# Patient Record
Sex: Female | Born: 1935 | Race: White | Hispanic: No | Smoking: Never smoker
Health system: Southern US, Community
[De-identification: ages and names within clinical notes are randomized; demographics above are authoritative.]

## PROBLEM LIST (undated history)

## (undated) DIAGNOSIS — E7800 Pure hypercholesterolemia, unspecified: Secondary | ICD-10-CM

## (undated) DIAGNOSIS — I6930 Unspecified sequelae of cerebral infarction: Secondary | ICD-10-CM

## (undated) DIAGNOSIS — Z6821 Body mass index (BMI) 21.0-21.9, adult: Secondary | ICD-10-CM

## (undated) DIAGNOSIS — E663 Overweight: Secondary | ICD-10-CM

## (undated) DIAGNOSIS — E785 Hyperlipidemia, unspecified: Secondary | ICD-10-CM

## (undated) DIAGNOSIS — I509 Heart failure, unspecified: Secondary | ICD-10-CM

## (undated) DIAGNOSIS — E119 Type 2 diabetes mellitus without complications: Secondary | ICD-10-CM

## (undated) DIAGNOSIS — M1990 Unspecified osteoarthritis, unspecified site: Secondary | ICD-10-CM

## (undated) DIAGNOSIS — I10 Essential (primary) hypertension: Secondary | ICD-10-CM

## (undated) DIAGNOSIS — M353 Polymyalgia rheumatica: Secondary | ICD-10-CM

## (undated) DIAGNOSIS — M316 Other giant cell arteritis: Secondary | ICD-10-CM

## (undated) HISTORY — DX: Body mass index (BMI) 21.0-21.9, adult: Z68.21

## (undated) HISTORY — DX: Unspecified osteoarthritis, unspecified site: M19.90

## (undated) HISTORY — DX: Heart failure, unspecified: I50.9

## (undated) HISTORY — DX: Hyperlipidemia, unspecified: E78.5

## (undated) HISTORY — DX: Overweight: E66.3

## (undated) HISTORY — DX: Unspecified sequelae of cerebral infarction: I69.30

## (undated) HISTORY — DX: Polymyalgia rheumatica: M35.3

## (undated) HISTORY — DX: Other giant cell arteritis: M31.6

## (undated) HISTORY — DX: Type 2 diabetes mellitus without complications: E11.9

---

## 1987-02-20 HISTORY — PX: ANKLE SURGERY: SHX546

## 1989-02-19 HISTORY — PX: ABDOMINAL HYSTERECTOMY: SHX81

## 2001-05-19 ENCOUNTER — Ambulatory Visit (HOSPITAL_COMMUNITY): Admission: RE | Admit: 2001-05-19 | Discharge: 2001-05-19 | Payer: Self-pay | Admitting: Family Medicine

## 2001-05-19 ENCOUNTER — Encounter: Payer: Self-pay | Admitting: Family Medicine

## 2002-06-10 ENCOUNTER — Ambulatory Visit (HOSPITAL_COMMUNITY): Admission: RE | Admit: 2002-06-10 | Discharge: 2002-06-10 | Payer: Self-pay | Admitting: Family Medicine

## 2002-06-10 ENCOUNTER — Encounter: Payer: Self-pay | Admitting: Family Medicine

## 2003-06-14 ENCOUNTER — Ambulatory Visit (HOSPITAL_COMMUNITY): Admission: RE | Admit: 2003-06-14 | Discharge: 2003-06-14 | Payer: Self-pay | Admitting: Family Medicine

## 2004-11-27 ENCOUNTER — Ambulatory Visit (HOSPITAL_COMMUNITY): Admission: RE | Admit: 2004-11-27 | Discharge: 2004-11-27 | Payer: Self-pay | Admitting: Family Medicine

## 2005-12-13 ENCOUNTER — Ambulatory Visit (HOSPITAL_COMMUNITY): Admission: RE | Admit: 2005-12-13 | Discharge: 2005-12-13 | Payer: Self-pay | Admitting: Family Medicine

## 2006-06-10 ENCOUNTER — Ambulatory Visit (HOSPITAL_COMMUNITY): Admission: RE | Admit: 2006-06-10 | Discharge: 2006-06-10 | Payer: Self-pay | Admitting: Ophthalmology

## 2006-06-10 HISTORY — PX: CATARACT EXTRACTION W/ INTRAOCULAR LENS IMPLANT: SHX1309

## 2006-12-02 ENCOUNTER — Ambulatory Visit (HOSPITAL_COMMUNITY): Admission: RE | Admit: 2006-12-02 | Discharge: 2006-12-02 | Payer: Self-pay | Admitting: Family Medicine

## 2007-02-25 ENCOUNTER — Ambulatory Visit (HOSPITAL_COMMUNITY): Admission: RE | Admit: 2007-02-25 | Discharge: 2007-02-25 | Payer: Self-pay | Admitting: Family Medicine

## 2008-03-10 ENCOUNTER — Ambulatory Visit (HOSPITAL_COMMUNITY): Admission: RE | Admit: 2008-03-10 | Discharge: 2008-03-10 | Payer: Self-pay | Admitting: Family Medicine

## 2009-07-14 ENCOUNTER — Ambulatory Visit (HOSPITAL_COMMUNITY): Admission: RE | Admit: 2009-07-14 | Discharge: 2009-07-14 | Payer: Self-pay | Admitting: Family Medicine

## 2010-03-12 ENCOUNTER — Encounter: Payer: Self-pay | Admitting: Family Medicine

## 2010-06-20 ENCOUNTER — Ambulatory Visit (HOSPITAL_COMMUNITY)
Admission: RE | Admit: 2010-06-20 | Discharge: 2010-06-20 | Disposition: A | Discharge status: Home / Self Care | Payer: Medicare Other | Source: Ambulatory Visit | Attending: General Surgery | Admitting: General Surgery

## 2010-06-20 DIAGNOSIS — Z1211 Encounter for screening for malignant neoplasm of colon: Secondary | ICD-10-CM | POA: Insufficient documentation

## 2010-06-20 DIAGNOSIS — Z79899 Other long term (current) drug therapy: Secondary | ICD-10-CM | POA: Insufficient documentation

## 2010-06-20 DIAGNOSIS — D126 Benign neoplasm of colon, unspecified: Secondary | ICD-10-CM | POA: Insufficient documentation

## 2010-06-20 DIAGNOSIS — E119 Type 2 diabetes mellitus without complications: Secondary | ICD-10-CM | POA: Insufficient documentation

## 2010-06-20 DIAGNOSIS — I10 Essential (primary) hypertension: Secondary | ICD-10-CM | POA: Insufficient documentation

## 2010-06-20 DIAGNOSIS — E785 Hyperlipidemia, unspecified: Secondary | ICD-10-CM | POA: Insufficient documentation

## 2010-06-20 HISTORY — PX: COLONOSCOPY W/ POLYPECTOMY: SHX1380

## 2010-06-20 LAB — GLUCOSE, CAPILLARY: Glucose-Capillary: 157 mg/dL — ABNORMAL HIGH (ref 70–99)

## 2010-06-21 NOTE — Op Note (Signed)
  NAMEOHANA, BIRDWELL NO.:  [id number]  MEDICAL RECORD NO.:  [id number]           PATIENT TYPE:  O  LOCATION:  DAYP                          FACILITY:  [facility]  PHYSICIAN:  Dalia Heading, M.D.  DATE OF BIRTH:  01/27/1935  DATE OF PROCEDURE:  06/20/2010 DATE OF DISCHARGE:                              OPERATIVE REPORT   PREOPERATIVE DIAGNOSIS:  Need for screening colonoscopy.  POSTOPERATIVE DIAGNOSES:  Need for screening colonoscopy, colon polyps in descending colon.  PROCEDURE:  Colonoscopy with snare polypectomy.  SURGEON:  Dalia Heading, MD  ANESTHESIA:  Demerol 50 mg IV and Versed 3 mg IV.  INDICATIONS:  The patient is a 76 year old white female who presents for screening colonoscopy.  The risks and benefits of the procedure including bleeding, infection, and the possibility of perforation were fully explained to the patient, gave informed consent.  PROCEDURE NOTE:  The patient was placed in the left lateral decubitus position after placement of nasal cannula, pulse oximetry, noninvasive blood pressure monitoring.  Demerol and Versed were used throughout the procedure for anesthesia.  A rectal examination was performed which was negative.  The colonoscope was advanced to the cecum with mild difficulty secondary to tortuosity of the colon.  The bowel preparation was adequate.  Confirmation of placement to the cecum was done using transabdominal palpation and landmarks.  The cecum, ascending colon, transverse colon, and proximal descending colon regions were all within normal limits.  No abnormal lesions were noted.  At approximately 30-35 cm from the anus, a small polyp was seen and removed using the snare cautery.  A larger polyp was also seen and removed in the same region.  This was removed using snare cautery.  The were sent to Pathology for further examination.  There was no abnormal bleeding after the polypectomies.  The rest of the descending  colon and rectum were within normal limits.  No abnormal lesions were noted.  The colonoscope was removed from the colon and rectum after evacuation of air.  The patient tolerated the procedure well and was transferred back to PACU in stable condition.  COMPLICATIONS:  None.  SPECIMEN:  Colon polyps x2, 30 to 35 cm from the anus.  RECOMMENDATIONS:  Recommendations are pending from the results of pathology.  A followup colonoscopy will be done in 3 years.     Dalia Heading, M.D.     MAJ/MEDQ  D:  06/20/2010  T:  06/20/2010  Job:  [id number]  cc:   Corrie Mckusick, M.D. Fax: [phone]  Electronically Signed by Franky Macho M.D. on 06/21/2010 02:54:27 PM

## 2010-06-21 NOTE — H&P (Signed)
  NAMESHARI, NATT NO.:  [id number]  MEDICAL RECORD NO.:  [id number]           PATIENT TYPE:  LOCATION:                                 FACILITY:  PHYSICIAN:  Dalia Heading, M.D.  DATE OF BIRTH:  1934/08/25  DATE OF ADMISSION: DATE OF DISCHARGE:  LH                             HISTORY & PHYSICAL   CHIEF COMPLAINT:  Need for screening colonoscopy.  HISTORY OF PRESENT ILLNESS:  The patient is a 76 year old white female, who is referred for endoscopic evaluation.  She needs a colonoscopy for screening purposes.  No abdominal pain, weight loss, nausea, vomiting, diarrhea, constipation, melena, hematochezia have been noted.  She last had a colonoscopy over 12 years ago.  There is no family history of colon carcinoma.  PAST MEDICAL HISTORY:  Includes, hypertension, high cholesterol levels, non-insulin-dependent diabetes mellitus.  PAST SURGICAL HISTORY:  Hysterectomy, ankle surgery.  CURRENT MEDICATIONS:  Norvasc, simvastatin, losartan, metformin.  ALLERGIES:  No known drug allergies.  REVIEW OF SYSTEMS:  The patient denies drinking or smoking.  She denies any other cardiopulmonary difficulties or bleeding disorders.  FAMILY MEDICAL HISTORY:  Noncontributory.  PHYSICAL EXAMINATION:  GENERAL:  The patient is a well-developed, well- nourished white female, in no acute distress. LUNGS:  Clear to auscultation with equal breath sounds bilaterally. HEART:  Reveals regular rate and rhythm without S3, S4, or murmurs. ABDOMEN:  Soft, nontender, nondistended.  No hepatosplenomegaly or masses are noted. RECTAL:  Deferred to the procedure.  IMPRESSION:  Need for screening colonoscopy.  PLAN:  The patient is scheduled for colonoscopy on Jun 20, 2010.  Risks and benefits of the procedure including bleeding and perforation were fully explained to the patient, gave informed consent.     Dalia Heading, M.D.     MAJ/MEDQ  D:  06/06/2010  T:  06/07/2010   Job:  [id number]  cc:   Corrie Mckusick, M.D. Fax: [phone]  Electronically Signed by Franky Macho M.D. on 06/21/2010 02:29:19 PM

## 2010-07-18 ENCOUNTER — Other Ambulatory Visit (HOSPITAL_COMMUNITY): Payer: Self-pay | Admitting: Family Medicine

## 2010-07-18 DIAGNOSIS — Z139 Encounter for screening, unspecified: Secondary | ICD-10-CM

## 2010-07-24 ENCOUNTER — Ambulatory Visit (HOSPITAL_COMMUNITY)
Admission: RE | Admit: 2010-07-24 | Discharge: 2010-07-24 | Disposition: A | Discharge status: Home / Self Care | Payer: Medicare Other | Source: Ambulatory Visit | Attending: Family Medicine | Admitting: Family Medicine

## 2010-07-24 DIAGNOSIS — Z1231 Encounter for screening mammogram for malignant neoplasm of breast: Secondary | ICD-10-CM | POA: Insufficient documentation

## 2010-07-24 DIAGNOSIS — Z139 Encounter for screening, unspecified: Secondary | ICD-10-CM

## 2010-09-12 ENCOUNTER — Encounter (HOSPITAL_COMMUNITY)
Admission: RE | Admit: 2010-09-12 | Discharge: 2010-09-12 | Disposition: A | Discharge status: Home / Self Care | Payer: Medicare Other | Source: Ambulatory Visit | Attending: Ophthalmology | Admitting: Ophthalmology

## 2010-09-12 ENCOUNTER — Encounter (HOSPITAL_COMMUNITY): Payer: Self-pay

## 2010-09-12 ENCOUNTER — Other Ambulatory Visit: Payer: Self-pay

## 2010-09-12 HISTORY — DX: Type 2 diabetes mellitus without complications: E11.9

## 2010-09-12 HISTORY — DX: Pure hypercholesterolemia, unspecified: E78.00

## 2010-09-12 HISTORY — DX: Essential (primary) hypertension: I10

## 2010-09-12 LAB — CBC
HCT: 38.5 % (ref 36.0–46.0)
Hemoglobin: 13.5 g/dL (ref 12.0–15.0)
MCH: 33.8 pg (ref 26.0–34.0)
RBC: 3.99 MIL/uL (ref 3.87–5.11)

## 2010-09-12 LAB — BASIC METABOLIC PANEL
GFR calc non Af Amer: >60 mL/min (ref >60)
Glucose, Bld: 152 mg/dL — ABNORMAL HIGH (ref 70–99)
Potassium: 4.1 mEq/L (ref 3.5–5.1)

## 2010-09-12 MED ORDER — CYCLOPENTOLATE-PHENYLEPHRINE 0.2-1 % OP SOLN
OPHTHALMIC | Status: AC
  Filled 2010-09-12: qty 2

## 2010-09-12 NOTE — Patient Instructions (Addendum)
[name]  09/12/2010   Your procedure is scheduled on:  Monday, 09/18/10  Report to [facility] [place] [facility].  Call this number if you have problems the morning of surgery: [phone]   Remember:   Do not eat food:After Midnight.  Do not drink clear liquids: After Midnight.  Take these medicines the morning of surgery with A SIP OF WATER: norvasc, cozaar   Do not wear jewelry, make-up or nail polish.  Do not bring valuables to the hospital.  Contacts, dentures or bridgework may not be worn into surgery.  Leave suitcase in the car. After surgery it may be brought to your room.  For patients admitted to the hospital, checkout time is 11:00 AM the day of discharge.   Patients discharged the day of surgery will not be allowed to drive home.  Name and phone number of your driver: Lupita Leash, daughter  Special Instructions: Follow instructions on eye drops.   Please read over the following fact sheets that you were given: Pain Booklet and Anesthesia Post-op Instructions   PATIENT INSTRUCTIONS POST-ANESTHESIA  IMMEDIATELY FOLLOWING SURGERY:  Do not drive or operate machinery for the first twenty four hours after surgery.  Do not make any important decisions for twenty four hours after surgery or while taking narcotic pain medications or sedatives.  If you develop intractable nausea and vomiting or a severe headache please notify your doctor immediately.  FOLLOW-UP:  Please make an appointment with your surgeon as instructed. You do not need to follow up with anesthesia unless specifically instructed to do so.  WOUND CARE INSTRUCTIONS (if applicable):  Keep a dry clean dressing on the anesthesia/puncture wound site if there is drainage.  Once the wound has quit draining you may leave it open to air.  Generally you should leave the bandage intact for twenty four hours unless there is drainage.  If the epidural site drains for more than 36-48 hours please call the anesthesia  department.  QUESTIONS?:  Please feel free to call your physician or the hospital operator if you have any questions, and they will be happy to assist you.     [facility] Anesthesia Department [address] [place] [phone]

## 2010-09-18 ENCOUNTER — Ambulatory Visit (HOSPITAL_COMMUNITY)
Admission: RE | Admit: 2010-09-18 | Discharge: 2010-09-18 | Disposition: A | Discharge status: Home / Self Care | Payer: Medicare Other | Source: Ambulatory Visit | Attending: Ophthalmology | Admitting: Ophthalmology

## 2010-09-18 ENCOUNTER — Encounter (HOSPITAL_COMMUNITY): Payer: Self-pay | Admitting: Anesthesiology

## 2010-09-18 ENCOUNTER — Ambulatory Visit (HOSPITAL_COMMUNITY): Payer: Medicare Other | Admitting: Anesthesiology

## 2010-09-18 ENCOUNTER — Encounter (HOSPITAL_COMMUNITY)
Admission: RE | Disposition: A | Discharge status: Home / Self Care | Payer: Self-pay | Source: Ambulatory Visit | Attending: Ophthalmology

## 2010-09-18 DIAGNOSIS — H2510 Age-related nuclear cataract, unspecified eye: Secondary | ICD-10-CM | POA: Insufficient documentation

## 2010-09-18 DIAGNOSIS — E119 Type 2 diabetes mellitus without complications: Secondary | ICD-10-CM | POA: Insufficient documentation

## 2010-09-18 DIAGNOSIS — I10 Essential (primary) hypertension: Secondary | ICD-10-CM | POA: Insufficient documentation

## 2010-09-18 DIAGNOSIS — Z01812 Encounter for preprocedural laboratory examination: Secondary | ICD-10-CM | POA: Insufficient documentation

## 2010-09-18 DIAGNOSIS — Z79899 Other long term (current) drug therapy: Secondary | ICD-10-CM | POA: Insufficient documentation

## 2010-09-18 DIAGNOSIS — Z01810 Encounter for preprocedural cardiovascular examination: Secondary | ICD-10-CM | POA: Insufficient documentation

## 2010-09-18 HISTORY — PX: CATARACT EXTRACTION W/PHACO: SHX586

## 2010-09-18 SURGERY — PHACOEMULSIFICATION, CATARACT, WITH IOL INSERTION
Anesthesia: Monitor Anesthesia Care | Site: Eye | Laterality: Left | Wound class: Clean

## 2010-09-18 MED ORDER — EPINEPHRINE HCL 1 MG/ML IJ SOLN
INTRAMUSCULAR | Status: AC
  Filled 2010-09-18: qty 1

## 2010-09-18 MED ORDER — EPINEPHRINE HCL 1 MG/ML IJ SOLN
INTRAOCULAR | Status: DC | PRN
  Administered 2010-09-18: 08:00:00

## 2010-09-18 MED ORDER — TETRACAINE HCL 0.5 % OP SOLN
1.0000 [drp] | Freq: Once | OPHTHALMIC | Status: AC
  Administered 2010-09-18: 07:00:00 via OPHTHALMIC

## 2010-09-18 MED ORDER — KETOROLAC TROMETHAMINE 0.5 % OP SOLN
1.0000 [drp] | Freq: Once | OPHTHALMIC | Status: AC
  Administered 2010-09-18: 1 [drp] via OPHTHALMIC

## 2010-09-18 MED ORDER — TETRACAINE HCL 0.5 % OP SOLN
OPHTHALMIC | Status: DC | PRN
  Administered 2010-09-18: 1 [drp] via OPHTHALMIC

## 2010-09-18 MED ORDER — BALANCED SALT IO SOLN
INTRAOCULAR | Status: DC | PRN
  Administered 2010-09-18: 15 mL via OPHTHALMIC

## 2010-09-18 MED ORDER — GATIFLOXACIN 0.5 % OP SOLN
OPHTHALMIC | Status: DC | PRN
  Administered 2010-09-18: 1 [drp] via OPHTHALMIC

## 2010-09-18 MED ORDER — NA HYALUR & NA CHOND-NA HYALUR 0.55-0.5 ML IO KIT
PACK | INTRAOCULAR | Status: DC | PRN
  Administered 2010-09-18: 1 via OPHTHALMIC

## 2010-09-18 MED ORDER — LIDOCAINE HCL 3.5 % OP GEL: 1.0000 "application " | Freq: Once | OPHTHALMIC | Status: DC

## 2010-09-18 MED ORDER — TETRACAINE HCL 0.5 % OP SOLN
OPHTHALMIC | Status: AC
  Filled 2010-09-18: qty 2

## 2010-09-18 MED ORDER — LACTATED RINGERS IV SOLN
INTRAVENOUS | Status: DC | PRN
  Administered 2010-09-18: 07:00:00 via INTRAVENOUS

## 2010-09-18 MED ORDER — CYCLOPENTOLATE-PHENYLEPHRINE 0.2-1 % OP SOLN
1.0000 [drp] | Freq: Once | OPHTHALMIC | Status: AC
  Administered 2010-09-18: 1 [drp] via OPHTHALMIC

## 2010-09-18 MED ORDER — LIDOCAINE HCL 3.5 % OP GEL
OPHTHALMIC | Status: AC
  Filled 2010-09-18: qty 5

## 2010-09-18 MED ORDER — MIDAZOLAM HCL 2 MG/2ML IJ SOLN
INTRAMUSCULAR | Status: AC
  Administered 2010-09-18: 2 mg via INTRAVENOUS
  Filled 2010-09-18: qty 2

## 2010-09-18 MED ORDER — MIDAZOLAM HCL 2 MG/2ML IJ SOLN
1.0000 mg | INTRAMUSCULAR | Status: DC | PRN
  Administered 2010-09-18: 2 mg via INTRAVENOUS

## 2010-09-18 MED ORDER — LACTATED RINGERS IV SOLN
INTRAVENOUS | Status: DC
  Administered 2010-09-18: 07:00:00 via INTRAVENOUS

## 2010-09-18 MED ORDER — LIDOCAINE HCL 3.5 % OP GEL
OPHTHALMIC | Status: DC | PRN
  Administered 2010-09-18: 1 via OPHTHALMIC

## 2010-09-18 SURGICAL SUPPLY — 26 items
CAPSULAR TENSION RING-AMO (OPHTHALMIC RELATED) IMPLANT
CLOTH BEACON ORANGE TIMEOUT ST (SAFETY) ×1 IMPLANT
GLOVE BIO SURGEON STRL SZ7.5 (GLOVE) IMPLANT
GLOVE BIOGEL M 6.5 STRL (GLOVE) IMPLANT
GLOVE BIOGEL PI IND STRL 6.5 (GLOVE) IMPLANT
GLOVE BIOGEL PI IND STRL 7.0 (GLOVE) IMPLANT
GLOVE BIOGEL PI INDICATOR 6.5 (GLOVE) ×1
GLOVE BIOGEL PI INDICATOR 7.0 (GLOVE)
GLOVE ECLIPSE 6.5 STRL STRAW (GLOVE) IMPLANT
GLOVE ECLIPSE 7.5 STRL STRAW (GLOVE) IMPLANT
GLOVE EXAM NITRILE LRG STRL (GLOVE) IMPLANT
GLOVE EXAM NITRILE MD LF STRL (GLOVE) ×1 IMPLANT
GLOVE SKINSENSE NS SZ6.5 (GLOVE)
GLOVE SKINSENSE NS SZ7.0 (GLOVE)
GLOVE SKINSENSE STRL SZ6.5 (GLOVE) IMPLANT
GLOVE SKINSENSE STRL SZ7.0 (GLOVE) IMPLANT
KIT VITRECTOMY (OPHTHALMIC RELATED) IMPLANT
PAD ARMBOARD 7.5X6 YLW CONV (MISCELLANEOUS) ×1 IMPLANT
PROC W NO LENS (INTRAOCULAR LENS)
PROC W SPEC LENS (INTRAOCULAR LENS)
PROCESS W NO LENS (INTRAOCULAR LENS) IMPLANT
PROCESS W SPEC LENS (INTRAOCULAR LENS) IMPLANT
RING MALYGIN (MISCELLANEOUS) IMPLANT
SIGHTPATH CAT PROC W REG LENS (Ophthalmic Related) ×2 IMPLANT
VISCOELASTIC ADDITIONAL (OPHTHALMIC RELATED) IMPLANT
WATER STERILE IRR 250ML POUR (IV SOLUTION) ×1 IMPLANT

## 2010-09-18 NOTE — Brief Op Note (Signed)
09/18/2010  9:08 AM  PATIENT:  Jenna Long  76 y.o. female  PRE-OPERATIVE DIAGNOSIS:  nuclear cataract left eye  POST-OPERATIVE DIAGNOSIS:  nuclear cataract left eye                  CDE :48.54  PROCEDURE:  Procedure(s): CATARACT EXTRACTION PHACO AND INTRAOCULAR LENS PLACEMENT (IOC)  SURGEON:  Surgeon(s): Susa Simmonds  PHYSICIAN ASSISTANT:   ASSISTANTS: none   ANESTHESIA:   local  ESTIMATED BLOOD LOSS: * No blood loss amount entered *   BLOOD ADMINISTERED:none  DRAINS: none   LOCAL MEDICATIONS USED:  LIDOCAINE  0.1 mL SPECIMEN:  No Specimen  DISPOSITION OF SPECIMEN:  N/A  COUNTS:  YES  TOURNIQUET:  * No tourniquets in log *  DICTATION #: see scanned op note  PLAN OF CARE: see printed material  PATIENT DISPOSITION:  Short Stay   Delay start of Pharmacological VTE agent (>24hrs) due to surgical blood loss or risk of bleeding:  no

## 2010-09-18 NOTE — Anesthesia Postprocedure Evaluation (Signed)
  Anesthesia Post-op Note  Patient: Jenna Long  Procedure(s) Performed:  CATARACT EXTRACTION PHACO AND INTRAOCULAR LENS PLACEMENT (IOC)  Patient Location: PACU and Short Stay  Anesthesia Type: MAC  Level of Consciousness: awake, alert , oriented and patient cooperative  Airway and Oxygen Therapy: Patient Spontanous Breathing  Post-op Pain: none  Post-op Assessment: Patient's Cardiovascular Status Stable, Respiratory Function Stable, No signs of Nausea or vomiting and Pain level controlled  Post-op Vital Signs: stable  Complications: No apparent anesthesia complications

## 2010-09-18 NOTE — Anesthesia Preprocedure Evaluation (Signed)
Anesthesia Evaluation  Name, MR# and DOB Patient awake  General Assessment Comment  Reviewed: Allergy & Precautions, H&P  and Patient's Chart, lab work & pertinent test results  History of Anesthesia Complications Negative for: history of anesthetic complications  Airway Mallampati: II  Neck ROM: Full    Dental  (+) Teeth Intact   Pulmonary    pulmonary exam normal   Cardiovascular hypertension, Pt. on medications Regular Normal   Neuro/Psych  GI/Hepatic/Renal   Endo/Other   (+) Diabetes mellitus- (cbg=213), Well Controlled, Type 2  Abdominal   Musculoskeletal  Hematology   Peds  Reproductive/Obstetrics   Anesthesia Other Findings             Anesthesia Physical Anesthesia Plan  ASA: II  Anesthesia Plan: MAC   Post-op Pain Management:    Induction:   Airway Management Planned: Nasal Cannula  Additional Equipment:   Intra-op Plan:   Post-operative Plan:   Informed Consent: I have reviewed the patients History and Physical, chart, labs and discussed the procedure including the risks, benefits and alternatives for the proposed anesthesia with the patient or authorized representative who has indicated his/her understanding and acceptance.     Plan Discussed with:   Anesthesia Plan Comments:         Anesthesia Quick Evaluation

## 2010-09-18 NOTE — Anesthesia Procedure Notes (Signed)
Procedures

## 2010-09-18 NOTE — Transfer of Care (Signed)
Immediate Anesthesia Transfer of Care Note  Patient: Jenna Long  Procedure(s) Performed:  CATARACT EXTRACTION PHACO AND INTRAOCULAR LENS PLACEMENT (IOC)  Patient Location: PACU and Short Stay  Anesthesia Type: MAC  Level of Consciousness: awake, alert , oriented and patient cooperative  Airway & Oxygen Therapy: Patient Spontanous Breathing  Post-op Assessment: Report given to PACU RN, Post -op Vital signs reviewed and stable and Patient moving all extremities X 4  Post vital signs: stable  Complications: No apparent anesthesia complications

## 2010-09-20 NOTE — Addendum Note (Signed)
Addendum  created 09/20/10 1115 by Despina Hidden   Modules edited:Charges VN

## 2010-10-02 ENCOUNTER — Encounter (HOSPITAL_COMMUNITY): Payer: Self-pay | Admitting: Ophthalmology

## 2011-08-14 ENCOUNTER — Other Ambulatory Visit (HOSPITAL_COMMUNITY): Payer: Self-pay | Admitting: Family Medicine

## 2011-08-14 DIAGNOSIS — Z139 Encounter for screening, unspecified: Secondary | ICD-10-CM

## 2011-08-16 ENCOUNTER — Ambulatory Visit (HOSPITAL_COMMUNITY)
Admission: RE | Admit: 2011-08-16 | Discharge: 2011-08-16 | Disposition: A | Discharge status: Home / Self Care | Payer: Medicare Other | Source: Ambulatory Visit | Attending: Family Medicine | Admitting: Family Medicine

## 2011-08-16 DIAGNOSIS — Z139 Encounter for screening, unspecified: Secondary | ICD-10-CM

## 2011-08-16 DIAGNOSIS — Z1231 Encounter for screening mammogram for malignant neoplasm of breast: Secondary | ICD-10-CM | POA: Insufficient documentation

## 2012-11-04 ENCOUNTER — Other Ambulatory Visit (HOSPITAL_COMMUNITY): Payer: Self-pay | Admitting: Family Medicine

## 2012-11-04 DIAGNOSIS — Z139 Encounter for screening, unspecified: Secondary | ICD-10-CM

## 2012-11-06 ENCOUNTER — Ambulatory Visit (HOSPITAL_COMMUNITY)
Admission: RE | Admit: 2012-11-06 | Discharge: 2012-11-06 | Disposition: A | Discharge status: Home / Self Care | Payer: Medicare Other | Source: Ambulatory Visit | Attending: Family Medicine | Admitting: Family Medicine

## 2012-11-06 DIAGNOSIS — Z139 Encounter for screening, unspecified: Secondary | ICD-10-CM

## 2012-11-06 DIAGNOSIS — Z1231 Encounter for screening mammogram for malignant neoplasm of breast: Secondary | ICD-10-CM | POA: Insufficient documentation

## 2013-11-23 ENCOUNTER — Other Ambulatory Visit (HOSPITAL_COMMUNITY): Payer: Self-pay | Admitting: Family Medicine

## 2013-11-23 DIAGNOSIS — Z1231 Encounter for screening mammogram for malignant neoplasm of breast: Secondary | ICD-10-CM

## 2013-11-26 ENCOUNTER — Ambulatory Visit (HOSPITAL_COMMUNITY)
Admission: RE | Admit: 2013-11-26 | Discharge: 2013-11-26 | Disposition: A | Discharge status: Home / Self Care | Payer: Medicare HMO | Source: Ambulatory Visit | Attending: Family Medicine | Admitting: Family Medicine

## 2013-11-26 DIAGNOSIS — Z1231 Encounter for screening mammogram for malignant neoplasm of breast: Secondary | ICD-10-CM | POA: Insufficient documentation

## 2015-01-24 ENCOUNTER — Other Ambulatory Visit (HOSPITAL_COMMUNITY): Payer: Self-pay | Admitting: Family Medicine

## 2015-01-24 DIAGNOSIS — Z1231 Encounter for screening mammogram for malignant neoplasm of breast: Secondary | ICD-10-CM

## 2015-01-27 ENCOUNTER — Ambulatory Visit (HOSPITAL_COMMUNITY)
Admission: RE | Admit: 2015-01-27 | Discharge: 2015-01-27 | Disposition: A | Discharge status: Home / Self Care | Payer: PPO | Source: Ambulatory Visit | Attending: Family Medicine | Admitting: Family Medicine

## 2015-01-27 DIAGNOSIS — Z1231 Encounter for screening mammogram for malignant neoplasm of breast: Secondary | ICD-10-CM | POA: Insufficient documentation

## 2015-03-03 DIAGNOSIS — E782 Mixed hyperlipidemia: Secondary | ICD-10-CM | POA: Diagnosis not present

## 2015-03-03 DIAGNOSIS — Z23 Encounter for immunization: Secondary | ICD-10-CM | POA: Diagnosis not present

## 2015-03-03 DIAGNOSIS — I10 Essential (primary) hypertension: Secondary | ICD-10-CM | POA: Diagnosis not present

## 2015-03-03 DIAGNOSIS — Z1389 Encounter for screening for other disorder: Secondary | ICD-10-CM | POA: Diagnosis not present

## 2015-03-03 DIAGNOSIS — E1165 Type 2 diabetes mellitus with hyperglycemia: Secondary | ICD-10-CM | POA: Diagnosis not present

## 2015-03-03 DIAGNOSIS — Z6827 Body mass index (BMI) 27.0-27.9, adult: Secondary | ICD-10-CM | POA: Diagnosis not present

## 2015-03-03 DIAGNOSIS — E663 Overweight: Secondary | ICD-10-CM | POA: Diagnosis not present

## 2015-04-11 DIAGNOSIS — Z6827 Body mass index (BMI) 27.0-27.9, adult: Secondary | ICD-10-CM | POA: Diagnosis not present

## 2015-04-11 DIAGNOSIS — Z1389 Encounter for screening for other disorder: Secondary | ICD-10-CM | POA: Diagnosis not present

## 2015-04-11 DIAGNOSIS — Z0000 Encounter for general adult medical examination without abnormal findings: Secondary | ICD-10-CM | POA: Diagnosis not present

## 2015-04-11 DIAGNOSIS — E119 Type 2 diabetes mellitus without complications: Secondary | ICD-10-CM | POA: Diagnosis not present

## 2015-04-12 DIAGNOSIS — Z0000 Encounter for general adult medical examination without abnormal findings: Secondary | ICD-10-CM | POA: Diagnosis not present

## 2015-04-12 DIAGNOSIS — Z1389 Encounter for screening for other disorder: Secondary | ICD-10-CM | POA: Diagnosis not present

## 2015-04-12 DIAGNOSIS — E119 Type 2 diabetes mellitus without complications: Secondary | ICD-10-CM | POA: Diagnosis not present

## 2015-04-12 DIAGNOSIS — Z6827 Body mass index (BMI) 27.0-27.9, adult: Secondary | ICD-10-CM | POA: Diagnosis not present

## 2015-05-26 DIAGNOSIS — E1165 Type 2 diabetes mellitus with hyperglycemia: Secondary | ICD-10-CM | POA: Diagnosis not present

## 2015-05-30 DIAGNOSIS — E119 Type 2 diabetes mellitus without complications: Secondary | ICD-10-CM | POA: Diagnosis not present

## 2015-05-30 DIAGNOSIS — Z961 Presence of intraocular lens: Secondary | ICD-10-CM | POA: Diagnosis not present

## 2015-09-01 DIAGNOSIS — E782 Mixed hyperlipidemia: Secondary | ICD-10-CM | POA: Diagnosis not present

## 2015-09-01 DIAGNOSIS — Z1389 Encounter for screening for other disorder: Secondary | ICD-10-CM | POA: Diagnosis not present

## 2015-09-01 DIAGNOSIS — E663 Overweight: Secondary | ICD-10-CM | POA: Diagnosis not present

## 2015-09-01 DIAGNOSIS — E1165 Type 2 diabetes mellitus with hyperglycemia: Secondary | ICD-10-CM | POA: Diagnosis not present

## 2015-09-01 DIAGNOSIS — Z6827 Body mass index (BMI) 27.0-27.9, adult: Secondary | ICD-10-CM | POA: Diagnosis not present

## 2015-09-01 DIAGNOSIS — I10 Essential (primary) hypertension: Secondary | ICD-10-CM | POA: Diagnosis not present

## 2016-02-24 ENCOUNTER — Other Ambulatory Visit (HOSPITAL_COMMUNITY): Payer: Self-pay | Admitting: Family Medicine

## 2016-02-24 DIAGNOSIS — Z1231 Encounter for screening mammogram for malignant neoplasm of breast: Secondary | ICD-10-CM

## 2016-03-01 ENCOUNTER — Ambulatory Visit (HOSPITAL_COMMUNITY)
Admission: RE | Admit: 2016-03-01 | Discharge: 2016-03-01 | Disposition: A | Discharge status: Home / Self Care | Payer: PPO | Source: Ambulatory Visit | Attending: Family Medicine | Admitting: Family Medicine

## 2016-03-01 DIAGNOSIS — Z1231 Encounter for screening mammogram for malignant neoplasm of breast: Secondary | ICD-10-CM | POA: Insufficient documentation

## 2016-03-06 DIAGNOSIS — E782 Mixed hyperlipidemia: Secondary | ICD-10-CM | POA: Diagnosis not present

## 2016-03-06 DIAGNOSIS — Z6826 Body mass index (BMI) 26.0-26.9, adult: Secondary | ICD-10-CM | POA: Diagnosis not present

## 2016-03-06 DIAGNOSIS — E119 Type 2 diabetes mellitus without complications: Secondary | ICD-10-CM | POA: Diagnosis not present

## 2016-03-06 DIAGNOSIS — Z1389 Encounter for screening for other disorder: Secondary | ICD-10-CM | POA: Diagnosis not present

## 2016-03-06 DIAGNOSIS — E663 Overweight: Secondary | ICD-10-CM | POA: Diagnosis not present

## 2016-03-06 DIAGNOSIS — Z23 Encounter for immunization: Secondary | ICD-10-CM | POA: Diagnosis not present

## 2016-03-06 DIAGNOSIS — I10 Essential (primary) hypertension: Secondary | ICD-10-CM | POA: Diagnosis not present

## 2016-04-23 DIAGNOSIS — E119 Type 2 diabetes mellitus without complications: Secondary | ICD-10-CM | POA: Diagnosis not present

## 2016-04-23 DIAGNOSIS — Z1389 Encounter for screening for other disorder: Secondary | ICD-10-CM | POA: Diagnosis not present

## 2016-04-23 DIAGNOSIS — Z0000 Encounter for general adult medical examination without abnormal findings: Secondary | ICD-10-CM | POA: Diagnosis not present

## 2016-04-23 DIAGNOSIS — E663 Overweight: Secondary | ICD-10-CM | POA: Diagnosis not present

## 2016-04-23 DIAGNOSIS — Z6827 Body mass index (BMI) 27.0-27.9, adult: Secondary | ICD-10-CM | POA: Diagnosis not present

## 2016-06-28 ENCOUNTER — Other Ambulatory Visit (HOSPITAL_COMMUNITY): Payer: Self-pay | Admitting: Family Medicine

## 2016-06-28 DIAGNOSIS — E2839 Other primary ovarian failure: Secondary | ICD-10-CM

## 2016-08-03 ENCOUNTER — Ambulatory Visit (HOSPITAL_COMMUNITY)
Admission: RE | Admit: 2016-08-03 | Discharge: 2016-08-03 | Disposition: A | Discharge status: Home / Self Care | Payer: PPO | Source: Ambulatory Visit | Attending: Family Medicine | Admitting: Family Medicine

## 2016-08-03 DIAGNOSIS — Z1389 Encounter for screening for other disorder: Secondary | ICD-10-CM | POA: Diagnosis not present

## 2016-08-03 DIAGNOSIS — M8589 Other specified disorders of bone density and structure, multiple sites: Secondary | ICD-10-CM | POA: Diagnosis not present

## 2016-08-03 DIAGNOSIS — E2839 Other primary ovarian failure: Secondary | ICD-10-CM

## 2016-08-03 DIAGNOSIS — M85852 Other specified disorders of bone density and structure, left thigh: Secondary | ICD-10-CM | POA: Insufficient documentation

## 2016-08-03 DIAGNOSIS — Z780 Asymptomatic menopausal state: Secondary | ICD-10-CM | POA: Diagnosis not present

## 2016-11-10 DIAGNOSIS — E1165 Type 2 diabetes mellitus with hyperglycemia: Secondary | ICD-10-CM | POA: Diagnosis not present

## 2016-11-15 DIAGNOSIS — Z961 Presence of intraocular lens: Secondary | ICD-10-CM | POA: Diagnosis not present

## 2016-11-15 DIAGNOSIS — E113293 Type 2 diabetes mellitus with mild nonproliferative diabetic retinopathy without macular edema, bilateral: Secondary | ICD-10-CM | POA: Diagnosis not present

## 2017-01-02 DIAGNOSIS — E663 Overweight: Secondary | ICD-10-CM | POA: Diagnosis not present

## 2017-01-02 DIAGNOSIS — Z23 Encounter for immunization: Secondary | ICD-10-CM | POA: Diagnosis not present

## 2017-01-02 DIAGNOSIS — I10 Essential (primary) hypertension: Secondary | ICD-10-CM | POA: Diagnosis not present

## 2017-01-02 DIAGNOSIS — Z6826 Body mass index (BMI) 26.0-26.9, adult: Secondary | ICD-10-CM | POA: Diagnosis not present

## 2017-01-02 DIAGNOSIS — Z1389 Encounter for screening for other disorder: Secondary | ICD-10-CM | POA: Diagnosis not present

## 2017-01-02 DIAGNOSIS — E782 Mixed hyperlipidemia: Secondary | ICD-10-CM | POA: Diagnosis not present

## 2017-01-02 DIAGNOSIS — E119 Type 2 diabetes mellitus without complications: Secondary | ICD-10-CM | POA: Diagnosis not present

## 2017-04-11 DIAGNOSIS — E785 Hyperlipidemia, unspecified: Secondary | ICD-10-CM | POA: Diagnosis not present

## 2017-04-11 DIAGNOSIS — Z1389 Encounter for screening for other disorder: Secondary | ICD-10-CM | POA: Diagnosis not present

## 2017-04-11 DIAGNOSIS — E1165 Type 2 diabetes mellitus with hyperglycemia: Secondary | ICD-10-CM | POA: Diagnosis not present

## 2017-04-11 DIAGNOSIS — E782 Mixed hyperlipidemia: Secondary | ICD-10-CM | POA: Diagnosis not present

## 2017-04-11 DIAGNOSIS — Z6827 Body mass index (BMI) 27.0-27.9, adult: Secondary | ICD-10-CM | POA: Diagnosis not present

## 2017-04-11 DIAGNOSIS — I10 Essential (primary) hypertension: Secondary | ICD-10-CM | POA: Diagnosis not present

## 2017-04-22 ENCOUNTER — Other Ambulatory Visit (HOSPITAL_COMMUNITY): Payer: Self-pay | Admitting: Family Medicine

## 2017-04-22 DIAGNOSIS — Z1231 Encounter for screening mammogram for malignant neoplasm of breast: Secondary | ICD-10-CM

## 2017-04-25 ENCOUNTER — Ambulatory Visit (HOSPITAL_COMMUNITY)
Admission: RE | Admit: 2017-04-25 | Discharge: 2017-04-25 | Disposition: A | Discharge status: Home / Self Care | Payer: PPO | Source: Ambulatory Visit | Attending: Family Medicine | Admitting: Family Medicine

## 2017-04-25 ENCOUNTER — Ambulatory Visit (HOSPITAL_COMMUNITY): Payer: PPO

## 2017-04-25 DIAGNOSIS — Z1231 Encounter for screening mammogram for malignant neoplasm of breast: Secondary | ICD-10-CM | POA: Insufficient documentation

## 2017-07-30 DIAGNOSIS — Z23 Encounter for immunization: Secondary | ICD-10-CM | POA: Diagnosis not present

## 2017-07-30 DIAGNOSIS — Z0001 Encounter for general adult medical examination with abnormal findings: Secondary | ICD-10-CM | POA: Diagnosis not present

## 2017-07-30 DIAGNOSIS — E663 Overweight: Secondary | ICD-10-CM | POA: Diagnosis not present

## 2017-07-30 DIAGNOSIS — E782 Mixed hyperlipidemia: Secondary | ICD-10-CM | POA: Diagnosis not present

## 2017-07-30 DIAGNOSIS — Z6827 Body mass index (BMI) 27.0-27.9, adult: Secondary | ICD-10-CM | POA: Diagnosis not present

## 2017-07-30 DIAGNOSIS — E119 Type 2 diabetes mellitus without complications: Secondary | ICD-10-CM | POA: Diagnosis not present

## 2017-07-30 DIAGNOSIS — I10 Essential (primary) hypertension: Secondary | ICD-10-CM | POA: Diagnosis not present

## 2017-07-30 DIAGNOSIS — Z1389 Encounter for screening for other disorder: Secondary | ICD-10-CM | POA: Diagnosis not present

## 2017-12-20 DIAGNOSIS — E1165 Type 2 diabetes mellitus with hyperglycemia: Secondary | ICD-10-CM | POA: Diagnosis not present

## 2017-12-20 DIAGNOSIS — E663 Overweight: Secondary | ICD-10-CM | POA: Diagnosis not present

## 2017-12-20 DIAGNOSIS — Z1389 Encounter for screening for other disorder: Secondary | ICD-10-CM | POA: Diagnosis not present

## 2017-12-20 DIAGNOSIS — Z23 Encounter for immunization: Secondary | ICD-10-CM | POA: Diagnosis not present

## 2017-12-20 DIAGNOSIS — I10 Essential (primary) hypertension: Secondary | ICD-10-CM | POA: Diagnosis not present

## 2017-12-20 DIAGNOSIS — R42 Dizziness and giddiness: Secondary | ICD-10-CM | POA: Diagnosis not present

## 2017-12-20 DIAGNOSIS — Z6827 Body mass index (BMI) 27.0-27.9, adult: Secondary | ICD-10-CM | POA: Diagnosis not present

## 2018-02-14 DIAGNOSIS — E119 Type 2 diabetes mellitus without complications: Secondary | ICD-10-CM | POA: Diagnosis not present

## 2018-03-14 ENCOUNTER — Ambulatory Visit (HOSPITAL_COMMUNITY)
Admission: RE | Admit: 2018-03-14 | Discharge: 2018-03-14 | Disposition: A | Discharge status: Home / Self Care | Payer: PPO | Source: Ambulatory Visit | Attending: Family Medicine | Admitting: Family Medicine

## 2018-03-14 ENCOUNTER — Other Ambulatory Visit (HOSPITAL_COMMUNITY): Payer: Self-pay | Admitting: Family Medicine

## 2018-03-14 DIAGNOSIS — I10 Essential (primary) hypertension: Secondary | ICD-10-CM | POA: Diagnosis not present

## 2018-03-14 DIAGNOSIS — E7841 Elevated Lipoprotein(a): Secondary | ICD-10-CM | POA: Diagnosis not present

## 2018-03-14 DIAGNOSIS — M25571 Pain in right ankle and joints of right foot: Secondary | ICD-10-CM

## 2018-03-14 DIAGNOSIS — E663 Overweight: Secondary | ICD-10-CM | POA: Diagnosis not present

## 2018-03-14 DIAGNOSIS — R2241 Localized swelling, mass and lump, right lower limb: Secondary | ICD-10-CM | POA: Diagnosis not present

## 2018-03-14 DIAGNOSIS — Z1389 Encounter for screening for other disorder: Secondary | ICD-10-CM | POA: Diagnosis not present

## 2018-03-14 DIAGNOSIS — Z0001 Encounter for general adult medical examination with abnormal findings: Secondary | ICD-10-CM | POA: Diagnosis not present

## 2018-03-14 DIAGNOSIS — E1165 Type 2 diabetes mellitus with hyperglycemia: Secondary | ICD-10-CM | POA: Diagnosis not present

## 2018-03-14 DIAGNOSIS — Z6828 Body mass index (BMI) 28.0-28.9, adult: Secondary | ICD-10-CM | POA: Diagnosis not present

## 2018-03-14 IMAGING — DX DG ANKLE COMPLETE 3+V*R*
3 series · 3 of 3 positions shown · non-contrast
Comparison: None

CLINICAL DATA: RIGHT ankle pain and swelling for 2 weeks, history
hypertension, diabetes mellitus

EXAM:
RIGHT ANKLE - COMPLETE 3+ VIEW

[ankle ap]
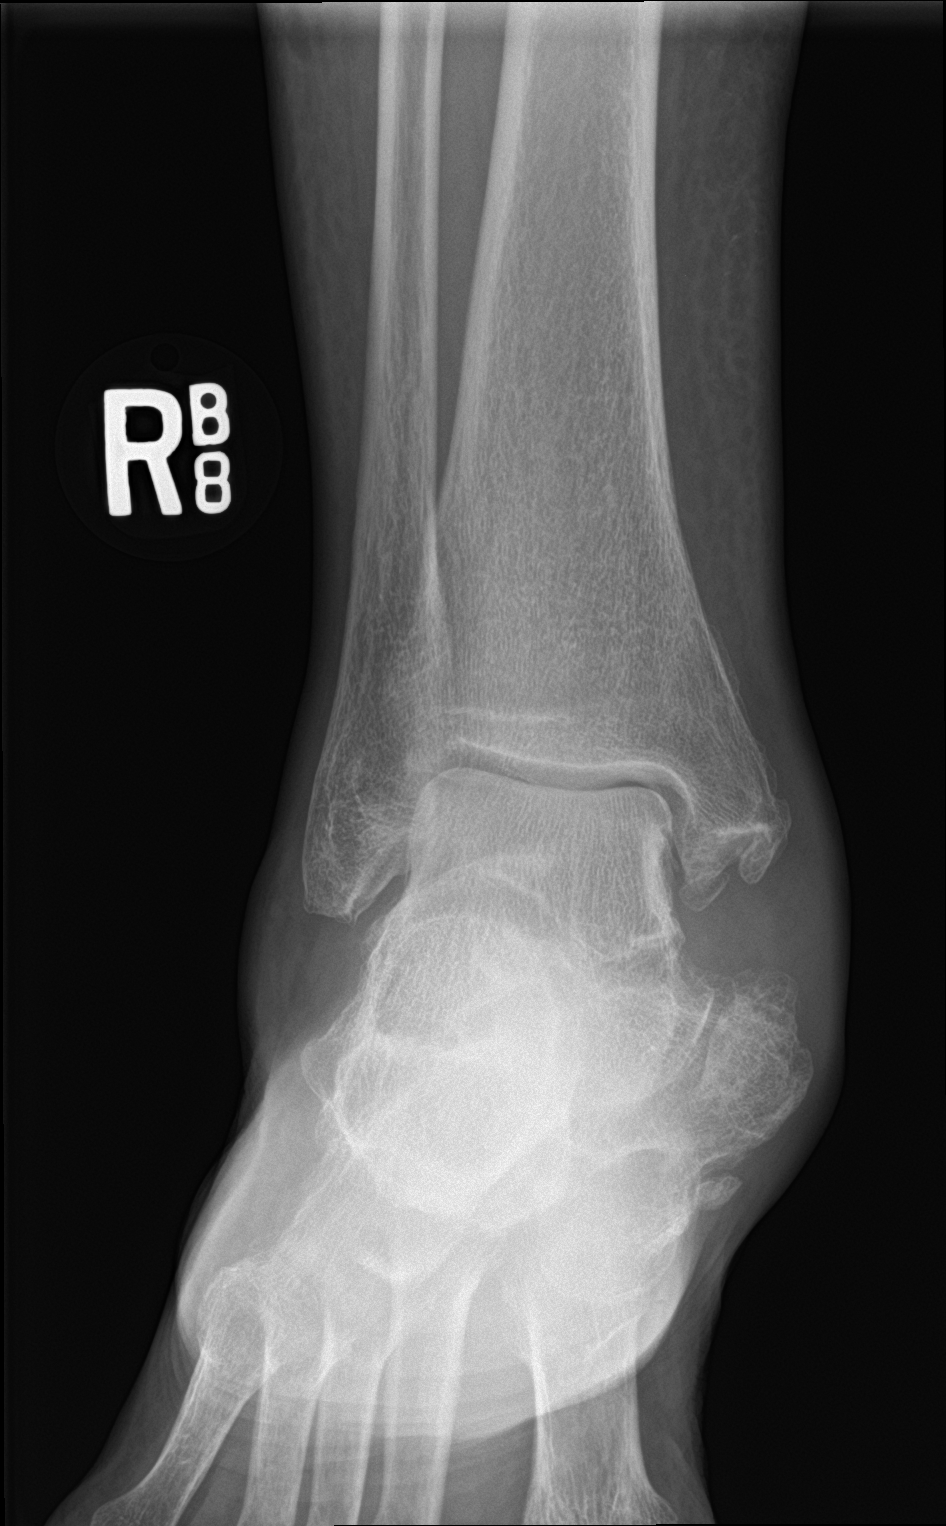

[ankle obl]
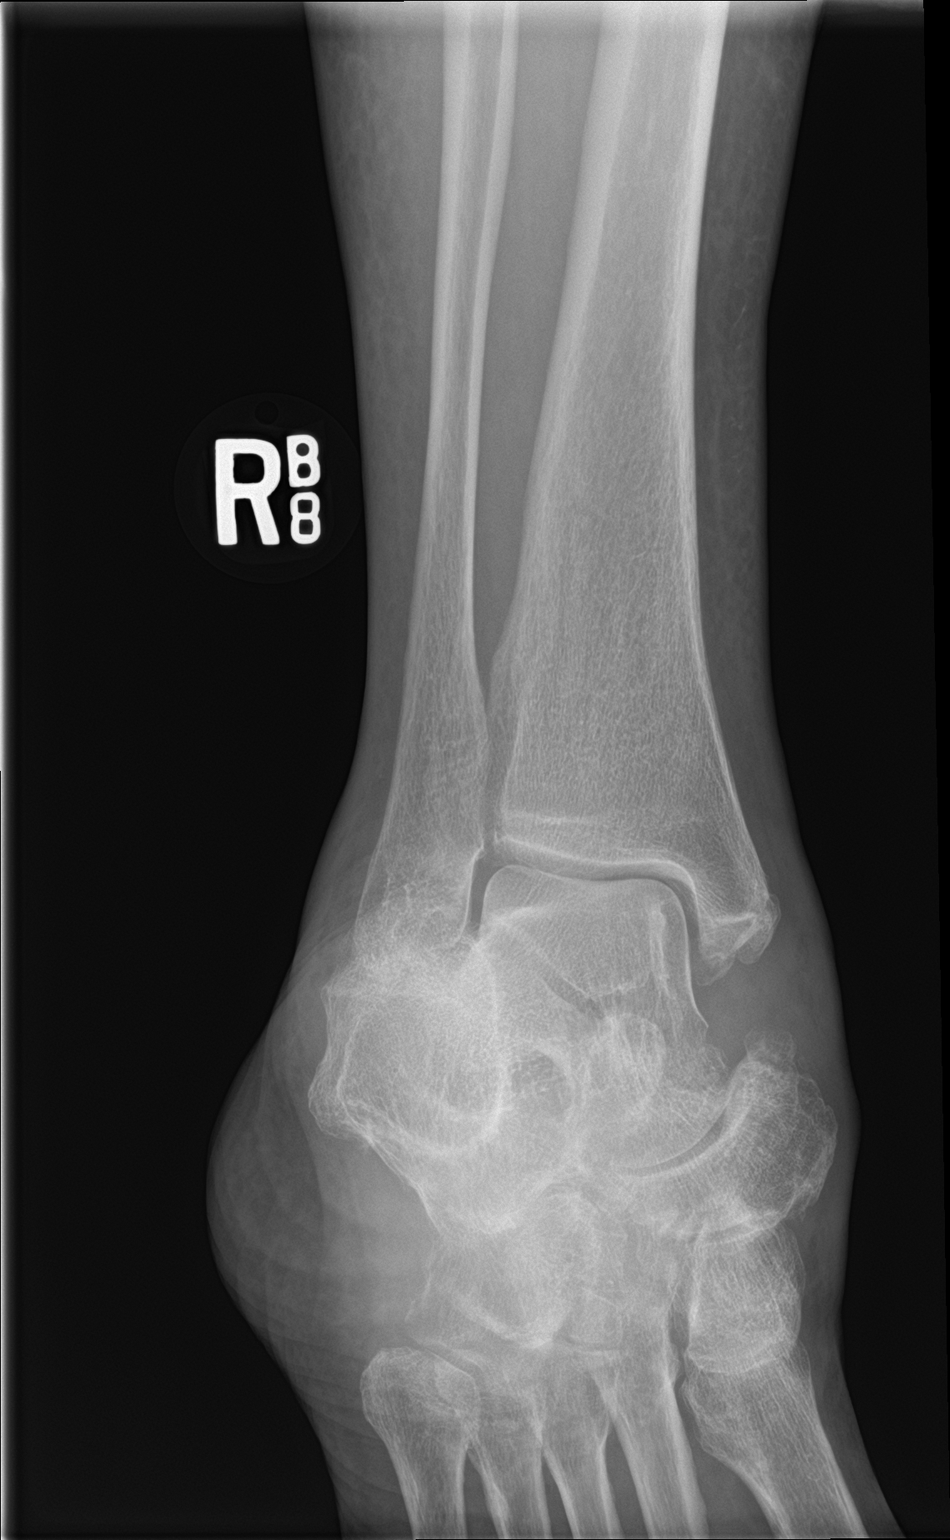

[ankle lat]
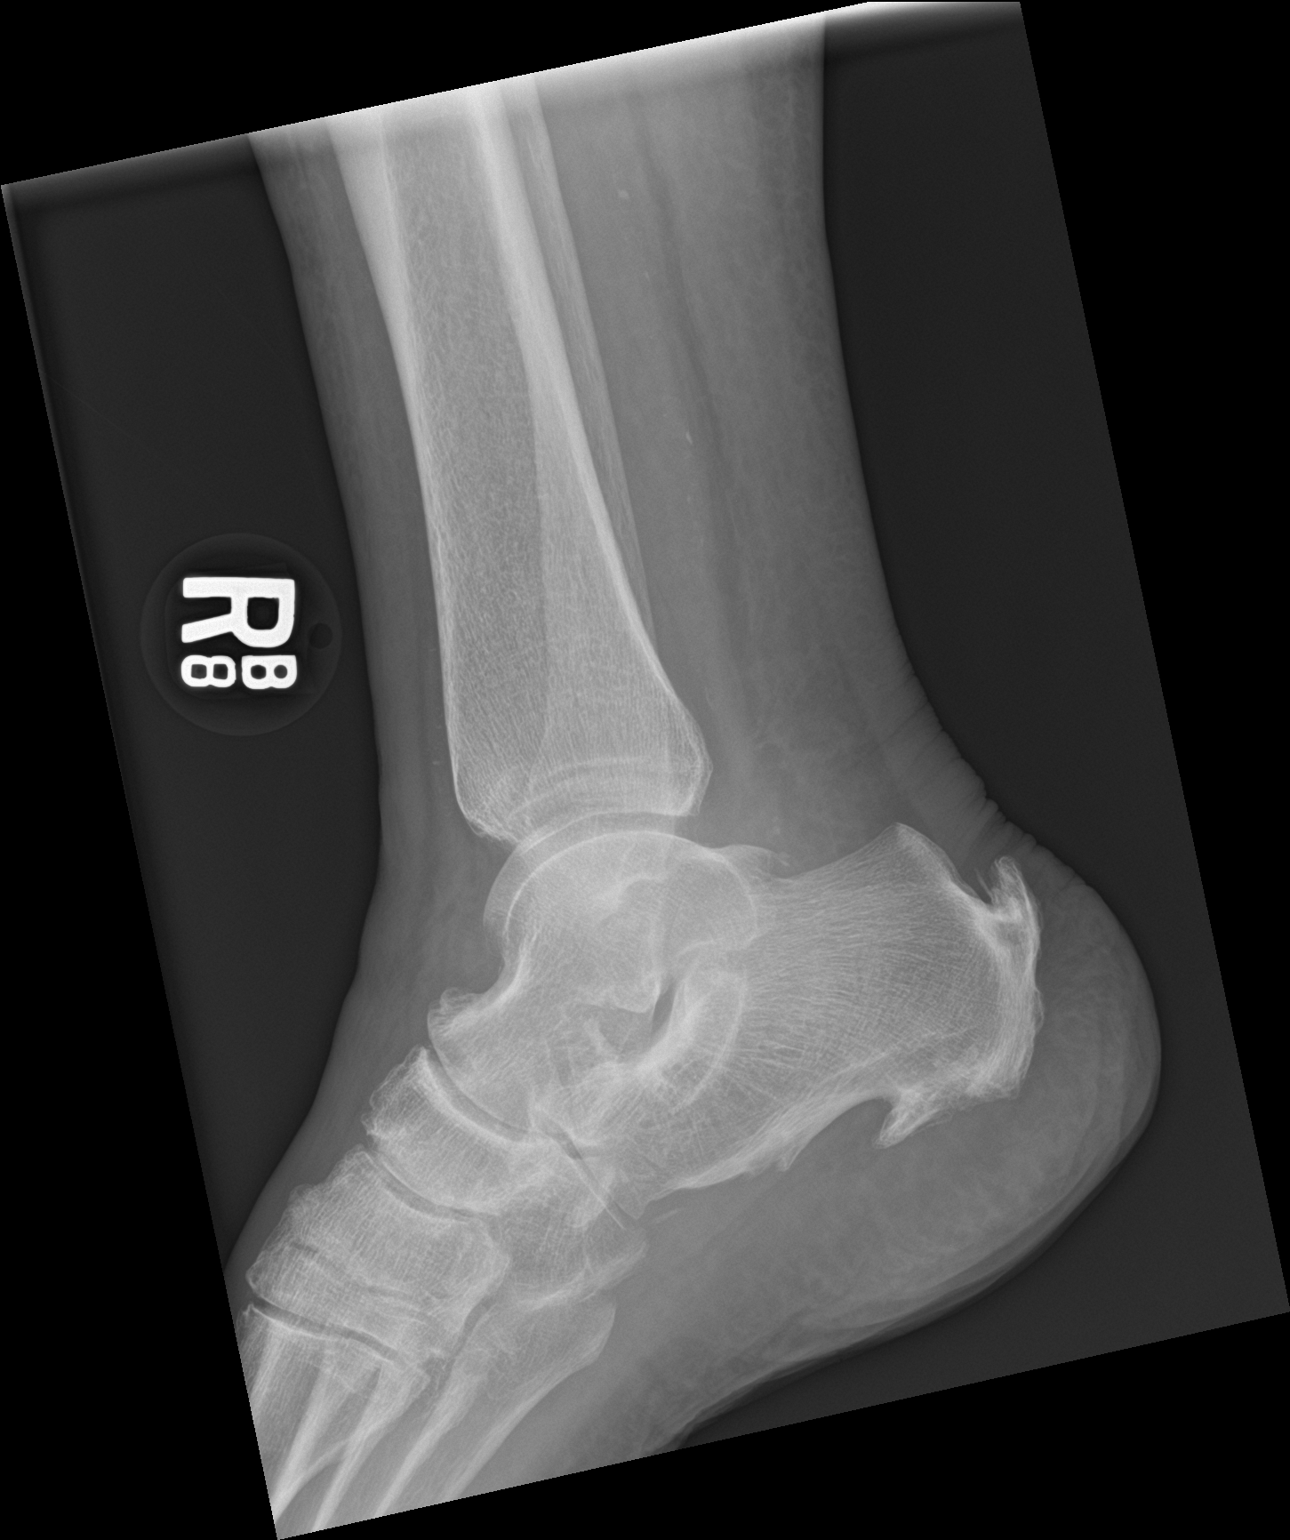

[3 of 3 positions shown; findings below may reference images not displayed]

FINDINGS: Osseous demineralization.

Soft tissue swelling RIGHT ankle greatest medially.

Large plantar and Achilles insertion calcaneal spurs.

Spurring noted at dorsal margin of tarsal navicular and at medial
malleolus.

No acute fracture, dislocation, or bone destruction.
IMPRESSION: Degenerative changes and scattered soft tissue swelling.

Calcaneal spurring.

No acute osseous abnormalities.

## 2018-07-25 ENCOUNTER — Other Ambulatory Visit (HOSPITAL_COMMUNITY): Payer: Self-pay | Admitting: Family Medicine

## 2018-07-25 DIAGNOSIS — Z1231 Encounter for screening mammogram for malignant neoplasm of breast: Secondary | ICD-10-CM

## 2018-07-31 ENCOUNTER — Ambulatory Visit (HOSPITAL_COMMUNITY)
Admission: RE | Admit: 2018-07-31 | Discharge: 2018-07-31 | Disposition: A | Discharge status: Home / Self Care | Payer: PPO | Source: Ambulatory Visit | Attending: Family Medicine | Admitting: Family Medicine

## 2018-07-31 ENCOUNTER — Other Ambulatory Visit: Payer: Self-pay

## 2018-07-31 DIAGNOSIS — Z1231 Encounter for screening mammogram for malignant neoplasm of breast: Secondary | ICD-10-CM | POA: Insufficient documentation

## 2018-07-31 IMAGING — MG DIGITAL SCREENING BILATERAL MAMMOGRAM WITH TOMO AND CAD
6 of 12 series · 6 of 36 positions shown · non-contrast
Comparison: Previous exam(s).

CLINICAL DATA: Screening.

EXAM:
DIGITAL SCREENING BILATERAL MAMMOGRAM WITH TOMO AND CAD

[R MLO synth-2D (1 of 2)]
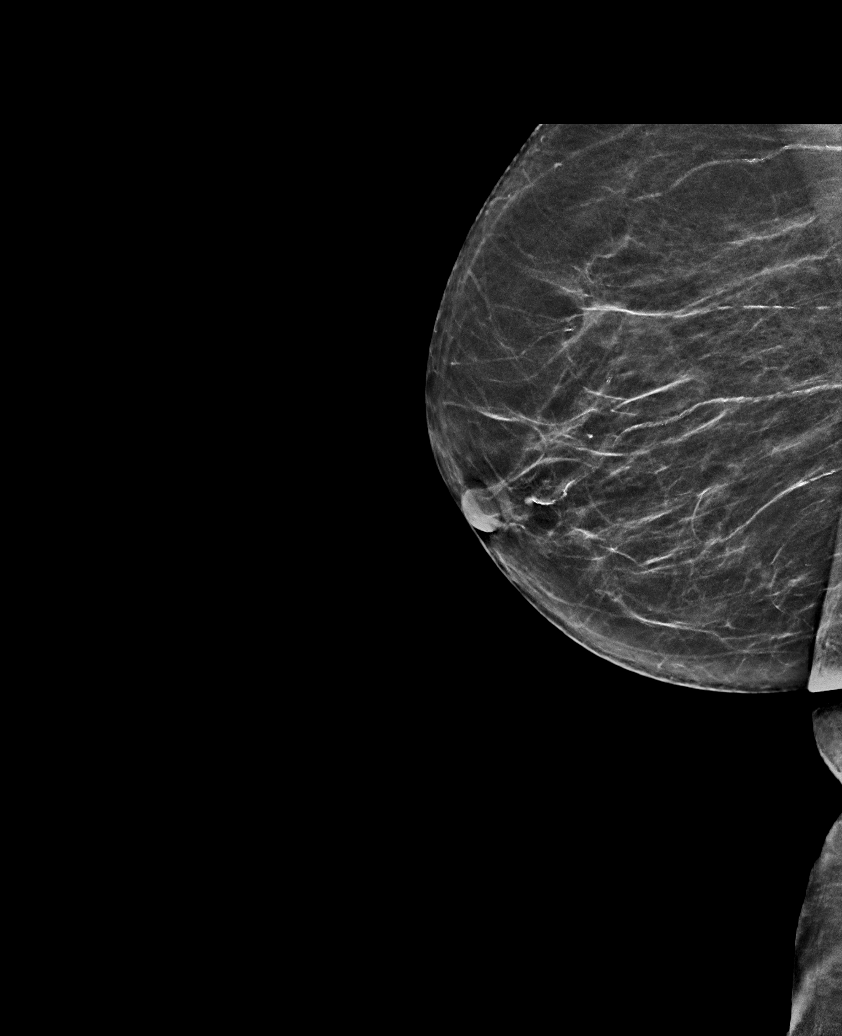

[R MLO synth-2D (2 of 2)]
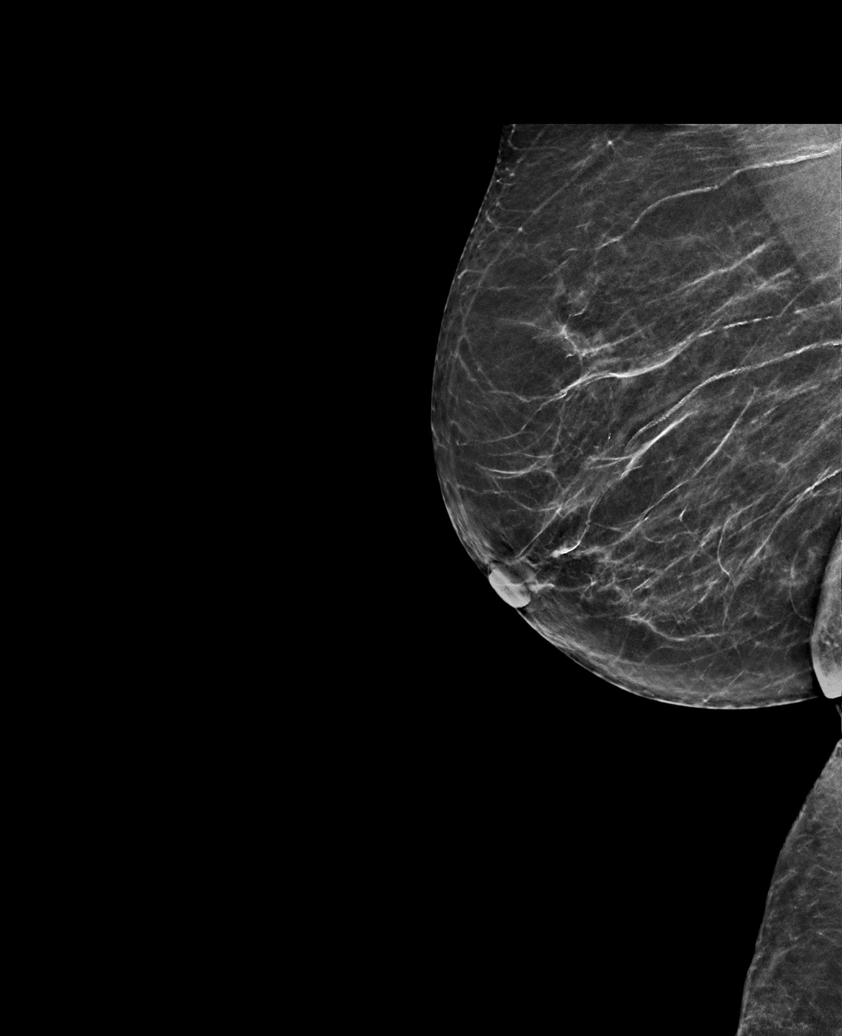

[L MLO synth-2D (1 of 2)]
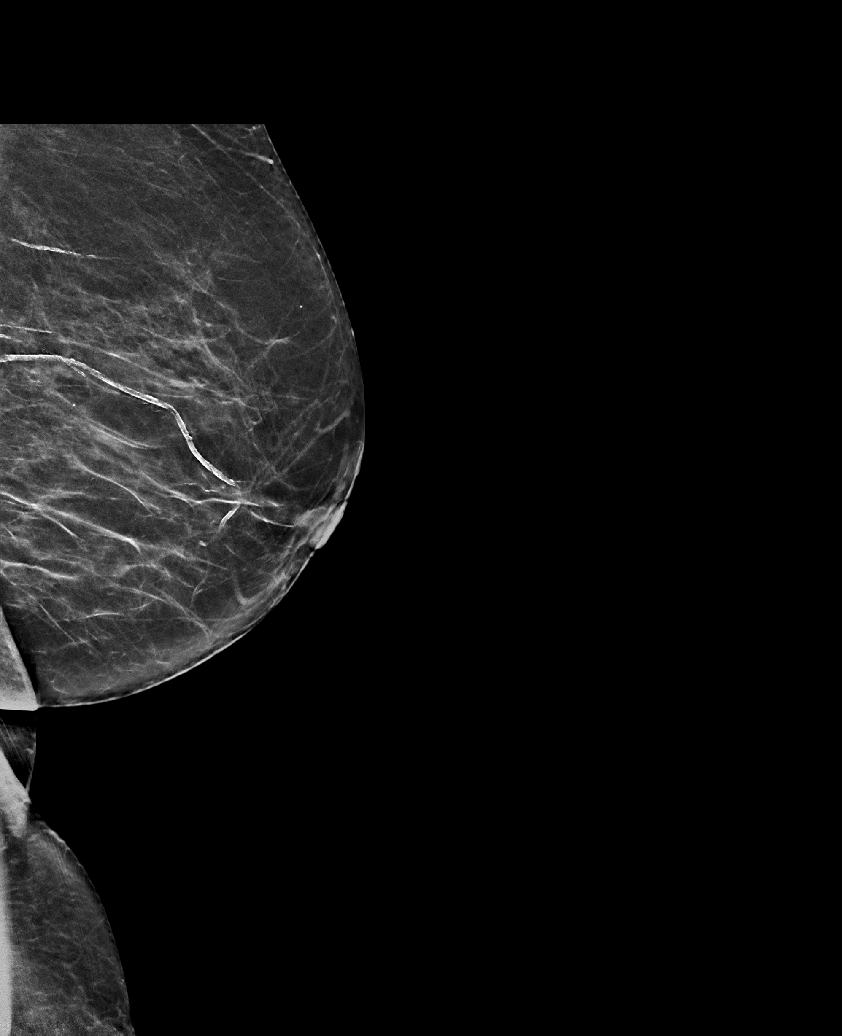

[L MLO synth-2D (2 of 2)]
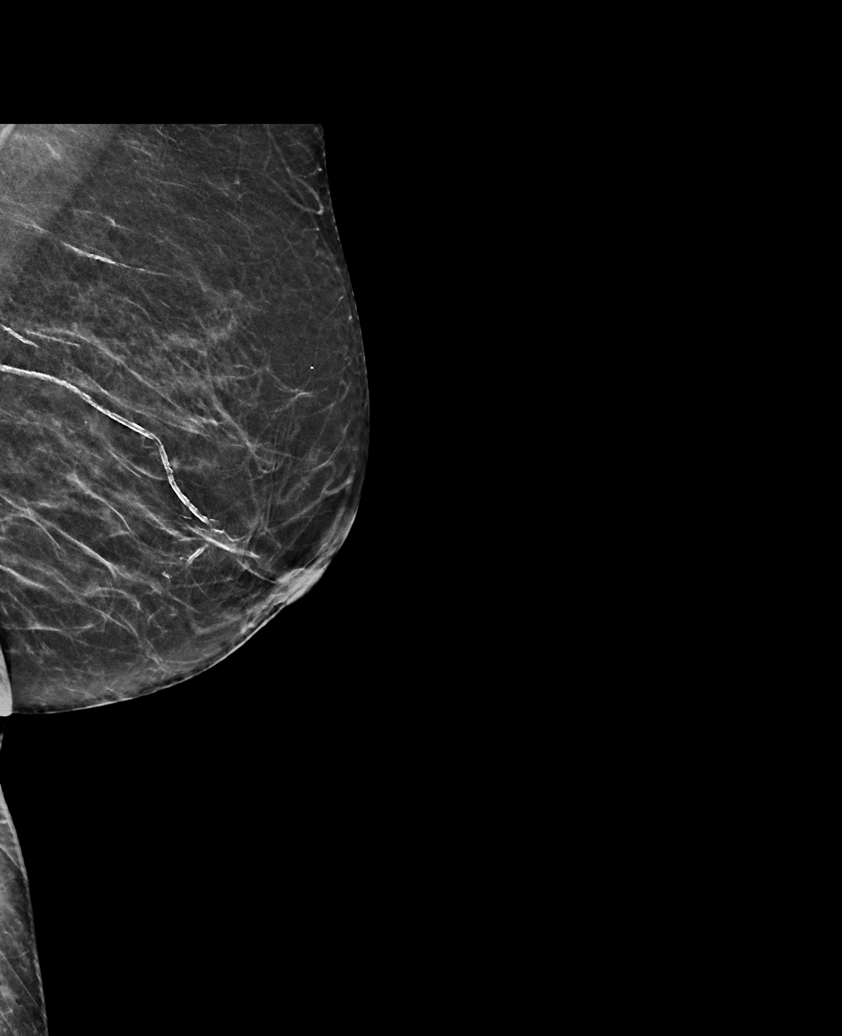

[R CC synth-2D]
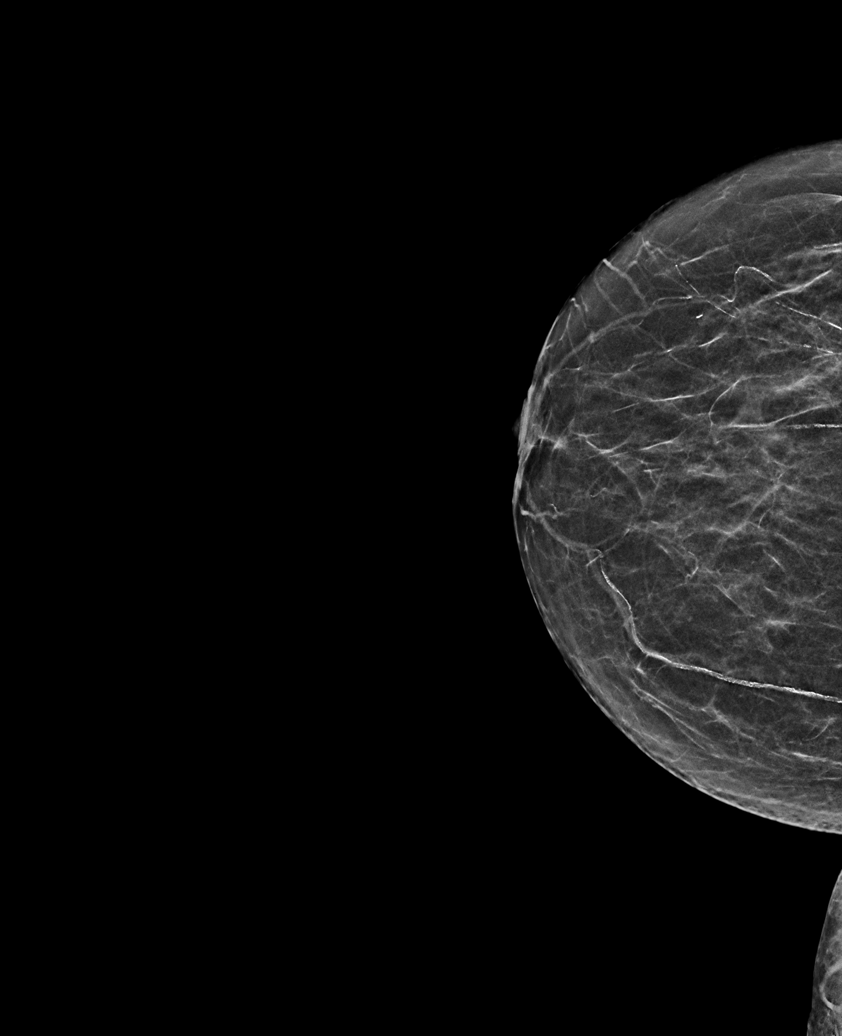

[L CC synth-2D]
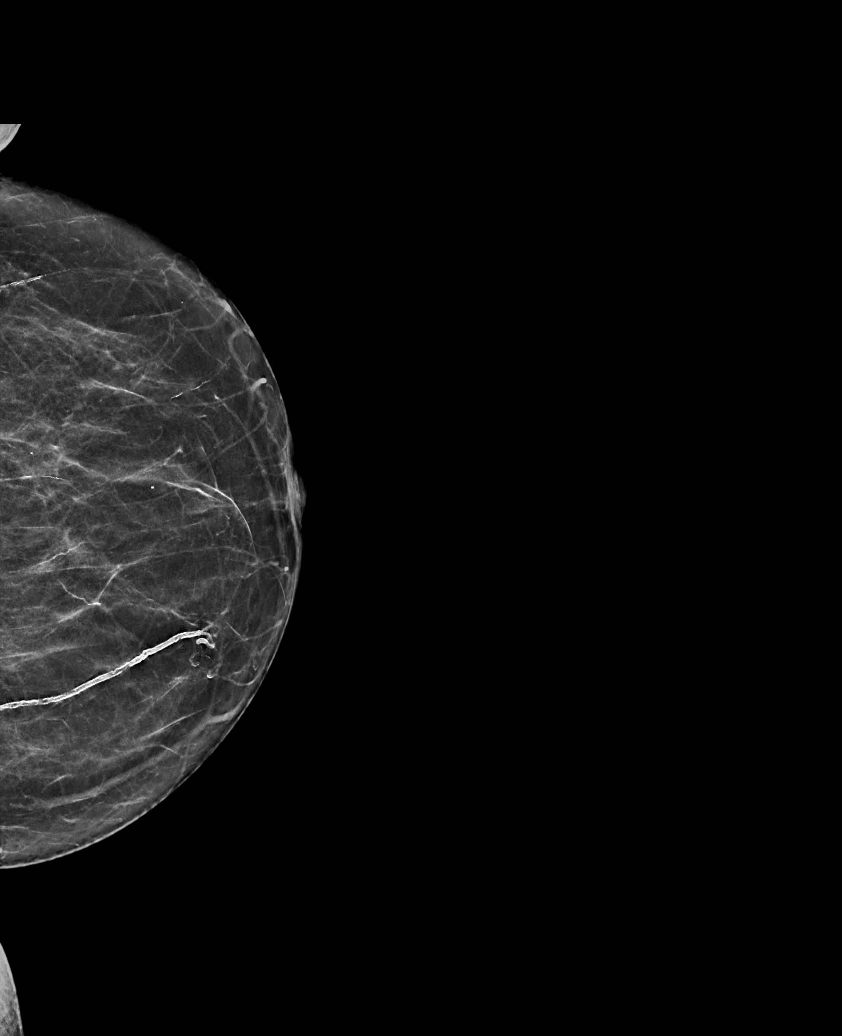

[6 of 36 positions shown; findings below may reference images not displayed]

ACR Breast Density Category b: There are scattered areas of
fibroglandular density.
FINDINGS: There are no findings suspicious for malignancy. Images were
processed with CAD.
IMPRESSION: No mammographic evidence of malignancy. A result letter of this
screening mammogram will be mailed directly to the patient.

RECOMMENDATION:
Screening mammogram in one year. (Code:[TQ])

BI-RADS CATEGORY  1: Negative.

## 2018-09-16 DIAGNOSIS — E119 Type 2 diabetes mellitus without complications: Secondary | ICD-10-CM | POA: Diagnosis not present

## 2018-09-16 DIAGNOSIS — I10 Essential (primary) hypertension: Secondary | ICD-10-CM | POA: Diagnosis not present

## 2018-09-16 DIAGNOSIS — Z1389 Encounter for screening for other disorder: Secondary | ICD-10-CM | POA: Diagnosis not present

## 2018-09-16 DIAGNOSIS — Z6826 Body mass index (BMI) 26.0-26.9, adult: Secondary | ICD-10-CM | POA: Diagnosis not present

## 2018-09-16 DIAGNOSIS — E663 Overweight: Secondary | ICD-10-CM | POA: Diagnosis not present

## 2018-09-17 DIAGNOSIS — E119 Type 2 diabetes mellitus without complications: Secondary | ICD-10-CM | POA: Diagnosis not present

## 2018-09-18 DIAGNOSIS — Z6826 Body mass index (BMI) 26.0-26.9, adult: Secondary | ICD-10-CM | POA: Diagnosis not present

## 2018-09-18 DIAGNOSIS — E119 Type 2 diabetes mellitus without complications: Secondary | ICD-10-CM | POA: Diagnosis not present

## 2018-09-18 DIAGNOSIS — Z1389 Encounter for screening for other disorder: Secondary | ICD-10-CM | POA: Diagnosis not present

## 2018-09-18 DIAGNOSIS — E663 Overweight: Secondary | ICD-10-CM | POA: Diagnosis not present

## 2018-09-18 DIAGNOSIS — I10 Essential (primary) hypertension: Secondary | ICD-10-CM | POA: Diagnosis not present

## 2018-09-30 DIAGNOSIS — E1165 Type 2 diabetes mellitus with hyperglycemia: Secondary | ICD-10-CM | POA: Diagnosis not present

## 2018-09-30 DIAGNOSIS — R739 Hyperglycemia, unspecified: Secondary | ICD-10-CM | POA: Diagnosis not present

## 2018-09-30 DIAGNOSIS — S199XXA Unspecified injury of neck, initial encounter: Secondary | ICD-10-CM | POA: Diagnosis not present

## 2018-09-30 DIAGNOSIS — S0990XA Unspecified injury of head, initial encounter: Secondary | ICD-10-CM | POA: Diagnosis not present

## 2018-12-18 DIAGNOSIS — Z23 Encounter for immunization: Secondary | ICD-10-CM | POA: Diagnosis not present

## 2018-12-18 DIAGNOSIS — I10 Essential (primary) hypertension: Secondary | ICD-10-CM | POA: Diagnosis not present

## 2018-12-18 DIAGNOSIS — E7849 Other hyperlipidemia: Secondary | ICD-10-CM | POA: Diagnosis not present

## 2018-12-18 DIAGNOSIS — E663 Overweight: Secondary | ICD-10-CM | POA: Diagnosis not present

## 2018-12-18 DIAGNOSIS — Z6825 Body mass index (BMI) 25.0-25.9, adult: Secondary | ICD-10-CM | POA: Diagnosis not present

## 2018-12-18 DIAGNOSIS — E119 Type 2 diabetes mellitus without complications: Secondary | ICD-10-CM | POA: Diagnosis not present

## 2019-03-22 DIAGNOSIS — E7849 Other hyperlipidemia: Secondary | ICD-10-CM | POA: Diagnosis not present

## 2019-03-22 DIAGNOSIS — E1165 Type 2 diabetes mellitus with hyperglycemia: Secondary | ICD-10-CM | POA: Diagnosis not present

## 2019-03-22 DIAGNOSIS — I10 Essential (primary) hypertension: Secondary | ICD-10-CM | POA: Diagnosis not present

## 2019-03-25 DIAGNOSIS — E7849 Other hyperlipidemia: Secondary | ICD-10-CM | POA: Diagnosis not present

## 2019-03-25 DIAGNOSIS — Z6825 Body mass index (BMI) 25.0-25.9, adult: Secondary | ICD-10-CM | POA: Diagnosis not present

## 2019-03-25 DIAGNOSIS — E119 Type 2 diabetes mellitus without complications: Secondary | ICD-10-CM | POA: Diagnosis not present

## 2019-03-25 DIAGNOSIS — Z1389 Encounter for screening for other disorder: Secondary | ICD-10-CM | POA: Diagnosis not present

## 2019-03-25 DIAGNOSIS — Z0001 Encounter for general adult medical examination with abnormal findings: Secondary | ICD-10-CM | POA: Diagnosis not present

## 2019-03-25 DIAGNOSIS — E663 Overweight: Secondary | ICD-10-CM | POA: Diagnosis not present

## 2019-03-25 DIAGNOSIS — I10 Essential (primary) hypertension: Secondary | ICD-10-CM | POA: Diagnosis not present

## 2019-05-20 DIAGNOSIS — E7849 Other hyperlipidemia: Secondary | ICD-10-CM | POA: Diagnosis not present

## 2019-05-20 DIAGNOSIS — I10 Essential (primary) hypertension: Secondary | ICD-10-CM | POA: Diagnosis not present

## 2019-05-20 DIAGNOSIS — E1165 Type 2 diabetes mellitus with hyperglycemia: Secondary | ICD-10-CM | POA: Diagnosis not present

## 2019-05-28 DIAGNOSIS — E083392 Diabetes mellitus due to underlying condition with moderate nonproliferative diabetic retinopathy without macular edema, left eye: Secondary | ICD-10-CM | POA: Diagnosis not present

## 2019-05-28 DIAGNOSIS — H524 Presbyopia: Secondary | ICD-10-CM | POA: Diagnosis not present

## 2019-07-07 DIAGNOSIS — E7849 Other hyperlipidemia: Secondary | ICD-10-CM | POA: Diagnosis not present

## 2019-07-07 DIAGNOSIS — Z6826 Body mass index (BMI) 26.0-26.9, adult: Secondary | ICD-10-CM | POA: Diagnosis not present

## 2019-07-07 DIAGNOSIS — E663 Overweight: Secondary | ICD-10-CM | POA: Diagnosis not present

## 2019-07-07 DIAGNOSIS — I10 Essential (primary) hypertension: Secondary | ICD-10-CM | POA: Diagnosis not present

## 2019-07-07 DIAGNOSIS — E119 Type 2 diabetes mellitus without complications: Secondary | ICD-10-CM | POA: Diagnosis not present

## 2019-07-14 ENCOUNTER — Other Ambulatory Visit (HOSPITAL_COMMUNITY): Payer: Self-pay | Admitting: Family Medicine

## 2019-07-14 DIAGNOSIS — Z1231 Encounter for screening mammogram for malignant neoplasm of breast: Secondary | ICD-10-CM

## 2019-08-03 ENCOUNTER — Ambulatory Visit (HOSPITAL_COMMUNITY)
Admission: RE | Admit: 2019-08-03 | Discharge: 2019-08-03 | Disposition: A | Discharge status: Home / Self Care | Payer: PPO | Source: Ambulatory Visit | Attending: Family Medicine | Admitting: Family Medicine

## 2019-08-03 ENCOUNTER — Other Ambulatory Visit: Payer: Self-pay

## 2019-08-03 DIAGNOSIS — Z1231 Encounter for screening mammogram for malignant neoplasm of breast: Secondary | ICD-10-CM | POA: Diagnosis not present

## 2019-08-03 IMAGING — MG DIGITAL SCREENING BILAT W/ TOMO W/ CAD
8 of 14 series · 9 of 40 positions shown · non-contrast
Comparison: Previous exam(s).

CLINICAL DATA: Screening.

EXAM:
DIGITAL SCREENING BILATERAL MAMMOGRAM WITH TOMO AND CAD

[R MLO synth-2D (1 of 3)]
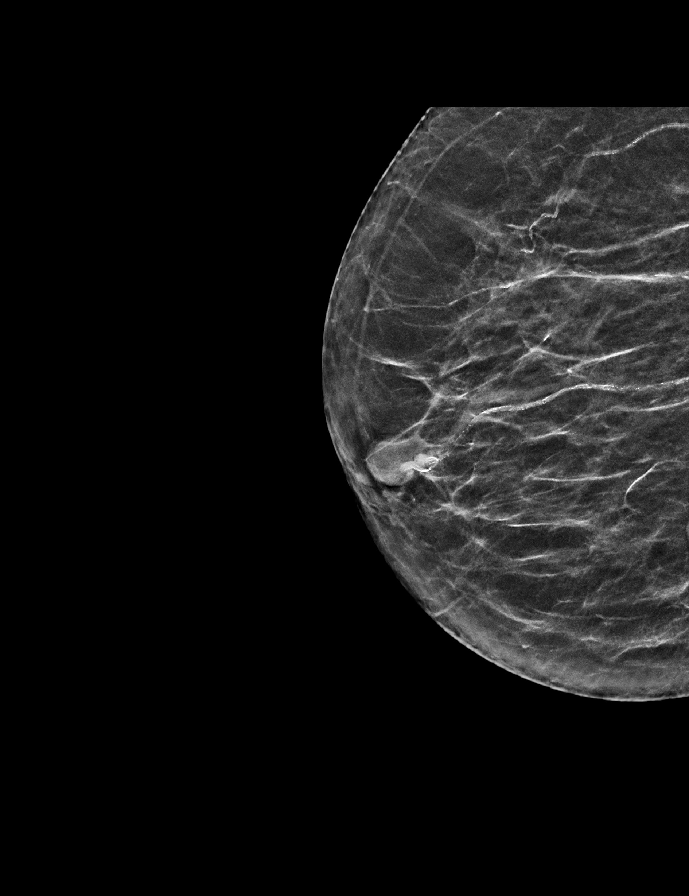

[L MLO synth-2D (1 of 2)]
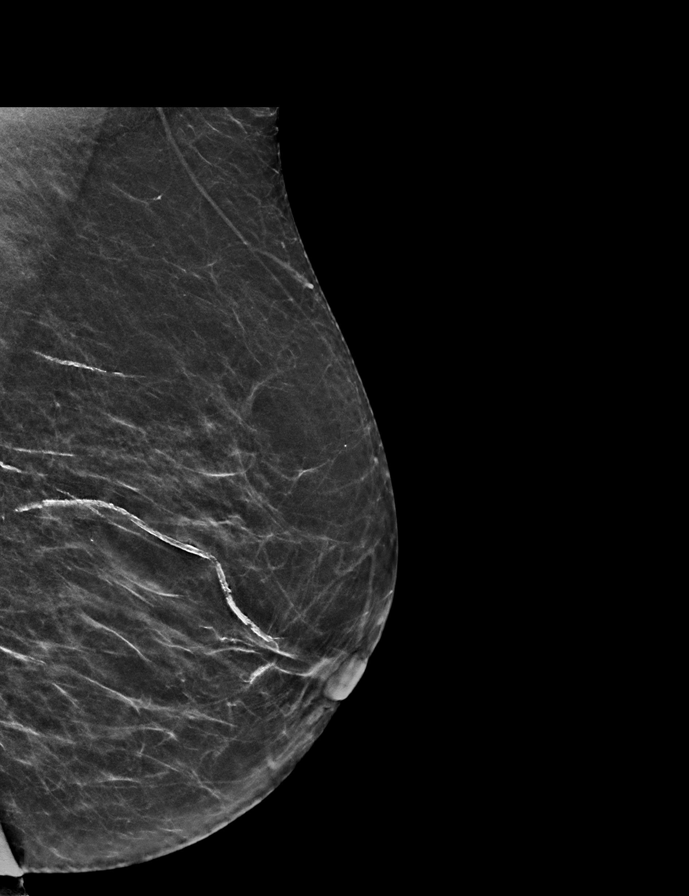

[R CC synth-2D]
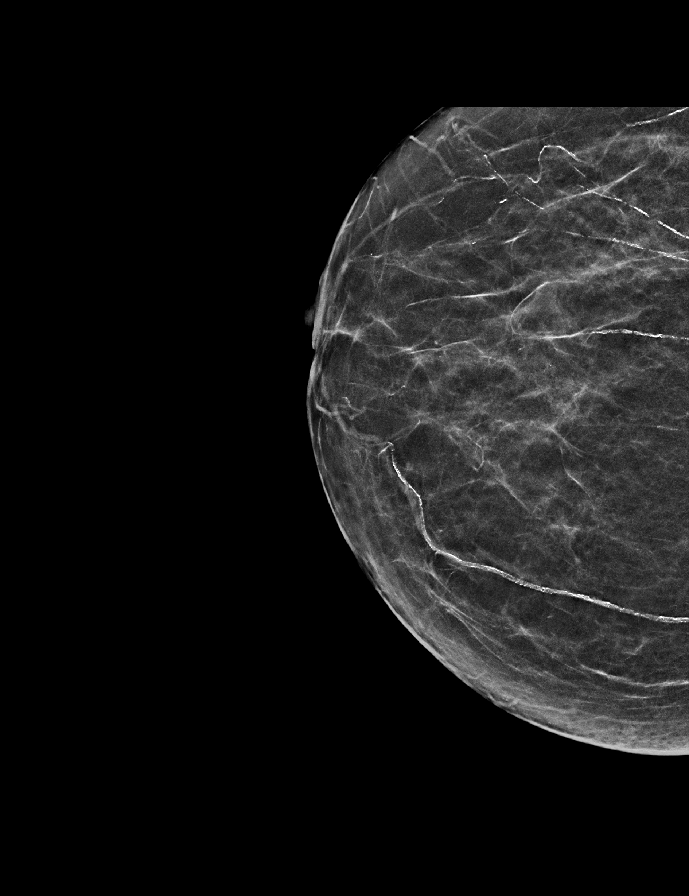

[L CC synth-2D]
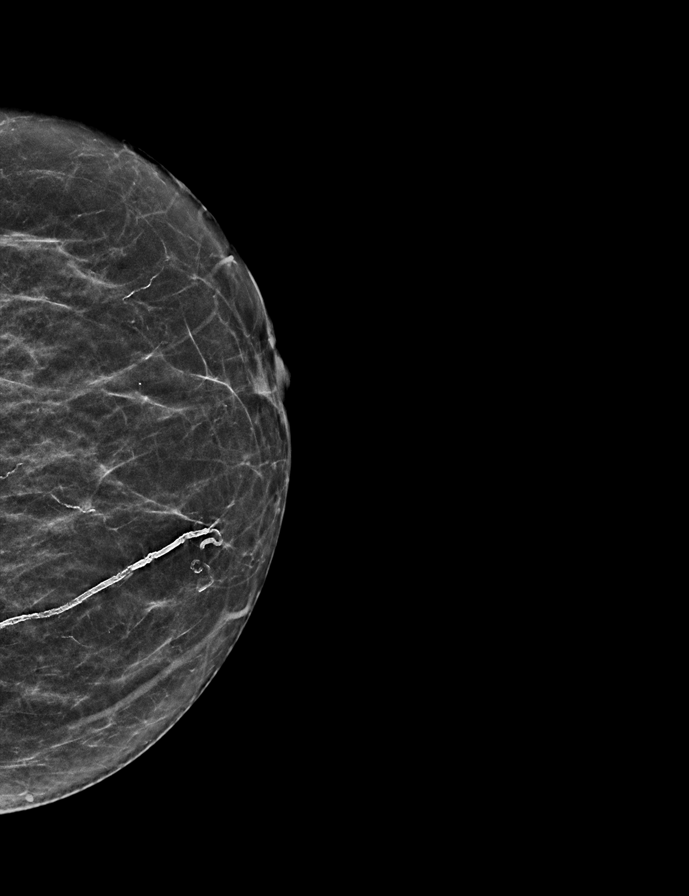

[R MLO synth-2D (2 of 3)]
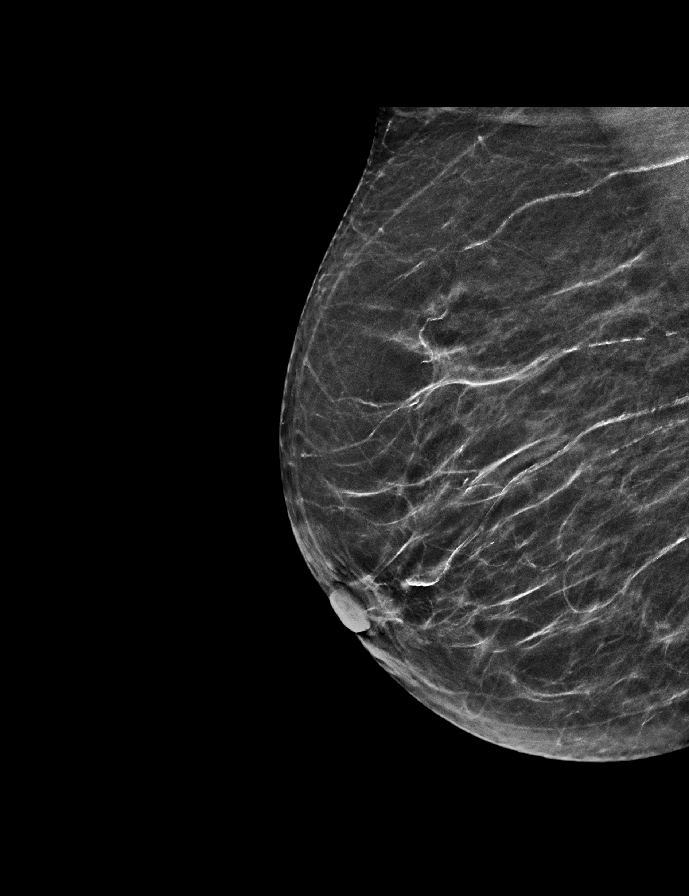

[L MLO synth-2D (2 of 2)]
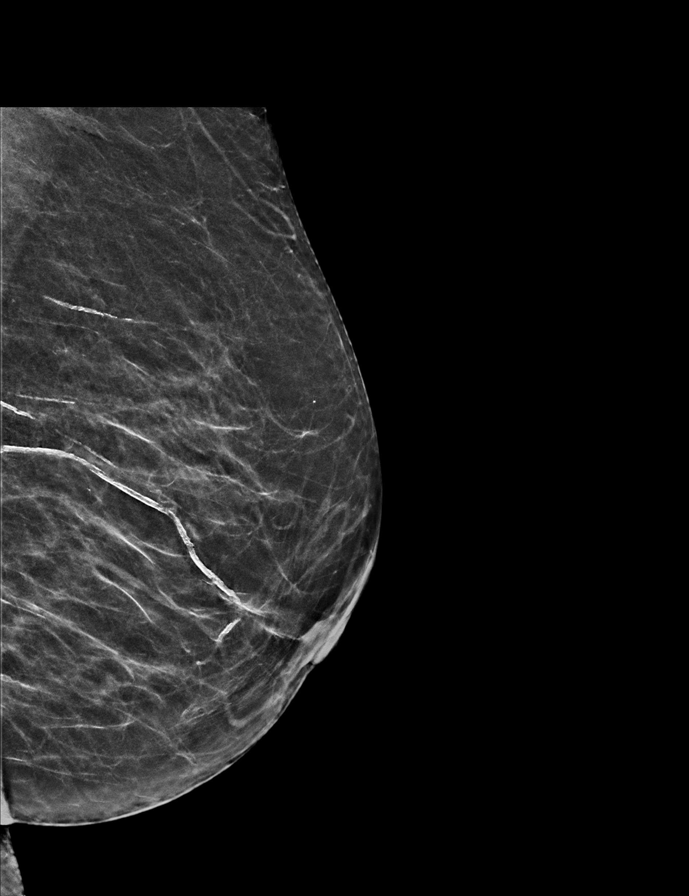

[R MLO synth-2D (3 of 3)]
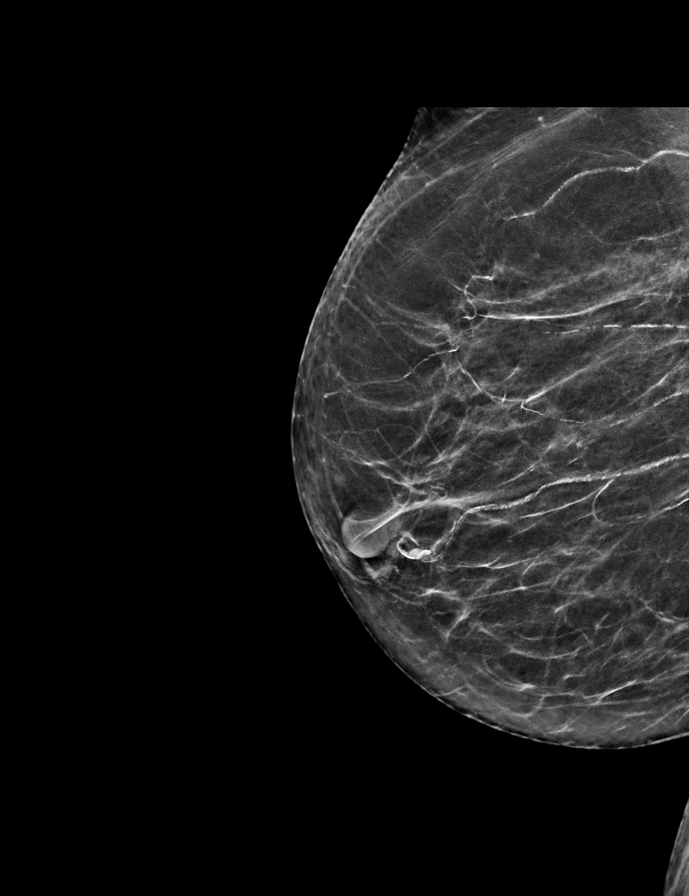

[R MLO tomo · 2 of 55 frames shown]
[frame 18/55]
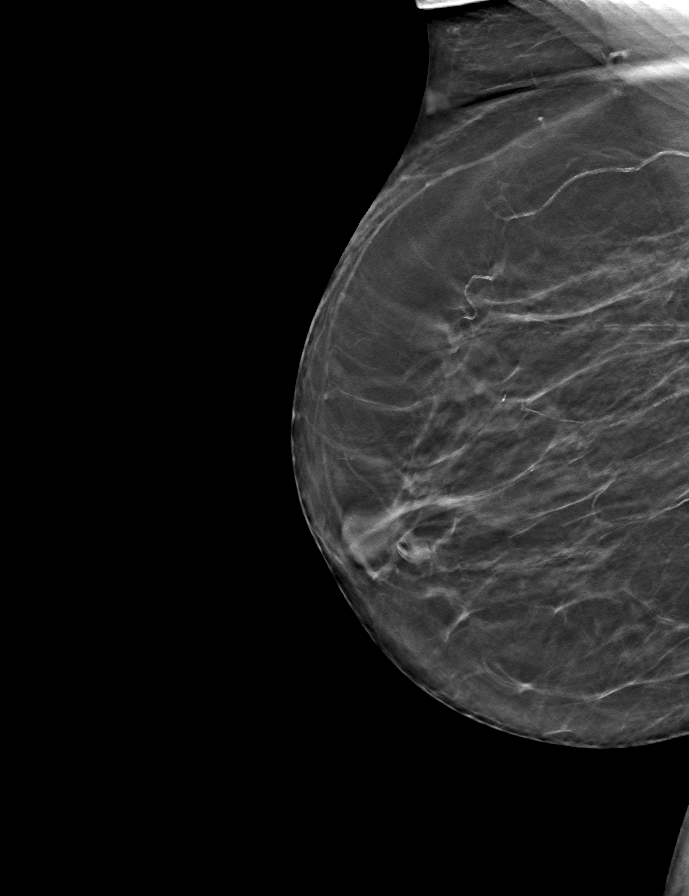
[frame 28/55]
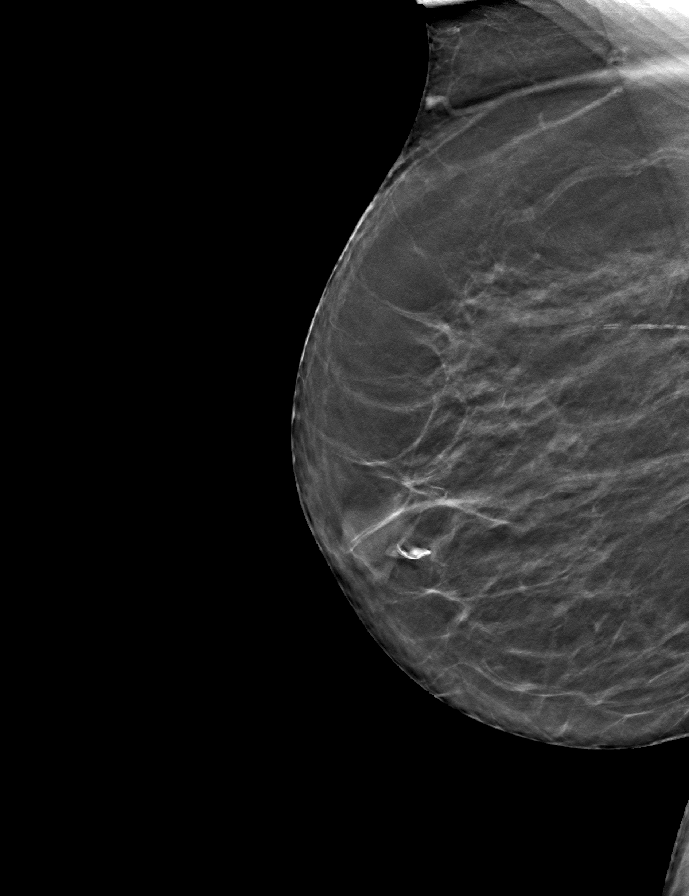

[9 of 40 positions shown; findings below may reference images not displayed]

ACR Breast Density Category b: There are scattered areas of
fibroglandular density.
FINDINGS: There are no findings suspicious for malignancy. Images were
processed with CAD.
IMPRESSION: No mammographic evidence of malignancy. A result letter of this
screening mammogram will be mailed directly to the patient.

RECOMMENDATION:
Screening mammogram in one year. (Code:[TQ])

BI-RADS CATEGORY  1: Negative.

## 2019-08-19 DIAGNOSIS — I10 Essential (primary) hypertension: Secondary | ICD-10-CM | POA: Diagnosis not present

## 2019-08-19 DIAGNOSIS — E7849 Other hyperlipidemia: Secondary | ICD-10-CM | POA: Diagnosis not present

## 2019-08-19 DIAGNOSIS — E1165 Type 2 diabetes mellitus with hyperglycemia: Secondary | ICD-10-CM | POA: Diagnosis not present

## 2019-09-18 DIAGNOSIS — E7849 Other hyperlipidemia: Secondary | ICD-10-CM | POA: Diagnosis not present

## 2019-09-18 DIAGNOSIS — E1165 Type 2 diabetes mellitus with hyperglycemia: Secondary | ICD-10-CM | POA: Diagnosis not present

## 2019-09-18 DIAGNOSIS — I10 Essential (primary) hypertension: Secondary | ICD-10-CM | POA: Diagnosis not present

## 2019-10-20 DIAGNOSIS — I10 Essential (primary) hypertension: Secondary | ICD-10-CM | POA: Diagnosis not present

## 2019-10-20 DIAGNOSIS — E7849 Other hyperlipidemia: Secondary | ICD-10-CM | POA: Diagnosis not present

## 2019-10-20 DIAGNOSIS — E1165 Type 2 diabetes mellitus with hyperglycemia: Secondary | ICD-10-CM | POA: Diagnosis not present

## 2019-12-19 DIAGNOSIS — E1165 Type 2 diabetes mellitus with hyperglycemia: Secondary | ICD-10-CM | POA: Diagnosis not present

## 2019-12-19 DIAGNOSIS — E7849 Other hyperlipidemia: Secondary | ICD-10-CM | POA: Diagnosis not present

## 2019-12-19 DIAGNOSIS — I10 Essential (primary) hypertension: Secondary | ICD-10-CM | POA: Diagnosis not present

## 2019-12-29 DIAGNOSIS — E119 Type 2 diabetes mellitus without complications: Secondary | ICD-10-CM | POA: Diagnosis not present

## 2019-12-29 DIAGNOSIS — E113291 Type 2 diabetes mellitus with mild nonproliferative diabetic retinopathy without macular edema, right eye: Secondary | ICD-10-CM | POA: Diagnosis not present

## 2020-02-18 ENCOUNTER — Inpatient Hospital Stay (HOSPITAL_COMMUNITY)
Admission: EM | Admit: 2020-02-18 | Discharge: 2020-02-22 | DRG: 522 | Disposition: A | Discharge status: Skilled Nursing Facility | Payer: PPO | Attending: Internal Medicine | Admitting: Internal Medicine

## 2020-02-18 ENCOUNTER — Inpatient Hospital Stay (HOSPITAL_COMMUNITY): Payer: PPO

## 2020-02-18 ENCOUNTER — Inpatient Hospital Stay (HOSPITAL_COMMUNITY): Payer: PPO | Admitting: Anesthesiology

## 2020-02-18 ENCOUNTER — Emergency Department (HOSPITAL_COMMUNITY): Payer: PPO

## 2020-02-18 ENCOUNTER — Encounter (HOSPITAL_COMMUNITY): Payer: Self-pay

## 2020-02-18 ENCOUNTER — Encounter (HOSPITAL_COMMUNITY)
Admission: EM | Disposition: A | Discharge status: Skilled Nursing Facility | Payer: Self-pay | Source: Home / Self Care | Attending: Internal Medicine

## 2020-02-18 ENCOUNTER — Other Ambulatory Visit: Payer: Self-pay

## 2020-02-18 DIAGNOSIS — Z043 Encounter for examination and observation following other accident: Secondary | ICD-10-CM | POA: Diagnosis not present

## 2020-02-18 DIAGNOSIS — S72002D Fracture of unspecified part of neck of left femur, subsequent encounter for closed fracture with routine healing: Secondary | ICD-10-CM | POA: Diagnosis not present

## 2020-02-18 DIAGNOSIS — F29 Unspecified psychosis not due to a substance or known physiological condition: Secondary | ICD-10-CM | POA: Diagnosis not present

## 2020-02-18 DIAGNOSIS — S72002G Fracture of unspecified part of neck of left femur, subsequent encounter for closed fracture with delayed healing: Secondary | ICD-10-CM | POA: Diagnosis not present

## 2020-02-18 DIAGNOSIS — Z20822 Contact with and (suspected) exposure to covid-19: Secondary | ICD-10-CM | POA: Diagnosis not present

## 2020-02-18 DIAGNOSIS — Z471 Aftercare following joint replacement surgery: Secondary | ICD-10-CM | POA: Diagnosis not present

## 2020-02-18 DIAGNOSIS — I959 Hypotension, unspecified: Secondary | ICD-10-CM | POA: Diagnosis not present

## 2020-02-18 DIAGNOSIS — E119 Type 2 diabetes mellitus without complications: Secondary | ICD-10-CM | POA: Diagnosis present

## 2020-02-18 DIAGNOSIS — W010XXA Fall on same level from slipping, tripping and stumbling without subsequent striking against object, initial encounter: Secondary | ICD-10-CM | POA: Diagnosis present

## 2020-02-18 DIAGNOSIS — E782 Mixed hyperlipidemia: Secondary | ICD-10-CM

## 2020-02-18 DIAGNOSIS — E785 Hyperlipidemia, unspecified: Secondary | ICD-10-CM | POA: Diagnosis not present

## 2020-02-18 DIAGNOSIS — Z9889 Other specified postprocedural states: Secondary | ICD-10-CM | POA: Diagnosis not present

## 2020-02-18 DIAGNOSIS — R5082 Postprocedural fever: Secondary | ICD-10-CM | POA: Diagnosis not present

## 2020-02-18 DIAGNOSIS — Z7984 Long term (current) use of oral hypoglycemic drugs: Secondary | ICD-10-CM

## 2020-02-18 DIAGNOSIS — Z96642 Presence of left artificial hip joint: Secondary | ICD-10-CM | POA: Diagnosis not present

## 2020-02-18 DIAGNOSIS — D649 Anemia, unspecified: Secondary | ICD-10-CM | POA: Diagnosis not present

## 2020-02-18 DIAGNOSIS — M6281 Muscle weakness (generalized): Secondary | ICD-10-CM | POA: Diagnosis not present

## 2020-02-18 DIAGNOSIS — E876 Hypokalemia: Secondary | ICD-10-CM | POA: Diagnosis present

## 2020-02-18 DIAGNOSIS — Z23 Encounter for immunization: Secondary | ICD-10-CM | POA: Diagnosis not present

## 2020-02-18 DIAGNOSIS — I10 Essential (primary) hypertension: Secondary | ICD-10-CM | POA: Diagnosis not present

## 2020-02-18 DIAGNOSIS — S72002A Fracture of unspecified part of neck of left femur, initial encounter for closed fracture: Secondary | ICD-10-CM | POA: Diagnosis not present

## 2020-02-18 DIAGNOSIS — M25552 Pain in left hip: Secondary | ICD-10-CM | POA: Diagnosis not present

## 2020-02-18 DIAGNOSIS — E1165 Type 2 diabetes mellitus with hyperglycemia: Secondary | ICD-10-CM | POA: Diagnosis not present

## 2020-02-18 DIAGNOSIS — W19XXXA Unspecified fall, initial encounter: Secondary | ICD-10-CM | POA: Diagnosis not present

## 2020-02-18 DIAGNOSIS — Z79899 Other long term (current) drug therapy: Secondary | ICD-10-CM

## 2020-02-18 DIAGNOSIS — Y92017 Garden or yard in single-family (private) house as the place of occurrence of the external cause: Secondary | ICD-10-CM

## 2020-02-18 DIAGNOSIS — E118 Type 2 diabetes mellitus with unspecified complications: Secondary | ICD-10-CM | POA: Diagnosis not present

## 2020-02-18 DIAGNOSIS — Z4732 Aftercare following explantation of hip joint prosthesis: Secondary | ICD-10-CM | POA: Diagnosis not present

## 2020-02-18 DIAGNOSIS — R262 Difficulty in walking, not elsewhere classified: Secondary | ICD-10-CM | POA: Diagnosis not present

## 2020-02-18 DIAGNOSIS — Z7401 Bed confinement status: Secondary | ICD-10-CM | POA: Diagnosis not present

## 2020-02-18 HISTORY — PX: HIP ARTHROPLASTY: SHX981

## 2020-02-18 LAB — COMPREHENSIVE METABOLIC PANEL
ALT: 18 U/L (ref 0–44)
AST: 23 U/L (ref 15–41)
Albumin: 3.9 g/dL (ref 3.5–5.0)
Alkaline Phosphatase: 72 U/L (ref 38–126)
Anion gap: 12 (ref 5–15)
BUN: 25 mg/dL — ABNORMAL HIGH (ref 8–23)
CO2: 22 mmol/L (ref 22–32)
Calcium: 9.3 mg/dL (ref 8.9–10.3)
Chloride: 102 mmol/L (ref 98–111)
Creatinine, Ser: 0.74 mg/dL (ref 0.44–1.00)
GFR, Estimated: >60 mL/min (ref >60)
Glucose, Bld: 372 mg/dL — ABNORMAL HIGH (ref 70–99)
Potassium: 4.2 mmol/L (ref 3.5–5.1)
Sodium: 136 mmol/L (ref 135–145)
Total Bilirubin: 0.9 mg/dL (ref 0.3–1.2)
Total Protein: 7.6 g/dL (ref 6.5–8.1)

## 2020-02-18 LAB — CBC WITH DIFFERENTIAL/PLATELET
Abs Immature Granulocytes: 0.07 10*3/uL (ref 0.00–0.07)
Basophils Absolute: 0 10*3/uL (ref 0.0–0.1)
Basophils Relative: 0 %
Eosinophils Absolute: 0 10*3/uL (ref 0.0–0.5)
Eosinophils Relative: 0 %
HCT: 34.4 % — ABNORMAL LOW (ref 36.0–46.0)
Hemoglobin: 11.8 g/dL — ABNORMAL LOW (ref 12.0–15.0)
Immature Granulocytes: 1 %
Lymphocytes Relative: 7 %
Lymphs Abs: 0.8 10*3/uL (ref 0.7–4.0)
MCH: 33.1 pg (ref 26.0–34.0)
MCHC: 34.3 g/dL (ref 30.0–36.0)
MCV: 96.4 fL (ref 80.0–100.0)
Monocytes Absolute: 1.1 10*3/uL — ABNORMAL HIGH (ref 0.1–1.0)
Monocytes Relative: 10 %
Neutro Abs: 9.1 10*3/uL — ABNORMAL HIGH (ref 1.7–7.7)
Neutrophils Relative %: 82 %
Platelets: 246 10*3/uL (ref 150–400)
RBC: 3.57 MIL/uL — ABNORMAL LOW (ref 3.87–5.11)
RDW: 12.5 % (ref 11.5–15.5)
WBC: 11 10*3/uL — ABNORMAL HIGH (ref 4.0–10.5)
nRBC: 0 % (ref 0.0–0.2)

## 2020-02-18 LAB — GLUCOSE, CAPILLARY
Glucose-Capillary: 285 mg/dL — ABNORMAL HIGH (ref 70–99)
Glucose-Capillary: 275 mg/dL — ABNORMAL HIGH (ref 70–99)
Glucose-Capillary: 235 mg/dL — ABNORMAL HIGH (ref 70–99)

## 2020-02-18 LAB — TYPE AND SCREEN
ABO/RH(D): A POS
Antibody Screen: NEGATIVE

## 2020-02-18 LAB — RESP PANEL BY RT-PCR (FLU A&B, COVID) ARPGX2
Influenza A by PCR: NEGATIVE
Influenza B by PCR: NEGATIVE
SARS Coronavirus 2 by RT PCR: NEGATIVE

## 2020-02-18 LAB — CK: Total CK: 567 U/L — ABNORMAL HIGH (ref 38–234)

## 2020-02-18 LAB — HEMOGLOBIN A1C
Hgb A1c MFr Bld: 7.6 % — ABNORMAL HIGH (ref 4.8–5.6)
Mean Plasma Glucose: 171.42 mg/dL

## 2020-02-18 IMAGING — DX DG CHEST 1V PORT
1 series · 1 of 1 positions shown · non-contrast
Comparison: None.

CLINICAL DATA: 85-year-old female status post fall last night,
found down by neighbor this morning. Left hip fracture.

EXAM:
PORTABLE CHEST 1 VIEW

[chest ap]
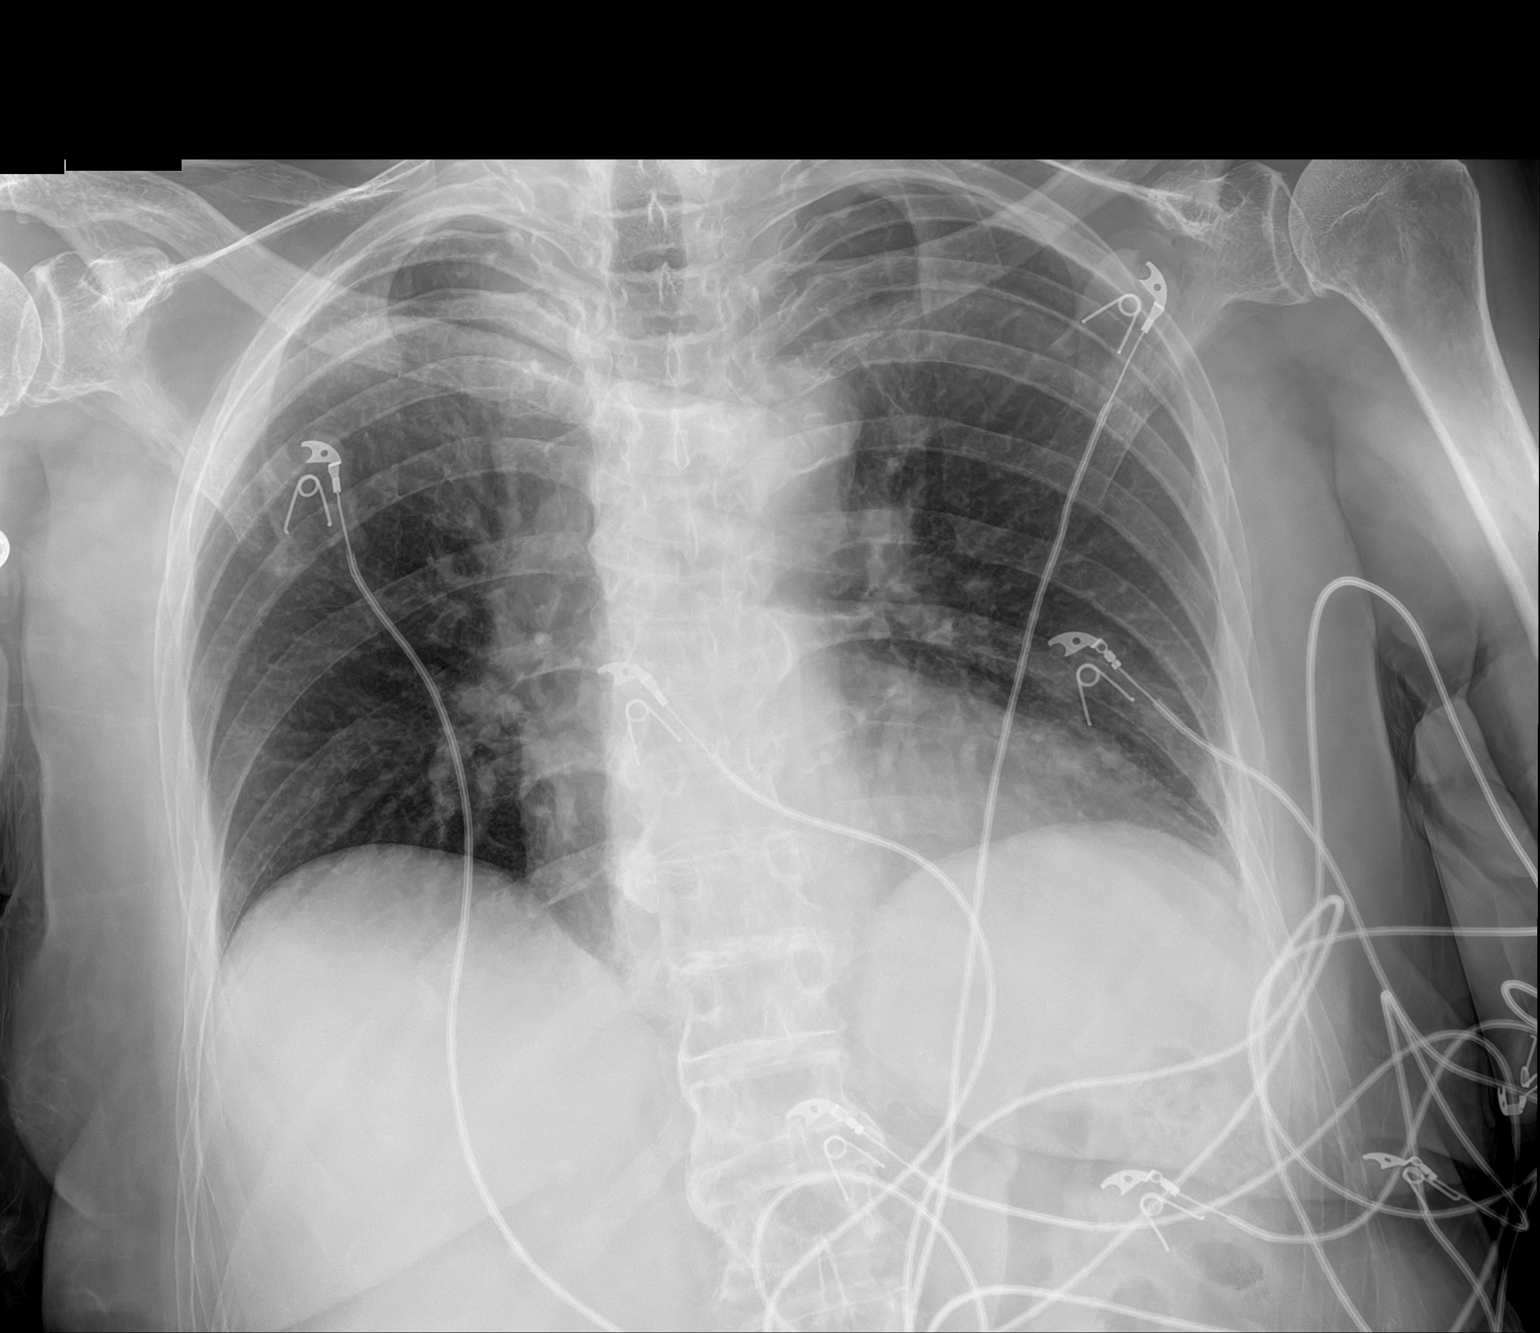

[1 of 1 positions shown; findings below may reference images not displayed]

FINDINGS: Portable AP semi upright view at [8G] hours. Somewhat low lung
volumes. Mediastinal contours within normal limits. Visualized
tracheal air column is within normal limits. Allowing for portable
technique the lungs are clear. Negative visible bowel gas pattern.
No acute osseous abnormality identified.
IMPRESSION: No acute cardiopulmonary abnormality.

## 2020-02-18 IMAGING — DX DG HIP (WITH OR WITHOUT PELVIS) 2-3V*L*
3 series · 3 of 3 positions shown · non-contrast
Comparison: [DATE]

CLINICAL DATA: Left hip fracture status post arthroplasty

EXAM:
DG HIP (WITH OR WITHOUT PELVIS) 2-3V LEFT

[pelvis ap]
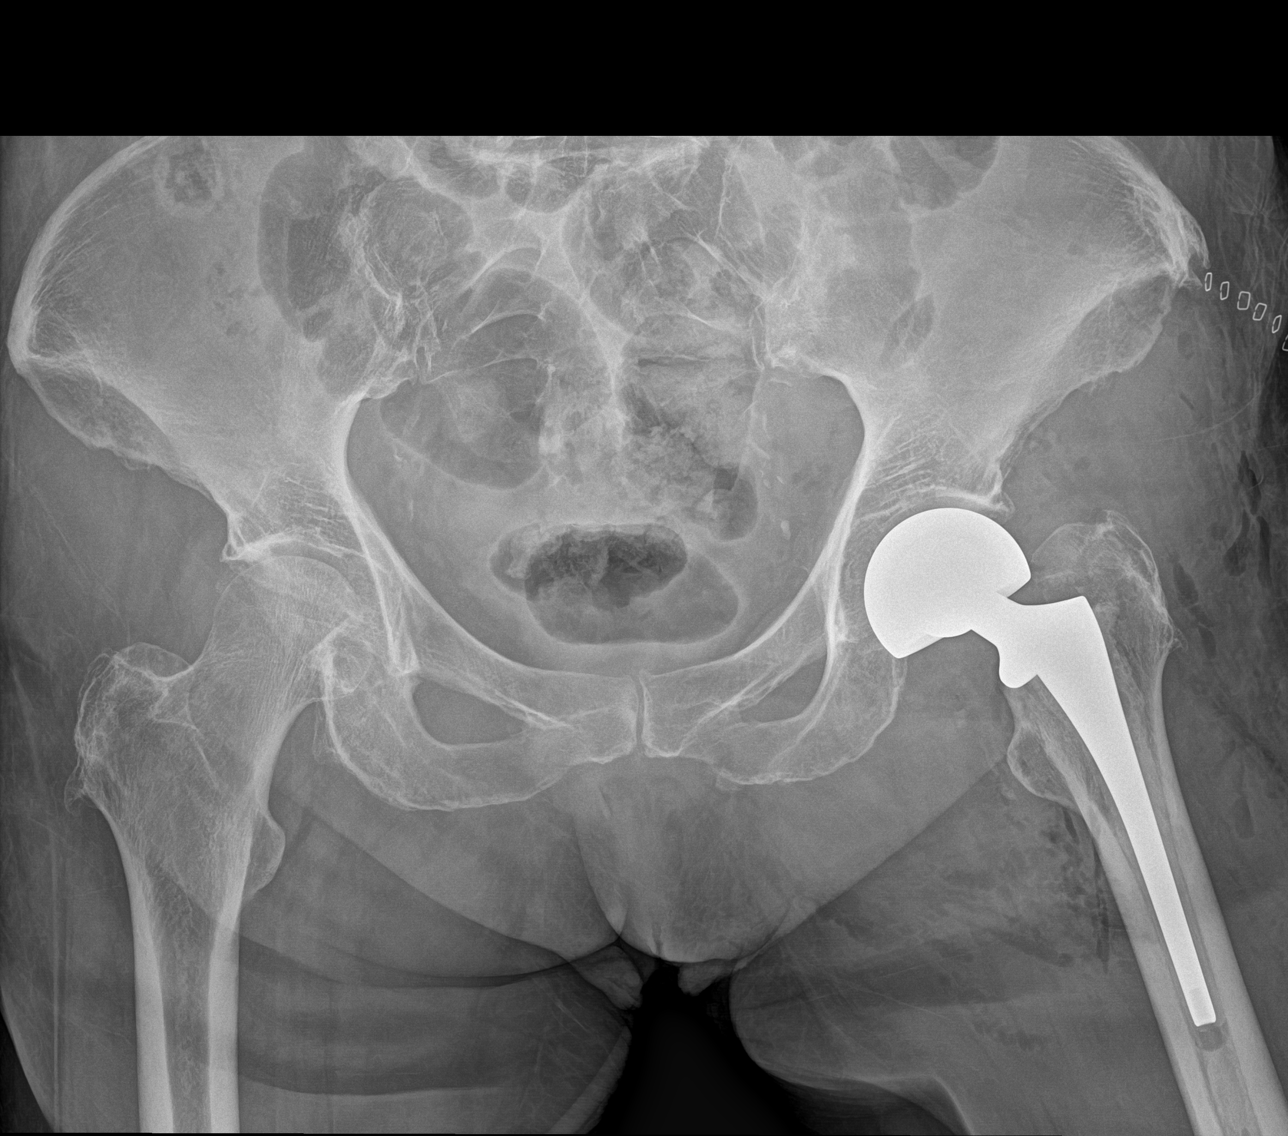

[hip ap]
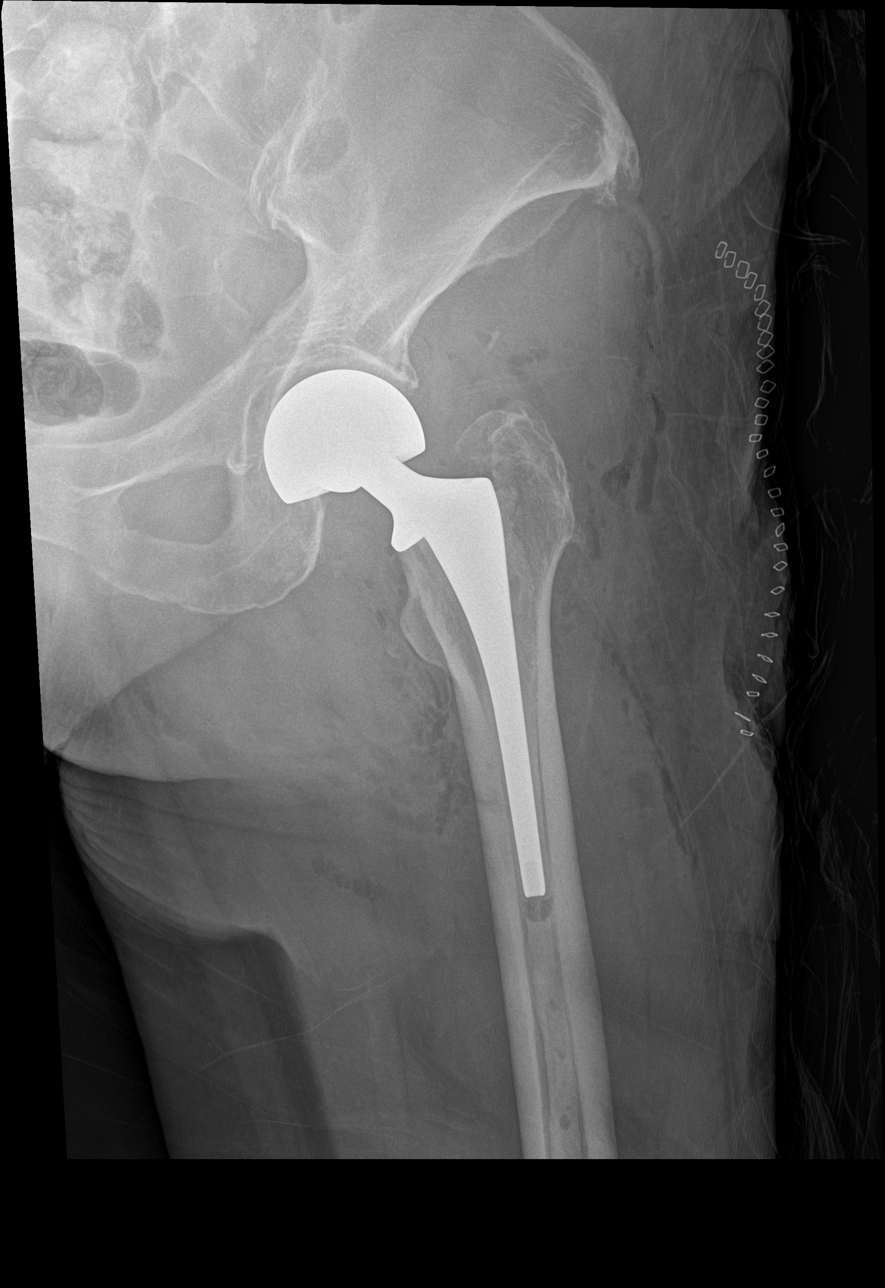

[hip lat]
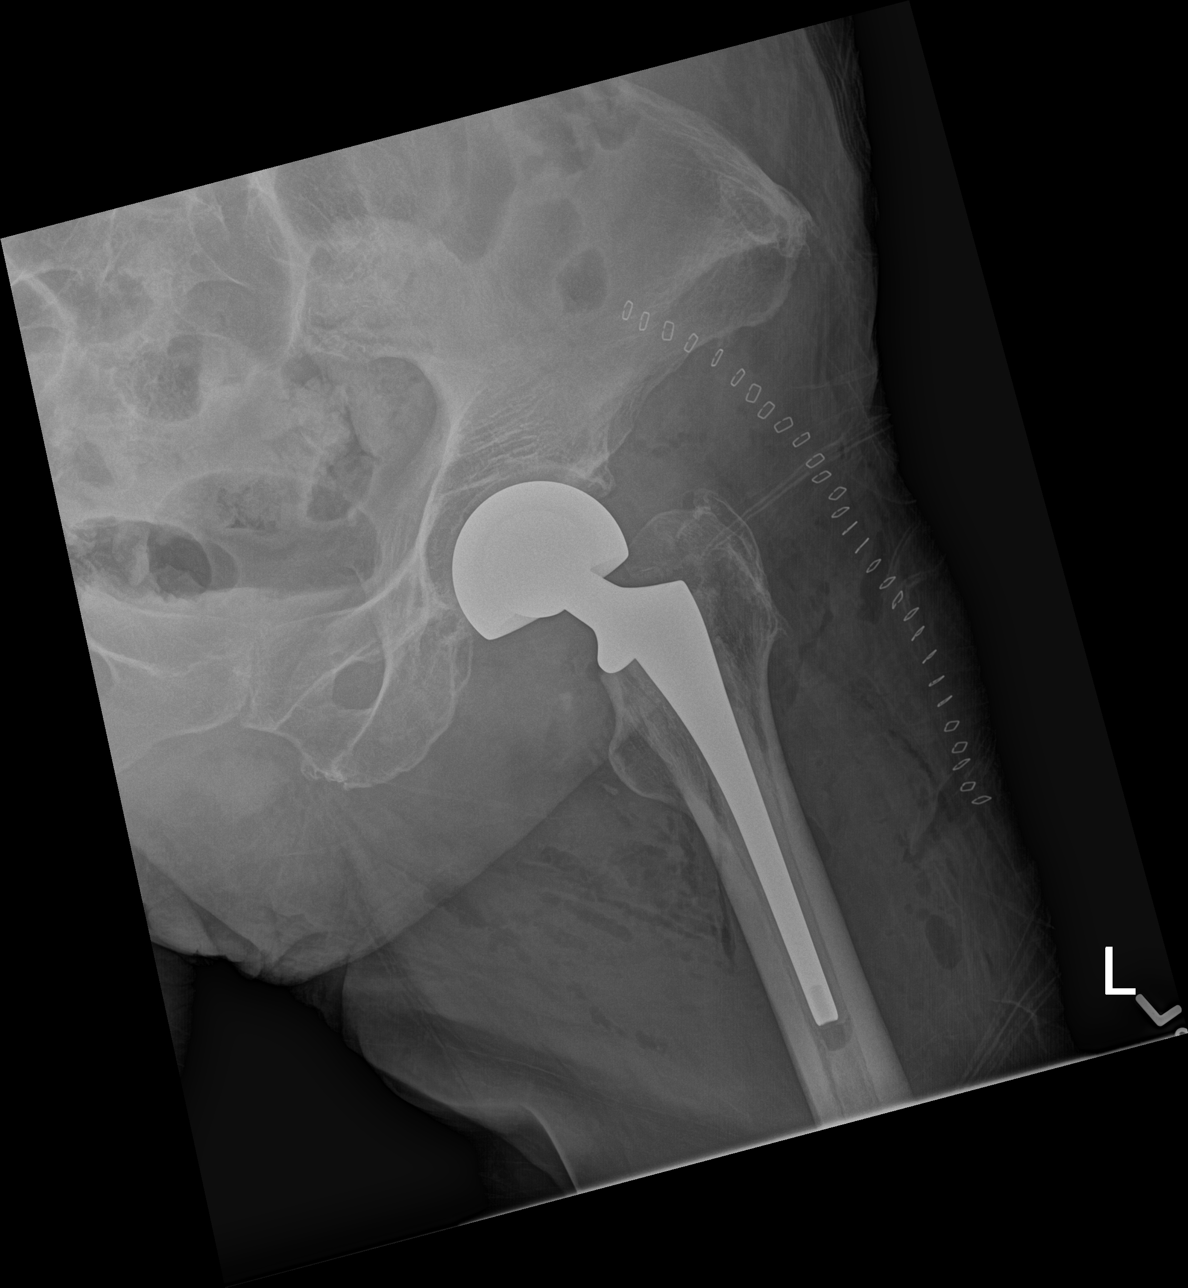

[3 of 3 positions shown; findings below may reference images not displayed]

FINDINGS: Frontal view of the pelvis as well as frontal and frogleg lateral
views of the left hip demonstrate left hip hemiarthroplasty in the
expected position without signs of acute complication. Postsurgical
changes are seen within the overlying soft tissues. The remainder of
the bony pelvis is unremarkable.
IMPRESSION: 1. Unremarkable left hip hemiarthroplasty.

## 2020-02-18 IMAGING — DX DG HIP (WITH OR WITHOUT PELVIS) 2-3V*L*
3 series · 3 of 3 positions shown · non-contrast
Comparison: None.

CLINICAL DATA: 85-year-old female status post fall last night,
found down by neighbor this morning. Left hip pain.

EXAM:
DG HIP (WITH OR WITHOUT PELVIS) 2-3V LEFT

[pelvis ap]
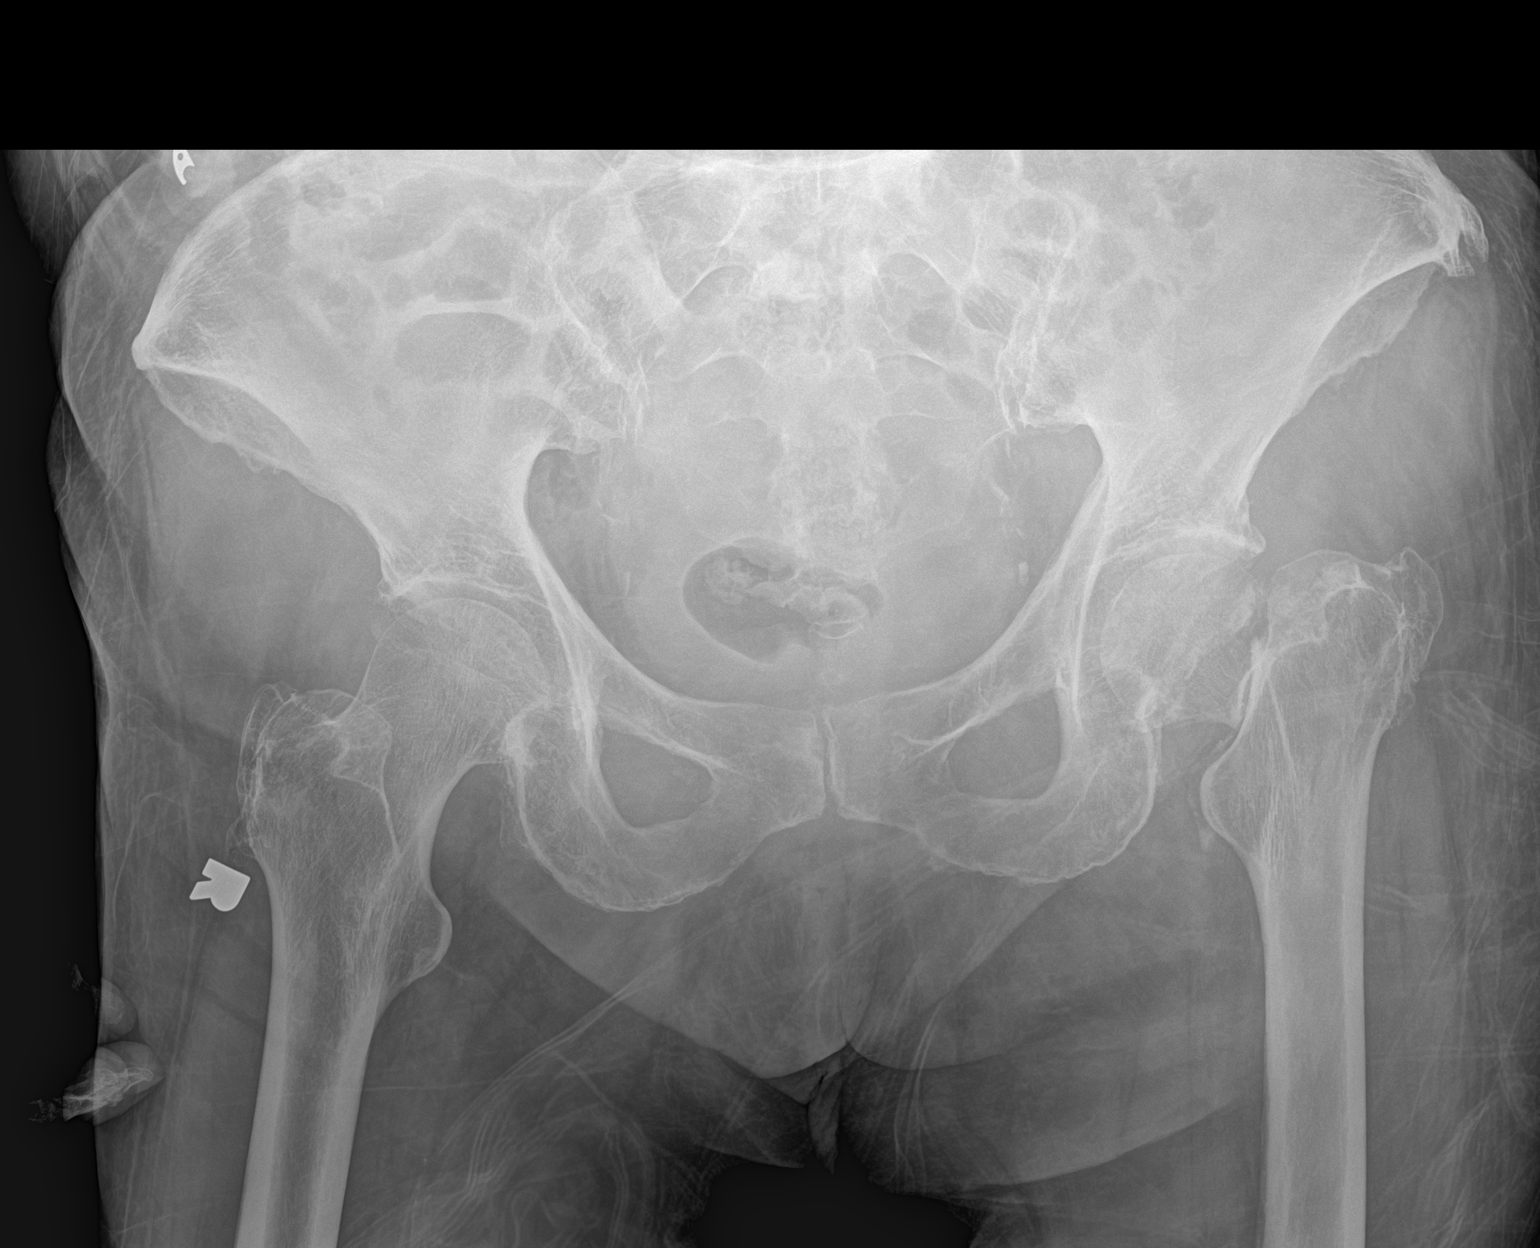

[hip ap]
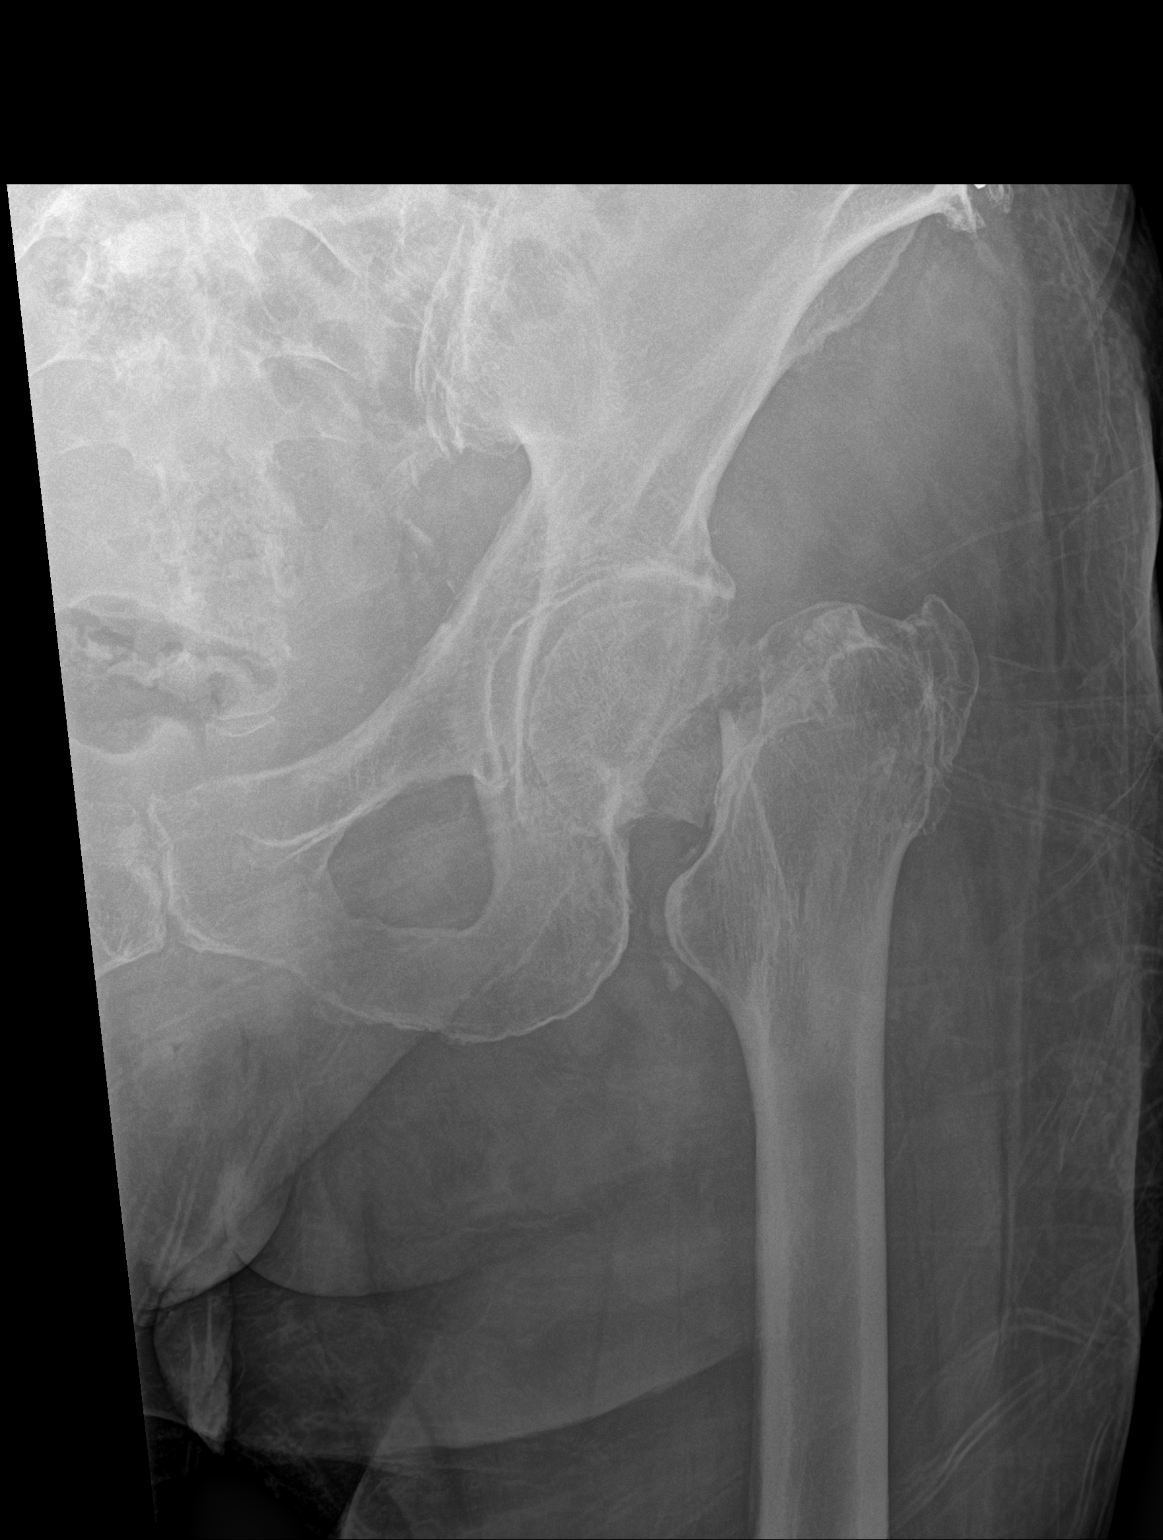

[hip lat]
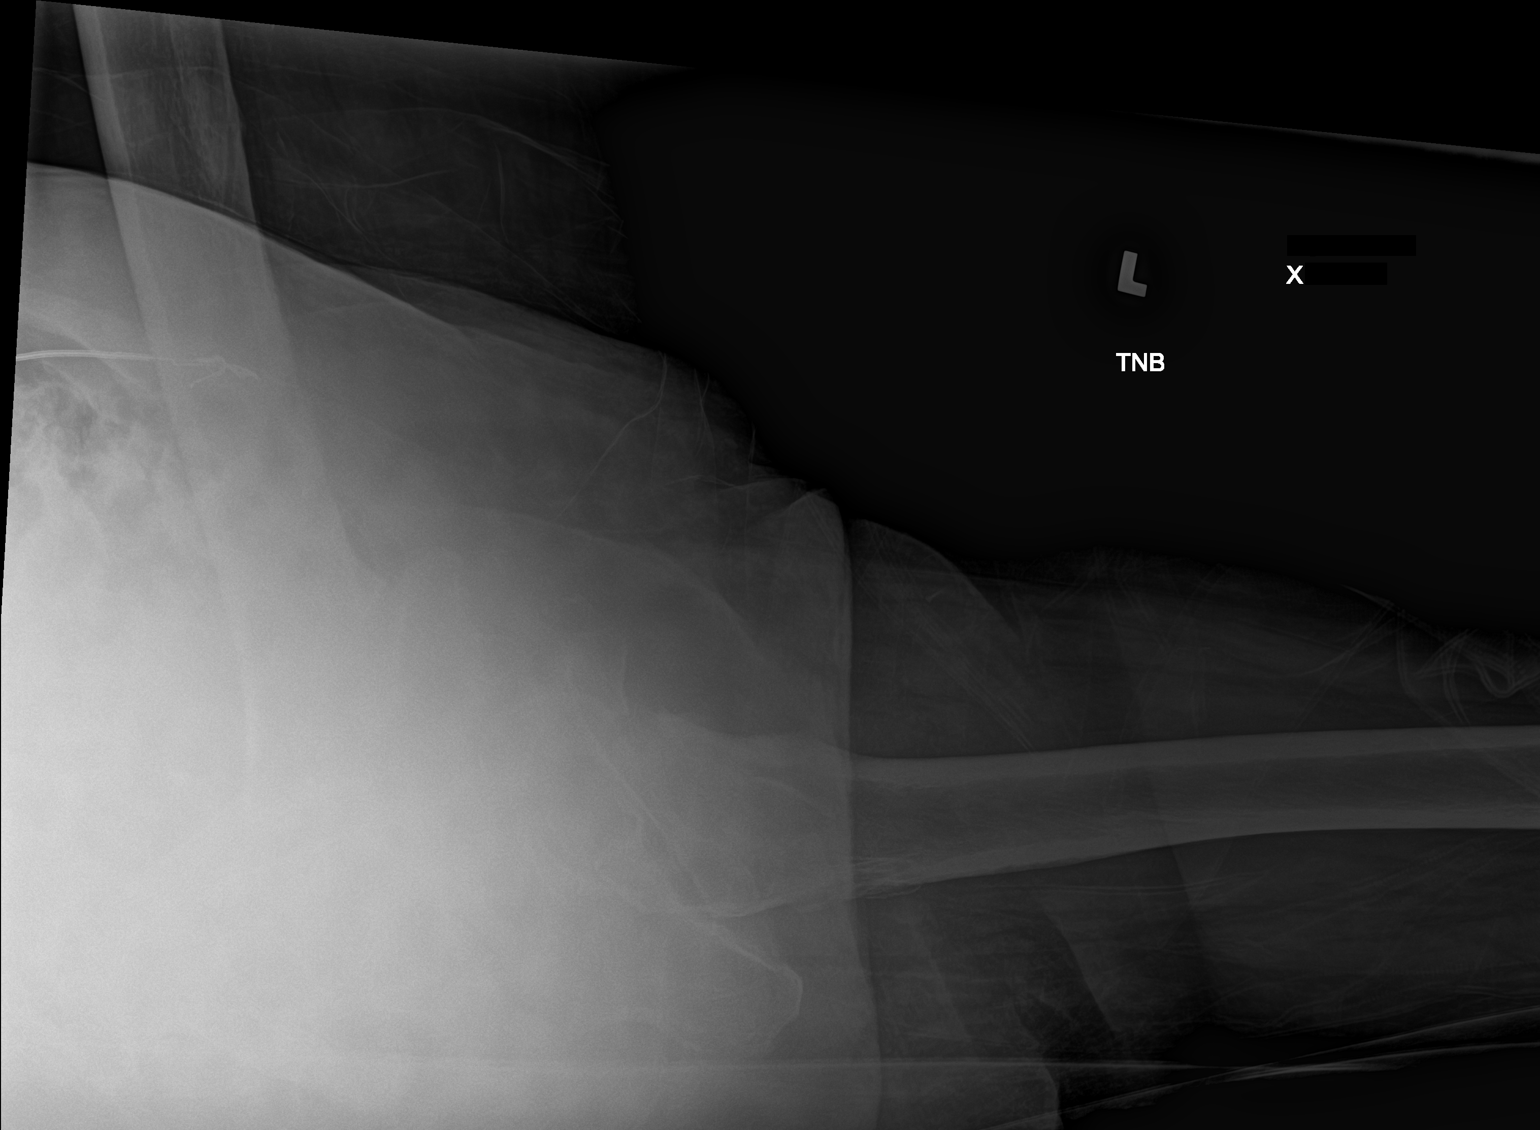

[3 of 3 positions shown; findings below may reference images not displayed]

FINDINGS: Portable views of the pelvis and left hip. Impacted fracture of the
left femoral neck. The intertrochanteric segment appears to remain
intact. Left femoral head normally located.

No superimposed pelvis fracture. Grossly intact proximal right
femur. Bone mineralization is within normal limits for age. Negative
visible bowel gas pattern.
IMPRESSION: Acute impacted fracture of the left femoral neck.

## 2020-02-18 SURGERY — HEMIARTHROPLASTY, HIP, DIRECT ANTERIOR APPROACH, FOR FRACTURE
Anesthesia: Spinal | Site: Hip | Laterality: Left

## 2020-02-18 MED ORDER — PHENYLEPHRINE HCL (PRESSORS) 10 MG/ML IV SOLN
INTRAVENOUS | Status: DC | PRN
  Administered 2020-02-18 (×3): 80 ug via INTRAVENOUS
  Administered 2020-02-18: 40 ug via INTRAVENOUS
  Administered 2020-02-18 (×2): 80 ug via INTRAVENOUS

## 2020-02-18 MED ORDER — 0.9 % SODIUM CHLORIDE (POUR BTL) OPTIME
TOPICAL | Status: DC | PRN
  Administered 2020-02-18: 15:00:00 1000 mL

## 2020-02-18 MED ORDER — BUPIVACAINE-EPINEPHRINE (PF) 0.5% -1:200000 IJ SOLN
INTRAMUSCULAR | Status: AC
  Filled 2020-02-18: qty 30

## 2020-02-18 MED ORDER — TRANEXAMIC ACID-NACL 1000-0.7 MG/100ML-% IV SOLN
INTRAVENOUS | Status: AC
  Filled 2020-02-18: qty 100

## 2020-02-18 MED ORDER — SODIUM CHLORIDE 0.9 % IR SOLN
Status: DC | PRN
  Administered 2020-02-18: 3000 mL

## 2020-02-18 MED ORDER — ONDANSETRON HCL 4 MG/2ML IJ SOLN
4.0000 mg | INTRAMUSCULAR | Status: DC | PRN
  Administered 2020-02-18: 08:00:00 4 mg via INTRAVENOUS
  Filled 2020-02-18: qty 2

## 2020-02-18 MED ORDER — EPHEDRINE 5 MG/ML INJ
INTRAVENOUS | Status: AC
  Filled 2020-02-18: qty 10

## 2020-02-18 MED ORDER — SODIUM CHLORIDE 0.9 % IV SOLN: INTRAVENOUS | Status: DC

## 2020-02-18 MED ORDER — FENTANYL CITRATE (PF) 100 MCG/2ML IJ SOLN
INTRAMUSCULAR | Status: AC
  Filled 2020-02-18: qty 2

## 2020-02-18 MED ORDER — EPHEDRINE SULFATE 50 MG/ML IJ SOLN
INTRAMUSCULAR | Status: DC | PRN
  Administered 2020-02-18: 10 mg via INTRAVENOUS
  Administered 2020-02-18: 3 mg via INTRAVENOUS

## 2020-02-18 MED ORDER — PROPOFOL 10 MG/ML IV BOLUS
INTRAVENOUS | Status: AC
  Filled 2020-02-18: qty 20

## 2020-02-18 MED ORDER — LACTATED RINGERS IV SOLN: INTRAVENOUS | Status: DC

## 2020-02-18 MED ORDER — CHLORHEXIDINE GLUCONATE 0.12 % MT SOLN
15.0000 mL | Freq: Once | OROMUCOSAL | Status: AC
  Administered 2020-02-18: 13:00:00 15 mL via OROMUCOSAL

## 2020-02-18 MED ORDER — PROPOFOL 10 MG/ML IV BOLUS
INTRAVENOUS | Status: DC | PRN
  Administered 2020-02-18: 20 mg via INTRAVENOUS
  Administered 2020-02-18: 30 mg via INTRAVENOUS

## 2020-02-18 MED ORDER — BUPIVACAINE LIPOSOME 1.3 % IJ SUSP
INTRAMUSCULAR | Status: AC
  Filled 2020-02-18: qty 20

## 2020-02-18 MED ORDER — ENOXAPARIN SODIUM 40 MG/0.4ML ~~LOC~~ SOLN
40.0000 mg | SUBCUTANEOUS | Status: DC
  Administered 2020-02-19 – 2020-02-22 (×4): 40 mg via SUBCUTANEOUS
  Filled 2020-02-18 (×4): qty 0.4

## 2020-02-18 MED ORDER — INSULIN ASPART 100 UNIT/ML ~~LOC~~ SOLN
0.0000 [IU] | Freq: Every day | SUBCUTANEOUS | Status: DC
  Administered 2020-02-18 – 2020-02-19 (×2): 2 [IU] via SUBCUTANEOUS

## 2020-02-18 MED ORDER — PHENYLEPHRINE 40 MCG/ML (10ML) SYRINGE FOR IV PUSH (FOR BLOOD PRESSURE SUPPORT)
PREFILLED_SYRINGE | INTRAVENOUS | Status: AC
  Filled 2020-02-18: qty 20

## 2020-02-18 MED ORDER — FENTANYL CITRATE (PF) 100 MCG/2ML IJ SOLN
50.0000 ug | INTRAMUSCULAR | Status: DC | PRN
  Administered 2020-02-18: 50 ug via INTRAVENOUS
  Filled 2020-02-18: qty 2

## 2020-02-18 MED ORDER — AMLODIPINE BESYLATE 5 MG PO TABS
5.0000 mg | ORAL_TABLET | Freq: Every day | ORAL | Status: DC
  Administered 2020-02-18 – 2020-02-22 (×5): 5 mg via ORAL
  Filled 2020-02-18 (×6): qty 1

## 2020-02-18 MED ORDER — SODIUM CHLORIDE 0.9 % IV BOLUS
500.0000 mL | Freq: Once | INTRAVENOUS | Status: AC
  Administered 2020-02-18: 10:00:00 500 mL via INTRAVENOUS

## 2020-02-18 MED ORDER — MORPHINE SULFATE (PF) 2 MG/ML IV SOLN
0.5000 mg | INTRAVENOUS | Status: DC | PRN
  Administered 2020-02-19 (×2): 0.5 mg via INTRAVENOUS
  Filled 2020-02-18 (×2): qty 1

## 2020-02-18 MED ORDER — PROPOFOL 500 MG/50ML IV EMUL
INTRAVENOUS | Status: DC | PRN
  Administered 2020-02-18: 25 ug/kg/min via INTRAVENOUS

## 2020-02-18 MED ORDER — HYDROCODONE-ACETAMINOPHEN 5-325 MG PO TABS
1.0000 | ORAL_TABLET | Freq: Four times a day (QID) | ORAL | Status: DC | PRN
  Administered 2020-02-19: 2 via ORAL
  Administered 2020-02-20: 1 via ORAL
  Administered 2020-02-21: 2 via ORAL
  Administered 2020-02-22: 1 via ORAL
  Filled 2020-02-18 (×2): qty 1
  Filled 2020-02-18 (×2): qty 2

## 2020-02-18 MED ORDER — CEFAZOLIN SODIUM-DEXTROSE 2-4 GM/100ML-% IV SOLN
INTRAVENOUS | Status: AC
  Filled 2020-02-18: qty 100

## 2020-02-18 MED ORDER — CEFAZOLIN SODIUM-DEXTROSE 2-4 GM/100ML-% IV SOLN
2.0000 g | Freq: Three times a day (TID) | INTRAVENOUS | Status: AC
  Administered 2020-02-18 – 2020-02-19 (×3): 2 g via INTRAVENOUS
  Filled 2020-02-18 (×3): qty 100

## 2020-02-18 MED ORDER — FENTANYL CITRATE (PF) 100 MCG/2ML IJ SOLN
INTRAMUSCULAR | Status: DC | PRN
  Administered 2020-02-18 (×3): 25 ug via INTRAVENOUS

## 2020-02-18 MED ORDER — ORAL CARE MOUTH RINSE: 15.0000 mL | Freq: Once | OROMUCOSAL | Status: AC

## 2020-02-18 MED ORDER — SIMVASTATIN 20 MG PO TABS
40.0000 mg | ORAL_TABLET | Freq: Every day | ORAL | Status: DC
  Administered 2020-02-18: 22:00:00 40 mg via ORAL
  Filled 2020-02-18: qty 2

## 2020-02-18 MED ORDER — INSULIN ASPART 100 UNIT/ML ~~LOC~~ SOLN
0.0000 [IU] | Freq: Three times a day (TID) | SUBCUTANEOUS | Status: DC
  Administered 2020-02-19: 5 [IU] via SUBCUTANEOUS
  Administered 2020-02-19: 2 [IU] via SUBCUTANEOUS
  Administered 2020-02-19: 5 [IU] via SUBCUTANEOUS
  Administered 2020-02-20: 3 [IU] via SUBCUTANEOUS
  Administered 2020-02-20: 2 [IU] via SUBCUTANEOUS
  Administered 2020-02-20: 3 [IU] via SUBCUTANEOUS
  Administered 2020-02-21: 2 [IU] via SUBCUTANEOUS
  Administered 2020-02-21: 3 [IU] via SUBCUTANEOUS
  Administered 2020-02-21: 5 [IU] via SUBCUTANEOUS
  Administered 2020-02-22 (×2): 3 [IU] via SUBCUTANEOUS

## 2020-02-18 MED ORDER — LOSARTAN POTASSIUM 50 MG PO TABS
50.0000 mg | ORAL_TABLET | Freq: Every day | ORAL | Status: DC
  Administered 2020-02-18 – 2020-02-22 (×5): 50 mg via ORAL
  Filled 2020-02-18 (×5): qty 1

## 2020-02-18 MED ORDER — CEFAZOLIN SODIUM-DEXTROSE 2-4 GM/100ML-% IV SOLN
2.0000 g | INTRAVENOUS | Status: AC
  Administered 2020-02-18: 14:00:00 2 g via INTRAVENOUS

## 2020-02-18 MED ORDER — POLYETHYLENE GLYCOL 3350 17 G PO PACK
17.0000 g | PACK | Freq: Every day | ORAL | Status: DC | PRN
  Administered 2020-02-21: 17 g via ORAL
  Filled 2020-02-18: qty 1

## 2020-02-18 MED ORDER — CHLORHEXIDINE GLUCONATE CLOTH 2 % EX PADS
6.0000 | MEDICATED_PAD | Freq: Every day | CUTANEOUS | Status: DC
  Administered 2020-02-19 – 2020-02-22 (×4): 6 via TOPICAL

## 2020-02-18 MED ORDER — TRANEXAMIC ACID-NACL 1000-0.7 MG/100ML-% IV SOLN
1000.0000 mg | INTRAVENOUS | Status: AC
  Administered 2020-02-18: 14:00:00 1000 mg via INTRAVENOUS
  Filled 2020-02-18: qty 100

## 2020-02-18 SURGICAL SUPPLY — 71 items
APL PRP STRL LF DISP 70% ISPRP (MISCELLANEOUS) ×1
BALL HIP ARTICU 28 +5 (Hips) IMPLANT
BIPOLAR PROS AML 46 (Hips) ×2 IMPLANT
BIT DRILL 2.8X128 (BIT) ×2 IMPLANT
BLADE SAGITTAL 25.0X1.27X90 (BLADE) ×2 IMPLANT
BRUSH FEMORAL CANAL (MISCELLANEOUS) IMPLANT
CEMENT HV SMART SET (Cement) ×2 IMPLANT
CEMENT RESTRICTOR DEPUY SZ 1 (Cement) ×1 IMPLANT
CHLORAPREP W/TINT 26 (MISCELLANEOUS) ×2 IMPLANT
CLOTH BEACON ORANGE TIMEOUT ST (SAFETY) ×2 IMPLANT
COVER LIGHT HANDLE STERIS (MISCELLANEOUS) ×4 IMPLANT
COVER WAND RF STERILE (DRAPES) ×2 IMPLANT
DECANTER SPIKE VIAL GLASS SM (MISCELLANEOUS) ×2 IMPLANT
DRAPE HIP W/POCKET STRL (MISCELLANEOUS) ×2 IMPLANT
DRAPE U-SHAPE 47X51 STRL (DRAPES) ×2 IMPLANT
DRESSING AQUACEL AG ADV 3.5X12 (MISCELLANEOUS) IMPLANT
DRSG AQUACEL AG ADV 3.5X12 (MISCELLANEOUS) ×2
DRSG MEPILEX BORDER 4X12 (GAUZE/BANDAGES/DRESSINGS) ×2 IMPLANT
DRSG MEPILEX SACRM 8.7X9.8 (GAUZE/BANDAGES/DRESSINGS) ×2 IMPLANT
ELECT REM PT RETURN 9FT ADLT (ELECTROSURGICAL) ×2
ELECTRODE REM PT RTRN 9FT ADLT (ELECTROSURGICAL) ×1 IMPLANT
GLOVE BIOGEL PI IND STRL 7.0 (GLOVE) ×3 IMPLANT
GLOVE BIOGEL PI IND STRL 8 (GLOVE) ×1 IMPLANT
GLOVE BIOGEL PI INDICATOR 7.0 (GLOVE) ×6
GLOVE BIOGEL PI INDICATOR 8 (GLOVE) ×1
GLOVE ECLIPSE 6.5 STRL STRAW (GLOVE) ×1 IMPLANT
GLOVE SKINSENSE NS SZ8.0 LF (GLOVE) ×4
GLOVE SKINSENSE STRL SZ8.0 LF (GLOVE) ×4 IMPLANT
GOWN STRL REUS W/ TWL XL LVL3 (GOWN DISPOSABLE) ×1 IMPLANT
GOWN STRL REUS W/TWL LRG LVL3 (GOWN DISPOSABLE) ×6 IMPLANT
GOWN STRL REUS W/TWL XL LVL3 (GOWN DISPOSABLE) ×2
HANDPIECE INTERPULSE COAX TIP (DISPOSABLE) ×2
HEAD BIPOLAR PROS AML 46 (Hips) IMPLANT
HIP BALL ARTICU 28 +5 (Hips) ×2 IMPLANT
INST SET MAJOR BONE (KITS) ×2 IMPLANT
KIT BLADEGUARD II DBL (SET/KITS/TRAYS/PACK) ×2 IMPLANT
KIT TURNOVER KIT A (KITS) ×2 IMPLANT
MANIFOLD NEPTUNE II (INSTRUMENTS) ×2 IMPLANT
MARKER SKIN DUAL TIP RULER LAB (MISCELLANEOUS) ×2 IMPLANT
NDL HYPO 18GX1.5 BLUNT FILL (NEEDLE) ×1 IMPLANT
NDL HYPO 21X1.5 SAFETY (NEEDLE) ×1 IMPLANT
NEEDLE HYPO 18GX1.5 BLUNT FILL (NEEDLE) ×2 IMPLANT
NEEDLE HYPO 21X1.5 SAFETY (NEEDLE) ×2 IMPLANT
NS IRRIG 1000ML POUR BTL (IV SOLUTION) ×2 IMPLANT
PACK SURGICAL SETUP 50X90 (CUSTOM PROCEDURE TRAY) ×1 IMPLANT
PACK TOTAL JOINT (CUSTOM PROCEDURE TRAY) ×2 IMPLANT
PAD ARMBOARD 7.5X6 YLW CONV (MISCELLANEOUS) ×2 IMPLANT
PASSER SUT SWANSON 36MM LOOP (INSTRUMENTS) IMPLANT
PENCIL SMOKE EVACUATOR (MISCELLANEOUS) ×2 IMPLANT
PILLOW HIP ABDUCTION LRG (ORTHOPEDIC SUPPLIES) ×1 IMPLANT
PIN STMN SNGL STERILE 9X3.6MM (PIN) ×4 IMPLANT
SET BASIN LINEN APH (SET/KITS/TRAYS/PACK) ×2 IMPLANT
SET HNDPC FAN SPRY TIP SCT (DISPOSABLE) ×1 IMPLANT
STAPLER VISISTAT (STAPLE) ×1 IMPLANT
STEM DIST FEM CENTRALIZR 13 (Hips) ×1 IMPLANT
STEM SUMMIT CEMENTED BASIC SZ3 (Hips) IMPLANT
SUMMIT CEMENT BASIC SZ3 (Hips) ×2 IMPLANT
SUT BRALON NAB BRD #1 30IN (SUTURE) ×4 IMPLANT
SUT ETHIBOND 5 LR DA (SUTURE) ×2 IMPLANT
SUT MNCRL 0 VIOLET CTX 36 (SUTURE) ×1 IMPLANT
SUT MON AB 2-0 CT1 36 (SUTURE) ×3 IMPLANT
SUT MONOCRYL 0 CTX 36 (SUTURE) ×2
SUT VIC AB 1 CT1 27 (SUTURE) ×10
SUT VIC AB 1 CT1 27XBRD ANTBC (SUTURE) ×2 IMPLANT
SYR 20ML LL LF (SYRINGE) ×6 IMPLANT
SYR BULB IRRIG 60ML STRL (SYRINGE) ×2 IMPLANT
TOWER CARTRIDGE SMART MIX (DISPOSABLE) ×1 IMPLANT
TRAY FOLEY MTR SLVR 16FR STAT (SET/KITS/TRAYS/PACK) ×2 IMPLANT
WATER STERILE IRR 1000ML POUR (IV SOLUTION) ×3 IMPLANT
YANKAUER SUCT 12FT TUBE ARGYLE (SUCTIONS) ×3 IMPLANT
YANKAUER SUCT BULB TIP 10FT TU (MISCELLANEOUS) ×1 IMPLANT

## 2020-02-18 NOTE — ED Provider Notes (Signed)
[facility] EMERGENCY DEPARTMENT Provider Note   CSN: [id number] Arrival date & time: 02/18/20  [id number]     History Chief Complaint  Patient presents with  . Hip Pain    Jenna Long is a 85 y.o. female.  HPI She reports falling at 4 PM yesterday, after checking her oil tank, while climbing down off a cinder block.  Afterwards she was able to stand but unable to walk because of left hip pain.  She sat down, then stayed at that location, until earlier this morning when she was found by her neighbor, who summoned EMS.  She was transferred here, without intervention by EMS.  She complains only of pain in her left hip.  She has been taking her medications regularly, but did not take any last evening or this morning.  She denies recent illnesses.  There are no other known modifying factors.  Past Medical History:  Diagnosis Date  . Diabetes mellitus type II   . Hypercholesteremia   . Hypertension     There are no problems to display for this patient.   Past Surgical History:  Procedure Laterality Date  . ABDOMINAL HYSTERECTOMY  1991   [facility], [place]  . [place]   left, [facility]  . CATARACT EXTRACTION W/ INTRAOCULAR LENS IMPLANT  06/10/06    Right, [facility], [place]  . CATARACT EXTRACTION W/PHACO  09/18/2010   Procedure: CATARACT EXTRACTION PHACO AND INTRAOCULAR LENS PLACEMENT (IOC);  Surgeon: Williams Che;  Location: [facility];  Service: Ophthalmology;  Laterality: Left;  . COLONOSCOPY W/ POLYPECTOMY  06/20/10   [facility], [facility]     OB History   No obstetric history on file.     Family History  Problem Relation Age of Onset  . Anesthesia problems Neg Hx     Social History   Tobacco Use  . Smoking status: Never Smoker  . Smokeless tobacco: Never Used  Substance Use Topics  . Alcohol use: No  . Drug use: No    Home Medications Prior to Admission medications   Medication Sig Start Date End Date Taking? Authorizing Provider  amLODipine (NORVASC) 5 MG tablet Take 5 mg  by mouth daily.      [provider]  losartan (COZAAR) 50 MG tablet Take 50 mg by mouth daily.      [provider]  metFORMIN (GLUCOPHAGE) 1000 MG tablet Take 1,000 mg by mouth 2 (two) times daily with a meal.      [provider]  simvastatin (ZOCOR) 40 MG tablet Take 40 mg by mouth daily.      [provider]    Allergies    Patient has no known allergies.  Review of Systems   Review of Systems  All other systems reviewed and are negative.   Physical Exam Updated Vital Signs BP (!) 169/72   Pulse 91   Temp 97.9 F (36.6 C) (Oral)   Resp 20   Ht 5\' 7"  (1.702 m)   Wt 68.5 kg   SpO2 98%   BMI 23.65 kg/m   Physical Exam Vitals and nursing note reviewed.  Constitutional:      General: She is not in acute distress.    Appearance: She is well-developed and well-nourished. She is not ill-appearing, toxic-appearing or diaphoretic.  HENT:     Head: Normocephalic and atraumatic.     Right Ear: External ear normal.     Left Ear: External ear normal.  Eyes:     Extraocular  Movements: EOM normal.     Conjunctiva/sclera: Conjunctivae normal.     Pupils: Pupils are equal, round, and reactive to light.  Neck:     Trachea: Phonation normal.  Cardiovascular:     Rate and Rhythm: Normal rate and regular rhythm.     Heart sounds: Normal heart sounds.  Pulmonary:     Effort: Pulmonary effort is normal. No respiratory distress.     Breath sounds: Normal breath sounds.  Chest:     Chest wall: No tenderness or bony tenderness.  Abdominal:     General: There is no distension.     Palpations: Abdomen is soft.     Tenderness: There is no abdominal tenderness.  Musculoskeletal:     Cervical back: Normal range of motion and neck supple.     Comments: She guards against movement of the left hip secondary to pain.  Left leg is shortened and externally rotated.  Arms and right leg have normal range of motion and are without deformity.  Skin:     General: Skin is warm, dry and intact.  Neurological:     Mental Status: She is alert and oriented to person, place, and time.     Cranial Nerves: No cranial nerve deficit.     Sensory: No sensory deficit.     Motor: No abnormal muscle tone.     Coordination: Coordination normal.  Psychiatric:        Mood and Affect: Mood and affect and mood normal.        Behavior: Behavior normal.        Thought Content: Thought content normal.        Judgment: Judgment normal.     ED Results / Procedures / Treatments   Labs (all labs ordered are listed, but only abnormal results are displayed) Labs Reviewed  RESP PANEL BY RT-PCR (FLU A&B, COVID) [id number]  COMPREHENSIVE METABOLIC PANEL  CBC WITH DIFFERENTIAL/PLATELET  CK  TYPE AND SCREEN    EKG EKG Interpretation  Date/Time:  Thursday February 18 2020 08:12:20 EST Ventricular Rate:  94 PR Interval:    QRS Duration: 93 QT Interval:  380 QTC Calculation: 476 R Axis:   11 Text Interpretation: Sinus rhythm since last tracing no significant change Confirmed by Mancel Bale [phone]) on 02/18/2020 8:39:49 AM   Radiology DG Chest Port 1 View  Result Date: 02/18/2020 CLINICAL DATA:  85 year old female status post fall last night, found down by neighbor this morning. Left hip fracture. EXAM: PORTABLE CHEST 1 VIEW COMPARISON:  None. FINDINGS: Portable AP semi upright view at 0810 hours. Somewhat low lung volumes. Mediastinal contours within normal limits. Visualized tracheal air column is within normal limits. Allowing for portable technique the lungs are clear. Negative visible bowel gas pattern. No acute osseous abnormality identified. IMPRESSION: No acute cardiopulmonary abnormality. Electronically Signed   By: Odessa Fleming M.D.   On: 02/18/2020 08:22   DG Hip Unilat With Pelvis 2-3 Views Left  Result Date: 02/18/2020 CLINICAL DATA:  85 year old female status post fall last night, found down by neighbor this morning. Left hip pain. EXAM: DG HIP  (WITH OR WITHOUT PELVIS) 2-3V LEFT COMPARISON:  None. FINDINGS: Portable views of the pelvis and left hip. Impacted fracture of the left femoral neck. The intertrochanteric segment appears to remain intact. Left femoral head normally located. No superimposed pelvis fracture. Grossly intact proximal right femur. Bone mineralization is within normal limits for age. Negative visible bowel gas pattern. IMPRESSION: Acute impacted fracture of the left  femoral neck. Electronically Signed   By: Genevie Ann M.D.   On: 02/18/2020 08:21    Procedures Procedures (including critical care time)  Medications Ordered in ED Medications  0.9 %  sodium chloride infusion ( Intravenous New Bag/Given 02/18/20 0756)  fentaNYL (SUBLIMAZE) injection 50 mcg (50 mcg Intravenous Given 02/18/20 0756)  ondansetron (ZOFRAN) injection 4 mg (4 mg Intravenous Given 02/18/20 0756)    ED Course  I have reviewed the triage vital signs and the nursing notes.  Pertinent labs & imaging results that were available during my care of the patient were reviewed by me and considered in my medical decision making (see chart for details).  Clinical Course as of 02/18/20 1029  Thu Feb 18, 2020  0841 Films reviewed, left hip subcapital fracture, will require operative intervention.  Requested callback from orthopedics to arrange for treatment.  Anticipate hospitalist admission. [EW]  1028 Orthopedics called back at this time, after delay.  He states he will plan on operative intervention tomorrow. [EW]    Clinical Course User Index [EW] Daleen Bo, MD   MDM Rules/Calculators/A&P                           Patient Vitals for the past 24 hrs:  BP Temp Temp src Pulse Resp SpO2 Height Weight  02/18/20 0800 (!) 169/72 -- -- 91 20 98 % -- --  02/18/20 0720 -- -- -- -- -- -- 5\' 7"  (1.702 m) 68.5 kg  02/18/20 0710 (!) 176/90 97.9 F (36.6 C) Oral 94 18 97 % -- --    10:43 AM Reevaluation with update and discussion. After initial  assessment and treatment, an updated evaluation reveals no change in clinical status, findings discussed and questions answered. Daleen Bo   Medical Decision Making:  This patient is presenting for evaluation of left hip pain after fall, yesterday, which does require a range of treatment options, and is a complaint that involves a high risk of morbidity and mortality. The differential diagnoses include fracture, contusion, dislocation. I decided to review old records, and in summary elderly female, fell yesterday, mechanical fall injuring only left hip.  I did not require additional historical information from anyone.  Clinical Laboratory Tests Ordered, included CBC, Metabolic panel and Screening Covid and flu tests. Review indicates CK high, glucose high, BUN high, white count low, hemoglobin low. Radiologic Tests Ordered, included left hip x-ray.  Chest x-ray I independently Visualized: Radiograph images, which show left hip fracture, chest x-ray normal  Cardiac Monitor Tracing which shows normal sinus rhythm    Critical Interventions-clinical evaluation, laboratory testing, radiography, observation and reassessment.  Treatment with IV fluid bolus and maintenance.  Patient kept n.p.o. for possible surgical management.  After These Interventions, the Patient was reevaluated and was found to require hospitalization for management of traumatic injury to left hip/fracture.  Mild rhabdomyolysis, indicated by elevated CK, treated with IV fluids.  CRITICAL CARE-no Performed by: Daleen Bo  Nursing Notes Reviewed/ Care Coordinated Applicable Imaging Reviewed Interpretation of Laboratory Data incorporated into ED treatment  10:29 AM-Consult complete with hospitalist. Patient case explained and discussed. He agrees to admit patient for further evaluation and treatment. Call ended at 10:40 AM  Plan: Admit   Final Clinical Impression(s) / ED Diagnoses Final diagnoses:  Closed fracture  of left hip, initial encounter ([place])  Fall, initial encounter    Rx / DC Orders ED Discharge Orders    None  Daleen Bo, MD 02/18/20 1044

## 2020-02-18 NOTE — Anesthesia Postprocedure Evaluation (Signed)
Anesthesia Post Note  Patient: Jenna Long  Procedure(s) Performed: ARTHROPLASTY BIPOLAR HIP (HEMIARTHROPLASTY) (Left Hip)  Patient location during evaluation: PACU Anesthesia Type: Spinal Level of consciousness: awake and alert and oriented Pain management: pain level controlled Vital Signs Assessment: post-procedure vital signs reviewed and stable Respiratory status: spontaneous breathing Cardiovascular status: blood pressure returned to baseline and stable Postop Assessment: no apparent nausea or vomiting Anesthetic complications: no   No complications documented.   Last Vitals:  Vitals:   02/18/20 1100 02/18/20 1133  BP: (!) 177/68 (!) 176/71  Pulse: 99 (!) 102  Resp: 15 16  Temp:  37.2 C  SpO2: 97% 97%    Last Pain:  Vitals:   02/18/20 1133  TempSrc: Oral  PainSc: 0-No pain                 Dalasia Predmore

## 2020-02-18 NOTE — Anesthesia Preprocedure Evaluation (Signed)
Anesthesia Evaluation  Patient identified by MRN, date of birth, ID band Patient awake    Reviewed: Allergy & Precautions, H&P , NPO status , Patient's Chart, lab work & pertinent test results, reviewed documented beta blocker date and time   Airway Mallampati: II  TM Distance: >3 FB Neck ROM: full    Dental no notable dental hx. (+) Teeth Intact   Pulmonary neg pulmonary ROS,    Pulmonary exam normal breath sounds clear to auscultation       Cardiovascular Exercise Tolerance: Good hypertension, negative cardio ROS   Rhythm:regular Rate:Normal     Neuro/Psych negative neurological ROS  negative psych ROS   GI/Hepatic negative GI ROS, Neg liver ROS,   Endo/Other  negative endocrine ROSdiabetes, Type 2  Renal/GU negative Renal ROS  negative genitourinary   Musculoskeletal   Abdominal   Peds  Hematology negative hematology ROS (+)   Anesthesia Other Findings   Reproductive/Obstetrics negative OB ROS                             Anesthesia Physical Anesthesia Plan  ASA: II and emergent  Anesthesia Plan: Spinal   Post-op Pain Management:    Induction:   PONV Risk Score and Plan:   Airway Management Planned:   Additional Equipment:   Intra-op Plan:   Post-operative Plan:   Informed Consent: I have reviewed the patients History and Physical, chart, labs and discussed the procedure including the risks, benefits and alternatives for the proposed anesthesia with the patient or authorized representative who has indicated his/her understanding and acceptance.     Dental Advisory Given  Plan Discussed with: CRNA  Anesthesia Plan Comments:         Anesthesia Quick Evaluation

## 2020-02-18 NOTE — ED Triage Notes (Signed)
Pt slipped and fell off sidewalk last night while checking oil drum approx 7 pm last night. Pt was only able to scoot self, so laid outside all night. Found by neighbor and called EMS. Noted to have shortening of left leg and rotation. Pulse found by [place].  Noted to have bruise left clavicle

## 2020-02-18 NOTE — Consult Note (Signed)
ORTHOPAEDIC CONSULTATION  REQUESTING PHYSICIAN: Daleen Bo, MD  ASSESSMENT AND PLAN: 85 y.o. female with the following: Left Hip Displaced femoral neck fracture  This patient requires inpatient admission to the hospitalist, to include preoperative clearance and perioperative medical management  - Weight Bearing Status/Activity: NWB Left lower extremity  - Additional recommended labs/tests: Preop Labs: CBC, BMP, PT/INR, Chest XR and EKG  -VTE Prophylaxis: Please hold prior to OR; to resume POD#1 at the discretion of the primary team  - Pain control: Recommend PO pain medications PRN; judicious use of narcotics  - Follow-up plan: F/u 10-14 days postop  -Procedures: Plan for OR once patient has been medically optimized  Plan for Left Hip hemiarthroplasty   DOS: 02/18/20     Chief Complaint: Left hip pain  HPI: Jenna Long is a 85 y.o. female who presented to the ED for evaluation after sustaining a mechanical fall.  She is complaining of Left hip pain.  She went out last night to check an oil drum for her furnace and fell.  She was found this morning on the ground.  She has some bruises on her body, but no pain elsewhere.  She did not hit her head.   Past Medical History:  Diagnosis Date  . Diabetes mellitus type II   . Hypercholesteremia   . Hypertension    Past Surgical History:  Procedure Laterality Date  . ABDOMINAL HYSTERECTOMY  1991   [facility], [place]  . [place]   left, [facility]  . CATARACT EXTRACTION W/ INTRAOCULAR LENS IMPLANT  06/10/06    Right, [facility], [place]  . CATARACT EXTRACTION W/PHACO  09/18/2010   Procedure: CATARACT EXTRACTION PHACO AND INTRAOCULAR LENS PLACEMENT (IOC);  Surgeon: Williams Che;  Location: [facility];  Service: Ophthalmology;  Laterality: Left;  . COLONOSCOPY W/ POLYPECTOMY  06/20/10   [facility], [facility]   Social History   Socioeconomic History  . Marital status: Widowed    Spouse name: Not on file  . Number of children: Not on  file  . Years of education: Not on file  . Highest education level: Not on file  Occupational History  . Not on file  Tobacco Use  . Smoking status: Never Smoker  . Smokeless tobacco: Never Used  Substance and Sexual Activity  . Alcohol use: No  . Drug use: No  . Sexual activity: Not on file  Other Topics Concern  . Not on file  Social History Narrative  . Not on file   Social Determinants of Health   Financial Resource Strain: Not on file  Food Insecurity: Not on file  Transportation Needs: Not on file  Physical Activity: Not on file  Stress: Not on file  Social Connections: Not on file   Family History  Problem Relation Age of Onset  . Anesthesia problems Neg Hx    No Known Allergies Prior to Admission medications   Medication Sig Start Date End Date Taking? Authorizing Provider  amLODipine (NORVASC) 5 MG tablet Take 5 mg by mouth daily.      [provider]  losartan (COZAAR) 50 MG tablet Take 50 mg by mouth daily.      [provider]  metFORMIN (GLUCOPHAGE) 1000 MG tablet Take 1,000 mg by mouth 2 (two) times daily with a meal.      [provider]  simvastatin (ZOCOR) 40 MG tablet Take 40 mg by mouth daily.      [provider]   [facility]  1 View  Result Date: 02/18/2020 CLINICAL DATA:  85 year old female status post fall last night, found down by neighbor this morning. Left hip fracture. EXAM: PORTABLE CHEST 1 VIEW COMPARISON:  None. FINDINGS: Portable AP semi upright view at 0810 hours. Somewhat low lung volumes. Mediastinal contours within normal limits. Visualized tracheal air column is within normal limits. Allowing for portable technique the lungs are clear. Negative visible bowel gas pattern. No acute osseous abnormality identified. IMPRESSION: No acute cardiopulmonary abnormality. Electronically Signed   By: Genevie Ann M.D.   On: 02/18/2020 08:22   DG Hip Unilat With Pelvis 2-3 Views Left  Result Date: 02/18/2020 CLINICAL  DATA:  85 year old female status post fall last night, found down by neighbor this morning. Left hip pain. EXAM: DG HIP (WITH OR WITHOUT PELVIS) 2-3V LEFT COMPARISON:  None. FINDINGS: Portable views of the pelvis and left hip. Impacted fracture of the left femoral neck. The intertrochanteric segment appears to remain intact. Left femoral head normally located. No superimposed pelvis fracture. Grossly intact proximal right femur. Bone mineralization is within normal limits for age. Negative visible bowel gas pattern. IMPRESSION: Acute impacted fracture of the left femoral neck. Electronically Signed   By: Genevie Ann M.D.   On: 02/18/2020 08:21   Family History Reviewed and non-contributory, no pertinent history of problems with bleeding or anesthesia    Review of Systems No fevers or chills No chest pain No shortness of breath No bowel or bladder dysfunction No GI distress No headaches    OBJECTIVE  Vitals: Patient Vitals for the past 8 hrs:  BP Temp Temp src Pulse Resp SpO2 Height Weight  02/18/20 1000 (!) 159/72 -- -- 88 20 96 % -- --  02/18/20 0930 (!) 157/79 -- -- 88 16 97 % -- --  02/18/20 0845 (!) 155/74 -- -- 94 (!) 21 96 % -- --  02/18/20 0800 (!) 169/72 -- -- 91 20 98 % -- --  02/18/20 0720 -- -- -- -- -- -- _0  (1.702 m) 68.5 kg  02/18/20 0710 (!) 176/90 97.9 F (36.6 C) Oral 94 18 97 % -- --   General: Alert, no acute distress Cardiovascular: Extremities are warm Respiratory: No cyanosis, no use of accessory musculature Skin: No lesions in the area of chief complaint  Neurologic: Decreased sensation to left foot  Psychiatric: Patient is competent for consent with normal mood and affect Lymphatic: No swelling obvious and reported other than the area involved in the exam below Extremities  LLE: Extremity held in a fixed position.  ROM deferred due to known fracture.  2+ DP pulse.  Toes are WWP.  Active motion intact in the TA/EHL/GS. RLE: Sensation is intact distally in  the sural, saphenous, DP, SP, and plantar nerve distribution. 2+ DP pulse.  Toes are WWP.  Active motion intact in the TA/EHL/GS. Tolerates gentle ROM of the hip.  No pain with axial loading.     Test Results Imaging XR of the Left hip demonstrates a Displaced femoral neck fracture.  Labs cbc Recent Labs    02/18/20 0833  WBC 11.0*  HGB 11.8*  HCT 34.4*  PLT 246    Labs inflam No results for input(s): CRP in the last 72 hours.  Invalid input(s): ESR  Labs coag No results for input(s): INR, PTT in the last 72 hours.  Invalid input(s): PT  Recent Labs    02/18/20 0833  NA 136  K 4.2  CL 102  CO2 22  GLUCOSE 372*  BUN 25*  CREATININE 0.74  CALCIUM 9.3

## 2020-02-18 NOTE — Anesthesia Procedure Notes (Signed)
Spinal  Patient location during procedure: OR Preanesthetic Checklist Completed: patient identified, IV checked, site marked, risks and benefits discussed, surgical consent, monitors and equipment checked, pre-op evaluation and timeout performed Spinal Block Patient position: left lateral decubitus Prep: Betadine Patient monitoring: heart rate, cardiac monitor, continuous pulse ox and blood pressure Approach: left paramedian Location: L3-4 Injection technique: single-shot Needle Needle type: Spinocan  Needle gauge: 22 G Needle length: 9 cm Assessment Sensory level: T8 Additional Notes  ATTEMPTS:1 TRAY QP:[id number] TRAY EXPIRATION DATE:04/18/2021

## 2020-02-18 NOTE — Brief Op Note (Signed)
02/18/2020  4:54 PM  PATIENT:  Jenna Long  85 y.o. female  PRE-OPERATIVE DIAGNOSIS:  Left femoral neck fracture  POST-OPERATIVE DIAGNOSIS:  Left femoral neck fracture  PROCEDURE:  Procedure(s): ARTHROPLASTY BIPOLAR HIP (HEMIARTHROPLASTY) (Left)  SURGEON:  Surgeon(s) and Role:    Oliver Barre, MD - Primary  PHYSICIAN ASSISTANT:   ASSISTANTS: Cecile Sheerer    ANESTHESIA:   epidural and IV sedation  EBL:  150 mL   BLOOD ADMINISTERED:none  DRAINS: none   LOCAL MEDICATIONS USED:  NONE  SPECIMEN:  No Specimen  DISPOSITION OF SPECIMEN:  N/A  COUNTS:  YES  TOURNIQUET:  * No tourniquets in log *  DICTATION: .Note written in EPIC  PLAN OF CARE: Admit to inpatient   PATIENT DISPOSITION:  PACU - hemodynamically stable.   Delay start of Pharmacological VTE agent (>24hrs) due to surgical blood loss or risk of bleeding: no

## 2020-02-18 NOTE — H&P (Signed)
History and Physical  Jenna Long HKV:[id number] DOB: [birth year] DOA: 02/18/2020   PCP: Sharilyn Sites, MD   Patient coming from: Home  Chief Complaint: fall, left hip pain  HPI:  Jenna Long is a 85 y.o. female with medical history of diabetes mellitus type 2, hypertension, hyperlipidemia presenting after mechanical fall.  The patient was outside checking on her kerosene oil.  The patient sustained a mechanical fall while stepping off a cinderblock and fell onto her left side.  She tried to stand up but was unable to do that nor bear weight onto her left leg.  She subsequently crawled back to her front porch where she stayed through the night.  The patient denied any loss of consciousness.  She denied any prodromal symptoms.  On the morning of 02/18/2020, the patient was able to get the attention of her neighbor who subsequently contacted EMS to bring the patient to the hospital for further evaluation. In the emergency department, the patient was afebrile and hemodynamically stable with oxygen saturation 97% room air.  BMP and LFTs were unremarkable.  WBC 11.0, hemoglobin 11.8, platelets 246,000.  Chest x-ray was negative.  EKG was sinus rhythm with no ST-T wave changes.  X-ray of the left hip showed an acute impacted left femoral neck fracture.  Orthopedics was consulted. Notably, the patient denies any previous history of MI or stroke.  She states that she walks at least one quarter of a mile daily without any chest discomfort or shortness of breath.  Assessment/Plan: Acute left femoral neck fracture -Orthopedics consulted--> planning for operative repair 02/18/2020 -The patient is medically stable for surgery at this time without any further interventions or diagnostic considerations -Personally reviewed EKG--sinus rhythm, no ST ST wave changes -Personally reviewed chest x-ray--no infiltrates or edema  Essential hypertension -Restart amlodipine and losartan -BP elevated at this  time as the patient has not had her antihypertensive medications  Diabetes mellitus type 2 -Holding Metformin -Check hemoglobin A1c -NovoLog sliding scale  Hyperlipidemia -Continue statin        Past Medical History:  Diagnosis Date  . Diabetes mellitus type II   . Hypercholesteremia   . Hypertension    Past Surgical History:  Procedure Laterality Date  . ABDOMINAL HYSTERECTOMY  1991   [facility], [place]  . [place]   left, [facility]  . CATARACT EXTRACTION W/ INTRAOCULAR LENS IMPLANT  06/10/06    Right, [facility], [place]  . CATARACT EXTRACTION W/PHACO  09/18/2010   Procedure: CATARACT EXTRACTION PHACO AND INTRAOCULAR LENS PLACEMENT (IOC);  Surgeon: Williams Che;  Location: [facility];  Service: Ophthalmology;  Laterality: Left;  . COLONOSCOPY W/ POLYPECTOMY  06/20/10   [facility], [facility]   Social History:  reports that she has never smoked. She has never used smokeless tobacco. She reports that she does not drink alcohol and does not use drugs.   Family History  Problem Relation Age of Onset  . Anesthesia problems Neg Hx      No Known Allergies   Prior to Admission medications   Medication Sig Start Date End Date Taking? Authorizing Provider  amLODipine (NORVASC) 5 MG tablet Take 5 mg by mouth daily.      [provider]  losartan (COZAAR) 50 MG tablet Take 50 mg by mouth daily.      [provider]  metFORMIN (GLUCOPHAGE) 1000 MG tablet Take 1,000 mg by mouth 2 (two) times daily with a meal.  [provider]  simvastatin (ZOCOR) 40 MG tablet Take 40 mg by mouth daily.      [provider]    Review of Systems:  Constitutional:  No weight loss, night sweats, Fevers, chills, fatigue.  Head&Eyes: No headache.  No vision loss.  No eye pain or scotoma ENT:  No Difficulty swallowing,Tooth/dental problems,Sore throat,  No ear ache, post nasal drip,  Cardio-vascular:  No chest pain, Orthopnea, PND, swelling in lower extremities,   dizziness, palpitations  GI:  No  abdominal pain, nausea, vomiting, diarrhea, loss of appetite, hematochezia, melena, heartburn, indigestion, Resp:  No shortness of breath with exertion or at rest. No cough. No coughing up of blood .No wheezing.No chest wall deformity  Skin:  no rash or lesions.  GU:  no dysuria, change in color of urine, no urgency or frequency. No flank pain.  Musculoskeletal:  Left hip pain Psych:  No change in mood or affect. No depression or anxiety. Neurologic: No headache, no dysesthesia, no focal weakness, no vision loss. No syncope  Physical Exam: Vitals:   02/18/20 0800 02/18/20 0845 02/18/20 0930 02/18/20 1000  BP: (!) 169/72 (!) 155/74 (!) 157/79 (!) 159/72  Pulse: 91 94 88 88  Resp: 20 (!) 21 16 20   Temp:      TempSrc:      SpO2: 98% 96% 97% 96%  Weight:      Height:       General:  A&O x 3, NAD, nontoxic, pleasant/cooperative Head/Eye: No conjunctival hemorrhage, no icterus, [place]/AT, No nystagmus ENT:  No icterus,  No thrush, good dentition, no pharyngeal exudate Neck:  No masses, no lymphadenpathy, no bruits CV:  RRR, no rub, no gallop, no S3 Lung:  CTAB, good air movement, no wheeze, no rhonchi Abdomen: soft/NT, +BS, nondistended, no peritoneal signs Ext: No cyanosis, No rashes, No petechiae, No lymphangitis, No edema Neuro: CNII-XII intact, strength 4/5 in bilateral upper and lower extremities, no dysmetria  Labs on Admission:  Basic Metabolic Panel: Recent Labs  Lab 02/18/20 0833  NA 136  K 4.2  CL 102  CO2 22  GLUCOSE 372*  BUN 25*  CREATININE 0.74  CALCIUM 9.3   Liver Function Tests: Recent Labs  Lab 02/18/20 0833  AST 23  ALT 18  ALKPHOS 72  BILITOT 0.9  PROT 7.6  ALBUMIN 3.9   No results for input(s): LIPASE, AMYLASE in the last 168 hours. No results for input(s): AMMONIA in the last 168 hours. CBC: Recent Labs  Lab 02/18/20 0833  WBC 11.0*  NEUTROABS 9.1*  HGB 11.8*  HCT 34.4*  MCV 96.4  PLT 246    Coagulation Profile: No results for input(s): INR, PROTIME in the last 168 hours. Cardiac Enzymes: Recent Labs  Lab 02/18/20 0833  CKTOTAL 567*   BNP: Invalid input(s): POCBNP CBG: No results for input(s): GLUCAP in the last 168 hours. Urine analysis: No results found for: COLORURINE, APPEARANCEUR, LABSPEC, PHURINE, GLUCOSEU, HGBUR, BILIRUBINUR, KETONESUR, PROTEINUR, UROBILINOGEN, NITRITE, LEUKOCYTESUR Sepsis Labs: @LABRCNTIP (procalcitonin:4,lacticidven:4) ) Recent Results (from the past 240 hour(s))  Resp Panel by RT-PCR (Flu A&B, Covid) Nasopharyngeal Swab     Status: None   Collection Time: 02/18/20  8:46 AM   Specimen: Nasopharyngeal Swab; Nasopharyngeal(NP) swabs in vial transport medium  Result Value Ref Range Status   SARS Coronavirus 2 by RT PCR NEGATIVE NEGATIVE Final    Comment: (NOTE) SARS-CoV-2 target nucleic acids are NOT DETECTED.  The SARS-CoV-2 RNA is generally detectable in upper respiratory specimens during the  acute phase of infection. The lowest concentration of SARS-CoV-2 viral copies this assay can detect is 138 copies/mL. A negative result does not preclude SARS-Cov-2 infection and should not be used as the sole basis for treatment or other patient management decisions. A negative result may occur with  improper specimen collection/handling, submission of specimen other than nasopharyngeal swab, presence of viral mutation(s) within the areas targeted by this assay, and inadequate number of viral copies(<138 copies/mL). A negative result must be combined with clinical observations, patient history, and epidemiological information. The expected result is Negative.  Fact Sheet for Patients:  BloggerCourse.com  Fact Sheet for Healthcare Providers:  SeriousBroker.it  This test is no t yet approved or cleared by the [place] FDA and  has been authorized for detection and/or diagnosis of SARS-CoV-2  by FDA under an Emergency Use Authorization (EUA). This EUA will remain  in effect (meaning this test can be used) for the duration of the COVID-19 declaration under Section 564(b)(1) of the Act, 21 U.S.C.section 360bbb-3(b)(1), unless the authorization is terminated  or revoked sooner.       Influenza A by PCR NEGATIVE NEGATIVE Final   Influenza B by PCR NEGATIVE NEGATIVE Final    Comment: (NOTE) The Xpert Xpress SARS-CoV-2/FLU/RSV plus assay is intended as an aid in the diagnosis of influenza from Nasopharyngeal swab specimens and should not be used as a sole basis for treatment. Nasal washings and aspirates are unacceptable for Xpert Xpress SARS-CoV-2/FLU/RSV testing.  Fact Sheet for Patients: BloggerCourse.com  Fact Sheet for Healthcare Providers: SeriousBroker.it  This test is not yet approved or cleared by the [place] FDA and has been authorized for detection and/or diagnosis of SARS-CoV-2 by FDA under an Emergency Use Authorization (EUA). This EUA will remain in effect (meaning this test can be used) for the duration of the COVID-19 declaration under Section 564(b)(1) of the Act, 21 U.S.C. section 360bbb-3(b)(1), unless the authorization is terminated or revoked.  Performed at [facility], [address], [place], [place] [address]      Radiological Exams on Admission: DG Chest [facility] 1 View  Result Date: 02/18/2020 CLINICAL DATA:  85 year old female status post fall last night, found down by neighbor this morning. Left hip fracture. EXAM: PORTABLE CHEST 1 VIEW COMPARISON:  None. FINDINGS: Portable AP semi upright view at 0810 hours. Somewhat low lung volumes. Mediastinal contours within normal limits. Visualized tracheal air column is within normal limits. Allowing for portable technique the lungs are clear. Negative visible bowel gas pattern. No acute osseous abnormality identified. IMPRESSION: No acute  cardiopulmonary abnormality. Electronically Signed   By: Odessa Fleming M.D.   On: 02/18/2020 08:22   DG Hip Unilat With Pelvis 2-3 Views Left  Result Date: 02/18/2020 CLINICAL DATA:  85 year old female status post fall last night, found down by neighbor this morning. Left hip pain. EXAM: DG HIP (WITH OR WITHOUT PELVIS) 2-3V LEFT COMPARISON:  None. FINDINGS: Portable views of the pelvis and left hip. Impacted fracture of the left femoral neck. The intertrochanteric segment appears to remain intact. Left femoral head normally located. No superimposed pelvis fracture. Grossly intact proximal right femur. Bone mineralization is within normal limits for age. Negative visible bowel gas pattern. IMPRESSION: Acute impacted fracture of the left femoral neck. Electronically Signed   By: Odessa Fleming M.D.   On: 02/18/2020 08:21    EKG: Independently reviewed. Sinus, no STT changes    Time spent:60 minutes Code Status:   FULL Family Communication:  No Family at  bedside Disposition Plan: expect 2-3 day hospitalization Consults called: ortho DVT Prophylaxis: [place] Lovenox  Catarina Hartshorn, DO  [facility] Pager [phone]  If 7PM-7AM, please contact night-coverage [email or web address] Password [email or web address] 02/18/2020, 11:12 AM

## 2020-02-18 NOTE — Transfer of Care (Signed)
Immediate Anesthesia Transfer of Care Note  Patient: HALEY FUERSTENBERG  Procedure(s) Performed: ARTHROPLASTY BIPOLAR HIP (HEMIARTHROPLASTY) (Left Hip)  Patient Location: PACU  Anesthesia Type:Spinal  Level of Consciousness: awake, alert  and oriented  Airway & Oxygen Therapy: Patient Spontanous Breathing  Post-op Assessment: Report given to RN  Post vital signs: Reviewed and stable  Last Vitals:  Vitals Value Taken Time  BP 144/70 02/18/20 1654  Temp    Pulse 119 02/18/20 1655  Resp 34 02/18/20 1655  SpO2 94 % 02/18/20 1655  Vitals shown include unvalidated device data.  Last Pain:  Vitals:   02/18/20 1133  TempSrc: Oral  PainSc: 0-No pain      Patients Stated Pain Goal: 5 (02/18/20 1133)  Complications: No complications documented.

## 2020-02-18 NOTE — Op Note (Signed)
Orthopaedic Surgery Operative Note (CSN: [id number])  Jenna Long  1934/08/12 Date of Surgery: 02/18/2020   Diagnoses:  Left femoral neck fracture  Procedure: Cemented left hip hemiarthroplasty   Operative Finding Successful completion of the planned procedure.    Marland Kitchen  Post-Op Diagnosis: Same Surgeons:Primary: Mordecai Rasmussen, MD Assistants: Marquita Palms Location: [facility] OR ROOM 4 Anesthesia: Sedation plus regional anesthesia Antibiotics: Ancef 2 g Tourniquet time: * No tourniquets in log * Estimated Blood Loss: [id number] cc Complications: None Specimens: None Implants: Implant Name Type Inv. Item Serial No. Manufacturer Lot No. LRB No. Used Action  CEMENT HV SMART SET - [id number] Cement CEMENT HV SMART SET  DEPUY SYNTHES [id number] Left 2 Implanted  BIPOLAR PROS AML 46MM - [id number] Hips BIPOLAR PROS AML 46MM  DEPUY ORTHOPAED[employer] [id number] Left 1 Implanted  CEMENT RESTRICTOR DEPUY SZ 1 - [id number] Cement CEMENT RESTRICTOR DEPUY SZ 1  DEPUY ORTHOPAED[employer] [id number] Left 1 Implanted  HIP BALL ARTICU 28 +5 - [id number] Hips HIP BALL ARTICU 28 +5  DEPUY ORTHOPAEDICS [id number] Left 1 Implanted  SUMMIT CEMENT BASIC SZ3 - [id number] Hips SUMMIT CEMENT BASIC SZ3  DEPUY ORTHOPAEDICS [id number] Left 1 Implanted  CEMENTRALIZER STEM CENTRALIZER Stem   DEPUY ORTHOPAEDICS [id number] Left 1 Implanted    Indications for Surgery:   Jenna Long is a 85 y.o. female who had a mechanical fall and sustained a displaced femoral neck fracture.  Benefits and risks of operative and nonoperative management were discussed prior to surgery with patient/guardian(s) and informed consent form was completed.  Specific risks including infection, need for additional surgery, dislocation, damage to surrounding structures, bleeding and more severe complications associated with anesthesia.    Procedure:   The patient was identified properly. Informed consent was obtained and the surgical site was marked. The patient was taken up to suite  where general anesthesia was induced.  The patient was positioned lateral on a regular OR table .  The left leg was prepped and draped in the usual sterile fashion.  Timeout was performed before the beginning of the case.  We confirmed that she received Ancef and TXA prior to incision.   We made an incision centered over the greater trochanter with a scalpel. We used the scalpel to continue to dissect to the fascia. The fascia was pierced with Bovie electrocautery. Mayo scissors were used to cut the fascia in a longitudinal fashion. The gluteus maximus fibers were bluntly split in line with their fibers.   The femur was slowly internally rotated, putting tension on the posterior structures. Bovie electrocautery was used to dissect the short external rotators off of the insertion onto the femur. After the short external rotators were transected, we visualized the femoral neck fracture. We identified the sciatic nerve by palpation and verified that it was not in danger from dissection.   We made a T-shaped capsulotomy. We made a neck cut approximately 2 cm above the lesser trochanter with a a reciprocating saw. We removed the femoral head. We used the box cutter to cut away some of the greater trochanter for ease of insertion of the stem. We then used the canal finder to locate the femoral canal.   Using the angle of the femoral neck as our guide for version, we broached sequentially. We trialed components and found appropriate fit and stability.  The patient would come to full extension of the hip. The hip was stable at 90 degrees of flexion and 45 degrees of internal rotation.  We then removed  the trials as well as the broach. We ensured there were no foreign bodies or bone within the acetabulum. We copiously irrigated the wound.  A cement restrictor was placed within the canal.  Cement was prepared on the back table and then inserted into the canal.  Excess cement was removed.  We then carefully placed our  stem, size listed above before assembling the head and neck together onto the stem. We then reduced the hip. Again, that was stable in the previously mentioned manipulations.   We repaired the posterior capsule taking care to both repair the T split. Short external rotators repaired to the greater trochanter to bone tunnels. We closed the fascia of the iliotibial band and gluteus maximus with running and intterupted Vicryl sutures. We then closed Scarpa's fascia with Vicryl sutures. Skin was closed with 2-0 Monocryl and staples followed by an Aquacel dressing.  She was placed in a hip abduction pillow prior to flipping her on to her back.  Patient was awoken and taken to PACU in stable condition.  Post-operative plan:  The patient will be admitted to the hospital on the medicine service She will be WBAT LLE, posterior hip precautions.   Hip abduction pillow when not working with PT/OT   DVT prophylaxis per primary team, no orthopedic contraindications.  Prefer to wait until 24 hours postop to start.    Pain control with PRN pain medication preferring oral medicines.   Follow up plan will be scheduled in approximately 10 days for incision check and XR

## 2020-02-19 DIAGNOSIS — S72002G Fracture of unspecified part of neck of left femur, subsequent encounter for closed fracture with delayed healing: Secondary | ICD-10-CM

## 2020-02-19 LAB — GLUCOSE, CAPILLARY
Glucose-Capillary: 253 mg/dL — ABNORMAL HIGH (ref 70–99)
Glucose-Capillary: 255 mg/dL — ABNORMAL HIGH (ref 70–99)
Glucose-Capillary: 168 mg/dL — ABNORMAL HIGH (ref 70–99)
Glucose-Capillary: 203 mg/dL — ABNORMAL HIGH (ref 70–99)

## 2020-02-19 LAB — URINALYSIS, COMPLETE (UACMP) WITH MICROSCOPIC
Bacteria, UA: NONE SEEN
Bilirubin Urine: NEGATIVE
Glucose, UA: >=500 mg/dL — AB
Hgb urine dipstick: NEGATIVE
Ketones, ur: NEGATIVE mg/dL
Nitrite: NEGATIVE
Protein, ur: 30 mg/dL — AB
Specific Gravity, Urine: 1.02 (ref 1.005–1.030)
pH: 5 (ref 5.0–8.0)

## 2020-02-19 LAB — BASIC METABOLIC PANEL
Anion gap: 7 (ref 5–15)
BUN: 18 mg/dL (ref 8–23)
CO2: 23 mmol/L (ref 22–32)
Calcium: 8.3 mg/dL — ABNORMAL LOW (ref 8.9–10.3)
Chloride: 103 mmol/L (ref 98–111)
Creatinine, Ser: 0.74 mg/dL (ref 0.44–1.00)
GFR, Estimated: >60 mL/min (ref >60)
Glucose, Bld: 267 mg/dL — ABNORMAL HIGH (ref 70–99)
Potassium: 3.5 mmol/L (ref 3.5–5.1)
Sodium: 133 mmol/L — ABNORMAL LOW (ref 135–145)

## 2020-02-19 LAB — CBC
HCT: 27.4 % — ABNORMAL LOW (ref 36.0–46.0)
Hemoglobin: 9.2 g/dL — ABNORMAL LOW (ref 12.0–15.0)
MCH: 33.3 pg (ref 26.0–34.0)
MCHC: 33.6 g/dL (ref 30.0–36.0)
MCV: 99.3 fL (ref 80.0–100.0)
Platelets: 185 10*3/uL (ref 150–400)
RBC: 2.76 MIL/uL — ABNORMAL LOW (ref 3.87–5.11)
RDW: 13.2 % (ref 11.5–15.5)
WBC: 9.1 10*3/uL (ref 4.0–10.5)
nRBC: 0 % (ref 0.0–0.2)

## 2020-02-19 MED ORDER — JUVEN PO PACK
1.0000 | PACK | Freq: Two times a day (BID) | ORAL | Status: DC
  Administered 2020-02-19 – 2020-02-22 (×7): 1 via ORAL
  Filled 2020-02-19 (×7): qty 1

## 2020-02-19 MED ORDER — ATORVASTATIN CALCIUM 20 MG PO TABS
20.0000 mg | ORAL_TABLET | Freq: Every day | ORAL | Status: DC
Start: 2020-02-19 — End: 2020-02-22
  Administered 2020-02-19 – 2020-02-21 (×3): 20 mg via ORAL
  Filled 2020-02-19 (×3): qty 1

## 2020-02-19 MED ORDER — ENSURE MAX PROTEIN PO LIQD
11.0000 [oz_av] | Freq: Every day | ORAL | Status: DC
  Administered 2020-02-19 – 2020-02-20 (×2): 11 [oz_av] via ORAL

## 2020-02-19 MED ORDER — ACETAMINOPHEN 325 MG PO TABS
650.0000 mg | ORAL_TABLET | Freq: Four times a day (QID) | ORAL | Status: DC | PRN
  Administered 2020-02-19: 650 mg via ORAL
  Filled 2020-02-19: qty 2

## 2020-02-19 NOTE — Evaluation (Signed)
Physical Therapy Evaluation Patient Details Name: Jenna Long MRN: [id number] DOB: 05-05-1934 Today's Date: 02/19/2020   History of Present Illness  Jenna Long is a 85 y.o. female with medical history of diabetes mellitus type 2, hypertension, hyperlipidemia presenting after mechanical fall.  The patient was outside checking on her kerosene oil.  The patient sustained a mechanical fall while stepping off a cinderblock and fell onto her left side.  She tried to stand up but was unable to do that nor bear weight onto her left leg.  She subsequently crawled back to her front porch where she stayed through the night.  The patient denied any loss of consciousness.  She denied any prodromal symptoms.  On the morning of 02/18/2020, the patient was able to get the attention of her neighbor who subsequently contacted EMS to bring the patient to the hospital for further evaluation.     Clinical Impression  Patient limited for functional mobility as stated below secondary to BLE weakness, fatigue, pain, and poor standing balance. Completed evaluation today with OT present and assisting. Patient educated on posterior hip precautions at beginning of session. Patient requires max assist to transition to seated EOB secondary to pain and weakness. She initially requires support for sitting balance due to c/o increased hip pain. Patient demonstrates good sitting tolerance EOB. Transfer to standing with max assist with extensive cueing for sequencing and RW use with limited carry over. Patient with very limited standing ability requiring max assist to remain standing despite cueing to utilize RLE and UE on RW. Patient becomes increasingly fatigued with every attempt to transfer to standing. Patient assisted back into bed at end of session as she was unable to ambulate or transfer to chair today.  Patient will benefit from continued physical therapy in hospital and recommended venue below to increase strength,  balance, endurance for safe ADLs and gait.     Follow Up Recommendations SNF    Equipment Recommendations  None recommended by PT    Recommendations for Other Services       Precautions / Restrictions Precautions Precautions: Fall;Posterior Hip Precaution Comments: abductor pillow on when not working with therapy. Required Braces or Orthoses:  (adductor foam) Restrictions Weight Bearing Restrictions: Yes LLE Weight Bearing: Weight bearing as tolerated      Mobility  Bed Mobility Overal bed mobility: Needs Assistance Bed Mobility: Supine to Sit;Sit to Supine     Supine to sit: Max assist;+2 for physical assistance Sit to supine: Max assist;+2 for physical assistance   General bed mobility comments: slow, labored transition to seated EOB requiring assist secondary to pain    Transfers Overall transfer level: Needs assistance Equipment used: Rolling walker (2 wheeled) Transfers: Sit to/from Stand Sit to Stand: Max assist;Total assist;From elevated surface         General transfer comment: transfer to standing requires extensive cueing for sequencing/RW use with limited carry over, max assist secondary to pain/weakness, unable to remain standing without assist, transfer to standing x3 with increasing fatigue and decreased functional ability with each attempt  Ambulation/Gait                Stairs            Wheelchair Mobility    Modified Rankin (Stroke Patients Only)       Balance Overall balance assessment: Needs assistance Sitting-balance support: Bilateral upper extremity supported;Feet supported Sitting balance-Leahy Scale: Good Sitting balance - Comments: requires assist to remain seated initially due to pain, able  to maintain sitting balance without assist after several minutes   Standing balance support: Bilateral upper extremity supported Standing balance-Leahy Scale: Zero Standing balance comment: with RW and assist                              Pertinent Vitals/Pain Pain Assessment: Faces Faces Pain Scale: Hurts whole lot Pain Location: L hip with movement Pain Descriptors / Indicators: Sore;Sharp Pain Intervention(s): Limited activity within patient's tolerance;Monitored during session;Ice applied;Repositioned    Home Living Family/patient expects to be discharged to:: Private residence Living Arrangements: Alone   Type of Home: House Home Access: Level entry     Home Layout: One level Home Equipment: Environmental consultant - 2 wheels;Bedside commode;Cane - single point;Shower seat;Wheelchair - manual;Grab bars - tub/shower      Prior Function Level of Independence: Independent with assistive device(s)         Comments: Patient states independent with ADL and community ambulator with [facility]     Hand Dominance   Dominant Hand: Right    Extremity/Trunk Assessment   Upper Extremity Assessment Upper Extremity Assessment: Generalized weakness    Lower Extremity Assessment Lower Extremity Assessment: Defer to PT evaluation LLE Deficits / Details: 3-/5 L knee extension LLE: Unable to fully assess due to pain    Cervical / Trunk Assessment Cervical / Trunk Assessment: Normal  Communication   Communication: No difficulties  Cognition Arousal/Alertness: Awake/alert Behavior During Therapy: WFL for tasks assessed/performed Overall Cognitive Status: Within Functional Limits for tasks assessed                                        General Comments      Exercises     Assessment/Plan    PT Assessment Patient needs continued PT services  PT Problem List Decreased strength;Decreased range of motion;Decreased activity tolerance;Decreased balance;Decreased mobility;Decreased knowledge of precautions;Pain       PT Treatment Interventions DME instruction;Balance training;Gait training;Neuromuscular re-education;Stair training;Functional mobility training;Patient/family  education;Therapeutic activities;Wheelchair mobility training;Therapeutic exercise;Manual techniques    PT Goals (Current goals can be found in the Care Plan section)  Acute Rehab PT Goals Patient Stated Goal: to get stronger PT Goal Formulation: With patient Time For Goal Achievement: 03/04/20 Potential to Achieve Goals: Good    Frequency Min 4X/week   Barriers to discharge        Co-evaluation PT/OT/SLP Co-Evaluation/Treatment: Yes Reason for Co-Treatment: To address functional/ADL transfers;For patient/therapist safety PT goals addressed during session: Mobility/safety with mobility;Balance;Proper use of DME;Strengthening/ROM OT goals addressed during session: ADL's and self-care;Strengthening/ROM;Proper use of Adaptive equipment and DME       AM-PAC PT "6 Clicks" Mobility  Outcome Measure Help needed turning from your back to your side while in a flat bed without using bedrails?: A Little Help needed moving from lying on your back to sitting on the side of a flat bed without using bedrails?: A Lot Help needed moving to and from a bed to a chair (including a wheelchair)?: Total Help needed standing up from a chair using your arms (e.g., wheelchair or bedside chair)?: Total Help needed to walk in hospital room?: Total Help needed climbing 3-5 steps with a railing? : Total 6 Click Score: 9    End of Session Equipment Utilized During Treatment: Gait belt Activity Tolerance: Patient limited by pain Patient left: in bed;with call bell/phone within reach;with  bed alarm set Nurse Communication: Mobility status PT Visit Diagnosis: Unsteadiness on feet (R26.81);Other abnormalities of gait and mobility (R26.89);Muscle weakness (generalized) (M62.81)    Time: [id number] PT Time Calculation (min) (ACUTE ONLY): 30 min   Charges:   PT Evaluation $PT Eval Low Complexity: 1 Low PT Treatments $Therapeutic Activity: 8-22 mins         10:40 AM, 02/19/20 Mearl Latin PT,  DPT Physical Therapist at [place]

## 2020-02-19 NOTE — Progress Notes (Signed)
PROGRESS NOTE  Jenna Long [id number] DOB: April 21, 1934 DOA: 02/18/2020 PCP: Sharilyn Sites, MD  Brief History:  85 y.o. female with medical history of diabetes mellitus type 2, hypertension, hyperlipidemia presenting after mechanical fall.  The patient was outside checking on her kerosene oil.  The patient sustained a mechanical fall while stepping off a cinderblock and fell onto her left side.  She tried to stand up but was unable to do that nor bear weight onto her left leg.  She subsequently crawled back to her front porch where she stayed through the night.  The patient denied any loss of consciousness.  She denied any prodromal symptoms.  On the morning of 02/18/2020, the patient was able to get the attention of her neighbor who subsequently contacted EMS to bring the patient to the hospital for further evaluation. In the emergency department, the patient was afebrile and hemodynamically stable with oxygen saturation 97% room air.  BMP and LFTs were unremarkable.  WBC 11.0, hemoglobin 11.8, platelets 246,000.  Chest x-ray was negative.  EKG was sinus rhythm with no ST-T wave changes.  X-ray of the left hip showed an acute impacted left femoral neck fracture.  Orthopedics was consulted.  She underwent left hip hemiarthroplasty on 02/18/20   Assessment/Plan: Acute left femoral neck fracture -Orthopedics consulted - left hip hemiarthroplasty on 02/18/20 -Post op care per ortho -continue DVT prophylaxis -PT/OT-->SNF -judicious opioids  Essential hypertension -Restart amlodipine and losartan -BP elevated at this time as the patient has not had her antihypertensive medications  Post op fever -had temp 101.7 on 12/31 -UA and urine culture -blood culture x 2 -12/30 CXR--no infiltrates  Diabetes mellitus type 2 -Holding Metformin -Check hemoglobin A1c--7.6 -NovoLog sliding scale  Hyperlipidemia -Continue statin     Status is: Inpatient  Remains inpatient  appropriate because:IV treatments appropriate due to intensity of illness or inability to take PO   Dispo: The patient is from: Home              Anticipated d/c is to: SNF              Anticipated d/c date is: 2 days              Patient currently is not medically stable to d/c.        Family Communication:   No Family at bedside  Consultants:  ortho  Code Status:  FULL   DVT Prophylaxis:  [place] Lovenox   Procedures: As Listed in Progress Note Above  Antibiotics: Perioperative cefazolin       Subjective: Patient complains of left hip pain.  Denies f/c, cp, cough, n/v/d, abd pain, dysuria  Objective: Vitals:   02/18/20 2237 02/19/20 0147 02/19/20 0635 02/19/20 1429  BP: (!) 162/68 (!) 166/70 (!) 144/62 (!) 151/77  Pulse: 99 93 87 100  Resp: 18 18 18 18   Temp: 99.3 F (37.4 C) 97.8 F (36.6 C) 98.1 F (36.7 C) (!) 101.7 F (38.7 C)  TempSrc:  Oral Oral Oral  SpO2: 96% 95% 95% 95%  Weight:      Height:        Intake/Output Summary (Last 24 hours) at 02/19/2020 1709 Last data filed at 02/19/2020 0400 Gross per 24 hour  Intake 2288.32 ml  Output 550 ml  Net 1738.32 ml   Weight change:  Exam:   General:  Pt is alert, follows commands appropriately, not in acute distress  HEENT: No icterus,  No thrush, No neck mass, [place]/AT  Cardiovascular: RRR, S1/S2, no rubs, no gallops  Respiratory: fine bibasilar rales. No wheeze  Abdomen: Soft/+BS, non tender, non distended, no guarding  Extremities: No edema, No lymphangitis, No petechiae, No rashes, no synovitis   Data Reviewed: I have personally reviewed following labs and imaging studies Basic Metabolic Panel: Recent Labs  Lab 02/18/20 0833 02/19/20 0525  NA 136 133*  K 4.2 3.5  CL 102 103  CO2 22 23  GLUCOSE 372* 267*  BUN 25* 18  CREATININE 0.74 0.74  CALCIUM 9.3 8.3*   Liver Function Tests: Recent Labs  Lab 02/18/20 0833  AST 23  ALT 18  ALKPHOS 72  BILITOT 0.9  PROT 7.6  ALBUMIN  3.9   No results for input(s): LIPASE, AMYLASE in the last 168 hours. No results for input(s): AMMONIA in the last 168 hours. Coagulation Profile: No results for input(s): INR, PROTIME in the last 168 hours. CBC: Recent Labs  Lab 02/18/20 0833 02/19/20 0525  WBC 11.0* 9.1  NEUTROABS 9.1*  --   HGB 11.8* 9.2*  HCT 34.4* 27.4*  MCV 96.4 99.3  PLT 246 185   Cardiac Enzymes: Recent Labs  Lab 02/18/20 0833  CKTOTAL 567*   BNP: Invalid input(s): POCBNP CBG: Recent Labs  Lab 02/18/20 1656 02/18/20 2114 02/19/20 0748 02/19/20 1110 02/19/20 1644  GLUCAP 275* 235* 253* 255* 168*   HbA1C: Recent Labs    02/18/20 0833  HGBA1C 7.6*   Urine analysis: No results found for: COLORURINE, APPEARANCEUR, LABSPEC, PHURINE, GLUCOSEU, HGBUR, BILIRUBINUR, KETONESUR, PROTEINUR, UROBILINOGEN, NITRITE, LEUKOCYTESUR Sepsis Labs: @LABRCNTIP (procalcitonin:4,lacticidven:4) ) Recent Results (from the past 240 hour(s))  Resp Panel by RT-PCR (Flu A&B, Covid) Nasopharyngeal Swab     Status: None   Collection Time: 02/18/20  8:46 AM   Specimen: Nasopharyngeal Swab; Nasopharyngeal(NP) swabs in vial transport medium  Result Value Ref Range Status   SARS Coronavirus 2 by RT PCR NEGATIVE NEGATIVE Final    Comment: (NOTE) SARS-CoV-2 target nucleic acids are NOT DETECTED.  The SARS-CoV-2 RNA is generally detectable in upper respiratory specimens during the acute phase of infection. The lowest concentration of SARS-CoV-2 viral copies this assay can detect is 138 copies/mL. A negative result does not preclude SARS-Cov-2 infection and should not be used as the sole basis for treatment or other patient management decisions. A negative result may occur with  improper specimen collection/handling, submission of specimen other than nasopharyngeal swab, presence of viral mutation(s) within the areas targeted by this assay, and inadequate number of viral copies(<138 copies/mL). A negative result must be  combined with clinical observations, patient history, and epidemiological information. The expected result is Negative.  Fact Sheet for Patients:  EntrepreneurPulse.com.au  Fact Sheet for Healthcare Providers:  IncredibleEmployment.be  This test is no t yet approved or cleared by the [place] FDA and  has been authorized for detection and/or diagnosis of SARS-CoV-2 by FDA under an Emergency Use Authorization (EUA). This EUA will remain  in effect (meaning this test can be used) for the duration of the COVID-19 declaration under Section 564(b)(1) of the Act, 21 U.S.C.section 360bbb-3(b)(1), unless the authorization is terminated  or revoked sooner.       Influenza A by PCR NEGATIVE NEGATIVE Final   Influenza B by PCR NEGATIVE NEGATIVE Final    Comment: (NOTE) The Xpert Xpress SARS-CoV-2/FLU/RSV plus assay is intended as an aid in the diagnosis of influenza from Nasopharyngeal swab specimens and should not be used as a sole  basis for treatment. Nasal washings and aspirates are unacceptable for Xpert Xpress SARS-CoV-2/FLU/RSV testing.  Fact Sheet for Patients: BloggerCourse.com  Fact Sheet for Healthcare Providers: SeriousBroker.it  This test is not yet approved or cleared by the [place] FDA and has been authorized for detection and/or diagnosis of SARS-CoV-2 by FDA under an Emergency Use Authorization (EUA). This EUA will remain in effect (meaning this test can be used) for the duration of the COVID-19 declaration under Section 564(b)(1) of the Act, 21 U.S.C. section 360bbb-3(b)(1), unless the authorization is terminated or revoked.  Performed at [facility], [address], [place], [place] [address]      Scheduled Meds: . amLODipine  5 mg Oral Daily  . atorvastatin  20 mg Oral QHS  . Chlorhexidine Gluconate Cloth  6 each Topical Daily  . enoxaparin (LOVENOX) injection  40  mg Subcutaneous Q24H  . insulin aspart  0-5 Units Subcutaneous QHS  . insulin aspart  0-9 Units Subcutaneous TID WC  . losartan  50 mg Oral Daily  . nutrition supplement (JUVEN)  1 packet Oral BID BM  . Ensure Max Protein  11 oz Oral Daily   Continuous Infusions: . sodium chloride 125 mL/hr at 02/18/20 1843  .  ceFAZolin (ANCEF) IV 2 g (02/19/20 1646)    Procedures/Studies: DG Chest Port 1 View  Result Date: 02/18/2020 CLINICAL DATA:  85 year old female status post fall last night, found down by neighbor this morning. Left hip fracture. EXAM: PORTABLE CHEST 1 VIEW COMPARISON:  None. FINDINGS: Portable AP semi upright view at 0810 hours. Somewhat low lung volumes. Mediastinal contours within normal limits. Visualized tracheal air column is within normal limits. Allowing for portable technique the lungs are clear. Negative visible bowel gas pattern. No acute osseous abnormality identified. IMPRESSION: No acute cardiopulmonary abnormality. Electronically Signed   By: Odessa Fleming M.D.   On: 02/18/2020 08:22   DG HIP UNILAT WITH PELVIS 2-3 VIEWS LEFT  Result Date: 02/18/2020 CLINICAL DATA:  Left hip fracture status post arthroplasty EXAM: DG HIP (WITH OR WITHOUT PELVIS) 2-3V LEFT COMPARISON:  02/18/2020 FINDINGS: Frontal view of the pelvis as well as frontal and frogleg lateral views of the left hip demonstrate left hip hemiarthroplasty in the expected position without signs of acute complication. Postsurgical changes are seen within the overlying soft tissues. The remainder of the bony pelvis is unremarkable. IMPRESSION: 1. Unremarkable left hip hemiarthroplasty. Electronically Signed   By: Sharlet Salina M.D.   On: 02/18/2020 18:03   DG Hip Unilat With Pelvis 2-3 Views Left  Result Date: 02/18/2020 CLINICAL DATA:  85 year old female status post fall last night, found down by neighbor this morning. Left hip pain. EXAM: DG HIP (WITH OR WITHOUT PELVIS) 2-3V LEFT COMPARISON:  None. FINDINGS: Portable  views of the pelvis and left hip. Impacted fracture of the left femoral neck. The intertrochanteric segment appears to remain intact. Left femoral head normally located. No superimposed pelvis fracture. Grossly intact proximal right femur. Bone mineralization is within normal limits for age. Negative visible bowel gas pattern. IMPRESSION: Acute impacted fracture of the left femoral neck. Electronically Signed   By: Odessa Fleming M.D.   On: 02/18/2020 08:21    Catarina Hartshorn, DO  [facility]  If 7PM-7AM, please contact night-coverage [email or web address] Password [email or web address] 02/19/2020, 5:09 PM   LOS: 1 day

## 2020-02-19 NOTE — Progress Notes (Signed)
Initial Nutrition Assessment  DOCUMENTATION CODES:   Not applicable  INTERVENTION:  Ensure Max po daily, each supplement provides 150 kcal and 30 grams of protein  1 packet Juven BID, each packet provides 95 calories, 2.5 grams of protein (collagen), and 9.8 grams of carbohydrate (3 grams sugar); also contains 7 grams of L-arginine and L-glutamine, 300 mg vitamin C, 15 mg vitamin E, 1.2 mcg vitamin B-12, 9.5 mg zinc, 200 mg calcium, and 1.5 g  Calcium Beta-hydroxy-Beta-methylbutyrate to support wound healing  Advance diet as medically feasible  NUTRITION DIAGNOSIS:   Increased nutrient needs related to post-op healing (left hip fx s/p cemented hemiathroplasty) as evidenced by estimated needs.   GOAL:   Patient will meet greater than or equal to 90% of their needs    MONITOR:   PO intake,Weight trends,Diet advancement,Labs,Supplement acceptance,I & O's,Skin  REASON FOR ASSESSMENT:   Consult Assessment of nutrition requirement/status (hip fx)  ASSESSMENT:  85 year old female admitted for acute left femoral neck fracture after sustaining mechanical fall while stepping off a cinderblock at home. Past medical history significant of DM2, HTN and HLD.  Pt is s/p cemented left hip hemiarthroplasty on 12/30  Patient resting quietly in bed, reports feeling nauseas. She reports dizziness associated with nausea after sitting upright for the first time s/p procedure during PT evaluation. Patient reports drinking diet ginger ale, but no po intake of breakfast, says she spilled her oatmeal in the bed this morning. She endorses good appetite at baseline, eats "whatever she feels like." Usually eats a good breakfast, likes to snack throughout the day, tries not to eat anything after supper to avoid high blood sugar in the morning. Patient denies any recent changes to weight, usually weighs 150-155 lbs. RD discussed the importance of adequate protein to promote post-op healing and encouraged po  intake. Patient agreeable to drinking Ensure Max (vanilla) to help her meet her needs as well as Juven to support post-op wound healing.   Current wt 68.5 kg (150.7 lbs)   Medications reviewed and include: SSI, Ancef  Labs: CBGs 253,235,275, Na 133 (L) A1c 7.6 (H) - 02/18/20  NUTRITION - FOCUSED PHYSICAL EXAM: Findings: Mild orbital fat depletion; Mild temple; dorsal hand muscle depletion   Diet Order:   Diet Order            Diet full liquid Room service appropriate? Yes; Fluid consistency: Thin  Diet effective now                 EDUCATION NEEDS:   Education needs have been addressed  Skin:  Skin Assessment: Skin Integrity Issues: Skin Integrity Issues:: Incisions Incisions: closed; left hip  Last BM:  12/29  Height:   Ht Readings from Last 1 Encounters:  02/18/20 5\' 7"  (1.702 m)    Weight:   Wt Readings from Last 1 Encounters:  02/18/20 68.5 kg    BMI:  Body mass index is 23.65 kg/m.  Estimated Nutritional Needs:   Kcal:  2000-2200  Protein:  96-105  Fluid:  >1.7 L   02/20/20, RD, LDN Clinical Nutrition After Hours/Weekend Pager # in Amion

## 2020-02-19 NOTE — Evaluation (Signed)
Occupational Therapy Evaluation Patient Details Name: Jenna Long MRN: [id number] DOB: 1934/03/18 Today's Date: 02/19/2020    History of Present Illness Jenna Long is a 85 y.o. female with medical history of diabetes mellitus type 2, hypertension, hyperlipidemia presenting after mechanical fall.  The patient was outside checking on her kerosene oil.  The patient sustained a mechanical fall while stepping off a cinderblock and fell onto her left side.  She tried to stand up but was unable to do that nor bear weight onto her left leg.  She subsequently crawled back to her front porch where she stayed through the night.  The patient denied any loss of consciousness.  She denied any prodromal symptoms.  On the morning of 02/18/2020, the patient was able to get the attention of her neighbor who subsequently contacted EMS to bring the patient to the hospital for further evaluation.   Clinical Impression   Pt in bed upon therapy arrival and agree to participate in OT and PT evaluation. Patient reported no pain at rest although increased pain in LLE felt with any movement or bed mobility. Patient was unable to stand completely with PT with use of RW; 3 attempts were made with patient given max VC and visual cues for technique and sequencing. Patient demonstrates decreased strength and endurance with increased post op pain in the left hip including hip precautions while requiring increased physical assistance to complete functional transfers/mobility and basic ADL tasks. Patient will benefit from discharge to SNF for skilled OT services in order to increase functional performance during ADL tasks. OT will follow patient acutely prior to discharge.      Follow Up Recommendations  SNF    Equipment Recommendations  None recommended by OT       Precautions / Restrictions Precautions Precautions: Fall;Posterior Hip Precaution Comments: abductor pillow on when not working with therapy. Required Braces  or Orthoses:  (adductor foam) Restrictions Weight Bearing Restrictions: Yes LLE Weight Bearing: Weight bearing as tolerated      Mobility Bed Mobility    See PT evaluation note for bed mobility                Transfers  Transfer unable to be completed by PT during evaluation. 3 attempts made to perform sit to stand with RW.                         ADL either performed or assessed with clinical judgement   ADL Overall ADL's : Needs assistance/impaired Eating/Feeding: Set up;Sitting   Grooming: Wash/dry hands;Wash/dry face;Brushing hair;Minimal assistance;Sitting   Upper Body Bathing: Minimal assistance;Sitting   Lower Body Bathing: Total assistance;Bed level;Adhering to hip precautions   Upper Body Dressing : Minimal assistance;Sitting   Lower Body Dressing: Total assistance;Adhering to hip precautions;Bed level     Toilet Transfer Details (indicate cue type and reason): Not performed   Toileting - Clothing Manipulation Details (indicate cue type and reason): Not performed             Vision Baseline Vision/History: No visual deficits Patient Visual Report: No change from baseline              Pertinent Vitals/Pain Pain Assessment: Faces Faces Pain Scale: Hurts whole lot Pain Location: L hip with movement Pain Descriptors / Indicators: Sore;Sharp Pain Intervention(s): Limited activity within patient's tolerance;Monitored during session;Ice applied;Repositioned     Hand Dominance Right   Extremity/Trunk Assessment Upper Extremity Assessment Upper Extremity Assessment: Generalized weakness  Lower Extremity Assessment Lower Extremity Assessment: Defer to PT evaluation LLE Deficits / Details: 3-/5 L knee extension LLE: Unable to fully assess due to pain   Cervical / Trunk Assessment Cervical / Trunk Assessment: Normal   Communication Communication Communication: No difficulties   Cognition Arousal/Alertness: Awake/alert Behavior  During Therapy: WFL for tasks assessed/performed Overall Cognitive Status: Within Functional Limits for tasks assessed                      Home Living Family/patient expects to be discharged to:: Private residence Living Arrangements: Alone   Type of Home: House Home Access: Level entry     Home Layout: One level     Bathroom Shower/Tub: Engineer, production Accessibility: Yes   Home Equipment: Environmental consultant - 2 wheels;Bedside commode;Cane - single point;Shower seat;Wheelchair - manual;Grab bars - tub/shower          Prior Functioning/Environment Level of Independence: Independent with assistive device(s)        Comments: Patient states independent with ADL and community ambulator with [facility]        OT Problem List: Decreased strength;Pain;Decreased activity tolerance;Impaired balance (sitting and/or standing);Decreased knowledge of precautions      OT Treatment/Interventions: Self-care/ADL training;Therapeutic exercise;Therapeutic activities;Neuromuscular education;DME and/or AE instruction;Patient/family education;Modalities;Balance training;Manual therapy    OT Goals(Current goals can be found in the care plan section) Acute Rehab OT Goals Patient Stated Goal: to get stronger OT Goal Formulation: With patient Time For Goal Achievement: 03/04/20 Potential to Achieve Goals: Good  OT Frequency: Min 2X/week           Co-evaluation PT/OT/SLP Co-Evaluation/Treatment: Yes Reason for Co-Treatment: To address functional/ADL transfers;For patient/therapist safety PT goals addressed during session: Mobility/safety with mobility;Balance;Proper use of DME;Strengthening/ROM OT goals addressed during session: ADL's and self-care;Strengthening/ROM;Proper use of Adaptive equipment and DME      AM-PAC OT "6 Clicks" Daily Activity     Outcome Measure Help from another person eating meals?: None Help from another person taking care of personal grooming?: A  Little Help from another person toileting, which includes using toliet, bedpan, or urinal?: Total Help from another person bathing (including washing, rinsing, drying)?: Total Help from another person to put on and taking off regular upper body clothing?: A Little Help from another person to put on and taking off regular lower body clothing?: Total 6 Click Score: 13   End of Session Equipment Utilized During Treatment: Gait belt;Rolling walker Nurse Communication: Mobility status  Activity Tolerance: Patient limited by pain;Patient tolerated treatment well Patient left: in bed;with call bell/phone within reach;with bed alarm set  OT Visit Diagnosis: History of falling (Z91.81);Muscle weakness (generalized) (M62.81)                Time: 9379-0240 OT Time Calculation (min): 30 min Charges:  OT General Charges $OT Visit: 1 Visit OT Evaluation $OT Eval Low Complexity: 1 Low  Limmie Patricia, OTR/L,CBIS  [phone]   Joniyah Mallinger, Charisse March 02/19/2020, 10:27 AM

## 2020-02-19 NOTE — Plan of Care (Signed)
  Problem: Acute Rehab OT Goals (only OT should resolve) Goal: Pt. Will Perform Grooming Flowsheets (Taken 02/19/2020 1033) Pt Will Perform Grooming:  with modified independence  sitting Goal: Pt. Will Perform Upper Body Bathing Flowsheets (Taken 02/19/2020 1033) Pt Will Perform Upper Body Bathing:  with set-up  sitting Goal: Pt. Will Perform Lower Body Bathing Flowsheets (Taken 02/19/2020 1033) Pt Will Perform Lower Body Bathing:  with max assist  sitting/lateral leans  sit to/from stand  bed level  with adaptive equipment Goal: Pt. Will Perform Upper Body Dressing Flowsheets (Taken 02/19/2020 1033) Pt Will Perform Upper Body Dressing:  with set-up  sitting Goal: Pt. Will Perform Lower Body Dressing Flowsheets (Taken 02/19/2020 1033) Pt Will Perform Lower Body Dressing:  with max assist  sitting/lateral leans  sit to/from stand  with adaptive equipment  bed level Goal: Pt. Will Transfer To Toilet Flowsheets (Taken 02/19/2020 1033) Pt Will Transfer to Toilet:  bedside commode  stand pivot transfer  ambulating  with mod assist Goal: Pt. Will Perform Toileting-Clothing Manipulation Flowsheets (Taken 02/19/2020 1033) Pt Will Perform Toileting - Clothing Manipulation and hygiene:  with mod assist  sit to/from stand  sitting/lateral leans Goal: Pt/Caregiver Will Perform Home Exercise Program Flowsheets (Taken 02/19/2020 1033) Pt/caregiver will Perform Home Exercise Program:  Increased strength  Both right and left upper extremity  With Supervision  With written HEP provided

## 2020-02-19 NOTE — NC FL2 (Addendum)
[place] MEDICAID FL2 LEVEL OF CARE SCREENING TOOL     IDENTIFICATION  Patient Name: Jenna Long Birthdate: Sep 16, 1934 Sex: female Admission Date (Current Location): 02/18/2020  [facility] [place] and [place] Number:  [employer] and Address:  [facility],  [address] [place] [address]      Provider Number: [id number]  Attending Physician Name and Address:  Catarina Hartshorn, MD  Relative Name and Phone Number:  Rennis Chris (Daughter)   [phone]    Current Level of Care: SNF Recommended Level of Care: Skilled Nursing Facility Prior Approval Number:    Date Approved/Denied: 02/19/20 PASRR Number: [id number]  Discharge Plan: SNF    Current Diagnoses: Patient Active Problem List   Diagnosis Date Noted  . Left displaced femoral neck fracture (HCC) 02/18/2020  . Essential hypertension 02/18/2020  . Mixed hyperlipidemia 02/18/2020  . Fall     Orientation RESPIRATION BLADDER Height & Weight     Self,Time,Situation,Place  Normal Continent Weight: 151 lb 0.2 oz (68.5 kg) Height:  5\' 7"  (170.2 cm)  BEHAVIORAL SYMPTOMS/MOOD NEUROLOGICAL BOWEL NUTRITION STATUS      Continent Diet (full liquid Room service appropriate? Yes; Fluid consistency: Thin (Diet maybe advanced upon d/c))  AMBULATORY STATUS COMMUNICATION OF NEEDS Skin   Extensive Assist Verbally Other (Comment),Surgical wounds (Left hip)                       Personal Care Assistance Level of Assistance  Bathing,Feeding,Dressing Bathing Assistance: Maximum assistance Feeding assistance: Independent Dressing Assistance: Maximum assistance     Functional Limitations Info  Sight,Hearing,Speech Sight Info: Adequate Hearing Info: Adequate Speech Info: Adequate    SPECIAL CARE FACTORS FREQUENCY  PT (By licensed PT)     PT Frequency: 5x              Contractures Contractures Info: Not present    Additional Factors Info  Code Status,Allergies,Psychotropic Code Status Info:  Full Allergies Info: N/A Psychotropic Info: Hydrocodone,morphine, Zophran, Fentanyl         Current Medications (02/19/2020):  This is the current hospital active medication list Current Facility-Administered Medications  Medication Dose Route Frequency Provider Last Rate Last Admin  . 0.9 %  sodium chloride infusion   Intravenous Continuous 02/21/2020, MD 125 mL/hr at 02/18/20 1843 New Bag at 02/18/20 1843  . amLODipine (NORVASC) tablet 5 mg  5 mg Oral Daily [name], David, MD   5 mg at 02/19/20 0958  . atorvastatin (LIPITOR) tablet 20 mg  20 mg Oral QHS [name], 02/21/20, MD      . ceFAZolin (ANCEF) IVPB 2g/100 mL premix  2 g Intravenous Q8H Onalee Hua, MD 200 mL/hr at 02/19/20 0554 2 g at 02/19/20 0554  . Chlorhexidine Gluconate Cloth 2 % PADS 6 each  6 each Topical Daily [name], David, MD      . enoxaparin (LOVENOX) injection 40 mg  40 mg Subcutaneous Q24H [name], 02/21/20, MD   40 mg at 02/19/20 0957  . fentaNYL (SUBLIMAZE) injection 50 mcg  50 mcg Intravenous Q30 min PRN 02/21/20, MD   50 mcg at 02/18/20 0756  . HYDROcodone-acetaminophen (NORCO/VICODIN) 5-325 MG per tablet 1-2 tablet  1-2 tablet Oral Q6H PRN 02/20/20, MD   2 tablet at 02/19/20 0400  . insulin aspart (novoLOG) injection 0-5 Units  0-5 Units Subcutaneous 02/21/20, MD   2 Units at 02/18/20 2201  . insulin aspart (novoLOG) injection 0-9 Units  0-9 Units Subcutaneous  TID WC [name], MD   5 Units at 02/19/20 1249  . losartan (COZAAR) tablet 50 mg  50 mg Oral Daily [name], David, MD   50 mg at 02/19/20 0958  . morphine 2 MG/ML injection 0.5 mg  0.5 mg Intravenous Q2H PRN [name], [name], MD   0.5 mg at 02/19/20 1553  . nutrition supplement (JUVEN) (JUVEN) powder packet 1 packet  1 packet Oral BID BM [name], David, MD      . ondansetron [facility]) injection 4 mg  4 mg Intravenous PRN Mordecai Rasmussen, MD   4 mg at 02/18/20 0756  . polyethylene glycol (MIRALAX / GLYCOLAX) packet 17 g  17 g Oral Daily PRN [name], David, MD      . protein  supplement (ENSURE MAX) liquid  11 oz Oral Daily [name], David, MD   11 oz at 02/19/20 1249     Discharge Medications: Please see discharge summary for a list of discharge medications.  Relevant Imaging Results:  Relevant Lab Results:   Additional Information PT SNN: [id number]  Natasha Bence, LCSW

## 2020-02-19 NOTE — Plan of Care (Signed)
  Problem: Acute Rehab PT Goals(only PT should resolve) Goal: Pt Will Go Supine/Side To Sit Outcome: Progressing Flowsheets (Taken 02/19/2020 1042) Pt will go Supine/Side to Sit: with moderate assist Goal: Pt Will Go Sit To Supine/Side Outcome: Progressing Flowsheets (Taken 02/19/2020 1042) Pt will go Sit to Supine/Side: with moderate assist Goal: Patient Will Perform Sitting Balance Outcome: Progressing Flowsheets (Taken 02/19/2020 1042) Patient will perform sitting balance: with supervision Goal: Patient Will Transfer Sit To/From Stand Outcome: Progressing Flowsheets (Taken 02/19/2020 1042) Patient will transfer sit to/from stand: with moderate assist Goal: Pt Will Transfer Bed To Chair/Chair To Bed Outcome: Progressing Flowsheets (Taken 02/19/2020 1042) Pt will Transfer Bed to Chair/Chair to Bed: with mod assist    10:42 AM, 02/19/20 Wyman Songster PT, DPT Physical Therapist at [facility]

## 2020-02-19 NOTE — Progress Notes (Signed)
   ORTHOPAEDIC PROGRESS NOTE  s/p Procedure(s): Left hip hemiarthroplasty for fracture  DOS: 02/18/20  SUBJECTIVE: Patient is complaining of some pain, but this is controlled with pain medications.  She denies numbness and tingling.  Experienced some nausea when standing up with PT earlier this morning.  She is feeling better now.  No issues over night.   OBJECTIVE: PE:  Vitals:   02/19/20 0147 02/19/20 0635  BP: (!) 166/70 (!) 144/62  Pulse: 93 87  Resp: 18 18  Temp: 97.8 F (36.6 C) 98.1 F (36.7 C)  SpO2: 95% 95%   Alert and oriented, no acute distress  Left hip dressing intact.  Small amount of strikethrough. Hip abduction pillow in place Active motion left TA/EHL/GS 2+ DP pulse Sensation intact to left foot, all nerve distributions.   XR of the left hip demonstrates reduced left hip with adverse features  ASSESSMENT: Jenna Long is a 85 y.o. female doing well postoperatively.  PLAN: Weightbearing: WBAT LLE Insicional and dressing care: Dressings left intact until follow-up Orthopedic device(s): Hip abduction pillow in place at all times, unless working with PT/OT.  Posterior hip precautions VTE prophylaxis: Discretion of primary team, no orthopaedic contraindications.  Ok to resume POD#1   Pain control: Multimodal; limit use of narcotics Follow - up plan: 2 weeks Contact information:     Zakyah Yanes A. Dallas Schimke, MD MS [facility] [address] [place],  [place]  [address] Phone: [phone] Fax: [phone]

## 2020-02-19 NOTE — TOC Initial Note (Addendum)
Transition of Care [facility] - Initial/Assessment Note    Patient Details  Name: Jenna Long MRN: [id number] Date of Birth: 02-09-1935  [facility]) CM/SW Contact:    Barry Brunner, LCSW Phone Number: 02/19/2020, 4:35 PM  Clinical Narrative:                 Patient is a 85 year old female admited for a Left Displaced femoral neck fracture. CSW discussed SNF recommendation with patient. Patient agreeable to SNF referral. CSW completed FL2 and faxed referral to local SNF's. CSW started [place]. TOC to follow.   Expected Discharge Plan: Skilled Nursing Facility Barriers to Discharge: Continued Medical Work up   Patient Goals and CMS Choice Patient states their goals for this hospitalization and ongoing recovery are:: Rehab with SNF CMS Medicare.gov Compare Post Acute Care list provided to:: Patient Choice offered to / list presented to : Patient  Expected Discharge Plan and Services Expected Discharge Plan: Skilled Nursing Facility       Living arrangements for the past 2 months: Single Family Home                 DME Arranged: N/A DME Agency: NA       HH Arranged: NA HH Agency: NA        Prior Living Arrangements/Services Living arrangements for the past 2 months: Single Family Home Lives with:: Self Patient language and need for interpreter reviewed:: Yes Do you feel safe going back to the place where you live?: Yes      Need for Family Participation in Patient Care: Yes (Comment) Care giver support system in place?: Yes (comment)   Criminal Activity/Legal Involvement Pertinent to Current Situation/Hospitalization: No - Comment as needed  Activities of Daily Living Home Assistive Devices/Equipment: None ADL Screening (condition at time of admission) Patient's cognitive ability adequate to safely complete daily activities?: Yes Is the patient deaf or have difficulty hearing?: No Does the patient have difficulty seeing, even when wearing  glasses/contacts?: No Does the patient have difficulty concentrating, remembering, or making decisions?: No Patient able to express need for assistance with ADLs?: Yes Does the patient have difficulty dressing or bathing?: No Independently performs ADLs?: Yes (appropriate for developmental age) Does the patient have difficulty walking or climbing stairs?: No Weakness of Legs: None Weakness of Arms/Hands: None  Permission Sought/Granted Permission sought to share information with : Facility Industrial/product designer granted to share information with : Yes, Verbal Permission Granted     Permission granted to share info w AGENCY: Local SNF        Emotional Assessment     Affect (typically observed): Accepting,Adaptable Orientation: : Oriented to Self,Oriented to Situation,Oriented to Place,Oriented to  Time Alcohol / Substance Use: Not Applicable Psych Involvement: No (comment)  Admission diagnosis:  Fall [W19.XXXA] Left displaced femoral neck fracture (HCC) [S72.002A] Closed fracture of left hip, initial encounter (HCC) [S72.002A] Fall, initial encounter [W19.XXXA] Patient Active Problem List   Diagnosis Date Noted  . Left displaced femoral neck fracture (HCC) 02/18/2020  . Essential hypertension 02/18/2020  . Mixed hyperlipidemia 02/18/2020  . Fall    PCP:  Assunta Found, MD Pharmacy:   [facility] - [place], [place] - [address] [place] [address] Phone: [phone] Fax: [phone]     Social Determinants of Health (SDOH) Interventions    Readmission Risk Interventions No flowsheet data found.

## 2020-02-20 LAB — BASIC METABOLIC PANEL
Anion gap: 7 (ref 5–15)
BUN: 14 mg/dL (ref 8–23)
CO2: 23 mmol/L (ref 22–32)
Calcium: 8.1 mg/dL — ABNORMAL LOW (ref 8.9–10.3)
Chloride: 104 mmol/L (ref 98–111)
Creatinine, Ser: 0.58 mg/dL (ref 0.44–1.00)
GFR, Estimated: >60 mL/min (ref >60)
Glucose, Bld: 221 mg/dL — ABNORMAL HIGH (ref 70–99)
Potassium: 3.2 mmol/L — ABNORMAL LOW (ref 3.5–5.1)
Sodium: 134 mmol/L — ABNORMAL LOW (ref 135–145)

## 2020-02-20 LAB — GLUCOSE, CAPILLARY
Glucose-Capillary: 212 mg/dL — ABNORMAL HIGH (ref 70–99)
Glucose-Capillary: 178 mg/dL — ABNORMAL HIGH (ref 70–99)
Glucose-Capillary: 219 mg/dL — ABNORMAL HIGH (ref 70–99)
Glucose-Capillary: 195 mg/dL — ABNORMAL HIGH (ref 70–99)

## 2020-02-20 LAB — MAGNESIUM: Magnesium: 1.5 mg/dL — ABNORMAL LOW (ref 1.7–2.4)

## 2020-02-20 LAB — PHOSPHORUS: Phosphorus: 1.7 mg/dL — ABNORMAL LOW (ref 2.5–4.6)

## 2020-02-20 MED ORDER — ATORVASTATIN CALCIUM 20 MG PO TABS: 20.0000 mg | ORAL_TABLET | Freq: Every day | ORAL | Status: DC

## 2020-02-20 MED ORDER — COVID-19 MRNA VACC (MODERNA) 100 MCG/0.5ML IM SUSP
0.5000 mL | Freq: Once | INTRAMUSCULAR | Status: AC
  Administered 2020-02-21: 0.5 mL via INTRAMUSCULAR
  Filled 2020-02-20: qty 0.5

## 2020-02-20 MED ORDER — K PHOS MONO-SOD PHOS DI & MONO 155-852-130 MG PO TABS: 500.0000 mg | ORAL_TABLET | Freq: Two times a day (BID) | ORAL | 0 refills | Status: DC

## 2020-02-20 MED ORDER — HYDROCODONE-ACETAMINOPHEN 5-325 MG PO TABS: 1.0000 | ORAL_TABLET | Freq: Four times a day (QID) | ORAL | 0 refills | Status: DC | PRN

## 2020-02-20 MED ORDER — JUVEN PO PACK: 1.0000 | PACK | Freq: Two times a day (BID) | ORAL | 0 refills | Status: DC

## 2020-02-20 MED ORDER — ENOXAPARIN SODIUM 40 MG/0.4ML ~~LOC~~ SOLN: 40.0000 mg | SUBCUTANEOUS | Status: DC

## 2020-02-20 MED ORDER — K PHOS MONO-SOD PHOS DI & MONO 155-852-130 MG PO TABS
500.0000 mg | ORAL_TABLET | Freq: Two times a day (BID) | ORAL | Status: DC
  Administered 2020-02-20 – 2020-02-22 (×5): 500 mg via ORAL
  Filled 2020-02-20 (×5): qty 2

## 2020-02-20 MED ORDER — MAGNESIUM SULFATE 2 GM/50ML IV SOLN
2.0000 g | Freq: Once | INTRAVENOUS | Status: AC
  Administered 2020-02-20: 2 g via INTRAVENOUS
  Filled 2020-02-20: qty 50

## 2020-02-20 MED ORDER — POTASSIUM CHLORIDE CRYS ER 20 MEQ PO TBCR
20.0000 meq | EXTENDED_RELEASE_TABLET | Freq: Once | ORAL | Status: AC
  Administered 2020-02-20: 20 meq via ORAL
  Filled 2020-02-20: qty 1

## 2020-02-20 MED ORDER — ENSURE MAX PROTEIN PO LIQD: 11.0000 [oz_av] | Freq: Every day | ORAL | Status: DC

## 2020-02-20 NOTE — Progress Notes (Signed)
   ORTHOPAEDIC PROGRESS NOTE  s/p Procedure(s): Left hip hemiarthroplasty for fracture  DOS: 02/18/20  SUBJECTIVE: No issues over night.  She is complaining of hip pain.  No numbness or tingling.  Nausea has improved.  She is not wearing her hip abduction pillow this morning.    OBJECTIVE: PE:  Vitals:   02/19/20 1429 02/19/20 2152  BP: (!) 151/77 (!) 165/61  Pulse: 100 93  Resp: 18   Temp: (!) 101.7 F (38.7 C) 99.1 F (37.3 C)  SpO2: 95% 95%   Alert and oriented, no acute distress  Left hip dressing intact.  Small amount of strikethrough. Hip abduction pillow is not in place Active motion left TA/EHL/GS 2+ DP pulse Sensation intact to left foot, all nerve distributions.   XR of the left hip demonstrates reduced left hip with adverse features  ASSESSMENT: Jenna Long is a 86 y.o. female doing well postoperatively.  PLAN: Weightbearing: WBAT LLE Insicional and dressing care: Dressings left intact until follow-up Orthopedic device(s): Hip abduction pillow in place at all times, unless working with PT/OT.  Posterior hip precautions VTE prophylaxis: Discretion of primary team, no orthopaedic contraindications.  Ok to resume POD#1   Pain control: Multimodal; limit use of narcotics Follow - up plan: 2 weeks; if going to SNF and they are comfortable removing the staples, she does not need to return for a 2 week follow up.  If staples removed in SNF, can return for f/u 6 weeks postop.  Contact information:     Mark A. Dallas Schimke, MD MS [facility] [address] [place],  [place]  [address] Phone: [phone] Fax: [phone]

## 2020-02-20 NOTE — Progress Notes (Signed)
PROGRESS NOTE  Jenna Long [id number] DOB: Oct 03, 1934 DOA: 02/18/2020 PCP: Sharilyn Sites, MD  Brief History:  86 y.o.femalewith medical history ofdiabetes mellitus type 2, hypertension, hyperlipidemia presenting after mechanical fall. The patient was outside checking on her kerosene oil. The patient sustained a mechanical fall while stepping off a cinderblock and fell onto her left side. She tried to stand up but was unable to do that nor bear weight onto her left leg. She subsequently crawled back to her front porch where she stayed through the night. The patient denied any loss of consciousness. She denied any prodromal symptoms. On the morning of 02/18/2020, the patient was able to get the attention of her neighbor who subsequently contacted EMS to bring the patient to the hospital for further evaluation. In the emergency department, the patient was afebrile and hemodynamically stable with oxygen saturation 97% room air. BMP and LFTs were unremarkable. WBC 11.0, hemoglobin 11.8, platelets 246,000. Chest x-ray was negative. EKG was sinus rhythm with no ST-T wave changes. X-ray of the left hip showed an acute impacted left femoral neck fracture. Orthopedics was consulted.She underwent left hip hemiarthroplasty on 02/18/20. On POD#1 she developed a fever of 101.7.  Blood cultures were drawn and remain negative at the time of d/c.  UA showed significant pyuria, but culture remained pending.  As this may be the cause of fever, she was started on empiric cefdinir x 3 days.  CXR on 02/18/20 did not show any infiltrates.  Subsequently, patient remained afebrile and hemodynamically stable  Assessment/Plan: Acute left femoral neck fracture -Orthopedics consulted -left hip hemiarthroplasty on 02/18/20 -Post op care per ortho -continue DVT prophylaxis x 24 more days after d/c -PT/OT-->SNF -judicious opioids  Essential hypertension -Restart amlodipine and losartan -BP  elevated at this time as the patient has not had her antihypertensive medications  Post op fever -had temp 101.7 on 12/31 -UA--21-50WBC -urine culture--pending -start empiric cefidinir x 3 days -blood culture x 2 -12/30 CXR--no infiltrates -has remained afebrile and hemodynamically stable since initial fever  Diabetes mellitus type 2 -Holding Metformin--restart after d/c -Check hemoglobin A1c--7.6 -NovoLog sliding scale -restart januvia after d/c  Hyperlipidemia -Continue statin-->changed simvastatin to atorvastatin due to interaction with amlodipine\  Hypokalemia/Hypomagnesemia/Hypophosphatemia -replete     Status is: Inpatient  Remains inpatient appropriate because:IV treatments appropriate due to intensity of illness or inability to take PO   Dispo: The patient is from: Home              Anticipated d/c is to: SNF              Anticipated d/c date is: 1 day              Patient currently is medically stable to d/c.  Insurance authorization delay is barrier for discharge        Family Communication: no  Family at bedside  Consultants:  ortho  Code Status:  FULL  DVT Prophylaxis:  [place] Lovenox   Procedures: As Listed in Progress Note Above  Antibiotics: Peri-operative cefazolin -cefdinir 02/20/20>>>      Subjective: Patient complains of left hip pain.  Denies f/c, cp, sob, abd pain.  Objective: Vitals:   02/19/20 0635 02/19/20 1429 02/19/20 2152 02/20/20 1525  BP: (!) 144/62 (!) 151/77 (!) 165/61 (!) 172/74  Pulse: 87 100 93 96  Resp: 18 18  18   Temp: 98.1 F (36.7 C) (!) 101.7 F (38.7 C) 99.1 F (  37.3 C) 99.1 F (37.3 C)  TempSrc: Oral Oral Oral Oral  SpO2: 95% 95% 95% 96%  Weight:      Height:        Intake/Output Summary (Last 24 hours) at 02/20/2020 1538 Last data filed at 02/20/2020 1500 Gross per 24 hour  Intake 550.56 ml  Output 700 ml  Net -149.44 ml   Weight change:  Exam:   General:  Pt is alert, follows commands  appropriately, not in acute distress  HEENT: No icterus, No thrush, No neck mass, [place]/AT  Cardiovascular: RRR, S1/S2, no rubs, no gallops  Respiratory:bibasilar rales. No wheeze  Abdomen: Soft/+BS, non tender, non distended, no guarding  Extremities: No edema, No lymphangitis, No petechiae, No rashes, no synovitis   Data Reviewed: I have personally reviewed following labs and imaging studies Basic Metabolic Panel: Recent Labs  Lab 02/18/20 0833 02/19/20 0525 02/20/20 0620  NA 136 133* 134*  K 4.2 3.5 3.2*  CL 102 103 104  CO2 22 23 23   GLUCOSE 372* 267* 221*  BUN 25* 18 14  CREATININE 0.74 0.74 0.58  CALCIUM 9.3 8.3* 8.1*  MG  --   --  1.5*  PHOS  --   --  1.7*   Liver Function Tests: Recent Labs  Lab 02/18/20 0833  AST 23  ALT 18  ALKPHOS 72  BILITOT 0.9  PROT 7.6  ALBUMIN 3.9   No results for input(s): LIPASE, AMYLASE in the last 168 hours. No results for input(s): AMMONIA in the last 168 hours. Coagulation Profile: No results for input(s): INR, PROTIME in the last 168 hours. CBC: Recent Labs  Lab 02/18/20 0833 02/19/20 0525  WBC 11.0* 9.1  NEUTROABS 9.1*  --   HGB 11.8* 9.2*  HCT 34.4* 27.4*  MCV 96.4 99.3  PLT 246 185   Cardiac Enzymes: Recent Labs  Lab 02/18/20 0833  CKTOTAL 567*   BNP: Invalid input(s): POCBNP CBG: Recent Labs  Lab 02/19/20 1110 02/19/20 1644 02/19/20 2123 02/20/20 0729 02/20/20 1116  GLUCAP 255* 168* 203* 212* 178*   HbA1C: Recent Labs    02/18/20 0833  HGBA1C 7.6*   Urine analysis:    Component Value Date/Time   COLORURINE YELLOW 02/19/2020 1646   APPEARANCEUR HAZY (A) 02/19/2020 1646   LABSPEC 1.020 02/19/2020 1646   PHURINE 5.0 02/19/2020 1646   GLUCOSEU >=500 (A) 02/19/2020 1646   HGBUR NEGATIVE 02/19/2020 1646   BILIRUBINUR NEGATIVE 02/19/2020 1646   KETONESUR NEGATIVE 02/19/2020 1646   PROTEINUR 30 (A) 02/19/2020 1646   NITRITE NEGATIVE 02/19/2020 1646   LEUKOCYTESUR SMALL (A) 02/19/2020 1646    Sepsis Labs: @LABRCNTIP (procalcitonin:4,lacticidven:4) ) Recent Results (from the past 240 hour(s))  Resp Panel by RT-PCR (Flu A&B, Covid) Nasopharyngeal Swab     Status: None   Collection Time: 02/18/20  8:46 AM   Specimen: Nasopharyngeal Swab; Nasopharyngeal(NP) swabs in vial transport medium  Result Value Ref Range Status   SARS Coronavirus 2 by RT PCR NEGATIVE NEGATIVE Final    Comment: (NOTE) SARS-CoV-2 target nucleic acids are NOT DETECTED.  The SARS-CoV-2 RNA is generally detectable in upper respiratory specimens during the acute phase of infection. The lowest concentration of SARS-CoV-2 viral copies this assay can detect is 138 copies/mL. A negative result does not preclude SARS-Cov-2 infection and should not be used as the sole basis for treatment or other patient management decisions. A negative result may occur with  improper specimen collection/handling, submission of specimen other than nasopharyngeal swab, presence of viral mutation(s)  within the areas targeted by this assay, and inadequate number of viral copies(<138 copies/mL). A negative result must be combined with clinical observations, patient history, and epidemiological information. The expected result is Negative.  Fact Sheet for Patients:  EntrepreneurPulse.com.au  Fact Sheet for Healthcare Providers:  IncredibleEmployment.be  This test is no t yet approved or cleared by the [place] FDA and  has been authorized for detection and/or diagnosis of SARS-CoV-2 by FDA under an Emergency Use Authorization (EUA). This EUA will remain  in effect (meaning this test can be used) for the duration of the COVID-19 declaration under Section 564(b)(1) of the Act, 21 U.S.C.section 360bbb-3(b)(1), unless the authorization is terminated  or revoked sooner.       Influenza A by PCR NEGATIVE NEGATIVE Final   Influenza B by PCR NEGATIVE NEGATIVE Final    Comment: (NOTE) The  Xpert Xpress SARS-CoV-2/FLU/RSV plus assay is intended as an aid in the diagnosis of influenza from Nasopharyngeal swab specimens and should not be used as a sole basis for treatment. Nasal washings and aspirates are unacceptable for Xpert Xpress SARS-CoV-2/FLU/RSV testing.  Fact Sheet for Patients: EntrepreneurPulse.com.au  Fact Sheet for Healthcare Providers: IncredibleEmployment.be  This test is not yet approved or cleared by the [place] FDA and has been authorized for detection and/or diagnosis of SARS-CoV-2 by FDA under an Emergency Use Authorization (EUA). This EUA will remain in effect (meaning this test can be used) for the duration of the COVID-19 declaration under Section 564(b)(1) of the Act, 21 U.S.C. section 360bbb-3(b)(1), unless the authorization is terminated or revoked.  Performed at [facility], [address], [place], [place] [address]   Culture, blood (Routine X 2) w Reflex to ID Panel     Status: None (Preliminary result)   Collection Time: 02/19/20  3:03 PM   Specimen: BLOOD LEFT ARM  Result Value Ref Range Status   Specimen Description BLOOD LEFT ARM  Final   Special Requests   Final    BOTTLES DRAWN AEROBIC AND ANAEROBIC Blood Culture adequate volume   Culture   Final    NO GROWTH < 24 HOURS Performed at [facility], [address], [place], [place] [address]    Report Status PENDING  Incomplete  Culture, blood (Routine X 2) w Reflex to ID Panel     Status: None (Preliminary result)   Collection Time: 02/19/20  3:12 PM   Specimen: BLOOD LEFT HAND  Result Value Ref Range Status   Specimen Description BLOOD LEFT HAND  Final   Special Requests   Final    BOTTLES DRAWN AEROBIC AND ANAEROBIC Blood Culture adequate volume   Culture   Final    NO GROWTH < 24 HOURS Performed at [facility], [address], [place], [place] [address]    Report Status PENDING  Incomplete     Scheduled Meds: . amLODipine  5 mg  Oral Daily  . atorvastatin  20 mg Oral QHS  . Chlorhexidine Gluconate Cloth  6 each Topical Daily  . [START ON 02/21/2020] COVID-19 mRNA vaccine (Moderna)  0.5 mL Intramuscular ONCE-1600  . enoxaparin (LOVENOX) injection  40 mg Subcutaneous Q24H  . insulin aspart  0-5 Units Subcutaneous QHS  . insulin aspart  0-9 Units Subcutaneous TID WC  . losartan  50 mg Oral Daily  . nutrition supplement (JUVEN)  1 packet Oral BID BM  . phosphorus  500 mg Oral BID  . Ensure Max Protein  11 oz Oral Daily   Continuous Infusions:  Procedures/Studies: DG Chest Port 1 View  Result Date: 02/18/2020 CLINICAL DATA:  86 year old female status post fall last night, found down by neighbor this morning. Left hip fracture. EXAM: PORTABLE CHEST 1 VIEW COMPARISON:  None. FINDINGS: Portable AP semi upright view at 0810 hours. Somewhat low lung volumes. Mediastinal contours within normal limits. Visualized tracheal air column is within normal limits. Allowing for portable technique the lungs are clear. Negative visible bowel gas pattern. No acute osseous abnormality identified. IMPRESSION: No acute cardiopulmonary abnormality. Electronically Signed   By: Odessa Fleming M.D.   On: 02/18/2020 08:22   DG HIP UNILAT WITH PELVIS 2-3 VIEWS LEFT  Result Date: 02/18/2020 CLINICAL DATA:  Left hip fracture status post arthroplasty EXAM: DG HIP (WITH OR WITHOUT PELVIS) 2-3V LEFT COMPARISON:  02/18/2020 FINDINGS: Frontal view of the pelvis as well as frontal and frogleg lateral views of the left hip demonstrate left hip hemiarthroplasty in the expected position without signs of acute complication. Postsurgical changes are seen within the overlying soft tissues. The remainder of the bony pelvis is unremarkable. IMPRESSION: 1. Unremarkable left hip hemiarthroplasty. Electronically Signed   By: Sharlet Salina M.D.   On: 02/18/2020 18:03   DG Hip Unilat With Pelvis 2-3 Views Left  Result Date: 02/18/2020 CLINICAL DATA:  86 year old female  status post fall last night, found down by neighbor this morning. Left hip pain. EXAM: DG HIP (WITH OR WITHOUT PELVIS) 2-3V LEFT COMPARISON:  None. FINDINGS: Portable views of the pelvis and left hip. Impacted fracture of the left femoral neck. The intertrochanteric segment appears to remain intact. Left femoral head normally located. No superimposed pelvis fracture. Grossly intact proximal right femur. Bone mineralization is within normal limits for age. Negative visible bowel gas pattern. IMPRESSION: Acute impacted fracture of the left femoral neck. Electronically Signed   By: Odessa Fleming M.D.   On: 02/18/2020 08:21    Catarina Hartshorn, DO  [facility]  If 7PM-7AM, please contact night-coverage [email or web address] Password [email or web address] 02/20/2020, 3:38 PM   LOS: 2 days

## 2020-02-20 NOTE — Discharge Summary (Addendum)
Physician Discharge Summary  Jenna Long [id number] DOB: 1934-10-15 DOA: 02/18/2020  PCP: Sharilyn Sites, MD  Admit date: 02/18/2020 Discharge date: 02/20/2020  Admitted From: Home Disposition:  SNF  Recommendations for Outpatient Follow-up:  1. Follow up with PCP in 1-2 weeks 2. Please obtain BMP/CBC in one week 3. Follow up orthopedics, Dr. Amedeo Kinsman in 2 weeks     Discharge Condition: Stable CODE STATUS: FULL Diet recommendation: Heart Healthy / Carb Modified   Brief/Interim Summary: 86 y.o.femalewith medical history ofdiabetes mellitus type 2, hypertension, hyperlipidemia presenting after mechanical fall. The patient was outside checking on her kerosene oil. The patient sustained a mechanical fall while stepping off a cinderblock and fell onto her left side. She tried to stand up but was unable to do that nor bear weight onto her left leg. She subsequently crawled back to her front porch where she stayed through the night. The patient denied any loss of consciousness. She denied any prodromal symptoms. On the morning of 02/18/2020, the patient was able to get the attention of her neighbor who subsequently contacted EMS to bring the patient to the hospital for further evaluation. In the emergency department, the patient was afebrile and hemodynamically stable with oxygen saturation 97% room air. BMP and LFTs were unremarkable. WBC 11.0, hemoglobin 11.8, platelets 246,000. Chest x-ray was negative. EKG was sinus rhythm with no ST-T wave changes. X-ray of the left hip showed an acute impacted left femoral neck fracture. Orthopedics was consulted.  She underwent left hip hemiarthroplasty on 02/18/20. On POD#1 she developed a fever of 101.7.  Blood cultures were drawn and remain negative at the time of d/c.  UA showed significant pyuria, but culture remained pending.  As this may be the cause of fever, she was started on empiric cefdinir x 3 days.  CXR on 02/18/20 did not  show any infiltrates.  Subsequently, patient remained afebrile and hemodynamically stable  Discharge Diagnoses:  Acute left femoral neck fracture -Orthopedics consulted - left hip hemiarthroplasty on 02/18/20 -Post op care per ortho -continue DVT prophylaxis x 24 more days after d/c -PT/OT-->SNF -judicious opioids  Essential hypertension -Restart amlodipine and losartan -BP elevated at this time as the patient has not had her antihypertensive medications  Post op fever -had temp 101.7 on 12/31 -UA--21-50WBC -urine culture--pending -start empiric cefidinir x 3 days -blood culture x 2 -12/30 CXR--no infiltrates -has remained afebrile and hemodynamically stable since initial fever  Diabetes mellitus type 2 -Holding Metformin--restart after d/c -Check hemoglobin A1c--7.6 -NovoLog sliding scale -restart januvia after d/c  Hyperlipidemia -Continue statin-->changed simvastatin to atorvastatin due to interaction with amlodipine\  Hypokalemia/Hypomagnesemia/Hypophosphatemia -replete   Discharge Instructions   Allergies as of 02/20/2020   No Known Allergies     Medication List    STOP taking these medications   pravastatin 40 MG tablet Commonly known as: PRAVACHOL   simvastatin 40 MG tablet Commonly known as: ZOCOR     TAKE these medications   amLODipine 5 MG tablet Commonly known as: NORVASC Take 5 mg by mouth daily.   atorvastatin 20 MG tablet Commonly known as: LIPITOR Take 1 tablet (20 mg total) by mouth at bedtime.   enoxaparin 40 MG/0.4ML injection Commonly known as: LOVENOX Inject 0.4 mLs (40 mg total) into the skin daily. X 24 days Start taking on: February 21, 2020   HYDROcodone-acetaminophen 5-325 MG tablet Commonly known as: NORCO/VICODIN Take 1-2 tablets by mouth every 6 (six) hours as needed for moderate pain.   Januvia 100 MG tablet  Generic drug: sitaGLIPtin Take 100 mg by mouth daily.   losartan 50 MG tablet Commonly known as:  COZAAR Take 50 mg by mouth daily.   metFORMIN 1000 MG tablet Commonly known as: GLUCOPHAGE Take 1,000 mg by mouth 2 (two) times daily with a meal.   nutrition supplement (JUVEN) Pack Take 1 packet by mouth 2 (two) times daily between meals.   Ensure Max Protein Liqd Take 330 mLs (11 oz total) by mouth daily. Start taking on: February 21, 2020   phosphorus [id number] MG tablet Commonly known as: K PHOS NEUTRAL Take 2 tablets (500 mg total) by mouth 2 (two) times daily. X 3 days       No Known Allergies  Consultations:  Ortho--Dr. Dallas Schimke   Procedures/Studies: DG Chest Port 1 View  Result Date: 02/18/2020 CLINICAL DATA:  86 year old female status post fall last night, found down by neighbor this morning. Left hip fracture. EXAM: PORTABLE CHEST 1 VIEW COMPARISON:  None. FINDINGS: Portable AP semi upright view at 0810 hours. Somewhat low lung volumes. Mediastinal contours within normal limits. Visualized tracheal air column is within normal limits. Allowing for portable technique the lungs are clear. Negative visible bowel gas pattern. No acute osseous abnormality identified. IMPRESSION: No acute cardiopulmonary abnormality. Electronically Signed   By: Odessa Fleming M.D.   On: 02/18/2020 08:22   DG HIP UNILAT WITH PELVIS 2-3 VIEWS LEFT  Result Date: 02/18/2020 CLINICAL DATA:  Left hip fracture status post arthroplasty EXAM: DG HIP (WITH OR WITHOUT PELVIS) 2-3V LEFT COMPARISON:  02/18/2020 FINDINGS: Frontal view of the pelvis as well as frontal and frogleg lateral views of the left hip demonstrate left hip hemiarthroplasty in the expected position without signs of acute complication. Postsurgical changes are seen within the overlying soft tissues. The remainder of the bony pelvis is unremarkable. IMPRESSION: 1. Unremarkable left hip hemiarthroplasty. Electronically Signed   By: Sharlet Salina M.D.   On: 02/18/2020 18:03   DG Hip Unilat With Pelvis 2-3 Views Left  Result Date:  02/18/2020 CLINICAL DATA:  86 year old female status post fall last night, found down by neighbor this morning. Left hip pain. EXAM: DG HIP (WITH OR WITHOUT PELVIS) 2-3V LEFT COMPARISON:  None. FINDINGS: Portable views of the pelvis and left hip. Impacted fracture of the left femoral neck. The intertrochanteric segment appears to remain intact. Left femoral head normally located. No superimposed pelvis fracture. Grossly intact proximal right femur. Bone mineralization is within normal limits for age. Negative visible bowel gas pattern. IMPRESSION: Acute impacted fracture of the left femoral neck. Electronically Signed   By: Odessa Fleming M.D.   On: 02/18/2020 08:21        Discharge Exam: Vitals:   02/19/20 1429 02/19/20 2152  BP: (!) 151/77 (!) 165/61  Pulse: 100 93  Resp: 18   Temp: (!) 101.7 F (38.7 C) 99.1 F (37.3 C)  SpO2: 95% 95%   Vitals:   02/19/20 0147 02/19/20 0635 02/19/20 1429 02/19/20 2152  BP: (!) 166/70 (!) 144/62 (!) 151/77 (!) 165/61  Pulse: 93 87 100 93  Resp: 18 18 18    Temp: 97.8 F (36.6 C) 98.1 F (36.7 C) (!) 101.7 F (38.7 C) 99.1 F (37.3 C)  TempSrc: Oral Oral Oral Oral  SpO2: 95% 95% 95% 95%  Weight:      Height:        General: Pt is alert, awake, not in acute distress Cardiovascular: RRR, S1/S2 +, no rubs, no gallops Respiratory: bibasilar crackles. No wheeze Abdominal:  Soft, NT, ND, bowel sounds + Extremities: trace LE edema, no cyanosis   The results of significant diagnostics from this hospitalization (including imaging, microbiology, ancillary and laboratory) are listed below for reference.    Significant Diagnostic Studies: DG Chest Port 1 View  Result Date: 02/18/2020 CLINICAL DATA:  86 year old female status post fall last night, found down by neighbor this morning. Left hip fracture. EXAM: PORTABLE CHEST 1 VIEW COMPARISON:  None. FINDINGS: Portable AP semi upright view at 0810 hours. Somewhat low lung volumes. Mediastinal contours within  normal limits. Visualized tracheal air column is within normal limits. Allowing for portable technique the lungs are clear. Negative visible bowel gas pattern. No acute osseous abnormality identified. IMPRESSION: No acute cardiopulmonary abnormality. Electronically Signed   By: Odessa Fleming M.D.   On: 02/18/2020 08:22   DG HIP UNILAT WITH PELVIS 2-3 VIEWS LEFT  Result Date: 02/18/2020 CLINICAL DATA:  Left hip fracture status post arthroplasty EXAM: DG HIP (WITH OR WITHOUT PELVIS) 2-3V LEFT COMPARISON:  02/18/2020 FINDINGS: Frontal view of the pelvis as well as frontal and frogleg lateral views of the left hip demonstrate left hip hemiarthroplasty in the expected position without signs of acute complication. Postsurgical changes are seen within the overlying soft tissues. The remainder of the bony pelvis is unremarkable. IMPRESSION: 1. Unremarkable left hip hemiarthroplasty. Electronically Signed   By: Sharlet Salina M.D.   On: 02/18/2020 18:03   DG Hip Unilat With Pelvis 2-3 Views Left  Result Date: 02/18/2020 CLINICAL DATA:  86 year old female status post fall last night, found down by neighbor this morning. Left hip pain. EXAM: DG HIP (WITH OR WITHOUT PELVIS) 2-3V LEFT COMPARISON:  None. FINDINGS: Portable views of the pelvis and left hip. Impacted fracture of the left femoral neck. The intertrochanteric segment appears to remain intact. Left femoral head normally located. No superimposed pelvis fracture. Grossly intact proximal right femur. Bone mineralization is within normal limits for age. Negative visible bowel gas pattern. IMPRESSION: Acute impacted fracture of the left femoral neck. Electronically Signed   By: Odessa Fleming M.D.   On: 02/18/2020 08:21     Microbiology: Recent Results (from the past 240 hour(s))  Resp Panel by RT-PCR (Flu A&B, Covid) Nasopharyngeal Swab     Status: None   Collection Time: 02/18/20  8:46 AM   Specimen: Nasopharyngeal Swab; Nasopharyngeal(NP) swabs in vial transport  medium  Result Value Ref Range Status   SARS Coronavirus 2 by RT PCR NEGATIVE NEGATIVE Final    Comment: (NOTE) SARS-CoV-2 target nucleic acids are NOT DETECTED.  The SARS-CoV-2 RNA is generally detectable in upper respiratory specimens during the acute phase of infection. The lowest concentration of SARS-CoV-2 viral copies this assay can detect is 138 copies/mL. A negative result does not preclude SARS-Cov-2 infection and should not be used as the sole basis for treatment or other patient management decisions. A negative result may occur with  improper specimen collection/handling, submission of specimen other than nasopharyngeal swab, presence of viral mutation(s) within the areas targeted by this assay, and inadequate number of viral copies(<138 copies/mL). A negative result must be combined with clinical observations, patient history, and epidemiological information. The expected result is Negative.  Fact Sheet for Patients:  BloggerCourse.com  Fact Sheet for Healthcare Providers:  SeriousBroker.it  This test is no t yet approved or cleared by the [place] FDA and  has been authorized for detection and/or diagnosis of SARS-CoV-2 by FDA under an Emergency Use Authorization (EUA). This EUA will remain  in effect (meaning this test can be used) for the duration of the COVID-19 declaration under Section 564(b)(1) of the Act, 21 U.S.C.section 360bbb-3(b)(1), unless the authorization is terminated  or revoked sooner.       Influenza A by PCR NEGATIVE NEGATIVE Final   Influenza B by PCR NEGATIVE NEGATIVE Final    Comment: (NOTE) The Xpert Xpress SARS-CoV-2/FLU/RSV plus assay is intended as an aid in the diagnosis of influenza from Nasopharyngeal swab specimens and should not be used as a sole basis for treatment. Nasal washings and aspirates are unacceptable for Xpert Xpress SARS-CoV-2/FLU/RSV testing.  Fact Sheet for  Patients: EntrepreneurPulse.com.au  Fact Sheet for Healthcare Providers: IncredibleEmployment.be  This test is not yet approved or cleared by the [place] FDA and has been authorized for detection and/or diagnosis of SARS-CoV-2 by FDA under an Emergency Use Authorization (EUA). This EUA will remain in effect (meaning this test can be used) for the duration of the COVID-19 declaration under Section 564(b)(1) of the Act, 21 U.S.C. section 360bbb-3(b)(1), unless the authorization is terminated or revoked.  Performed at [facility], [address], [place], [place] [address]   Culture, blood (Routine X 2) w Reflex to ID Panel     Status: None (Preliminary result)   Collection Time: 02/19/20  3:03 PM   Specimen: BLOOD LEFT ARM  Result Value Ref Range Status   Specimen Description BLOOD LEFT ARM  Final   Special Requests   Final    BOTTLES DRAWN AEROBIC AND ANAEROBIC Blood Culture adequate volume   Culture   Final    NO GROWTH < 24 HOURS Performed at [facility], [address] [place], [place] [address]    Report Status PENDING  Incomplete  Culture, blood (Routine X 2) w Reflex to ID Panel     Status: None (Preliminary result)   Collection Time: 02/19/20  3:12 PM   Specimen: BLOOD LEFT HAND  Result Value Ref Range Status   Specimen Description BLOOD LEFT HAND  Final   Special Requests   Final    BOTTLES DRAWN AEROBIC AND ANAEROBIC Blood Culture adequate volume   Culture   Final    NO GROWTH < 24 HOURS Performed at [facility], [address] [place], [place] [address]    Report Status PENDING  Incomplete     Labs: Basic Metabolic Panel: Recent Labs  Lab 02/18/20 0833 02/19/20 0525 02/20/20 0620  NA 136 133* 134*  K 4.2 3.5 3.2*  CL 102 103 104  CO2 22 23 23   GLUCOSE 372* 267* 221*  BUN 25* 18 14  CREATININE 0.74 0.74 0.58  CALCIUM 9.3 8.3* 8.1*  MG  --   --  1.5*  PHOS  --   --  1.7*   Liver Function Tests: Recent Labs   Lab 02/18/20 0833  AST 23  ALT 18  ALKPHOS 72  BILITOT 0.9  PROT 7.6  ALBUMIN 3.9   No results for input(s): LIPASE, AMYLASE in the last 168 hours. No results for input(s): AMMONIA in the last 168 hours. CBC: Recent Labs  Lab 02/18/20 0833 02/19/20 0525  WBC 11.0* 9.1  NEUTROABS 9.1*  --   HGB 11.8* 9.2*  HCT 34.4* 27.4*  MCV 96.4 99.3  PLT 246 185   Cardiac Enzymes: Recent Labs  Lab 02/18/20 0833  CKTOTAL 567*   BNP: Invalid input(s): POCBNP CBG: Recent Labs  Lab 02/19/20 0748 02/19/20 1110 02/19/20 1644 02/19/20 2123 02/20/20 0729  GLUCAP 253* 255* 168*  203* 212*    Time coordinating discharge:  36 minutes  Signed:  [name], DO [facility] Pager: [phone] 02/20/2020, 10:30 AM

## 2020-02-21 LAB — URINE CULTURE: Culture: <10000 — AB

## 2020-02-21 LAB — GLUCOSE, CAPILLARY
Glucose-Capillary: 193 mg/dL — ABNORMAL HIGH (ref 70–99)
Glucose-Capillary: 244 mg/dL — ABNORMAL HIGH (ref 70–99)
Glucose-Capillary: 280 mg/dL — ABNORMAL HIGH (ref 70–99)
Glucose-Capillary: 181 mg/dL — ABNORMAL HIGH (ref 70–99)

## 2020-02-21 MED ORDER — ENOXAPARIN SODIUM 40 MG/0.4ML ~~LOC~~ SOLN: 40.0000 mg | SUBCUTANEOUS | Status: DC

## 2020-02-21 NOTE — Discharge Summary (Signed)
Physician Discharge Summary  CANA OBLINGER [id number] DOB: [birth year] DOA: 02/18/2020  PCP: Sharilyn Sites, MD  Admit date: 02/18/2020 Discharge date: 02/22/2020   Admitted From: Home Disposition:  SNF  Recommendations for Outpatient Follow-up:  1. Follow up with PCP in 1-2 weeks 2. Please obtain BMP/CBC in one week     Discharge Condition: Stable CODE STATUS: FULL Diet recommendation: Heart Healthy / Carb Modified   Brief/Interim Summary: 86 y.o.femalewith medical history ofdiabetes mellitus type 2, hypertension, hyperlipidemia presenting after mechanical fall. The patient was outside checking on her kerosene oil. The patient sustained a mechanical fall while stepping off a cinderblock and fell onto her left side. She tried to stand up but was unable to do that nor bear weight onto her left leg. She subsequently crawled back to her front porch where she stayed through the night. The patient denied any loss of consciousness. She denied any prodromal symptoms. On the morning of 02/18/2020, the patient was able to get the attention of her neighbor who subsequently contacted EMS to bring the patient to the hospital for further evaluation. In the emergency department, the patient was afebrile and hemodynamically stable with oxygen saturation 97% room air. BMP and LFTs were unremarkable. WBC 11.0, hemoglobin 11.8, platelets 246,000. Chest x-ray was negative. EKG was sinus rhythm with no ST-T wave changes. X-ray of the left hip showed an acute impacted left femoral neck fracture. Orthopedics was consulted.She underwent left hip hemiarthroplasty on 02/18/20. On POD#1 she developed a fever of 101.7. Blood cultures were drawn and remain negative at the time of d/c. UA showed significant pyuria, but culture remained pending. As this may be the cause of fever, she was started on empiric cefdinir x 3 days. CXR on 02/18/20 did not show any infiltrates. Subsequently, patient  remained afebrile and hemodynamically stable  Discharge Diagnoses:  Acute left femoral neck fracture -Orthopedics consulted -left hip hemiarthroplasty on 02/18/20 -Post op care per ortho -continue DVT prophylaxisx 14 more days after d/c -PT/OT-->SNF -judicious opioids  Essential hypertension -Restart amlodipine and losartan  Post op fever -had temp 101.7 on 12/31 -UA--21-50WBC -urine culture--<10 K organisms -blood culture x 2--neg to date -12/30 CXR--no infiltrates -has remained afebrile and hemodynamically stable since initial fever x 48 hours  Diabetes mellitus type 2 -Holding Metformin--restart after d/c -Check hemoglobin A1c--7.6 -NovoLog sliding scale -restart januvia after d/c  Hyperlipidemia -Continue statin-->changed simvastatin to atorvastatin due to interaction with amlodipine\  Hypokalemia/Hypomagnesemia/Hypophosphatemia -repleted   Discharge Instructions   Allergies as of 02/21/2020   No Known Allergies     Medication List    STOP taking these medications   pravastatin 40 MG tablet Commonly known as: PRAVACHOL   simvastatin 40 MG tablet Commonly known as: ZOCOR     TAKE these medications   amLODipine 5 MG tablet Commonly known as: NORVASC Take 5 mg by mouth daily.   atorvastatin 20 MG tablet Commonly known as: LIPITOR Take 1 tablet (20 mg total) by mouth at bedtime.   enoxaparin 40 MG/0.4ML injection Commonly known as: LOVENOX Inject 0.4 mLs (40 mg total) into the skin daily. X 14 days   HYDROcodone-acetaminophen 5-325 MG tablet Commonly known as: NORCO/VICODIN Take 1-2 tablets by mouth every 6 (six) hours as needed for moderate pain.   Januvia 100 MG tablet Generic drug: sitaGLIPtin Take 100 mg by mouth daily.   losartan 50 MG tablet Commonly known as: COZAAR Take 50 mg by mouth daily.   metFORMIN 1000 MG tablet Commonly known as: GLUCOPHAGE Take  1,000 mg by mouth 2 (two) times daily with a meal.   nutrition  supplement (JUVEN) Pack Take 1 packet by mouth 2 (two) times daily between meals.   Ensure Max Protein Liqd Take 330 mLs (11 oz total) by mouth daily.   phosphorus 155-852-130 MG tablet Commonly known as: K PHOS NEUTRAL Take 2 tablets (500 mg total) by mouth 2 (two) times daily. X 3 days       Contact information for follow-up providers    Mordecai Rasmussen, MD Follow up in 2 week(s).   Specialties: Orthopedic Surgery, Sports Medicine Contact information: [place]. [address] [place] [address] [phone]            Contact information for after-discharge care    [facility] .   Service: Skilled Nursing Contact information: [address] [place] [phone]                 No Known Allergies  Consultations:  Ortho--[place]   Procedures/Studies: DG Chest Port 1 View  Result Date: 02/18/2020 CLINICAL DATA:  86 year old female status post fall last night, found down by neighbor this morning. Left hip fracture. EXAM: PORTABLE CHEST 1 VIEW COMPARISON:  None. FINDINGS: Portable AP semi upright view at 0810 hours. Somewhat low lung volumes. Mediastinal contours within normal limits. Visualized tracheal air column is within normal limits. Allowing for portable technique the lungs are clear. Negative visible bowel gas pattern. No acute osseous abnormality identified. IMPRESSION: No acute cardiopulmonary abnormality. Electronically Signed   By: Genevie Ann M.D.   On: 02/18/2020 08:22   DG HIP UNILAT WITH PELVIS 2-3 VIEWS LEFT  Result Date: 02/18/2020 CLINICAL DATA:  Left hip fracture status post arthroplasty EXAM: DG HIP (WITH OR WITHOUT PELVIS) 2-3V LEFT COMPARISON:  02/18/2020 FINDINGS: Frontal view of the pelvis as well as frontal and frogleg lateral views of the left hip demonstrate left hip hemiarthroplasty in the expected position without signs of acute complication. Postsurgical changes are seen within the  overlying soft tissues. The remainder of the bony pelvis is unremarkable. IMPRESSION: 1. Unremarkable left hip hemiarthroplasty. Electronically Signed   By: Randa Ngo M.D.   On: 02/18/2020 18:03   DG Hip Unilat With Pelvis 2-3 Views Left  Result Date: 02/18/2020 CLINICAL DATA:  86 year old female status post fall last night, found down by neighbor this morning. Left hip pain. EXAM: DG HIP (WITH OR WITHOUT PELVIS) 2-3V LEFT COMPARISON:  None. FINDINGS: Portable views of the pelvis and left hip. Impacted fracture of the left femoral neck. The intertrochanteric segment appears to remain intact. Left femoral head normally located. No superimposed pelvis fracture. Grossly intact proximal right femur. Bone mineralization is within normal limits for age. Negative visible bowel gas pattern. IMPRESSION: Acute impacted fracture of the left femoral neck. Electronically Signed   By: Genevie Ann M.D.   On: 02/18/2020 08:21        Discharge Exam: Vitals:   02/21/20 0624 02/21/20 1408  BP: (!) 149/64 (!) 163/60  Pulse: 83 92  Resp: 16 19  Temp: 97.6 F (36.4 C) 98.3 F (36.8 C)  SpO2: 97% 97%   Vitals:   02/20/20 1525 02/20/20 2217 02/21/20 0624 02/21/20 1408  BP: (!) 172/74 (!) 168/76 (!) 149/64 (!) 163/60  Pulse: 96 91 83 92  Resp: 18 16 16 19   Temp: 99.1 F (37.3 C) 98.2 F (36.8 C) 97.6 F (36.4 C) 98.3 F (36.8 C)  TempSrc: Oral     SpO2: 96% 96% 97% 97%  Weight:      Height:        General: Pt is alert, awake, not in acute distress Cardiovascular: RRR, S1/S2 +, no rubs, no gallops Respiratory: bibasilar rales. No wheeze Abdominal: Soft, NT, ND, bowel sounds + Extremities: no edema, no cyanosis   The results of significant diagnostics from this hospitalization (including imaging, microbiology, ancillary and laboratory) are listed below for reference.    Significant Diagnostic Studies: DG Chest Port 1 View  Result Date: 02/18/2020 CLINICAL DATA:  86 year old female status  post fall last night, found down by neighbor this morning. Left hip fracture. EXAM: PORTABLE CHEST 1 VIEW COMPARISON:  None. FINDINGS: Portable AP semi upright view at 0810 hours. Somewhat low lung volumes. Mediastinal contours within normal limits. Visualized tracheal air column is within normal limits. Allowing for portable technique the lungs are clear. Negative visible bowel gas pattern. No acute osseous abnormality identified. IMPRESSION: No acute cardiopulmonary abnormality. Electronically Signed   By: Odessa Fleming M.D.   On: 02/18/2020 08:22   DG HIP UNILAT WITH PELVIS 2-3 VIEWS LEFT  Result Date: 02/18/2020 CLINICAL DATA:  Left hip fracture status post arthroplasty EXAM: DG HIP (WITH OR WITHOUT PELVIS) 2-3V LEFT COMPARISON:  02/18/2020 FINDINGS: Frontal view of the pelvis as well as frontal and frogleg lateral views of the left hip demonstrate left hip hemiarthroplasty in the expected position without signs of acute complication. Postsurgical changes are seen within the overlying soft tissues. The remainder of the bony pelvis is unremarkable. IMPRESSION: 1. Unremarkable left hip hemiarthroplasty. Electronically Signed   By: Sharlet Salina M.D.   On: 02/18/2020 18:03   DG Hip Unilat With Pelvis 2-3 Views Left  Result Date: 02/18/2020 CLINICAL DATA:  85 year old female status post fall last night, found down by neighbor this morning. Left hip pain. EXAM: DG HIP (WITH OR WITHOUT PELVIS) 2-3V LEFT COMPARISON:  None. FINDINGS: Portable views of the pelvis and left hip. Impacted fracture of the left femoral neck. The intertrochanteric segment appears to remain intact. Left femoral head normally located. No superimposed pelvis fracture. Grossly intact proximal right femur. Bone mineralization is within normal limits for age. Negative visible bowel gas pattern. IMPRESSION: Acute impacted fracture of the left femoral neck. Electronically Signed   By: Odessa Fleming M.D.   On: 02/18/2020 08:21      Microbiology: Recent Results (from the past 240 hour(s))  Resp Panel by RT-PCR (Flu A&B, Covid) Nasopharyngeal Swab     Status: None   Collection Time: 02/18/20  8:46 AM   Specimen: Nasopharyngeal Swab; Nasopharyngeal(NP) swabs in vial transport medium  Result Value Ref Range Status   SARS Coronavirus 2 by RT PCR NEGATIVE NEGATIVE Final    Comment: (NOTE) SARS-CoV-2 target nucleic acids are NOT DETECTED.  The SARS-CoV-2 RNA is generally detectable in upper respiratory specimens during the acute phase of infection. The lowest concentration of SARS-CoV-2 viral copies this assay can detect is 138 copies/mL. A negative result does not preclude SARS-Cov-2 infection and should not be used as the sole basis for treatment or other patient management decisions. A negative result may occur with  improper specimen collection/handling, submission of specimen other than nasopharyngeal swab, presence of viral mutation(s) within the areas targeted by this assay, and inadequate number of viral copies(<138 copies/mL). A negative result must be combined with clinical observations, patient history, and epidemiological information. The expected result is Negative.  Fact Sheet for Patients:  EntrepreneurPulse.com.au  Fact Sheet for Healthcare Providers:  IncredibleEmployment.be  This test is no t yet approved or cleared by the [place] FDA and  has been authorized for detection and/or diagnosis of SARS-CoV-2 by FDA under an Emergency Use Authorization (EUA). This EUA will remain  in effect (meaning this test can be used) for the duration of the COVID-19 declaration under Section 564(b)(1) of the Act, 21 U.S.C.section 360bbb-3(b)(1), unless the authorization is terminated  or revoked sooner.       Influenza A by PCR NEGATIVE NEGATIVE Final   Influenza B by PCR NEGATIVE NEGATIVE Final    Comment: (NOTE) The Xpert Xpress SARS-CoV-2/FLU/RSV plus assay  is intended as an aid in the diagnosis of influenza from Nasopharyngeal swab specimens and should not be used as a sole basis for treatment. Nasal washings and aspirates are unacceptable for Xpert Xpress SARS-CoV-2/FLU/RSV testing.  Fact Sheet for Patients: EntrepreneurPulse.com.au  Fact Sheet for Healthcare Providers: IncredibleEmployment.be  This test is not yet approved or cleared by the [place] FDA and has been authorized for detection and/or diagnosis of SARS-CoV-2 by FDA under an Emergency Use Authorization (EUA). This EUA will remain in effect (meaning this test can be used) for the duration of the COVID-19 declaration under Section 564(b)(1) of the Act, 21 U.S.C. section 360bbb-3(b)(1), unless the authorization is terminated or revoked.  Performed at [facility], [address], [place], [place] [address]   Culture, blood (Routine X 2) w Reflex to ID Panel     Status: None (Preliminary result)   Collection Time: 02/19/20  3:03 PM   Specimen: BLOOD LEFT ARM  Result Value Ref Range Status   Specimen Description BLOOD LEFT ARM  Final   Special Requests   Final    BOTTLES DRAWN AEROBIC AND ANAEROBIC Blood Culture adequate volume   Culture   Final    NO GROWTH 2 DAYS Performed at [facility], [address] [place], [place] [address]    Report Status PENDING  Incomplete  Culture, blood (Routine X 2) w Reflex to ID Panel     Status: None (Preliminary result)   Collection Time: 02/19/20  3:12 PM   Specimen: BLOOD LEFT HAND  Result Value Ref Range Status   Specimen Description BLOOD LEFT HAND  Final   Special Requests   Final    BOTTLES DRAWN AEROBIC AND ANAEROBIC Blood Culture adequate volume   Culture   Final    NO GROWTH 2 DAYS Performed at [facility], [address], [place], [place] [address]    Report Status PENDING  Incomplete  Culture, Urine     Status: Abnormal   Collection Time: 02/19/20  4:46 PM   Specimen: Urine,  Clean Catch  Result Value Ref Range Status   Specimen Description   Final    URINE, CLEAN CATCH Performed at [facility], [address] [place], [place] [address]    Special Requests   Final    NONE Performed at [facility], [address] [place], [place] [address]    Culture (A)  Final    <10,000 COLONIES/mL INSIGNIFICANT GROWTH Performed at [facility], [place] [address], [place], [place] [address]    Report Status 02/21/2020 FINAL  Final     Labs: Basic Metabolic Panel: Recent Labs  Lab 02/18/20 0833 02/19/20 0525 02/20/20 0620  NA 136 133* 134*  K 4.2 3.5 3.2*  CL 102 103 104  CO2 22 23 23   GLUCOSE 372* 267* 221*  BUN 25* 18 14  CREATININE 0.74 0.74 0.58  CALCIUM 9.3 8.3* 8.1*  MG  --   --  1.5*  PHOS  --   --  1.7*   Liver Function Tests: Recent Labs  Lab 02/18/20 0833  AST 23  ALT 18  ALKPHOS 72  BILITOT 0.9  PROT 7.6  ALBUMIN 3.9   No results for input(s): LIPASE, AMYLASE in the last 168 hours. No results for input(s): AMMONIA in the last 168 hours. CBC: Recent Labs  Lab 02/18/20 0833 02/19/20 0525  WBC 11.0* 9.1  NEUTROABS 9.1*  --   HGB 11.8* 9.2*  HCT 34.4* 27.4*  MCV 96.4 99.3  PLT 246 185   Cardiac Enzymes: Recent Labs  Lab 02/18/20 0833  CKTOTAL 567*   BNP: Invalid input(s): POCBNP CBG: Recent Labs  Lab 02/20/20 1608 02/20/20 2218 02/21/20 0722 02/21/20 1120 02/21/20 1635  GLUCAP 219* 195* 193* 244* 280*    Time coordinating discharge:  36 minutes  Signed:  [name], DO [facility] Pager: [phone] 02/21/2020, 5:23 PM

## 2020-02-21 NOTE — Progress Notes (Signed)
Physical Therapy Treatment Patient Details Name: Jenna Long MRN: [id number] DOB: 01/20/1935 Today's Date: 02/21/2020    History of Present Illness Jenna Long is a 86 y.o. female with medical history of diabetes mellitus type 2, hypertension, hyperlipidemia presenting after mechanical fall.  The patient was outside checking on her kerosene oil.  The patient sustained a mechanical fall while stepping off a cinderblock and fell onto her left side.  She tried to stand up but was unable to do that nor bear weight onto her left leg.  She subsequently crawled back to her front porch where she stayed through the night.  The patient denied any loss of consciousness.  She denied any prodromal symptoms.  On the morning of 02/18/2020, the patient was able to get the attention of her neighbor who subsequently contacted EMS to bring the patient to the hospital for further evaluation.    PT Comments    Pt willing participant in therapy.  PT more I with bed mobility today and able to sit on edge of bed x 5 min but unable to participate in transfer.  Pt states she has a headache and just can not do it today but will be willing to try tomorrow.  Pt I with all exercise today but Lt heelslides and ab/adduction islimited to a few inches.    Follow Up Recommendations  SNF     Equipment Recommendations  None recommended by PT    Recommendations for Other Services       Precautions / Restrictions Precautions Precautions: Fall Restrictions Weight Bearing Restrictions: Yes LLE Weight Bearing: Weight bearing as tolerated    Mobility  Bed Mobility Overal bed mobility: Needs Assistance Bed Mobility: Supine to Sit;Sit to Supine     Supine to sit: Min assist Sit to supine: Mod assist   General bed mobility comments: slow, labored transition to seated EOB requiring assist secondary to pain  Transfers Overall transfer level: Needs assistance               General transfer comment: unable at  this time  Ambulation/Gait                 Balance    sitting balance 3-/5                                        Cognition Arousal/Alertness: Awake/alert Behavior During Therapy: WFL for tasks assessed/performed Overall Cognitive Status: Within Functional Limits for tasks assessed                                        Exercises General Exercises - Lower Extremity Ankle Circles/Pumps: 10 reps Quad Sets: 10 reps Long Arc Quad: 5 reps;Both Heel Slides: Both;5 reps Hip ABduction/ADduction: Both;5 reps    General Comments        Pertinent Vitals/Pain  7/10 with activity        Prior Function            PT Goals (current goals can now be found in the care plan section) Acute Rehab PT Goals Patient Stated Goal: to get stronger PT Goal Formulation: With patient Time For Goal Achievement: 03/04/20 Potential to Achieve Goals: Good    Frequency    Min 4X/week     PT Plan  There ex/activity  and gt          End of Session Equipment Utilized During Treatment: Gait belt Activity Tolerance: Patient limited by pain Patient left: in bed;with call bell/phone within reach;with bed alarm set Nurse Communication: Mobility status PT Visit Diagnosis: Unsteadiness on feet (R26.81);Other abnormalities of gait and mobility (R26.89);Muscle weakness (generalized) (M62.81)     Time: [id number] PT Time Calculation (min) (ACUTE ONLY): 29 min  Charges:  $Therapeutic Exercise: 8-22 mins $Therapeutic Activity: 8-22 mins                      Rayetta Humphrey, PT CLT [phone] 02/21/2020, 12:44 PM

## 2020-02-21 NOTE — TOC Progression Note (Signed)
Transition of Care [facility]) - Progression Note    Patient Details  Name: Jenna Long MRN: [id number] Date of Birth: Jul 20, 1934  Transition of Care [facility] CM/SW Contact  Barry Brunner, LCSW Phone Number: 02/21/2020, 4:34 PM  Clinical Narrative:    CSW received bed offer from [facility]. CSW received auth from [facility]. SNF approval # [id number]. Ambulance approval [phone]. CSW not able to send patient to [facility] on 02/21/2020. Patient requires stretcher transport. [facility] not able to transport patient before [facility] closing. [facility] not able to transport patient due no convalescent trucks available for additional support. TOC to follow.    Expected Discharge Plan: Skilled Nursing Facility Barriers to Discharge: Continued Medical Work up  Expected Discharge Plan and Services Expected Discharge Plan: Skilled Nursing Facility       Living arrangements for the past 2 months: Single Family Home                 DME Arranged: N/A DME Agency: NA       HH Arranged: NA HH Agency: NA         Social Determinants of Health (SDOH) Interventions    Readmission Risk Interventions No flowsheet data found.

## 2020-02-21 NOTE — Progress Notes (Signed)
PROGRESS NOTE  Jenna Long [id number] DOB: [birth year] DOA: 02/18/2020 PCP: Sharilyn Sites, MD Brief History:  86 y.o.femalewith medical history ofdiabetes mellitus type 2, hypertension, hyperlipidemia presenting after mechanical fall. The patient was outside checking on her kerosene oil. The patient sustained a mechanical fall while stepping off a cinderblock and fell onto her left side. She tried to stand up but was unable to do that nor bear weight onto her left leg. She subsequently crawled back to her front porch where she stayed through the night. The patient denied any loss of consciousness. She denied any prodromal symptoms. On the morning of 02/18/2020, the patient was able to get the attention of her neighbor who subsequently contacted EMS to bring the patient to the hospital for further evaluation. In the emergency department, the patient was afebrile and hemodynamically stable with oxygen saturation 97% room air. BMP and LFTs were unremarkable. WBC 11.0, hemoglobin 11.8, platelets 246,000. Chest x-ray was negative. EKG was sinus rhythm with no ST-T wave changes. X-ray of the left hip showed an acute impacted left femoral neck fracture. Orthopedics was consulted.She underwent left hip hemiarthroplasty on 02/18/20. On POD#1 she developed a fever of 101.7. Blood cultures were drawn and remain negative at the time of d/c. UA showed significant pyuria, but culture remained pending. As this may be the cause of fever, she was started on empiric cefdinir x 3 days. CXR on 02/18/20 did not show any infiltrates. Subsequently, patient remained afebrile and hemodynamically stable  Assessment/Plan: Acute left femoral neck fracture -Orthopedics consulted -left hip hemiarthroplasty on 02/18/20 -Post op care per ortho -continue DVT prophylaxisx 24 more days after d/c -PT/OT-->SNF -judicious opioids  Essential hypertension -Restart amlodipine and losartan -BP  elevated at this time as the patient has not had her antihypertensive medications  Post op fever -had temp 101.7 on 12/31 -UA--21-50WBC -urine culture--pending -start empiric cefidinir x 3 days -blood culture x 2 -12/30 CXR--no infiltrates -has remained afebrile and hemodynamically stable since initial fever  Diabetes mellitus type 2 -Holding Metformin--restart after d/c -Check hemoglobin A1c--7.6 -NovoLog sliding scale -restart januvia after d/c  Hyperlipidemia -Continue statin-->changed simvastatin to atorvastatin due to interaction with amlodipine\  Hypokalemia/Hypomagnesemia/Hypophosphatemia -replete     Status is: Inpatient  Remains inpatient appropriate because:IV treatments appropriate due to intensity of illness or inability to take PO   Dispo: The patient is from: Home  Anticipated d/c is to: SNF  Anticipated d/c date is: 1 day  Patient currently is medically stable to d/c.  Insurance authorization delay is barrier for discharge        Family Communication: daughter updated at bedside 1/2  Consultants:  ortho  Code Status:  FULL  DVT Prophylaxis:   [place] Lovenox   Procedures: As Listed in Progress Note Above  Antibiotics: Peri-operative cefazolin -cefdinir 02/20/20>>>   Subjective: Patient complains of pain in left hip, but controlled with po opioid.  Denies f/c, cp, sob, n.v.d  Objective: Vitals:   02/20/20 1525 02/20/20 2217 02/21/20 0624 02/21/20 1408  BP: (!) 172/74 (!) 168/76 (!) 149/64 (!) 163/60  Pulse: 96 91 83 92  Resp: 18 16 16 19   Temp: 99.1 F (37.3 C) 98.2 F (36.8 C) 97.6 F (36.4 C) 98.3 F (36.8 C)  TempSrc: Oral     SpO2: 96% 96% 97% 97%  Weight:      Height:        Intake/Output Summary (Last 24 hours) at 02/21/2020 1655 Last  data filed at 02/21/2020 0900 Gross per 24 hour  Intake 240 ml  Output --  Net 240 ml   Weight change:  Exam:   General:  Pt  is alert, follows commands appropriately, not in acute distress  HEENT: No icterus, No thrush, No neck mass, [place]/AT  Cardiovascular: RRR, S1/S2, no rubs, no gallops  Respiratory: bibasilar crackles. No wheeze  Abdomen: Soft/+BS, non tender, non distended, no guarding  Extremities: No edema, No lymphangitis, No petechiae, No rashes, no synovitis   Data Reviewed: I have personally reviewed following labs and imaging studies Basic Metabolic Panel: Recent Labs  Lab 02/18/20 0833 02/19/20 0525 02/20/20 0620  NA 136 133* 134*  K 4.2 3.5 3.2*  CL 102 103 104  CO2 22 23 23   GLUCOSE 372* 267* 221*  BUN 25* 18 14  CREATININE 0.74 0.74 0.58  CALCIUM 9.3 8.3* 8.1*  MG  --   --  1.5*  PHOS  --   --  1.7*   Liver Function Tests: Recent Labs  Lab 02/18/20 0833  AST 23  ALT 18  ALKPHOS 72  BILITOT 0.9  PROT 7.6  ALBUMIN 3.9   No results for input(s): LIPASE, AMYLASE in the last 168 hours. No results for input(s): AMMONIA in the last 168 hours. Coagulation Profile: No results for input(s): INR, PROTIME in the last 168 hours. CBC: Recent Labs  Lab 02/18/20 0833 02/19/20 0525  WBC 11.0* 9.1  NEUTROABS 9.1*  --   HGB 11.8* 9.2*  HCT 34.4* 27.4*  MCV 96.4 99.3  PLT 246 185   Cardiac Enzymes: Recent Labs  Lab 02/18/20 0833  CKTOTAL 567*   BNP: Invalid input(s): POCBNP CBG: Recent Labs  Lab 02/20/20 1608 02/20/20 2218 02/21/20 0722 02/21/20 1120 02/21/20 1635  GLUCAP 219* 195* 193* 244* 280*   HbA1C: No results for input(s): HGBA1C in the last 72 hours. Urine analysis:    Component Value Date/Time   COLORURINE YELLOW 02/19/2020 1646   APPEARANCEUR HAZY (A) 02/19/2020 1646   LABSPEC 1.020 02/19/2020 1646   PHURINE 5.0 02/19/2020 1646   GLUCOSEU >=500 (A) 02/19/2020 1646   HGBUR NEGATIVE 02/19/2020 1646   BILIRUBINUR NEGATIVE 02/19/2020 1646   KETONESUR NEGATIVE 02/19/2020 1646   PROTEINUR 30 (A) 02/19/2020 1646   NITRITE NEGATIVE 02/19/2020 1646    LEUKOCYTESUR SMALL (A) 02/19/2020 1646   Sepsis Labs: @LABRCNTIP (procalcitonin:4,lacticidven:4) ) Recent Results (from the past 240 hour(s))  Resp Panel by RT-PCR (Flu A&B, Covid) Nasopharyngeal Swab     Status: None   Collection Time: 02/18/20  8:46 AM   Specimen: Nasopharyngeal Swab; Nasopharyngeal(NP) swabs in vial transport medium  Result Value Ref Range Status   SARS Coronavirus 2 by RT PCR NEGATIVE NEGATIVE Final    Comment: (NOTE) SARS-CoV-2 target nucleic acids are NOT DETECTED.  The SARS-CoV-2 RNA is generally detectable in upper respiratory specimens during the acute phase of infection. The lowest concentration of SARS-CoV-2 viral copies this assay can detect is 138 copies/mL. A negative result does not preclude SARS-Cov-2 infection and should not be used as the sole basis for treatment or other patient management decisions. A negative result may occur with  improper specimen collection/handling, submission of specimen other than nasopharyngeal swab, presence of viral mutation(s) within the areas targeted by this assay, and inadequate number of viral copies(<138 copies/mL). A negative result must be combined with clinical observations, patient history, and epidemiological information. The expected result is Negative.  Fact Sheet for Patients:  BloggerCourse.comhttps://www.fda.gov/media/152166/download  Fact Sheet for  Healthcare Providers:  IncredibleEmployment.be  This test is no t yet approved or cleared by the [place] and  has been authorized for detection and/or diagnosis of SARS-CoV-2 by FDA under an Emergency Use Authorization (EUA). This EUA will remain  in effect (meaning this test can be used) for the duration of the COVID-19 declaration under Section 564(b)(1) of the Act, 21 U.S.C.section 360bbb-3(b)(1), unless the authorization is terminated  or revoked sooner.       Influenza A by PCR NEGATIVE NEGATIVE Final   Influenza B by PCR NEGATIVE  NEGATIVE Final    Comment: (NOTE) The Xpert Xpress SARS-CoV-2/FLU/RSV plus assay is intended as an aid in the diagnosis of influenza from Nasopharyngeal swab specimens and should not be used as a sole basis for treatment. Nasal washings and aspirates are unacceptable for Xpert Xpress SARS-CoV-2/FLU/RSV testing.  Fact Sheet for Patients: EntrepreneurPulse.com.au  Fact Sheet for Healthcare Providers: IncredibleEmployment.be  This test is not yet approved or cleared by the [place] FDA and has been authorized for detection and/or diagnosis of SARS-CoV-2 by FDA under an Emergency Use Authorization (EUA). This EUA will remain in effect (meaning this test can be used) for the duration of the COVID-19 declaration under Section 564(b)(1) of the Act, 21 U.S.C. section 360bbb-3(b)(1), unless the authorization is terminated or revoked.  Performed at [facility], [address] [place], [place] [address]   Culture, blood (Routine X 2) w Reflex to ID Panel     Status: None (Preliminary result)   Collection Time: 02/19/20  3:03 PM   Specimen: BLOOD LEFT ARM  Result Value Ref Range Status   Specimen Description BLOOD LEFT ARM  Final   Special Requests   Final    BOTTLES DRAWN AEROBIC AND ANAEROBIC Blood Culture adequate volume   Culture   Final    NO GROWTH 2 DAYS Performed at [facility], [address] [place], [place] [address]    Report Status PENDING  Incomplete  Culture, blood (Routine X 2) w Reflex to ID Panel     Status: None (Preliminary result)   Collection Time: 02/19/20  3:12 PM   Specimen: BLOOD LEFT HAND  Result Value Ref Range Status   Specimen Description BLOOD LEFT HAND  Final   Special Requests   Final    BOTTLES DRAWN AEROBIC AND ANAEROBIC Blood Culture adequate volume   Culture   Final    NO GROWTH 2 DAYS Performed at [facility], [address], [place], [place] [address]    Report Status PENDING  Incomplete  Culture,  Urine     Status: Abnormal   Collection Time: 02/19/20  4:46 PM   Specimen: Urine, Clean Catch  Result Value Ref Range Status   Specimen Description   Final    URINE, CLEAN CATCH Performed at [facility], [address] [place], [place] [address]    Special Requests   Final    NONE Performed at [facility], [address], [place], [place] [address]    Culture (A)  Final    <10,000 COLONIES/mL INSIGNIFICANT GROWTH Performed at [facility], [place] [address] [place], [place] [address]    Report Status 02/21/2020 FINAL  Final     Scheduled Meds: . amLODipine  5 mg Oral Daily  . atorvastatin  20 mg Oral QHS  . Chlorhexidine Gluconate Cloth  6 each Topical Daily  . enoxaparin (LOVENOX) injection  40 mg Subcutaneous Q24H  . insulin aspart  0-5 Units Subcutaneous  QHS  . insulin aspart  0-9 Units Subcutaneous TID WC  . losartan  50 mg Oral Daily  . nutrition supplement (JUVEN)  1 packet Oral BID BM  . phosphorus  500 mg Oral BID  . Ensure Max Protein  11 oz Oral Daily   Continuous Infusions:  Procedures/Studies: DG Chest Port 1 View  Result Date: 02/18/2020 CLINICAL DATA:  86 year old female status post fall last night, found down by neighbor this morning. Left hip fracture. EXAM: PORTABLE CHEST 1 VIEW COMPARISON:  None. FINDINGS: Portable AP semi upright view at 0810 hours. Somewhat low lung volumes. Mediastinal contours within normal limits. Visualized tracheal air column is within normal limits. Allowing for portable technique the lungs are clear. Negative visible bowel gas pattern. No acute osseous abnormality identified. IMPRESSION: No acute cardiopulmonary abnormality. Electronically Signed   By: Odessa Fleming M.D.   On: 02/18/2020 08:22   DG HIP UNILAT WITH PELVIS 2-3 VIEWS LEFT  Result Date: 02/18/2020 CLINICAL DATA:  Left hip fracture status post arthroplasty EXAM: DG HIP (WITH OR WITHOUT PELVIS) 2-3V LEFT COMPARISON:  02/18/2020 FINDINGS: Frontal view of the pelvis as  well as frontal and frogleg lateral views of the left hip demonstrate left hip hemiarthroplasty in the expected position without signs of acute complication. Postsurgical changes are seen within the overlying soft tissues. The remainder of the bony pelvis is unremarkable. IMPRESSION: 1. Unremarkable left hip hemiarthroplasty. Electronically Signed   By: Sharlet Salina M.D.   On: 02/18/2020 18:03   DG Hip Unilat With Pelvis 2-3 Views Left  Result Date: 02/18/2020 CLINICAL DATA:  86 year old female status post fall last night, found down by neighbor this morning. Left hip pain. EXAM: DG HIP (WITH OR WITHOUT PELVIS) 2-3V LEFT COMPARISON:  None. FINDINGS: Portable views of the pelvis and left hip. Impacted fracture of the left femoral neck. The intertrochanteric segment appears to remain intact. Left femoral head normally located. No superimposed pelvis fracture. Grossly intact proximal right femur. Bone mineralization is within normal limits for age. Negative visible bowel gas pattern. IMPRESSION: Acute impacted fracture of the left femoral neck. Electronically Signed   By: Odessa Fleming M.D.   On: 02/18/2020 08:21    Catarina Hartshorn, DO  [facility]  If 7PM-7AM, please contact night-coverage [email or web address] Password [email or web address] 02/21/2020, 4:55 PM   LOS: 3 days

## 2020-02-22 ENCOUNTER — Telehealth: Payer: Self-pay | Admitting: Orthopedic Surgery

## 2020-02-22 DIAGNOSIS — E559 Vitamin D deficiency, unspecified: Secondary | ICD-10-CM | POA: Diagnosis not present

## 2020-02-22 DIAGNOSIS — W010XXA Fall on same level from slipping, tripping and stumbling without subsequent striking against object, initial encounter: Secondary | ICD-10-CM | POA: Diagnosis not present

## 2020-02-22 DIAGNOSIS — M6281 Muscle weakness (generalized): Secondary | ICD-10-CM | POA: Diagnosis not present

## 2020-02-22 DIAGNOSIS — D649 Anemia, unspecified: Secondary | ICD-10-CM | POA: Diagnosis not present

## 2020-02-22 DIAGNOSIS — R262 Difficulty in walking, not elsewhere classified: Secondary | ICD-10-CM | POA: Diagnosis not present

## 2020-02-22 DIAGNOSIS — S72002A Fracture of unspecified part of neck of left femur, initial encounter for closed fracture: Secondary | ICD-10-CM | POA: Diagnosis not present

## 2020-02-22 DIAGNOSIS — I10 Essential (primary) hypertension: Secondary | ICD-10-CM | POA: Diagnosis not present

## 2020-02-22 DIAGNOSIS — W19XXXA Unspecified fall, initial encounter: Secondary | ICD-10-CM | POA: Diagnosis not present

## 2020-02-22 DIAGNOSIS — E118 Type 2 diabetes mellitus with unspecified complications: Secondary | ICD-10-CM | POA: Diagnosis not present

## 2020-02-22 DIAGNOSIS — Z7401 Bed confinement status: Secondary | ICD-10-CM | POA: Diagnosis not present

## 2020-02-22 DIAGNOSIS — Y92017 Garden or yard in single-family (private) house as the place of occurrence of the external cause: Secondary | ICD-10-CM | POA: Diagnosis not present

## 2020-02-22 DIAGNOSIS — E785 Hyperlipidemia, unspecified: Secondary | ICD-10-CM | POA: Diagnosis not present

## 2020-02-22 DIAGNOSIS — S72002D Fracture of unspecified part of neck of left femur, subsequent encounter for closed fracture with routine healing: Secondary | ICD-10-CM | POA: Diagnosis not present

## 2020-02-22 DIAGNOSIS — Z9181 History of falling: Secondary | ICD-10-CM | POA: Diagnosis not present

## 2020-02-22 DIAGNOSIS — Z96642 Presence of left artificial hip joint: Secondary | ICD-10-CM | POA: Diagnosis not present

## 2020-02-22 DIAGNOSIS — W19XXXD Unspecified fall, subsequent encounter: Secondary | ICD-10-CM | POA: Diagnosis not present

## 2020-02-22 DIAGNOSIS — E119 Type 2 diabetes mellitus without complications: Secondary | ICD-10-CM | POA: Diagnosis not present

## 2020-02-22 DIAGNOSIS — S728X2D Other fracture of left femur, subsequent encounter for closed fracture with routine healing: Secondary | ICD-10-CM | POA: Diagnosis not present

## 2020-02-22 DIAGNOSIS — I959 Hypotension, unspecified: Secondary | ICD-10-CM | POA: Diagnosis not present

## 2020-02-22 DIAGNOSIS — F29 Unspecified psychosis not due to a substance or known physiological condition: Secondary | ICD-10-CM | POA: Diagnosis not present

## 2020-02-22 DIAGNOSIS — S72002G Fracture of unspecified part of neck of left femur, subsequent encounter for closed fracture with delayed healing: Secondary | ICD-10-CM | POA: Diagnosis not present

## 2020-02-22 DIAGNOSIS — E876 Hypokalemia: Secondary | ICD-10-CM | POA: Diagnosis not present

## 2020-02-22 DIAGNOSIS — E782 Mixed hyperlipidemia: Secondary | ICD-10-CM | POA: Diagnosis not present

## 2020-02-22 DIAGNOSIS — Z4732 Aftercare following explantation of hip joint prosthesis: Secondary | ICD-10-CM | POA: Diagnosis not present

## 2020-02-22 DIAGNOSIS — Z23 Encounter for immunization: Secondary | ICD-10-CM | POA: Diagnosis not present

## 2020-02-22 DIAGNOSIS — E7800 Pure hypercholesterolemia, unspecified: Secondary | ICD-10-CM | POA: Diagnosis not present

## 2020-02-22 DIAGNOSIS — Z7984 Long term (current) use of oral hypoglycemic drugs: Secondary | ICD-10-CM | POA: Diagnosis not present

## 2020-02-22 LAB — CBC
HCT: 24.9 % — ABNORMAL LOW (ref 36.0–46.0)
Hemoglobin: 8.5 g/dL — ABNORMAL LOW (ref 12.0–15.0)
MCH: 33.7 pg (ref 26.0–34.0)
MCHC: 34.1 g/dL (ref 30.0–36.0)
MCV: 98.8 fL (ref 80.0–100.0)
Platelets: 215 10*3/uL (ref 150–400)
RBC: 2.52 MIL/uL — ABNORMAL LOW (ref 3.87–5.11)
RDW: 12.4 % (ref 11.5–15.5)
WBC: 6.2 10*3/uL (ref 4.0–10.5)
nRBC: 0 % (ref 0.0–0.2)

## 2020-02-22 LAB — BASIC METABOLIC PANEL
Anion gap: 9 (ref 5–15)
BUN: 21 mg/dL (ref 8–23)
CO2: 26 mmol/L (ref 22–32)
Calcium: 8 mg/dL — ABNORMAL LOW (ref 8.9–10.3)
Chloride: 102 mmol/L (ref 98–111)
Creatinine, Ser: 0.69 mg/dL (ref 0.44–1.00)
GFR, Estimated: >60 mL/min (ref >60)
Glucose, Bld: 225 mg/dL — ABNORMAL HIGH (ref 70–99)
Potassium: 2.9 mmol/L — ABNORMAL LOW (ref 3.5–5.1)
Sodium: 137 mmol/L (ref 135–145)

## 2020-02-22 LAB — PHOSPHORUS: Phosphorus: 3.2 mg/dL (ref 2.5–4.6)

## 2020-02-22 LAB — RESP PANEL BY RT-PCR (FLU A&B, COVID) ARPGX2
Influenza A by PCR: NEGATIVE
Influenza B by PCR: NEGATIVE
SARS Coronavirus 2 by RT PCR: NEGATIVE

## 2020-02-22 LAB — GLUCOSE, CAPILLARY
Glucose-Capillary: 213 mg/dL — ABNORMAL HIGH (ref 70–99)
Glucose-Capillary: 227 mg/dL — ABNORMAL HIGH (ref 70–99)

## 2020-02-22 LAB — MAGNESIUM: Magnesium: 1.8 mg/dL (ref 1.7–2.4)

## 2020-02-22 MED ORDER — POTASSIUM CHLORIDE CRYS ER 20 MEQ PO TBCR
40.0000 meq | EXTENDED_RELEASE_TABLET | Freq: Once | ORAL | Status: AC
  Administered 2020-02-22: 40 meq via ORAL

## 2020-02-22 MED ORDER — POTASSIUM CHLORIDE CRYS ER 20 MEQ PO TBCR
40.0000 meq | EXTENDED_RELEASE_TABLET | Freq: Once | ORAL | Status: DC
  Filled 2020-02-22: qty 2

## 2020-02-22 NOTE — Care Management Important Message (Signed)
Important Message  Patient Details  Name: Jenna Long MRN: [id number] Date of Birth: 09/13/1934   Medicare Important Message Given:  Yes     Corey Harold 02/22/2020, 1:43 PM

## 2020-02-22 NOTE — Progress Notes (Signed)
Physical Therapy Treatment Patient Details Name: Jenna Long MRN: [id number] DOB: 1935/01/04 Today's Date: 02/22/2020    History of Present Illness Jenna Long is a 86 y.o. female with medical history of diabetes mellitus type 2, hypertension, hyperlipidemia presenting after mechanical fall.  The patient was outside checking on her kerosene oil.  The patient sustained a mechanical fall while stepping off a cinderblock and fell onto her left side.  She tried to stand up but was unable to do that nor bear weight onto her left leg.  She subsequently crawled back to her front porch where she stayed through the night.  The patient denied any loss of consciousness.  She denied any prodromal symptoms.  On the morning of 02/18/2020, the patient was able to get the attention of her neighbor who subsequently contacted EMS to bring the patient to the hospital for further evaluation.    PT Comments    Patient with hip abduction foam upon entrance for session. Patient completes supine exercises bilaterally but requires assist with heel slides/ knee flexion secondary to pain/weakness. She requires assist for LE movement and uprighting trunk to transition to seated EOB. She demonstrates good sitting tolerance and balance while seated EOB. Attempted to transfer to standing x 6 with extensive assist, verbal cueing and RW use with first 5 attempts failed. Patient able to transfer to standing on trial 6 with extensive assist but is limited to about 10 seconds of standing. Patient began to feel nauseous at end of session and she is assisted back into bed at end of session. Patient will benefit from continued physical therapy in hospital and recommended venue below to increase strength, balance, endurance for safe ADLs and gait.   Follow Up Recommendations  SNF     Equipment Recommendations  None recommended by PT    Recommendations for Other Services       Precautions / Restrictions Precautions Precautions:  Fall;Posterior Hip Precaution Comments: abductor pillow on when not working with therapy. Restrictions Weight Bearing Restrictions: Yes LLE Weight Bearing: Weight bearing as tolerated    Mobility  Bed Mobility Overal bed mobility: Needs Assistance Bed Mobility: Supine to Sit;Sit to Supine     Supine to sit: Min assist Sit to supine: Mod assist   General bed mobility comments: slow, labored transition to seated EOB requiring assist secondary to pain for LLE movement and uprighting trunk  Transfers Overall transfer level: Needs assistance Equipment used: Rolling walker (2 wheeled) Transfers: Sit to/from Stand Sit to Stand: Max assist;Total assist;From elevated surface         General transfer comment: attempt to transfer to standing x 5 that were unsuccessful, able to transfer to standing on 6th attempt with extensive assist and verbal cueing for sequencing with use of RW, patient limited to about 10 seconds of standing with support and RW use  Ambulation/Gait                 Stairs             Wheelchair Mobility    Modified Rankin (Stroke Patients Only)       Balance                                            Cognition Arousal/Alertness: Awake/alert Behavior During Therapy: WFL for tasks assessed/performed Overall Cognitive Status: Within Functional Limits for tasks assessed  Exercises General Exercises - Lower Extremity Ankle Circles/Pumps: AROM;Both;10 reps;Supine Quad Sets: AROM;Both;10 reps;Supine Long Arc Quad: AROM;Both;10 reps;Seated Heel Slides: AROM;AAROM;Both;10 reps;Supine    General Comments        Pertinent Vitals/Pain Pain Assessment: No/denies pain (at rest)    Home Living                      Prior Function            PT Goals (current goals can now be found in the care plan section) Acute Rehab PT Goals Patient Stated Goal: to get  stronger PT Goal Formulation: With patient Time For Goal Achievement: 03/04/20 Potential to Achieve Goals: Good Progress towards PT goals: Progressing toward goals    Frequency    Min 4X/week      PT Plan Current plan remains appropriate    Co-evaluation              AM-PAC PT "6 Clicks" Mobility   Outcome Measure  Help needed turning from your back to your side while in a flat bed without using bedrails?: A Little Help needed moving from lying on your back to sitting on the side of a flat bed without using bedrails?: A Lot Help needed moving to and from a bed to a chair (including a wheelchair)?: Total Help needed standing up from a chair using your arms (e.g., wheelchair or bedside chair)?: Total Help needed to walk in hospital room?: Total Help needed climbing 3-5 steps with a railing? : Total 6 Click Score: 9    End of Session Equipment Utilized During Treatment: Gait belt Activity Tolerance: Patient limited by pain Patient left: in bed;with call bell/phone within reach;with bed alarm set Nurse Communication: Mobility status PT Visit Diagnosis: Unsteadiness on feet (R26.81);Other abnormalities of gait and mobility (R26.89);Muscle weakness (generalized) (M62.81)     Time: [id number] PT Time Calculation (min) (ACUTE ONLY): 24 min  Charges:  $Therapeutic Exercise: 8-22 mins $Therapeutic Activity: 8-22 mins                     11:26 AM, 02/22/20 Mearl Latin PT, DPT Physical Therapist at [facility]

## 2020-02-22 NOTE — Telephone Encounter (Signed)
Patient will need a follow up around 03/02/2020 or 03/04/2020. I tried to call but I was not able to leave a message.

## 2020-02-22 NOTE — Telephone Encounter (Signed)
-----   Message from Oliver Barre, MD sent at 02/19/2020 11:40 AM EST ----- Jenna Long  Please have this patient scheduled for a follow up in approximately 2 weeks for staple removal and XR for left hip fracture.    Thanks   Mark A. Dallas Schimke, MD MS [facility] [address] [place],  [place]  [address] Phone: [phone] Fax: [phone]

## 2020-02-22 NOTE — Progress Notes (Signed)
Report called to  [place] at [facility].  EMS transport on unit to take patient.

## 2020-02-22 NOTE — Progress Notes (Signed)
Patient daughter, Lupita Leash- attempted x2 at number [phone] to update regarding patients discharge to [facility]. Number not in service; attempted to call patient home number with no answer.

## 2020-02-22 NOTE — TOC Transition Note (Signed)
Transition of Care [facility]) - CM/SW Discharge Note   Patient Details  Name: Jenna Long MRN: [id number] Date of Birth: 12-04-1934  Transition of Care [facility]) CM/SW Contact:  Barry Brunner, LCSW Phone Number: 02/22/2020, 1:11 PM   Clinical Narrative:    Patient's discharge delayed due to [facility] and [facility] not able to provide transport on 02/21/2020. New Covid test needed for discharge to [facility] on 01/03. CSW requested updated Covid test. Revonda Standard with [facility] reported that Revonda Standard they needed previous Covid vaccination cards and that the Booster documentation was not sufficient for admission. Patient reported that she received her vaccination with [facility] drug. CSW contacted pharmacy for vaccination records. [facility] reported that the patient had received [place] vaccination on 04/12 - 04/28. Revonda Standard reported that [facility] will be faxing documentation to [facility]. Revonda Standard with [facility] also reported that she was not identify patient's PASSR. CSW sent record of patient's PASSR received on 02/19/2020. Revonda Standard reported that she is able to take patient on 02/22/2020. CSW contacted EMS for transport to [facility] and printed Med necessity. TOC signing off.   Final next level of care: Skilled Nursing Facility Barriers to Discharge: Continued Medical Work up   Patient Goals and CMS Choice Patient states their goals for this hospitalization and ongoing recovery are:: Rehab with SNF CMS Medicare.gov Compare Post Acute Care list provided to:: Patient Choice offered to / list presented to : Patient  Discharge Placement                       Discharge Plan and Services                DME Arranged: N/A DME Agency: NA       HH Arranged: NA HH Agency: NA        Social Determinants of Health (SDOH) Interventions     Readmission Risk Interventions No flowsheet data found.

## 2020-02-22 NOTE — Progress Notes (Signed)
   ORTHOPAEDIC PROGRESS NOTE  s/p Procedure(s): Left hip hemiarthroplasty for fracture  DOS: 02/18/20  SUBJECTIVE: Left hip pain improving.  No complaints at this time.  She is expecting to go to a nursing facility today.   OBJECTIVE: PE:  Vitals:   02/22/20 0516 02/22/20 1249  BP: (!) 134/55 (!) 140/59  Pulse: 74 86  Resp: 20 18  Temp: 98 F (36.7 C) 98.3 F (36.8 C)  SpO2: 95% 96%   Alert and oriented, no acute distress  Left hip dressing intact.  Small amount of strikethrough. Hip abduction pillow in place Active motion left TA/EHL/GS 2+ DP pulse Sensation intact to left foot, all nerve distributions.   XR of the left hip demonstrates reduced left hip with adverse features  ASSESSMENT: Jenna Long is a 87 y.o. female doing well postoperatively.  PLAN: Weightbearing: WBAT LLE Insicional and dressing care: Dressings left intact until follow-up Orthopedic device(s): Hip abduction pillow in place at all times, unless working with PT/OT.  Posterior hip precautions VTE prophylaxis: Discretion of primary team, no orthopaedic contraindications.  Ok to resume POD#1   Pain control: Multimodal; limit use of narcotics Follow - up plan: 2 weeks; if going to SNF and they are comfortable removing the staples, she does not need to return for a 2 week follow up.  If staples removed in SNF, can return for f/u 6 weeks postop.  Contact information:     Kaytlynn Kochan A. Dallas Schimke, MD MS [facility] [address] [place],  [place]  [address] Phone: [phone] Fax: [phone]

## 2020-02-23 ENCOUNTER — Encounter (HOSPITAL_COMMUNITY): Payer: Self-pay | Admitting: Orthopedic Surgery

## 2020-02-23 DIAGNOSIS — E876 Hypokalemia: Secondary | ICD-10-CM | POA: Diagnosis not present

## 2020-02-23 DIAGNOSIS — D649 Anemia, unspecified: Secondary | ICD-10-CM | POA: Diagnosis not present

## 2020-02-23 DIAGNOSIS — I10 Essential (primary) hypertension: Secondary | ICD-10-CM | POA: Diagnosis not present

## 2020-02-23 DIAGNOSIS — E785 Hyperlipidemia, unspecified: Secondary | ICD-10-CM | POA: Diagnosis not present

## 2020-02-23 DIAGNOSIS — S728X2D Other fracture of left femur, subsequent encounter for closed fracture with routine healing: Secondary | ICD-10-CM | POA: Diagnosis not present

## 2020-02-23 DIAGNOSIS — E119 Type 2 diabetes mellitus without complications: Secondary | ICD-10-CM | POA: Diagnosis not present

## 2020-02-23 NOTE — Telephone Encounter (Signed)
I called and spoke with Physical Therapist Alycia Rossetti and gave the recommendations per Dr. Dallas Schimke. No other concerns.

## 2020-02-23 NOTE — Telephone Encounter (Signed)
Thank you   The patient can be weight bearing as tolerated on the left leg.  Please maintain posterior hip precautions at all times.  Hip abduction pillow at all times in bed.    Lanecia Sliva A. Dallas Schimke, MD MS [facility] [address] [place],  [place]  [address] Phone: [phone] Fax: [phone]

## 2020-02-23 NOTE — Telephone Encounter (Signed)
*  Update* Patient is at [facility] at this time. Call received from physical therapist Coral Else - ph#[phone]  - requests weight-bearing status. (we will call back to scheduler to make the appointment)

## 2020-02-23 NOTE — Telephone Encounter (Signed)
Please advise 

## 2020-02-24 LAB — CULTURE, BLOOD (ROUTINE X 2)
Culture: NO GROWTH
Culture: NO GROWTH
Special Requests: ADEQUATE
Special Requests: ADEQUATE

## 2020-02-26 DIAGNOSIS — S728X2D Other fracture of left femur, subsequent encounter for closed fracture with routine healing: Secondary | ICD-10-CM | POA: Diagnosis not present

## 2020-02-26 DIAGNOSIS — I10 Essential (primary) hypertension: Secondary | ICD-10-CM | POA: Diagnosis not present

## 2020-02-26 DIAGNOSIS — E119 Type 2 diabetes mellitus without complications: Secondary | ICD-10-CM | POA: Diagnosis not present

## 2020-02-29 DIAGNOSIS — I10 Essential (primary) hypertension: Secondary | ICD-10-CM | POA: Diagnosis not present

## 2020-02-29 DIAGNOSIS — E119 Type 2 diabetes mellitus without complications: Secondary | ICD-10-CM | POA: Diagnosis not present

## 2020-02-29 DIAGNOSIS — E559 Vitamin D deficiency, unspecified: Secondary | ICD-10-CM | POA: Diagnosis not present

## 2020-03-04 ENCOUNTER — Other Ambulatory Visit: Payer: Self-pay

## 2020-03-04 ENCOUNTER — Encounter: Payer: Self-pay | Admitting: Orthopedic Surgery

## 2020-03-04 ENCOUNTER — Ambulatory Visit: Payer: PPO

## 2020-03-04 ENCOUNTER — Ambulatory Visit (INDEPENDENT_AMBULATORY_CARE_PROVIDER_SITE_OTHER): Payer: PPO | Admitting: Orthopedic Surgery

## 2020-03-04 VITALS — BP 149/63 | HR 87

## 2020-03-04 DIAGNOSIS — S72002A Fracture of unspecified part of neck of left femur, initial encounter for closed fracture: Secondary | ICD-10-CM | POA: Diagnosis not present

## 2020-03-04 DIAGNOSIS — I10 Essential (primary) hypertension: Secondary | ICD-10-CM | POA: Diagnosis not present

## 2020-03-04 DIAGNOSIS — Z96642 Presence of left artificial hip joint: Secondary | ICD-10-CM

## 2020-03-04 DIAGNOSIS — S728X2D Other fracture of left femur, subsequent encounter for closed fracture with routine healing: Secondary | ICD-10-CM | POA: Diagnosis not present

## 2020-03-04 DIAGNOSIS — E119 Type 2 diabetes mellitus without complications: Secondary | ICD-10-CM | POA: Diagnosis not present

## 2020-03-04 NOTE — Patient Instructions (Signed)
Dressing and staples removed XR reviewed, implant in good position Continue hip abduction pillow while in bed Continue posterior hip precautions.  Continue DVT prophylaxis WBAT LLE Follow up in 4 weeks.

## 2020-03-04 NOTE — Progress Notes (Signed)
Orthopaedic Postop Note  Assessment: Jenna Long is a 86 y.o. female s/p left hip hemiarthroplasty  DOS: 02/18/2020  Plan: Dressing and staples removed XR reviewed, implant in good position Continue hip abduction pillow while in bed Continue posterior hip precautions.  Continue DVT prophylaxis WBAT LLE Follow up in 4 weeks.    Follow-up: Return in about 4 weeks (around 04/01/2020). XR at next visit: Left hip   Subjective:  Chief Complaint  Patient presents with  . Hip Injury    DOS 02/18/2020, Patient reports doing ok today,    History of Present Illness: Jenna Long is a 86 y.o. female who presents following the above stated procedure.  She is now approximately 2 weeks out and doing well.  She was discharged to a nursing facility and remains admitted today.  Pain is well controlled.  She is hoping to be able to return home in the near future, but is concerned that she is going to spend a long time in the nursing facility.  Review of Systems: No fevers or chills No numbness or tingling No Chest Pain No shortness of breath   Objective: BP (!) 149/63   Pulse 87   Physical Exam: Lateral hip incision is healing well.  No surrounding erythema or drainage Tolerates gentle left hip range of motion Sensation intact to left foot Active motion TA/EHL/GA  IMAGING: I personally ordered and reviewed the following images:  XR of the left hip demonstrates stable appearence of partial hip arthroplasty.  No evidence of hardware failure or subsidence.  No acute injury or dislocation   Impression: Stable left hip hemiarthroplasty   Mordecai Rasmussen, MD 03/04/2020 12:53 PM

## 2020-03-11 DIAGNOSIS — I10 Essential (primary) hypertension: Secondary | ICD-10-CM | POA: Diagnosis not present

## 2020-03-11 DIAGNOSIS — S728X2D Other fracture of left femur, subsequent encounter for closed fracture with routine healing: Secondary | ICD-10-CM | POA: Diagnosis not present

## 2020-03-11 DIAGNOSIS — E119 Type 2 diabetes mellitus without complications: Secondary | ICD-10-CM | POA: Diagnosis not present

## 2020-03-16 DIAGNOSIS — S728X2D Other fracture of left femur, subsequent encounter for closed fracture with routine healing: Secondary | ICD-10-CM | POA: Diagnosis not present

## 2020-03-16 DIAGNOSIS — E119 Type 2 diabetes mellitus without complications: Secondary | ICD-10-CM | POA: Diagnosis not present

## 2020-03-16 DIAGNOSIS — I10 Essential (primary) hypertension: Secondary | ICD-10-CM | POA: Diagnosis not present

## 2020-03-17 DIAGNOSIS — S72002D Fracture of unspecified part of neck of left femur, subsequent encounter for closed fracture with routine healing: Secondary | ICD-10-CM | POA: Diagnosis not present

## 2020-03-17 DIAGNOSIS — E785 Hyperlipidemia, unspecified: Secondary | ICD-10-CM | POA: Diagnosis not present

## 2020-03-17 DIAGNOSIS — E119 Type 2 diabetes mellitus without complications: Secondary | ICD-10-CM | POA: Diagnosis not present

## 2020-03-17 DIAGNOSIS — Z7984 Long term (current) use of oral hypoglycemic drugs: Secondary | ICD-10-CM | POA: Diagnosis not present

## 2020-03-17 DIAGNOSIS — D649 Anemia, unspecified: Secondary | ICD-10-CM | POA: Diagnosis not present

## 2020-03-17 DIAGNOSIS — Z96642 Presence of left artificial hip joint: Secondary | ICD-10-CM | POA: Diagnosis not present

## 2020-03-17 DIAGNOSIS — E7800 Pure hypercholesterolemia, unspecified: Secondary | ICD-10-CM | POA: Diagnosis not present

## 2020-03-17 DIAGNOSIS — I10 Essential (primary) hypertension: Secondary | ICD-10-CM | POA: Diagnosis not present

## 2020-03-17 DIAGNOSIS — W19XXXD Unspecified fall, subsequent encounter: Secondary | ICD-10-CM | POA: Diagnosis not present

## 2020-03-17 DIAGNOSIS — Z9181 History of falling: Secondary | ICD-10-CM | POA: Diagnosis not present

## 2020-03-19 DIAGNOSIS — I10 Essential (primary) hypertension: Secondary | ICD-10-CM | POA: Diagnosis not present

## 2020-03-19 DIAGNOSIS — E1165 Type 2 diabetes mellitus with hyperglycemia: Secondary | ICD-10-CM | POA: Diagnosis not present

## 2020-03-19 DIAGNOSIS — E7849 Other hyperlipidemia: Secondary | ICD-10-CM | POA: Diagnosis not present

## 2020-03-21 DIAGNOSIS — I10 Essential (primary) hypertension: Secondary | ICD-10-CM | POA: Diagnosis not present

## 2020-03-21 DIAGNOSIS — Z6824 Body mass index (BMI) 24.0-24.9, adult: Secondary | ICD-10-CM | POA: Diagnosis not present

## 2020-03-21 DIAGNOSIS — E119 Type 2 diabetes mellitus without complications: Secondary | ICD-10-CM | POA: Diagnosis not present

## 2020-03-21 DIAGNOSIS — M1991 Primary osteoarthritis, unspecified site: Secondary | ICD-10-CM | POA: Diagnosis not present

## 2020-03-21 DIAGNOSIS — Z1331 Encounter for screening for depression: Secondary | ICD-10-CM | POA: Diagnosis not present

## 2020-03-21 DIAGNOSIS — Z0001 Encounter for general adult medical examination with abnormal findings: Secondary | ICD-10-CM | POA: Diagnosis not present

## 2020-03-22 DIAGNOSIS — S72002D Fracture of unspecified part of neck of left femur, subsequent encounter for closed fracture with routine healing: Secondary | ICD-10-CM | POA: Diagnosis not present

## 2020-03-22 DIAGNOSIS — I10 Essential (primary) hypertension: Secondary | ICD-10-CM | POA: Diagnosis not present

## 2020-03-22 DIAGNOSIS — Z9181 History of falling: Secondary | ICD-10-CM | POA: Diagnosis not present

## 2020-03-22 DIAGNOSIS — D649 Anemia, unspecified: Secondary | ICD-10-CM | POA: Diagnosis not present

## 2020-03-22 DIAGNOSIS — W19XXXD Unspecified fall, subsequent encounter: Secondary | ICD-10-CM | POA: Diagnosis not present

## 2020-03-22 DIAGNOSIS — E7800 Pure hypercholesterolemia, unspecified: Secondary | ICD-10-CM | POA: Diagnosis not present

## 2020-03-22 DIAGNOSIS — Z96642 Presence of left artificial hip joint: Secondary | ICD-10-CM | POA: Diagnosis not present

## 2020-03-22 DIAGNOSIS — E785 Hyperlipidemia, unspecified: Secondary | ICD-10-CM | POA: Diagnosis not present

## 2020-03-22 DIAGNOSIS — E119 Type 2 diabetes mellitus without complications: Secondary | ICD-10-CM | POA: Diagnosis not present

## 2020-03-22 DIAGNOSIS — Z7984 Long term (current) use of oral hypoglycemic drugs: Secondary | ICD-10-CM | POA: Diagnosis not present

## 2020-04-01 ENCOUNTER — Ambulatory Visit (INDEPENDENT_AMBULATORY_CARE_PROVIDER_SITE_OTHER): Payer: PPO | Admitting: Orthopedic Surgery

## 2020-04-01 ENCOUNTER — Ambulatory Visit: Payer: PPO

## 2020-04-01 ENCOUNTER — Other Ambulatory Visit: Payer: Self-pay

## 2020-04-01 ENCOUNTER — Encounter: Payer: Self-pay | Admitting: Orthopedic Surgery

## 2020-04-01 DIAGNOSIS — S72002D Fracture of unspecified part of neck of left femur, subsequent encounter for closed fracture with routine healing: Secondary | ICD-10-CM | POA: Diagnosis not present

## 2020-04-01 DIAGNOSIS — Z96642 Presence of left artificial hip joint: Secondary | ICD-10-CM

## 2020-04-01 DIAGNOSIS — S72002A Fracture of unspecified part of neck of left femur, initial encounter for closed fracture: Secondary | ICD-10-CM

## 2020-04-01 NOTE — Progress Notes (Signed)
Orthopaedic Postop Note  Assessment: Jenna Long is a 86 y.o. female s/p left hip hemiarthroplasty  DOS: 02/18/2020  Plan: Continue using walker as needed Continue PT  Walking is excellent exercise WBAT LLE Continue posterior hip precautions for another 6 weeks Follow up in 6 weeks.    Follow-up: Return in about 6 weeks (around 05/13/2020). XR at next visit: Left hip   Subjective:  Chief Complaint  Patient presents with  . Hip Injury    DOS 02/18/20. Pt reports she is doing well,     History of Present Illness: Jenna Long is a 86 y.o. female who presents following the above stated procedure.  She continues to improve.  She is now at home.  She has a family member coming to stay with her for a few hours per day.  She also has home health coming to work with her a few times per week.  According to the patient, and her daughter, therapy will likely stop within the next week or so.  She is ambulating well within her house using a walker.  She takes Tylenol only occasionally for pain.  She continues to follow posterior hip precautions.  Review of Systems: No fevers or chills No numbness or tingling No Chest Pain No shortness of breath   Objective: There were no vitals taken for this visit.  Physical Exam:  Alert and oriented, no acute distress Ambulates well using a walker  Lateral hip incision is healing well, without surrounding erythema or drainage. Active motion intact in the TA/EHL Sensation is intact throughout the left foot. She tolerates gentle range of motion of the left hip.  No pain with axial loading.  IMAGING: I personally ordered and reviewed the following images:   X-ray of the left hip demonstrates excellent position of the hemiarthroplasty.  There is been no interval subsidence.  No acute fractures.  There does appear to be some heterotopic ossification forming.  Impression: Left hip hemiarthroplasty in excellent position.   Jenna Rasmussen,  MD 04/01/2020 8:42 AM

## 2020-04-04 DIAGNOSIS — E785 Hyperlipidemia, unspecified: Secondary | ICD-10-CM | POA: Diagnosis not present

## 2020-04-04 DIAGNOSIS — I10 Essential (primary) hypertension: Secondary | ICD-10-CM | POA: Diagnosis not present

## 2020-04-04 DIAGNOSIS — E119 Type 2 diabetes mellitus without complications: Secondary | ICD-10-CM | POA: Diagnosis not present

## 2020-04-04 DIAGNOSIS — S72002D Fracture of unspecified part of neck of left femur, subsequent encounter for closed fracture with routine healing: Secondary | ICD-10-CM | POA: Diagnosis not present

## 2020-05-13 ENCOUNTER — Ambulatory Visit: Payer: PPO

## 2020-05-13 ENCOUNTER — Ambulatory Visit (INDEPENDENT_AMBULATORY_CARE_PROVIDER_SITE_OTHER): Payer: PPO | Admitting: Orthopedic Surgery

## 2020-05-13 ENCOUNTER — Other Ambulatory Visit: Payer: Self-pay

## 2020-05-13 ENCOUNTER — Encounter: Payer: Self-pay | Admitting: Orthopedic Surgery

## 2020-05-13 VITALS — BP 168/90 | HR 75 | Ht 67.0 in

## 2020-05-13 DIAGNOSIS — S72002D Fracture of unspecified part of neck of left femur, subsequent encounter for closed fracture with routine healing: Secondary | ICD-10-CM

## 2020-05-13 DIAGNOSIS — S72002A Fracture of unspecified part of neck of left femur, initial encounter for closed fracture: Secondary | ICD-10-CM

## 2020-05-13 NOTE — Progress Notes (Signed)
Orthopaedic Postop Note  Assessment: Jenna Long is a 86 y.o. female s/p left hip hemiarthroplasty  DOS: 02/18/2020  Plan: Continue using cane as needed Walking is excellent exercise WBAT LLE Continue to be careful with deep flexion of the left hip, but no longer need to follow posterior hip precautions.  Follow up in 6 weeks.    Follow-up: Return in about 9 months (around 02/12/2021). XR at next visit: Left hip   Subjective:  Chief Complaint  Patient presents with  . Hip Injury    Left hip follow up. Post-op 02/18/20.    History of Present Illness: Jenna Long is a 86 y.o. female who presents following the above stated procedure.  She is doing well overall.  She had a fall in her home recently, but did not hurt anything.  She is not having any pain in her hip.  She is using a cane to assist with ambulation.  No fevers or chills.   Review of Systems: No fevers or chills No numbness or tingling No Chest Pain No shortness of breath   Objective: BP (!) 168/90   Pulse 75   Ht 5\' 7"  (1.702 m)   BMI 23.65 kg/m   Physical Exam:  Alert and oriented, no acute distress Ambulates well using a cane  Lateral hip incision is healing well, without surrounding erythema or drainage.  There is a small area of tenderness over the distal aspect of the incision. Active motion intact in the TA/EHL Sensation is intact throughout the left foot. She tolerates gentle range of motion of the left hip.  No pain with axial loading.  IMAGING: I personally ordered and reviewed the following images:  XR of the left hip demonstrates a left hip hemiarthroplasty in good position. There has been no acute injuries.  No interval subsidence.  There is some HO forming proximal to the implant.  Unchanged position compared to previous.   Impression: Left hip hemiarthroplasty in excellent position.   Mordecai Rasmussen, MD 05/13/2020 8:58 AM

## 2020-05-26 DIAGNOSIS — H401131 Primary open-angle glaucoma, bilateral, mild stage: Secondary | ICD-10-CM | POA: Diagnosis not present

## 2020-06-27 DIAGNOSIS — I10 Essential (primary) hypertension: Secondary | ICD-10-CM | POA: Diagnosis not present

## 2020-06-27 DIAGNOSIS — S0990XA Unspecified injury of head, initial encounter: Secondary | ICD-10-CM | POA: Diagnosis not present

## 2020-06-27 DIAGNOSIS — R079 Chest pain, unspecified: Secondary | ICD-10-CM | POA: Diagnosis not present

## 2020-06-27 DIAGNOSIS — M47814 Spondylosis without myelopathy or radiculopathy, thoracic region: Secondary | ICD-10-CM | POA: Diagnosis not present

## 2020-06-27 DIAGNOSIS — G319 Degenerative disease of nervous system, unspecified: Secondary | ICD-10-CM | POA: Diagnosis not present

## 2020-06-27 DIAGNOSIS — N390 Urinary tract infection, site not specified: Secondary | ICD-10-CM | POA: Diagnosis not present

## 2020-06-27 DIAGNOSIS — W01198A Fall on same level from slipping, tripping and stumbling with subsequent striking against other object, initial encounter: Secondary | ICD-10-CM | POA: Diagnosis not present

## 2020-06-27 DIAGNOSIS — R519 Headache, unspecified: Secondary | ICD-10-CM | POA: Diagnosis not present

## 2020-06-27 DIAGNOSIS — S0003XA Contusion of scalp, initial encounter: Secondary | ICD-10-CM | POA: Diagnosis not present

## 2020-06-27 DIAGNOSIS — I700 Atherosclerosis of aorta: Secondary | ICD-10-CM | POA: Diagnosis not present

## 2020-06-27 DIAGNOSIS — M25552 Pain in left hip: Secondary | ICD-10-CM | POA: Diagnosis not present

## 2020-06-27 DIAGNOSIS — W19XXXA Unspecified fall, initial encounter: Secondary | ICD-10-CM | POA: Diagnosis not present

## 2020-07-12 DIAGNOSIS — H401112 Primary open-angle glaucoma, right eye, moderate stage: Secondary | ICD-10-CM | POA: Diagnosis not present

## 2020-07-19 DIAGNOSIS — I10 Essential (primary) hypertension: Secondary | ICD-10-CM | POA: Diagnosis not present

## 2020-07-19 DIAGNOSIS — E7849 Other hyperlipidemia: Secondary | ICD-10-CM | POA: Diagnosis not present

## 2020-07-19 DIAGNOSIS — E1165 Type 2 diabetes mellitus with hyperglycemia: Secondary | ICD-10-CM | POA: Diagnosis not present

## 2020-07-21 ENCOUNTER — Other Ambulatory Visit (HOSPITAL_COMMUNITY): Payer: Self-pay | Admitting: Family Medicine

## 2020-07-21 DIAGNOSIS — Z1231 Encounter for screening mammogram for malignant neoplasm of breast: Secondary | ICD-10-CM

## 2020-08-08 ENCOUNTER — Other Ambulatory Visit: Payer: Self-pay

## 2020-08-08 ENCOUNTER — Ambulatory Visit (HOSPITAL_COMMUNITY)
Admission: RE | Admit: 2020-08-08 | Discharge: 2020-08-08 | Disposition: A | Discharge status: Home / Self Care | Payer: PPO | Source: Ambulatory Visit | Attending: Family Medicine | Admitting: Family Medicine

## 2020-08-08 DIAGNOSIS — Z1231 Encounter for screening mammogram for malignant neoplasm of breast: Secondary | ICD-10-CM | POA: Diagnosis not present

## 2020-08-08 IMAGING — MG MM DIGITAL SCREENING BILAT W/ TOMO AND CAD
6 of 10 series · 6 of 30 positions shown · non-contrast
Comparison: Previous exam(s).

CLINICAL DATA: Screening.

EXAM:
DIGITAL SCREENING BILATERAL MAMMOGRAM WITH TOMOSYNTHESIS AND CAD
TECHNIQUE: Bilateral screening digital craniocaudal and mediolateral oblique
mammograms were obtained. Bilateral screening digital breast
tomosynthesis was performed. The images were evaluated with
computer-aided detection.

[L MLO synth-2D]
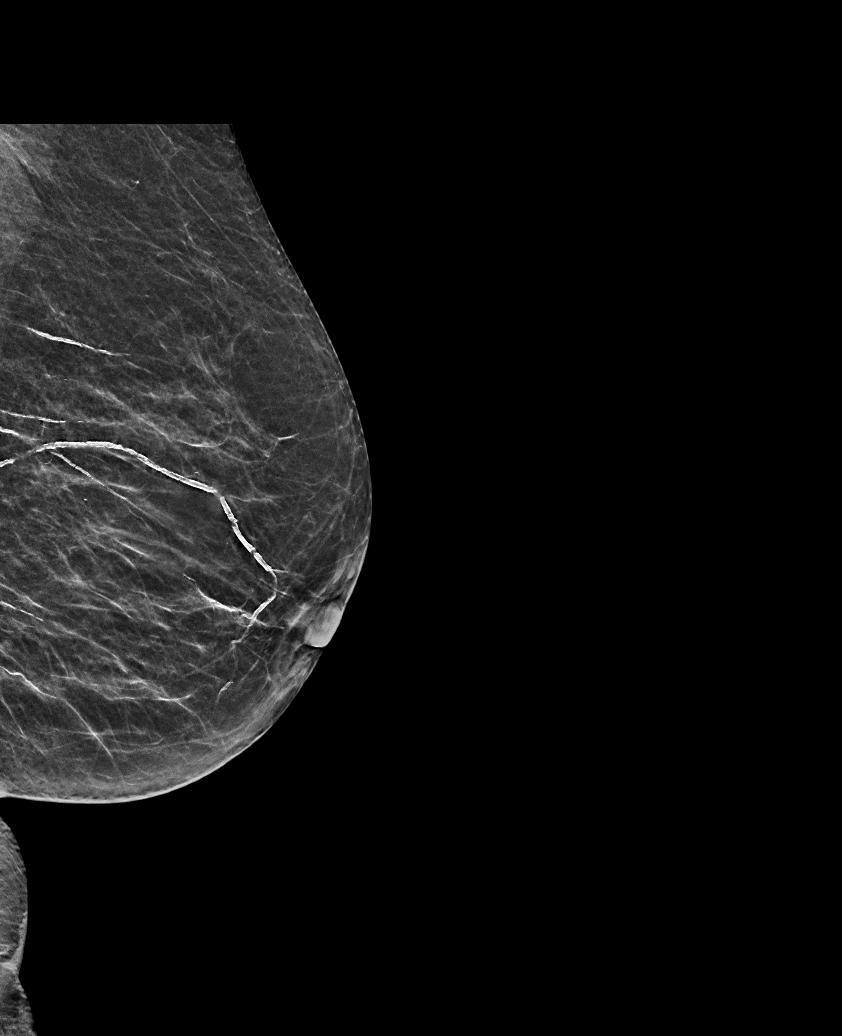

[L CC synth-2D (1 of 2)]
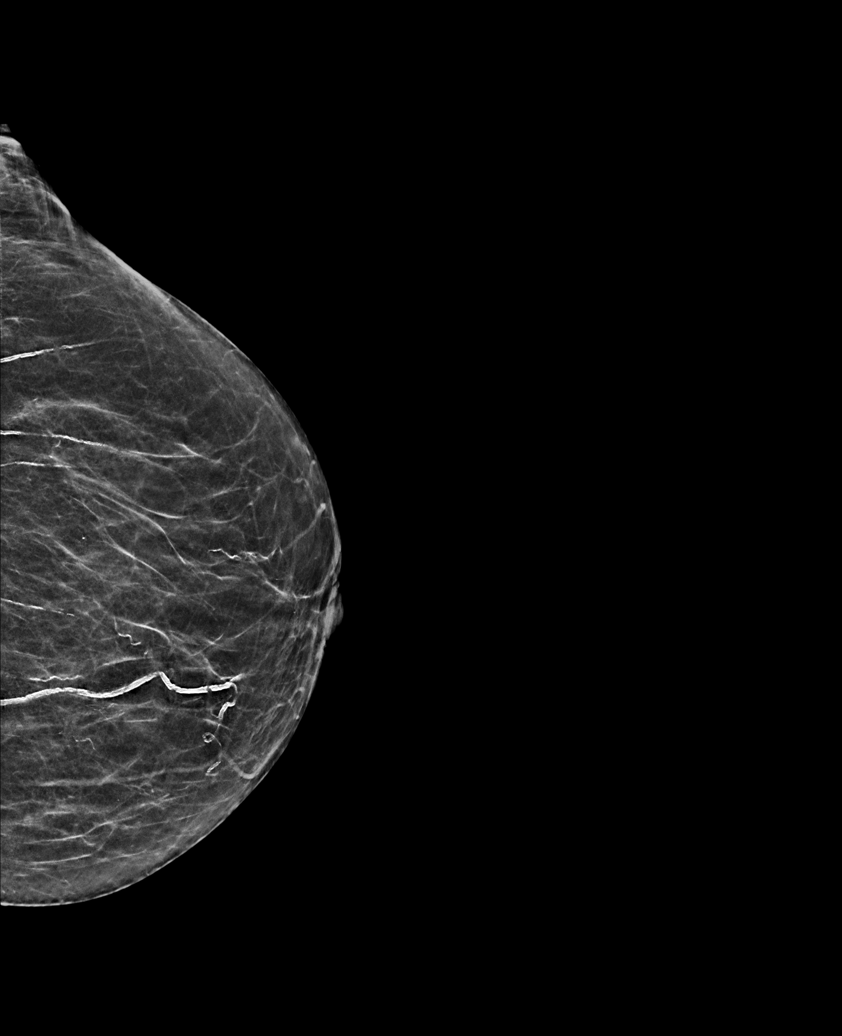

[R MLO synth-2D]
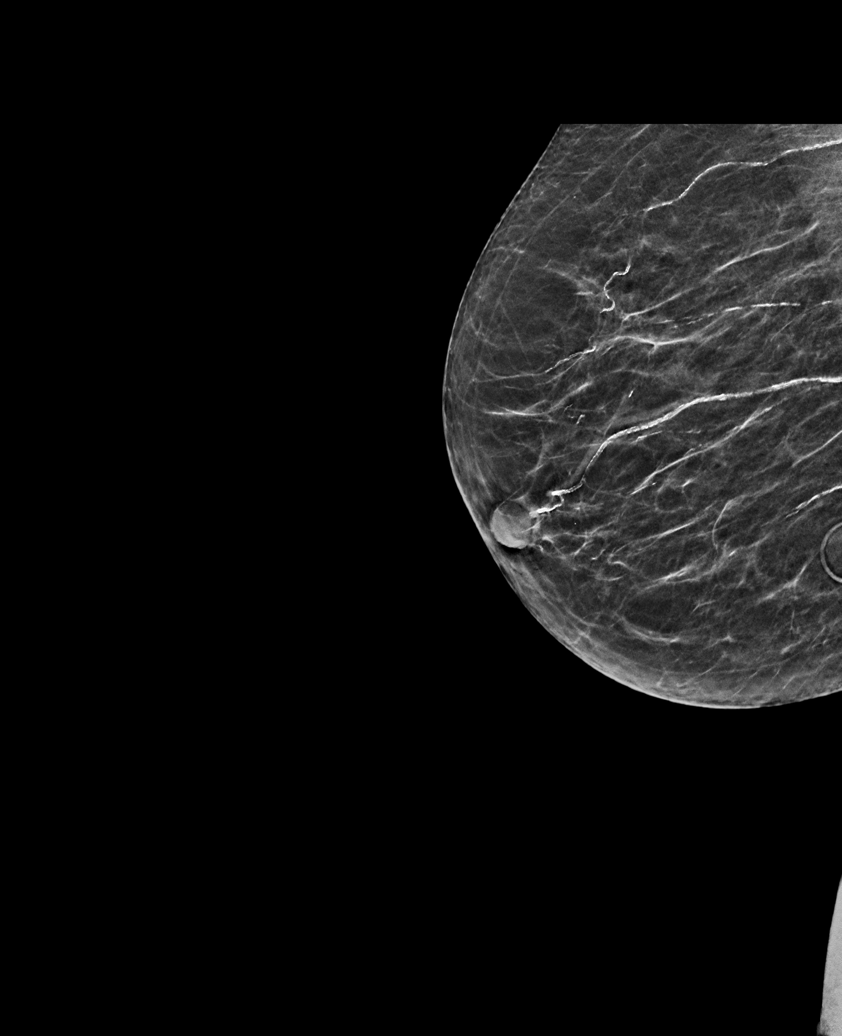

[R CC synth-2D]
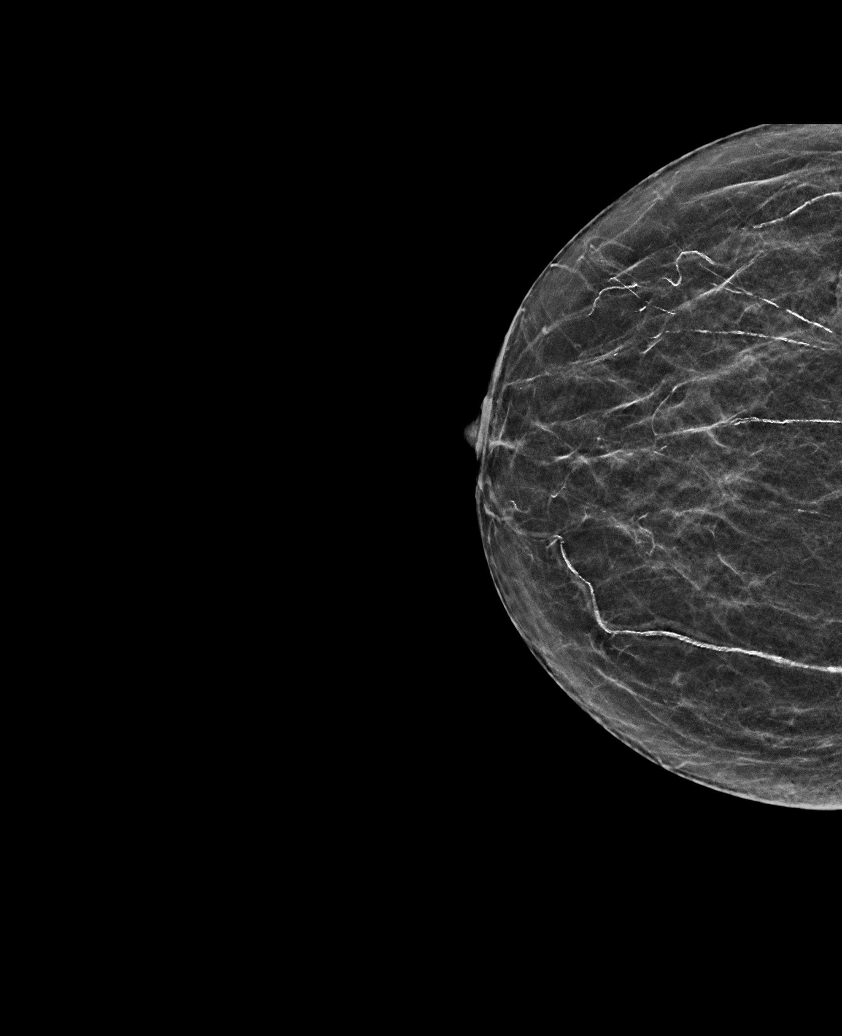

[L CC synth-2D (2 of 2)]
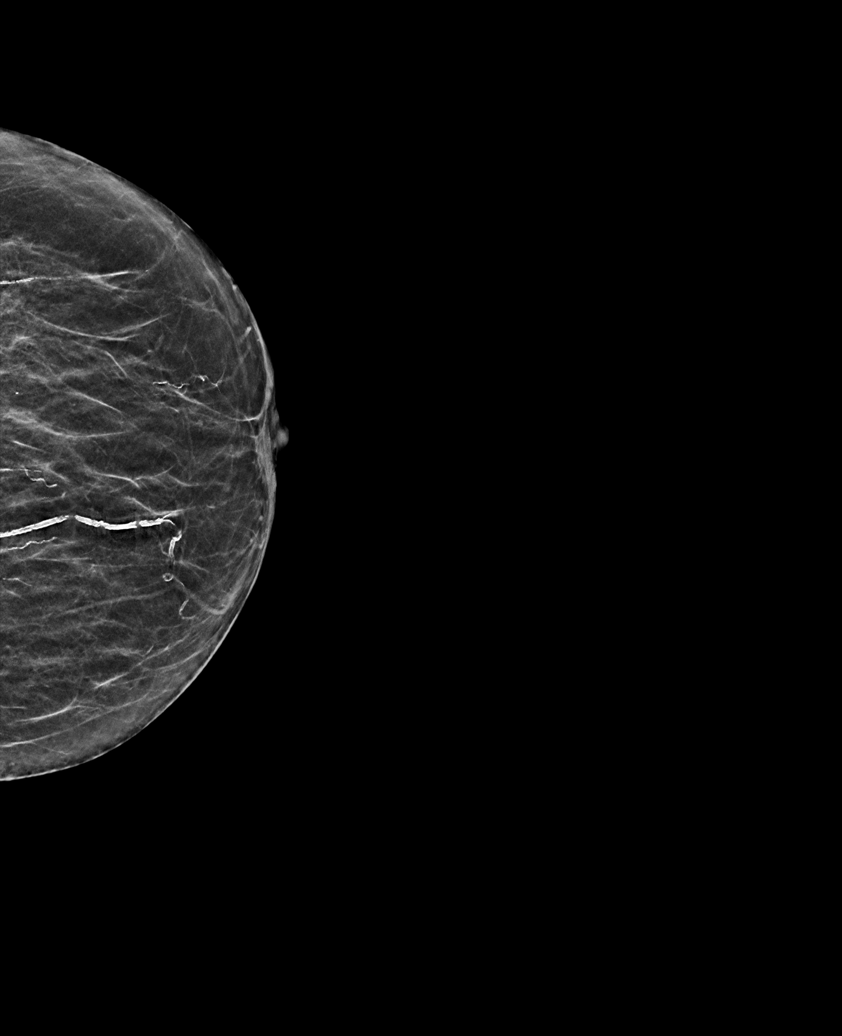

[R MLO tomo · tomo slice 23/45.0]
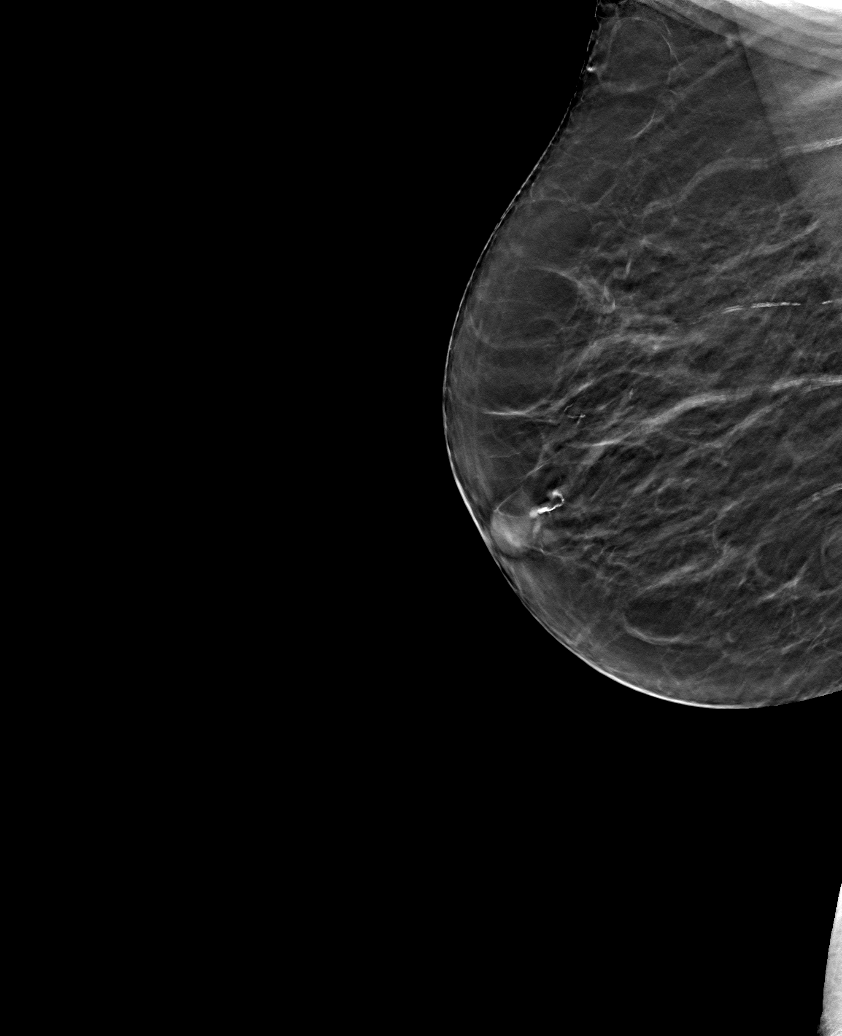

[6 of 30 positions shown; findings below may reference images not displayed]

ACR Breast Density Category b: There are scattered areas of
fibroglandular density.
FINDINGS: There are no findings suspicious for malignancy.
IMPRESSION: No mammographic evidence of malignancy. A result letter of this
screening mammogram will be mailed directly to the patient.

RECOMMENDATION:
Screening mammogram in one year. (Code:[BY])

BI-RADS CATEGORY  1: Negative.

## 2020-08-10 DIAGNOSIS — Z6824 Body mass index (BMI) 24.0-24.9, adult: Secondary | ICD-10-CM | POA: Diagnosis not present

## 2020-08-10 DIAGNOSIS — E7849 Other hyperlipidemia: Secondary | ICD-10-CM | POA: Diagnosis not present

## 2020-08-10 DIAGNOSIS — I10 Essential (primary) hypertension: Secondary | ICD-10-CM | POA: Diagnosis not present

## 2020-08-10 DIAGNOSIS — E782 Mixed hyperlipidemia: Secondary | ICD-10-CM | POA: Diagnosis not present

## 2020-08-10 DIAGNOSIS — Z96642 Presence of left artificial hip joint: Secondary | ICD-10-CM | POA: Diagnosis not present

## 2020-08-10 DIAGNOSIS — M1991 Primary osteoarthritis, unspecified site: Secondary | ICD-10-CM | POA: Diagnosis not present

## 2020-08-10 DIAGNOSIS — E118 Type 2 diabetes mellitus with unspecified complications: Secondary | ICD-10-CM | POA: Diagnosis not present

## 2020-08-10 DIAGNOSIS — N390 Urinary tract infection, site not specified: Secondary | ICD-10-CM | POA: Diagnosis not present

## 2020-08-18 DIAGNOSIS — E7849 Other hyperlipidemia: Secondary | ICD-10-CM | POA: Diagnosis not present

## 2020-08-18 DIAGNOSIS — I10 Essential (primary) hypertension: Secondary | ICD-10-CM | POA: Diagnosis not present

## 2020-08-18 DIAGNOSIS — E1165 Type 2 diabetes mellitus with hyperglycemia: Secondary | ICD-10-CM | POA: Diagnosis not present

## 2020-10-04 DIAGNOSIS — H401112 Primary open-angle glaucoma, right eye, moderate stage: Secondary | ICD-10-CM | POA: Diagnosis not present

## 2020-11-07 ENCOUNTER — Inpatient Hospital Stay (HOSPITAL_COMMUNITY)
Admission: EM | Admit: 2020-11-07 | Discharge: 2020-11-18 | DRG: 515 | Disposition: A | Discharge status: Rehabilitation Facility | Payer: PPO | Attending: Family Medicine | Admitting: Family Medicine

## 2020-11-07 ENCOUNTER — Emergency Department (HOSPITAL_COMMUNITY): Payer: PPO

## 2020-11-07 ENCOUNTER — Encounter (HOSPITAL_COMMUNITY): Payer: Self-pay | Admitting: Emergency Medicine

## 2020-11-07 ENCOUNTER — Other Ambulatory Visit: Payer: Self-pay

## 2020-11-07 DIAGNOSIS — E7800 Pure hypercholesterolemia, unspecified: Secondary | ICD-10-CM | POA: Diagnosis not present

## 2020-11-07 DIAGNOSIS — I69319 Unspecified symptoms and signs involving cognitive functions following cerebral infarction: Secondary | ICD-10-CM | POA: Diagnosis not present

## 2020-11-07 DIAGNOSIS — E1165 Type 2 diabetes mellitus with hyperglycemia: Secondary | ICD-10-CM | POA: Diagnosis present

## 2020-11-07 DIAGNOSIS — Z20822 Contact with and (suspected) exposure to covid-19: Secondary | ICD-10-CM | POA: Diagnosis present

## 2020-11-07 DIAGNOSIS — I6389 Other cerebral infarction: Secondary | ICD-10-CM | POA: Diagnosis not present

## 2020-11-07 DIAGNOSIS — I651 Occlusion and stenosis of basilar artery: Secondary | ICD-10-CM | POA: Diagnosis not present

## 2020-11-07 DIAGNOSIS — I6503 Occlusion and stenosis of bilateral vertebral arteries: Secondary | ICD-10-CM | POA: Diagnosis present

## 2020-11-07 DIAGNOSIS — R0981 Nasal congestion: Secondary | ICD-10-CM | POA: Diagnosis present

## 2020-11-07 DIAGNOSIS — Z794 Long term (current) use of insulin: Secondary | ICD-10-CM | POA: Diagnosis not present

## 2020-11-07 DIAGNOSIS — Z961 Presence of intraocular lens: Secondary | ICD-10-CM | POA: Diagnosis present

## 2020-11-07 DIAGNOSIS — Z23 Encounter for immunization: Secondary | ICD-10-CM | POA: Diagnosis present

## 2020-11-07 DIAGNOSIS — E118 Type 2 diabetes mellitus with unspecified complications: Secondary | ICD-10-CM | POA: Diagnosis not present

## 2020-11-07 DIAGNOSIS — H4921 Sixth [abducent] nerve palsy, right eye: Secondary | ICD-10-CM | POA: Diagnosis present

## 2020-11-07 DIAGNOSIS — E119 Type 2 diabetes mellitus without complications: Secondary | ICD-10-CM | POA: Diagnosis not present

## 2020-11-07 DIAGNOSIS — H409 Unspecified glaucoma: Secondary | ICD-10-CM | POA: Diagnosis present

## 2020-11-07 DIAGNOSIS — E785 Hyperlipidemia, unspecified: Secondary | ICD-10-CM

## 2020-11-07 DIAGNOSIS — I63541 Cerebral infarction due to unspecified occlusion or stenosis of right cerebellar artery: Secondary | ICD-10-CM | POA: Diagnosis present

## 2020-11-07 DIAGNOSIS — I672 Cerebral atherosclerosis: Secondary | ICD-10-CM | POA: Diagnosis not present

## 2020-11-07 DIAGNOSIS — I69393 Ataxia following cerebral infarction: Secondary | ICD-10-CM | POA: Diagnosis not present

## 2020-11-07 DIAGNOSIS — Z79899 Other long term (current) drug therapy: Secondary | ICD-10-CM

## 2020-11-07 DIAGNOSIS — E782 Mixed hyperlipidemia: Secondary | ICD-10-CM | POA: Diagnosis present

## 2020-11-07 DIAGNOSIS — I951 Orthostatic hypotension: Secondary | ICD-10-CM | POA: Diagnosis present

## 2020-11-07 DIAGNOSIS — Z7902 Long term (current) use of antithrombotics/antiplatelets: Secondary | ICD-10-CM

## 2020-11-07 DIAGNOSIS — Z66 Do not resuscitate: Secondary | ICD-10-CM | POA: Diagnosis present

## 2020-11-07 DIAGNOSIS — I10 Essential (primary) hypertension: Secondary | ICD-10-CM | POA: Diagnosis present

## 2020-11-07 DIAGNOSIS — R29704 NIHSS score 4: Secondary | ICD-10-CM | POA: Diagnosis present

## 2020-11-07 DIAGNOSIS — I639 Cerebral infarction, unspecified: Secondary | ICD-10-CM | POA: Diagnosis not present

## 2020-11-07 DIAGNOSIS — Z9841 Cataract extraction status, right eye: Secondary | ICD-10-CM | POA: Diagnosis not present

## 2020-11-07 DIAGNOSIS — H5461 Unqualified visual loss, right eye, normal vision left eye: Secondary | ICD-10-CM

## 2020-11-07 DIAGNOSIS — H53122 Transient visual loss, left eye: Secondary | ICD-10-CM | POA: Diagnosis present

## 2020-11-07 DIAGNOSIS — Z9842 Cataract extraction status, left eye: Secondary | ICD-10-CM | POA: Diagnosis not present

## 2020-11-07 DIAGNOSIS — I6330 Cerebral infarction due to thrombosis of unspecified cerebral artery: Secondary | ICD-10-CM | POA: Diagnosis not present

## 2020-11-07 DIAGNOSIS — G319 Degenerative disease of nervous system, unspecified: Secondary | ICD-10-CM | POA: Diagnosis not present

## 2020-11-07 DIAGNOSIS — M4802 Spinal stenosis, cervical region: Secondary | ICD-10-CM | POA: Diagnosis not present

## 2020-11-07 DIAGNOSIS — M316 Other giant cell arteritis: Secondary | ICD-10-CM | POA: Diagnosis present

## 2020-11-07 DIAGNOSIS — H547 Unspecified visual loss: Secondary | ICD-10-CM | POA: Diagnosis not present

## 2020-11-07 DIAGNOSIS — R55 Syncope and collapse: Secondary | ICD-10-CM | POA: Diagnosis not present

## 2020-11-07 DIAGNOSIS — I6523 Occlusion and stenosis of bilateral carotid arteries: Secondary | ICD-10-CM | POA: Diagnosis not present

## 2020-11-07 DIAGNOSIS — Z7984 Long term (current) use of oral hypoglycemic drugs: Secondary | ICD-10-CM

## 2020-11-07 DIAGNOSIS — Z7982 Long term (current) use of aspirin: Secondary | ICD-10-CM | POA: Diagnosis not present

## 2020-11-07 DIAGNOSIS — I6603 Occlusion and stenosis of bilateral middle cerebral arteries: Secondary | ICD-10-CM | POA: Diagnosis not present

## 2020-11-07 DIAGNOSIS — I6623 Occlusion and stenosis of bilateral posterior cerebral arteries: Secondary | ICD-10-CM | POA: Diagnosis not present

## 2020-11-07 DIAGNOSIS — R519 Headache, unspecified: Secondary | ICD-10-CM | POA: Diagnosis not present

## 2020-11-07 DIAGNOSIS — I6613 Occlusion and stenosis of bilateral anterior cerebral arteries: Secondary | ICD-10-CM | POA: Diagnosis not present

## 2020-11-07 DIAGNOSIS — Z96642 Presence of left artificial hip joint: Secondary | ICD-10-CM | POA: Diagnosis present

## 2020-11-07 DIAGNOSIS — H53131 Sudden visual loss, right eye: Secondary | ICD-10-CM | POA: Diagnosis not present

## 2020-11-07 DIAGNOSIS — I69398 Other sequelae of cerebral infarction: Secondary | ICD-10-CM | POA: Diagnosis not present

## 2020-11-07 DIAGNOSIS — H539 Unspecified visual disturbance: Secondary | ICD-10-CM | POA: Diagnosis not present

## 2020-11-07 DIAGNOSIS — I69351 Hemiplegia and hemiparesis following cerebral infarction affecting right dominant side: Secondary | ICD-10-CM | POA: Diagnosis not present

## 2020-11-07 DIAGNOSIS — H401121 Primary open-angle glaucoma, left eye, mild stage: Secondary | ICD-10-CM | POA: Diagnosis not present

## 2020-11-07 LAB — I-STAT CHEM 8, ED
BUN: 19 mg/dL (ref 8–23)
Calcium, Ion: 1.26 mmol/L (ref 1.15–1.40)
Chloride: 100 mmol/L (ref 98–111)
Creatinine, Ser: 0.8 mg/dL (ref 0.44–1.00)
Glucose, Bld: 207 mg/dL — ABNORMAL HIGH (ref 70–99)
HCT: 32 % — ABNORMAL LOW (ref 36.0–46.0)
Hemoglobin: 10.9 g/dL — ABNORMAL LOW (ref 12.0–15.0)
Potassium: 4.3 mmol/L (ref 3.5–5.1)
Sodium: 136 mmol/L (ref 135–145)
TCO2: 26 mmol/L (ref 22–32)

## 2020-11-07 LAB — COMPREHENSIVE METABOLIC PANEL
ALT: 13 U/L (ref 0–44)
AST: 13 U/L — ABNORMAL LOW (ref 15–41)
Albumin: 2.9 g/dL — ABNORMAL LOW (ref 3.5–5.0)
Alkaline Phosphatase: 81 U/L (ref 38–126)
Anion gap: 11 (ref 5–15)
BUN: 18 mg/dL (ref 8–23)
CO2: 24 mmol/L (ref 22–32)
Calcium: 9.3 mg/dL (ref 8.9–10.3)
Chloride: 99 mmol/L (ref 98–111)
Creatinine, Ser: 0.81 mg/dL (ref 0.44–1.00)
GFR, Estimated: >60 mL/min (ref >60)
Glucose, Bld: 205 mg/dL — ABNORMAL HIGH (ref 70–99)
Potassium: 4.3 mmol/L (ref 3.5–5.1)
Sodium: 134 mmol/L — ABNORMAL LOW (ref 135–145)
Total Bilirubin: 0.5 mg/dL (ref 0.3–1.2)
Total Protein: 8 g/dL (ref 6.5–8.1)

## 2020-11-07 LAB — DIFFERENTIAL
Abs Immature Granulocytes: 0.05 10*3/uL (ref 0.00–0.07)
Basophils Absolute: 0 10*3/uL (ref 0.0–0.1)
Basophils Relative: 0 %
Eosinophils Absolute: 0 10*3/uL (ref 0.0–0.5)
Eosinophils Relative: 0 %
Immature Granulocytes: 1 %
Lymphocytes Relative: 29 %
Lymphs Abs: 1.9 10*3/uL (ref 0.7–4.0)
Monocytes Absolute: 0.6 10*3/uL (ref 0.1–1.0)
Monocytes Relative: 9 %
Neutro Abs: 4.1 10*3/uL (ref 1.7–7.7)
Neutrophils Relative %: 61 %

## 2020-11-07 LAB — CBC
HCT: 32.2 % — ABNORMAL LOW (ref 36.0–46.0)
Hemoglobin: 10 g/dL — ABNORMAL LOW (ref 12.0–15.0)
MCH: 27.9 pg (ref 26.0–34.0)
MCHC: 31.1 g/dL (ref 30.0–36.0)
MCV: 89.7 fL (ref 80.0–100.0)
Platelets: 516 10*3/uL — ABNORMAL HIGH (ref 150–400)
RBC: 3.59 MIL/uL — ABNORMAL LOW (ref 3.87–5.11)
RDW: 14.6 % (ref 11.5–15.5)
WBC: 6.7 10*3/uL (ref 4.0–10.5)
nRBC: 0 % (ref 0.0–0.2)

## 2020-11-07 LAB — PROTIME-INR
INR: 1.1 (ref 0.8–1.2)
Prothrombin Time: 14.6 seconds (ref 11.4–15.2)

## 2020-11-07 LAB — APTT: aPTT: 33 seconds (ref 24–36)

## 2020-11-07 IMAGING — CT CT HEAD W/O CM
4 series · 17 of 47 positions shown, 19 images · non-contrast
Comparison: [DATE]

CLINICAL DATA: Headache.

EXAM:
CT HEAD WITHOUT CONTRAST
TECHNIQUE: Contiguous axial images were obtained from the base of the skull
through the vertex without intravenous contrast.

[Series 3: head without · axial · non-contrast · 0.47mm/px · z∈[-110,+10]mm · 7 of 32 slices shown, 9 images]
[im 4/32  brain]
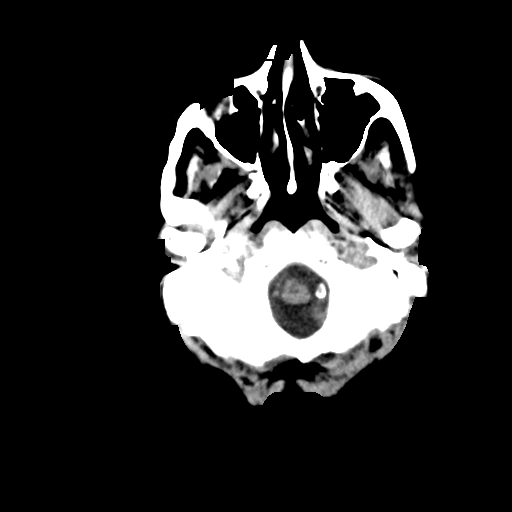
[im 4/32  bone]
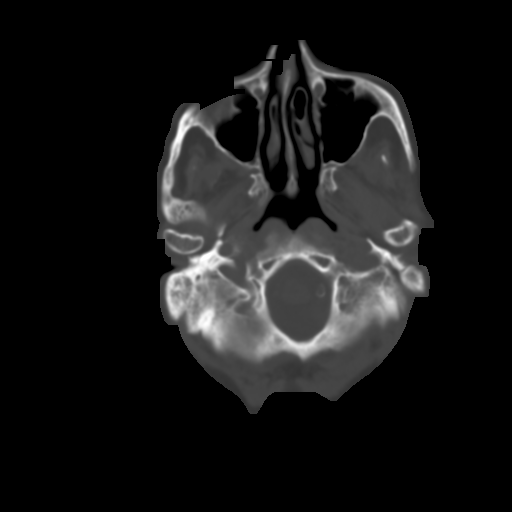
[im 8/32  brain]
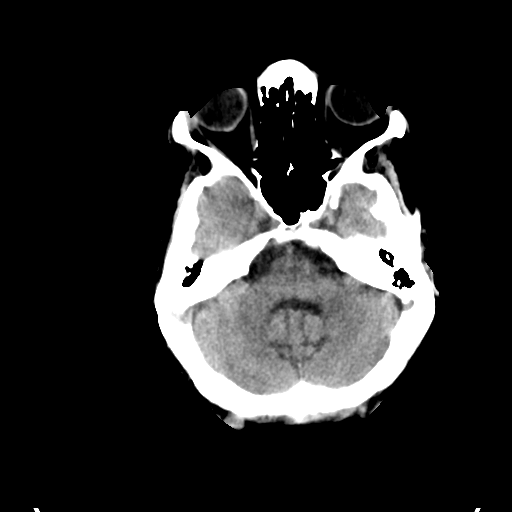
[im 12/32  brain]
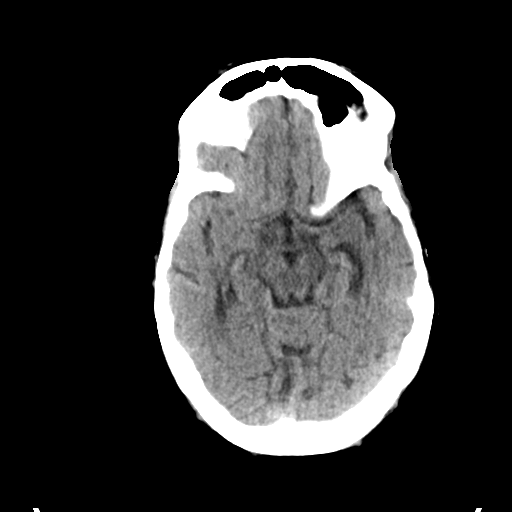
[im 16/32  brain]
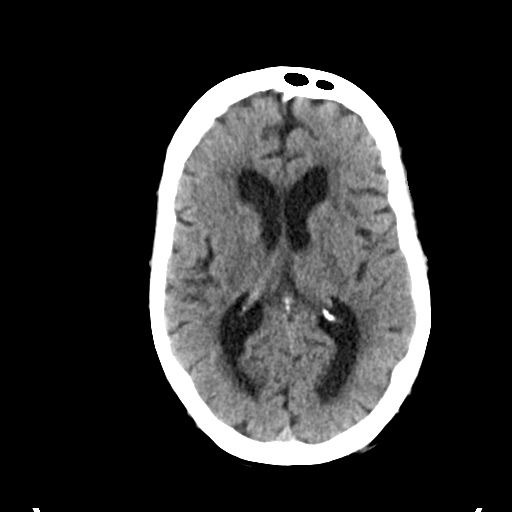
[im 20/32  brain]
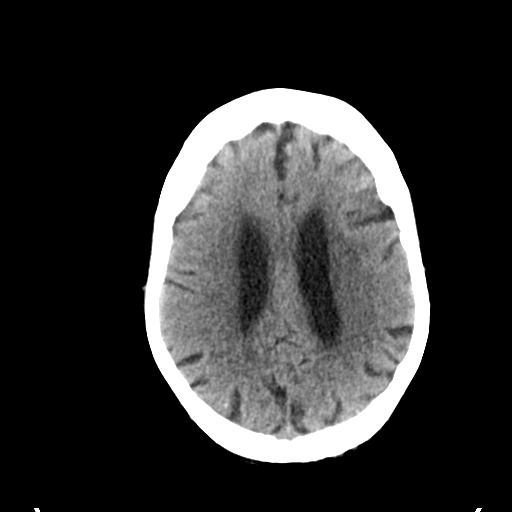
[im 20/32  bone]
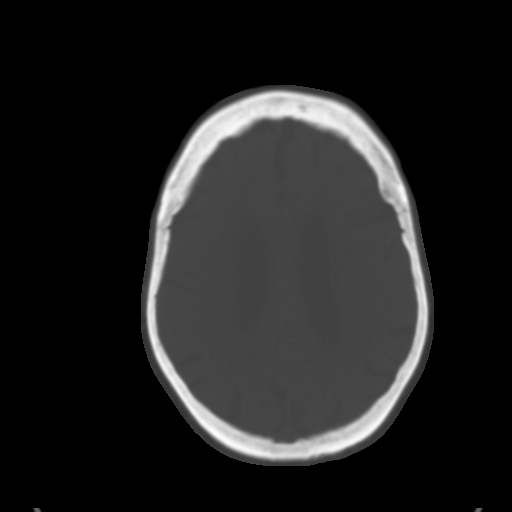
[im 24/32  brain]
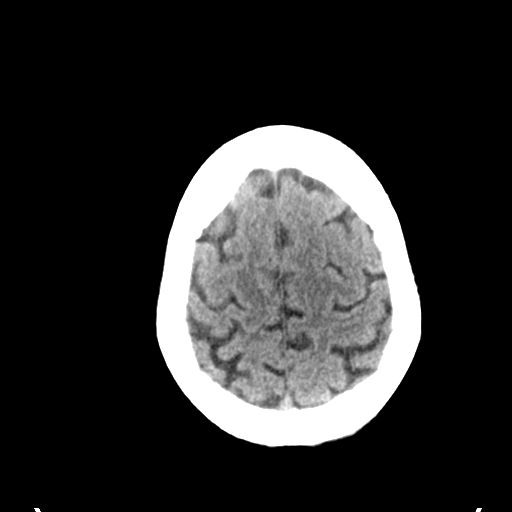
[im 28/32  brain]
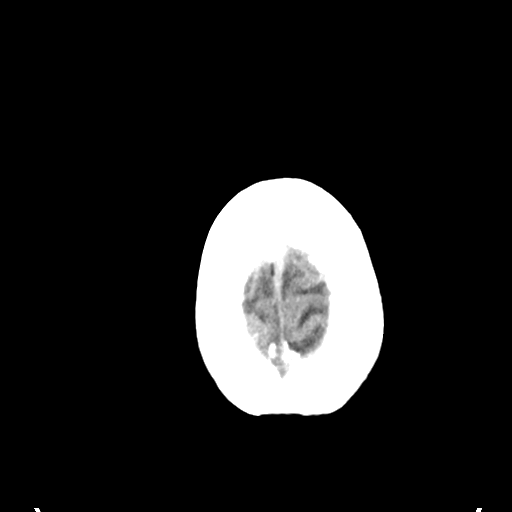

[Series 4: head bone · axial · 0.47mm/px · z∈[-111,-55]mm · 4 of 79 slices shown]
[im 8/79  bone]
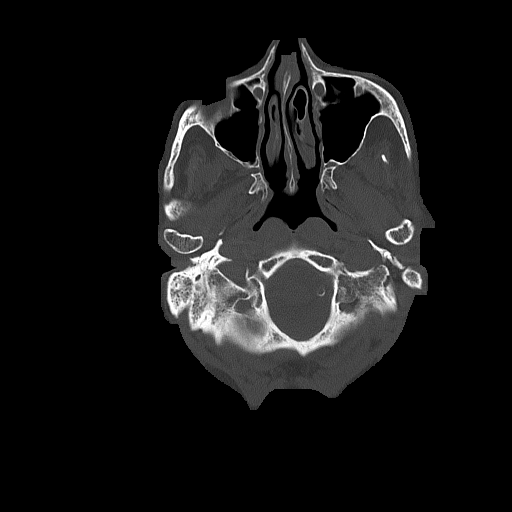
[im 16/79  bone]
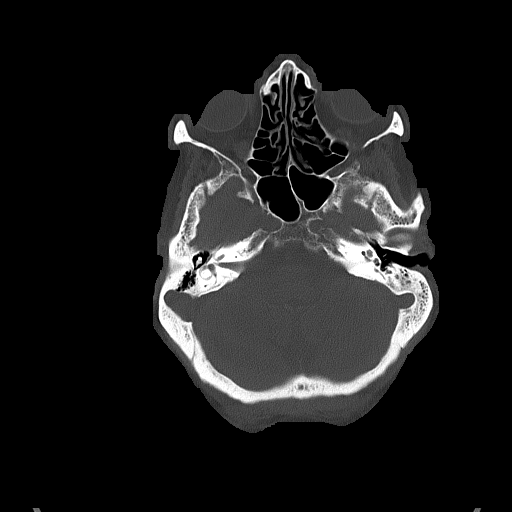
[im 24/79  bone]
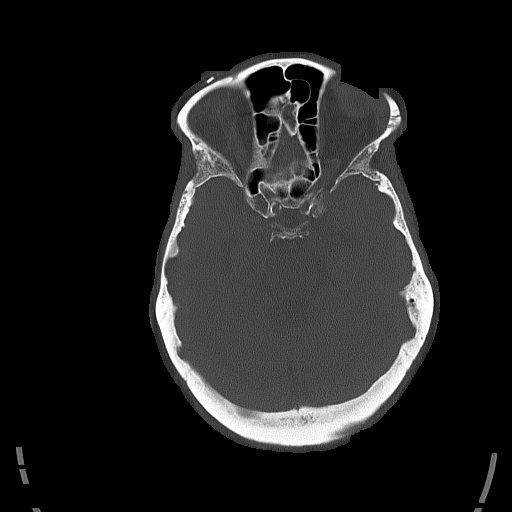
[im 36/79  bone]
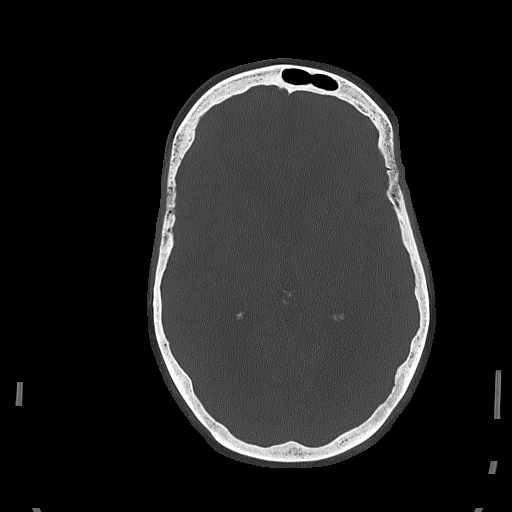

[Series 5: head without cor · coronal · non-contrast · 0.35mm/px · 3 of 65 slices shown]
[im 22/65  brain]
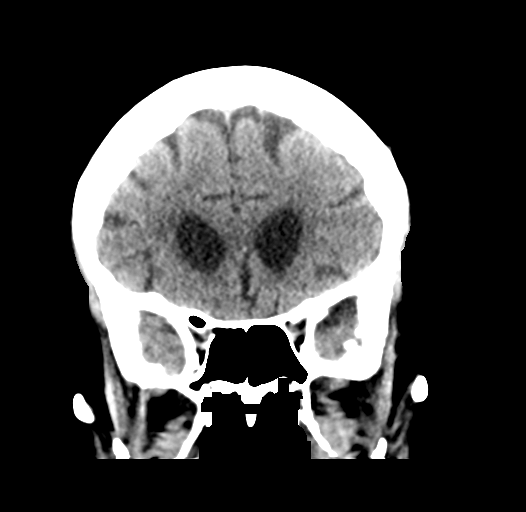
[im 29/65  brain]
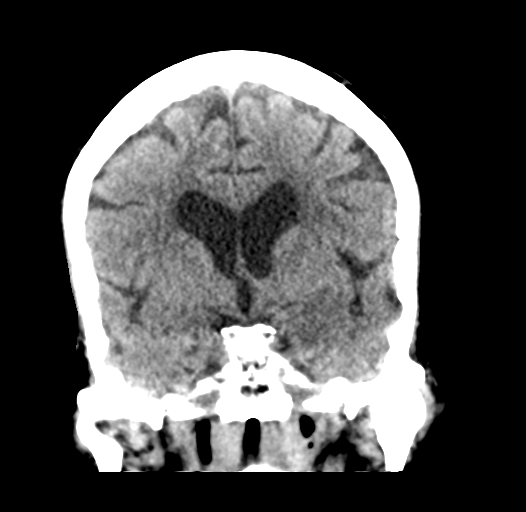
[im 36/65  brain]
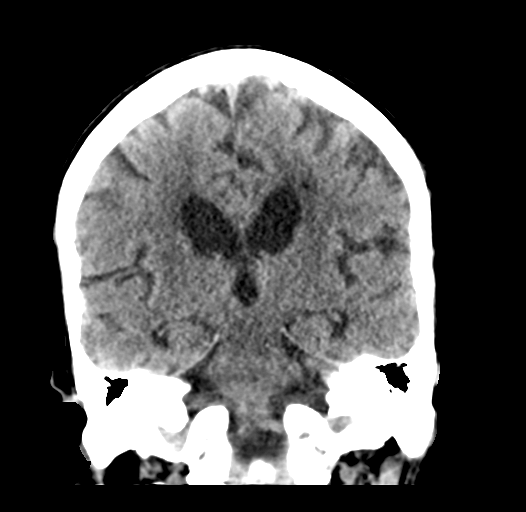

[Series 6: head without sag · sagittal · non-contrast · 0.35mm/px · 3 of 44 slices shown]
[im 15/44  brain]
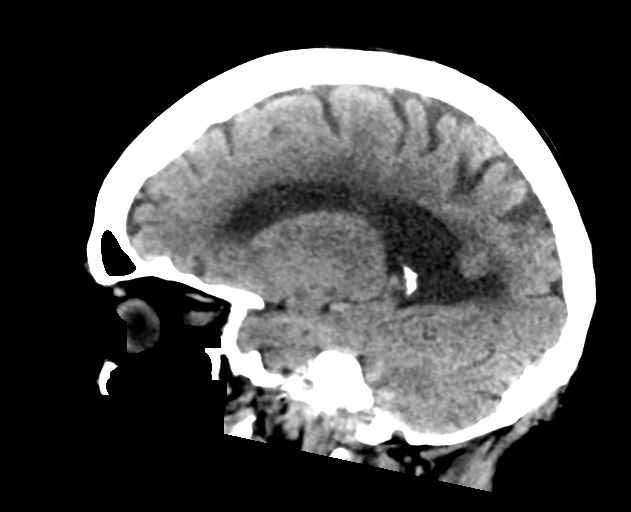
[im 22/44  brain]
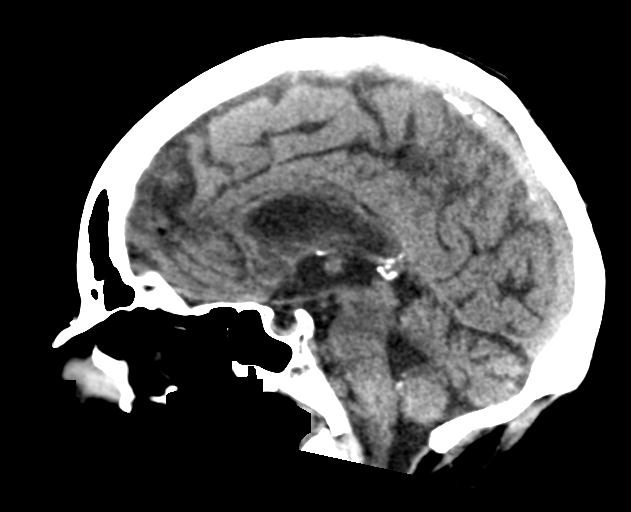
[im 29/44  brain]
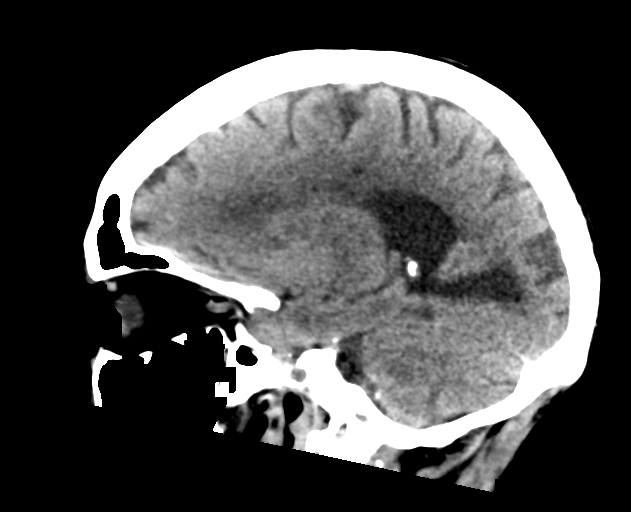

[17 of 47 positions shown; findings below may reference images not displayed]

FINDINGS: Brain: There is mild cerebral atrophy with widening of the
extra-axial spaces and ventricular dilatation.
There are areas of decreased attenuation within the white matter
tracts of the supratentorial brain, consistent with microvascular
disease changes.

Vascular: No hyperdense vessel or unexpected calcification.

Skull: Normal. Negative for fracture or focal lesion.

Sinuses/Orbits: No acute finding.

Other: None.
IMPRESSION: 1. Generalized cerebral atrophy.
2. No acute intracranial abnormality.

## 2020-11-07 NOTE — ED Notes (Addendum)
Received a call from opthamology MD, pt needs sed rate and CRP ordered, Phylliss Bob, [place] gave verbal order for same. Opthamology MD states if elevated, pt needs IV solumedrol for giant cell arteritis, PA aware.

## 2020-11-07 NOTE — ED Triage Notes (Signed)
Pt sent here by eye dr for loss of vision in R eye. Pt has had headache on R side w/ sinus pressure, and a "film over R eye" for 3 days. Today pt went to eye dr and can only see black out of R eye. PT has no unilateral weakness, sensation and motor intact.

## 2020-11-07 NOTE — ED Provider Notes (Signed)
Emergency Medicine Provider Triage Evaluation Note  Jenna Long , a 86 y.o. female  was evaluated in triage.  Pt complains of loss of vision in the right eye.  This has been ongoing for three days.  She was sent to Dr. Katy Fitch but by the time they got there they were closed.  Daughter tried to call the office and they didn't answer.    Review of Systems  Positive: Right eye vision loss Negative: weakness  Physical Exam  BP (!) 158/71   Pulse 82   Temp 98.5 F (36.9 C)   Resp 16   SpO2 100%  Gen:   Awake, no distress   Resp:  Normal effort  MSK:   Moves extremities without difficulty No pronator drift.  Facial movements are symmetric.  Moves all 4 extremities. Unable to count fingers out of right eye.  Other:  Speech is not slurred.   Medical Decision Making  Medically screening exam initiated at 8:17 PM.  Appropriate orders placed.  JHANA GIARRATANO was informed that the remainder of the evaluation will be completed by another provider, this initial triage assessment does not replace that evaluation, and the importance of remaining in the ED until their evaluation is complete.  Note: Portions of this report may have been transcribed using voice recognition software. Every effort was made to ensure accuracy; however, inadvertent computerized transcription errors may be present.   Is symptoms of been going on for 3 days patient is outside of any code stroke window.    Ollen Gross 11/07/20 2021    Davonna Belling, MD 11/07/20 [phone]

## 2020-11-08 ENCOUNTER — Inpatient Hospital Stay (HOSPITAL_COMMUNITY): Payer: PPO

## 2020-11-08 ENCOUNTER — Encounter (HOSPITAL_COMMUNITY): Payer: Self-pay | Admitting: Internal Medicine

## 2020-11-08 DIAGNOSIS — E119 Type 2 diabetes mellitus without complications: Secondary | ICD-10-CM | POA: Diagnosis not present

## 2020-11-08 DIAGNOSIS — Z20822 Contact with and (suspected) exposure to covid-19: Secondary | ICD-10-CM | POA: Diagnosis present

## 2020-11-08 DIAGNOSIS — Z7902 Long term (current) use of antithrombotics/antiplatelets: Secondary | ICD-10-CM | POA: Diagnosis not present

## 2020-11-08 DIAGNOSIS — I951 Orthostatic hypotension: Secondary | ICD-10-CM | POA: Diagnosis present

## 2020-11-08 DIAGNOSIS — H409 Unspecified glaucoma: Secondary | ICD-10-CM | POA: Diagnosis present

## 2020-11-08 DIAGNOSIS — Z66 Do not resuscitate: Secondary | ICD-10-CM

## 2020-11-08 DIAGNOSIS — Z9841 Cataract extraction status, right eye: Secondary | ICD-10-CM | POA: Diagnosis not present

## 2020-11-08 DIAGNOSIS — H5461 Unqualified visual loss, right eye, normal vision left eye: Secondary | ICD-10-CM | POA: Diagnosis present

## 2020-11-08 DIAGNOSIS — I6503 Occlusion and stenosis of bilateral vertebral arteries: Secondary | ICD-10-CM | POA: Diagnosis present

## 2020-11-08 DIAGNOSIS — I6389 Other cerebral infarction: Secondary | ICD-10-CM | POA: Diagnosis not present

## 2020-11-08 DIAGNOSIS — E785 Hyperlipidemia, unspecified: Secondary | ICD-10-CM | POA: Diagnosis not present

## 2020-11-08 DIAGNOSIS — H4921 Sixth [abducent] nerve palsy, right eye: Secondary | ICD-10-CM | POA: Diagnosis present

## 2020-11-08 DIAGNOSIS — H539 Unspecified visual disturbance: Secondary | ICD-10-CM | POA: Diagnosis not present

## 2020-11-08 DIAGNOSIS — Z9842 Cataract extraction status, left eye: Secondary | ICD-10-CM | POA: Diagnosis not present

## 2020-11-08 DIAGNOSIS — R55 Syncope and collapse: Secondary | ICD-10-CM | POA: Diagnosis not present

## 2020-11-08 DIAGNOSIS — I6523 Occlusion and stenosis of bilateral carotid arteries: Secondary | ICD-10-CM | POA: Diagnosis not present

## 2020-11-08 DIAGNOSIS — I651 Occlusion and stenosis of basilar artery: Secondary | ICD-10-CM | POA: Diagnosis not present

## 2020-11-08 DIAGNOSIS — I6330 Cerebral infarction due to thrombosis of unspecified cerebral artery: Secondary | ICD-10-CM | POA: Diagnosis not present

## 2020-11-08 DIAGNOSIS — Z7984 Long term (current) use of oral hypoglycemic drugs: Secondary | ICD-10-CM | POA: Diagnosis not present

## 2020-11-08 DIAGNOSIS — Z794 Long term (current) use of insulin: Secondary | ICD-10-CM | POA: Diagnosis not present

## 2020-11-08 DIAGNOSIS — I6613 Occlusion and stenosis of bilateral anterior cerebral arteries: Secondary | ICD-10-CM | POA: Diagnosis not present

## 2020-11-08 DIAGNOSIS — H547 Unspecified visual loss: Secondary | ICD-10-CM | POA: Diagnosis not present

## 2020-11-08 DIAGNOSIS — I6623 Occlusion and stenosis of bilateral posterior cerebral arteries: Secondary | ICD-10-CM | POA: Diagnosis not present

## 2020-11-08 DIAGNOSIS — I672 Cerebral atherosclerosis: Secondary | ICD-10-CM | POA: Diagnosis not present

## 2020-11-08 DIAGNOSIS — R0981 Nasal congestion: Secondary | ICD-10-CM | POA: Diagnosis present

## 2020-11-08 DIAGNOSIS — E118 Type 2 diabetes mellitus with unspecified complications: Secondary | ICD-10-CM | POA: Diagnosis not present

## 2020-11-08 DIAGNOSIS — I639 Cerebral infarction, unspecified: Secondary | ICD-10-CM

## 2020-11-08 DIAGNOSIS — Z7982 Long term (current) use of aspirin: Secondary | ICD-10-CM | POA: Diagnosis not present

## 2020-11-08 DIAGNOSIS — I63541 Cerebral infarction due to unspecified occlusion or stenosis of right cerebellar artery: Secondary | ICD-10-CM | POA: Diagnosis present

## 2020-11-08 DIAGNOSIS — Z23 Encounter for immunization: Secondary | ICD-10-CM | POA: Diagnosis present

## 2020-11-08 DIAGNOSIS — R519 Headache, unspecified: Secondary | ICD-10-CM | POA: Diagnosis not present

## 2020-11-08 DIAGNOSIS — Z79899 Other long term (current) drug therapy: Secondary | ICD-10-CM | POA: Diagnosis not present

## 2020-11-08 DIAGNOSIS — E782 Mixed hyperlipidemia: Secondary | ICD-10-CM | POA: Diagnosis present

## 2020-11-08 DIAGNOSIS — I6603 Occlusion and stenosis of bilateral middle cerebral arteries: Secondary | ICD-10-CM | POA: Diagnosis not present

## 2020-11-08 DIAGNOSIS — H53122 Transient visual loss, left eye: Secondary | ICD-10-CM | POA: Diagnosis present

## 2020-11-08 DIAGNOSIS — M4802 Spinal stenosis, cervical region: Secondary | ICD-10-CM | POA: Diagnosis not present

## 2020-11-08 DIAGNOSIS — G319 Degenerative disease of nervous system, unspecified: Secondary | ICD-10-CM | POA: Diagnosis not present

## 2020-11-08 DIAGNOSIS — Z96642 Presence of left artificial hip joint: Secondary | ICD-10-CM | POA: Diagnosis present

## 2020-11-08 DIAGNOSIS — M316 Other giant cell arteritis: Secondary | ICD-10-CM | POA: Diagnosis present

## 2020-11-08 DIAGNOSIS — E1165 Type 2 diabetes mellitus with hyperglycemia: Secondary | ICD-10-CM | POA: Diagnosis present

## 2020-11-08 DIAGNOSIS — R29704 NIHSS score 4: Secondary | ICD-10-CM | POA: Diagnosis present

## 2020-11-08 DIAGNOSIS — I10 Essential (primary) hypertension: Secondary | ICD-10-CM | POA: Diagnosis present

## 2020-11-08 DIAGNOSIS — Z961 Presence of intraocular lens: Secondary | ICD-10-CM | POA: Diagnosis present

## 2020-11-08 HISTORY — DX: Do not resuscitate: Z66

## 2020-11-08 LAB — CBG MONITORING, ED
Glucose-Capillary: 222 mg/dL — ABNORMAL HIGH (ref 70–99)
Glucose-Capillary: 250 mg/dL — ABNORMAL HIGH (ref 70–99)

## 2020-11-08 LAB — HEMOGLOBIN A1C
Hgb A1c MFr Bld: 6.3 % — ABNORMAL HIGH (ref 4.8–5.6)
Mean Plasma Glucose: 134.11 mg/dL

## 2020-11-08 LAB — C-REACTIVE PROTEIN: CRP: 8.6 mg/dL — ABNORMAL HIGH (ref <1.0)

## 2020-11-08 LAB — SARS CORONAVIRUS 2 (TAT 6-24 HRS): SARS Coronavirus 2: NEGATIVE

## 2020-11-08 LAB — SEDIMENTATION RATE: Sed Rate: 111 mm/hr — ABNORMAL HIGH (ref 0–22)

## 2020-11-08 IMAGING — MR MR HEAD WO/W CM
6 of 18 series · 20 of 48 positions shown · IV contrast (gadavist)
Comparison: Head CT [DATE].
COMPARISON: Head CT [DATE].

Addendum:
CLINICAL DATA: Vision loss, binocular; right-sided complete vision
loss in setting of sinus infection, abducent nerve palsy right eye,
differential including GCA versus cavernous thrombosis/infection.

EXAM:
MRI HEAD AND ORBITS WITHOUT AND WITH CONTRAST
TECHNIQUE: Multiplanar, multiecho pulse sequences of the brain and surrounding
structures were obtained without and with intravenous contrast.
Multiplanar, multiecho pulse sequences of the orbits and surrounding
structures were obtained including fat saturation techniques, before
and after intravenous contrast administration.
CONTRAST:  6mL GADAVIST GADOBUTROL 1 MMOL/ML IV SOLN

[Series 2: DWI · axial · 3.0mm · 0.94mm/px · z∈[-28,+113]mm · 6 of 96 slices shown (1 of 2)]
[im 1/96]
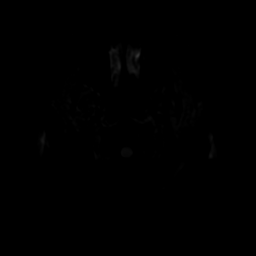
[im 20/96]
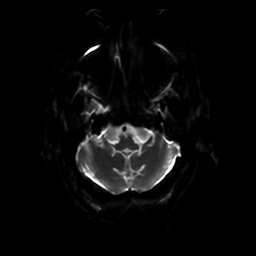
[im 39/96]
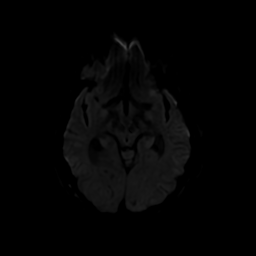
[im 58/96]
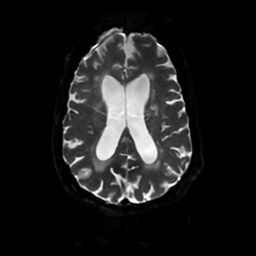
[im 77/96]
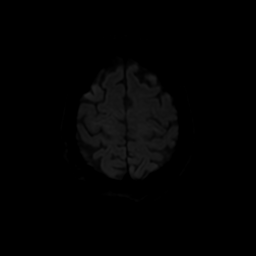
[im 96/96]
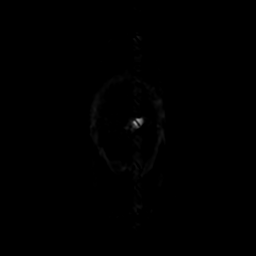

[Series 3: DWI · coronal · 4.0mm · 0.94mm/px · 5 of 69 slices shown (2 of 2)]
[im 1/69]
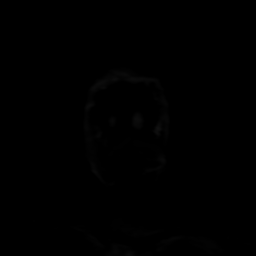
[im 18/69]
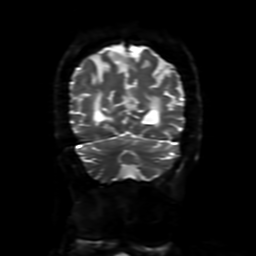
[im 35/69]
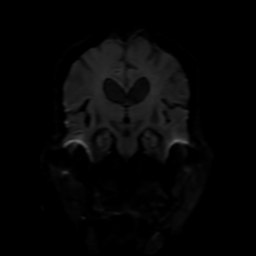
[im 52/69]
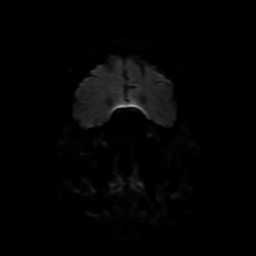
[im 69/69]
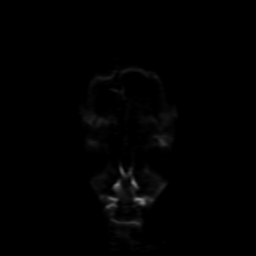

[Series 4: FLAIR · sagittal · 5.0mm · 0.23mm/px · 2 of 23 slices shown (1 of 2)]
[im 1/23]
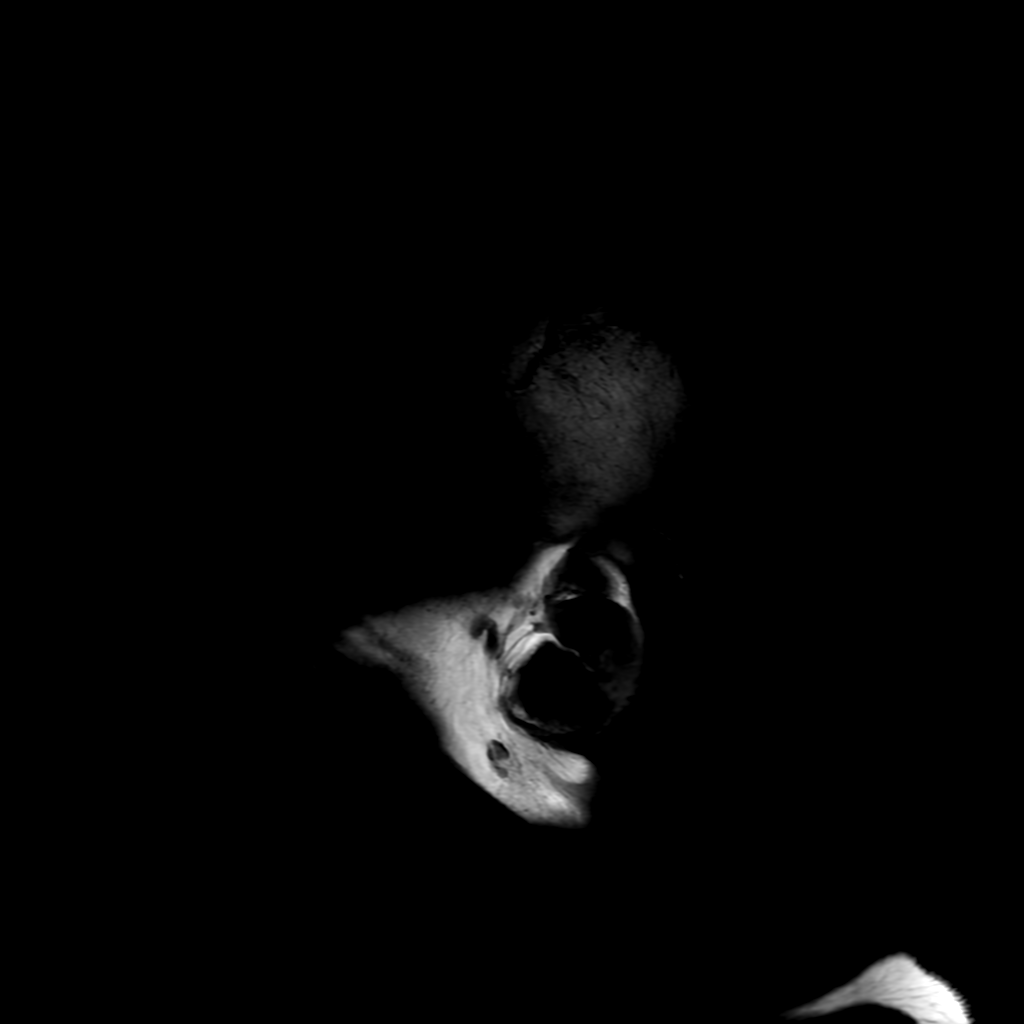
[im 23/23]
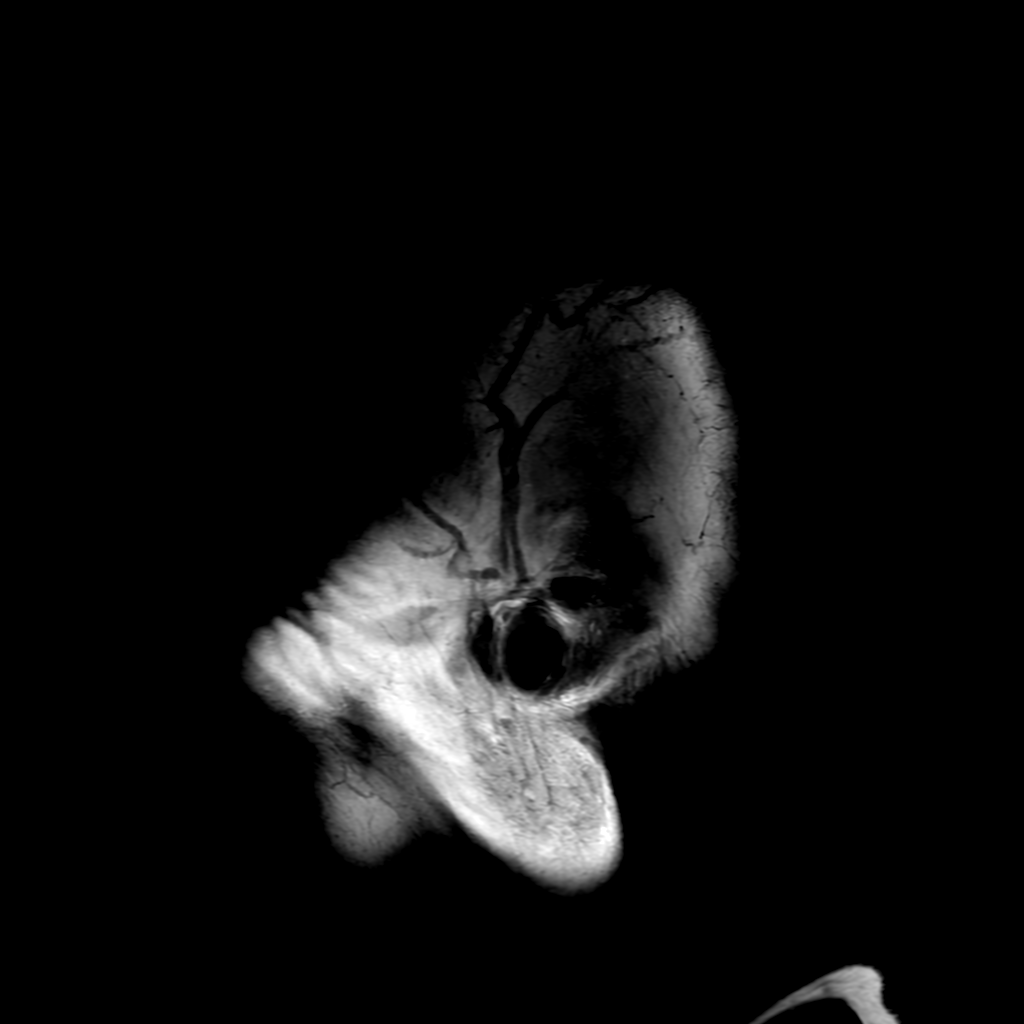

[Series 6: FLAIR · axial · 4.0mm · 0.45mm/px · z∈[-28,+113]mm · 2 of 33 slices shown (2 of 2)]
[im 1/33]
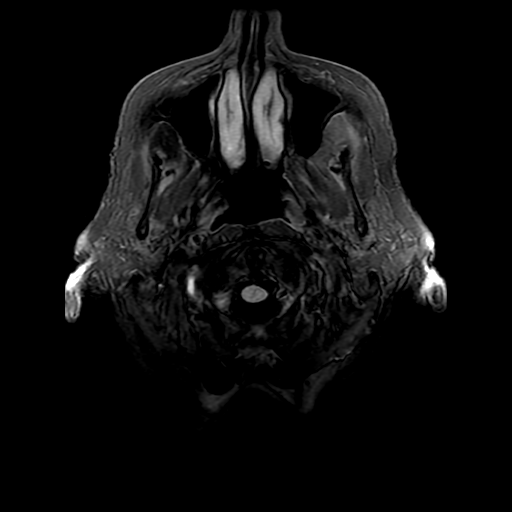
[im 33/33]
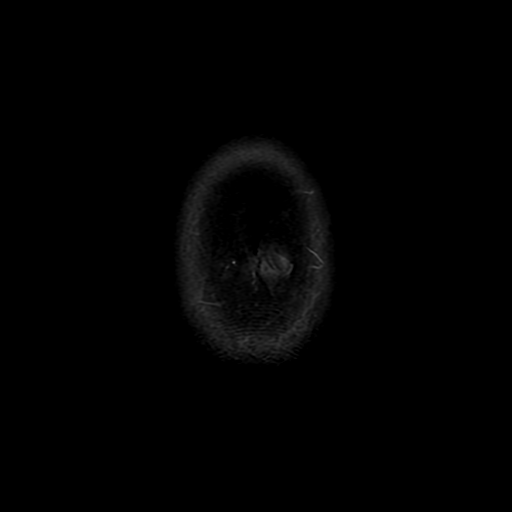

[Series 250: ADC · axial · 3.0mm · 0.94mm/px · z∈[-28,+113]mm · 3 of 48 slices shown (1 of 2)]
[im 1/48]
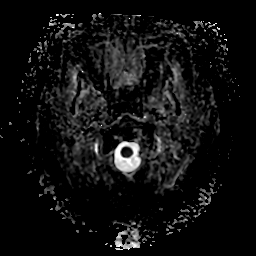
[im 24/48]
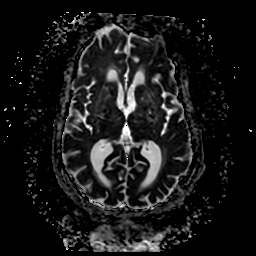
[im 48/48]
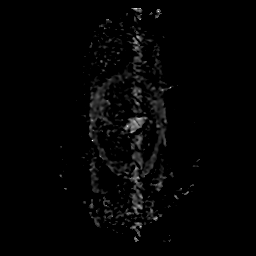

[Series 350: ADC · coronal · 4.0mm · 0.94mm/px · 2 of 35 slices shown (2 of 2)]
[im 1/35]
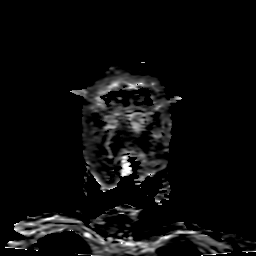
[im 35/35]
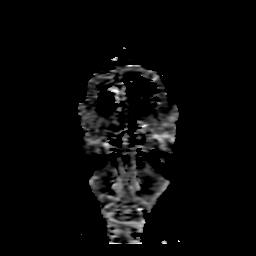

[20 of 48 positions shown; findings below may reference images not displayed]

FINDINGS: MRI HEAD FINDINGS

Mild intermittent motion degradation.

Brain:

Mild for age generalized cerebral and cerebellar atrophy.

There are two subcentimeter acute infarcts within the inferior right
cerebellar hemisphere (for instance as seen on series 2, images 8
and 9).

Chronic lacunar infarcts within the left cerebral hemispheric white
matter, bilateral basal ganglia and bilateral thalami.

Background moderate multifocal T2/FLAIR hyperintensity within the
cerebral white matter and pons, nonspecific but compatible with
chronic small vessel ischemic disease.

Chronic microhemorrhage within the periventricular left frontal lobe
(series 7, image 71).

No evidence of an intracranial mass.

No extra-axial fluid collection.

No midline shift.

Partially empty sella turcica.

No pathologic intracranial enhancement identified.

Vascular: Signal abnormality within the right vertebral artery at
the level of the foramen magnum suggesting high-grade stenosis or
vessel occlusion at this site (series 5, image 3).

Skull and upper cervical spine: No focal suspicious marrow lesion.

MRI ORBITS FINDINGS

Mild intermittent motion degradation.

Orbits: Bilateral lens replacements. The globes are normal in size
and contour. The extraocular muscles are symmetric and unremarkable.
There is abnormal enhancement along the optic nerve sheaths
bilaterally, compatible with bilateral optic nerve perineuritis. No
intraorbital mass identified.

Visualized sinuses: Trace mucosal thickening within the bilateral
ethmoid air cells.

Soft tissues: The visualized maxillofacial and upper neck soft
tissues are unremarkable.
IMPRESSION: MRI brain:

1. Mildly motion degraded exam.
2. Two subcentimeter acute infarcts within the inferior right
cerebellar hemisphere.
3. Signal abnormality within the right vertebral artery at the level
of the foramen magnum, suggesting high-grade stenosis or vessel
occlusion at this site.
4. Chronic lacunar infarcts within the left cerebral hemispheric
white matter, bilateral basal ganglia, and bilateral thalami.
Background moderate chronic small vessel ischemic changes within the
cerebral white matter, and within the pons.
5. Mild generalized parenchymal atrophy.

MRI orbits:

1. Mildly motion degraded exam.
2. Abnormal enhancement along the optic nerve sheaths bilaterally,
compatible with bilateral optic nerve perineuritis.
3. Minimal mucosal thickening within the bilateral ethmoid air
cells.

ADDENDUM:
MRI brain impressions 2 and 3, and MRI orbits impression 2 discussed
with Dr. HERTZ of the emergency department by telephone at [DATE]
p.m. on [DATE].

*** End of Addendum ***
FINDINGS: MRI HEAD FINDINGS

Mild intermittent motion degradation.

Brain:

Mild for age generalized cerebral and cerebellar atrophy.

There are two subcentimeter acute infarcts within the inferior right
cerebellar hemisphere (for instance as seen on series 2, images 8
and 9).

Chronic lacunar infarcts within the left cerebral hemispheric white
matter, bilateral basal ganglia and bilateral thalami.

Background moderate multifocal T2/FLAIR hyperintensity within the
cerebral white matter and pons, nonspecific but compatible with
chronic small vessel ischemic disease.

Chronic microhemorrhage within the periventricular left frontal lobe
(series 7, image 71).

No evidence of an intracranial mass.

No extra-axial fluid collection.

No midline shift.

Partially empty sella turcica.

No pathologic intracranial enhancement identified.

Vascular: Signal abnormality within the right vertebral artery at
the level of the foramen magnum suggesting high-grade stenosis or
vessel occlusion at this site (series 5, image 3).

Skull and upper cervical spine: No focal suspicious marrow lesion.

MRI ORBITS FINDINGS

Mild intermittent motion degradation.

Orbits: Bilateral lens replacements. The globes are normal in size
and contour. The extraocular muscles are symmetric and unremarkable.
There is abnormal enhancement along the optic nerve sheaths
bilaterally, compatible with bilateral optic nerve perineuritis. No
intraorbital mass identified.

Visualized sinuses: Trace mucosal thickening within the bilateral
ethmoid air cells.

Soft tissues: The visualized maxillofacial and upper neck soft
tissues are unremarkable.
IMPRESSION: MRI brain:

1. Mildly motion degraded exam.
2. Two subcentimeter acute infarcts within the inferior right
cerebellar hemisphere.
3. Signal abnormality within the right vertebral artery at the level
of the foramen magnum, suggesting high-grade stenosis or vessel
occlusion at this site.
4. Chronic lacunar infarcts within the left cerebral hemispheric
white matter, bilateral basal ganglia, and bilateral thalami.
Background moderate chronic small vessel ischemic changes within the
cerebral white matter, and within the pons.
5. Mild generalized parenchymal atrophy.

MRI orbits:

1. Mildly motion degraded exam.
2. Abnormal enhancement along the optic nerve sheaths bilaterally,
compatible with bilateral optic nerve perineuritis.
3. Minimal mucosal thickening within the bilateral ethmoid air
cells.

## 2020-11-08 IMAGING — CT CT ANGIO HEAD-NECK (W OR W/O PERF)
1 of 11 series · 14 of 47 positions shown · IV contrast (omnipaque)
Comparison: MRI of the same day.  CT head from [DATE].

CLINICAL DATA: Stroke/TIA, assess intracranial arteries

EXAM:
CT ANGIOGRAPHY HEAD AND NECK
TECHNIQUE: Multidetector CT imaging of the head and neck was performed using
the standard protocol during bolus administration of intravenous
contrast. Multiplanar CT image reconstructions and MIPs were
obtained to evaluate the vascular anatomy. Carotid stenosis
measurements (when applicable) are obtained utilizing NASCET
criteria, using the distal internal carotid diameter as the
denominator.
CONTRAST:  75mL OMNIPAQUE IOHEXOL 350 MG/ML SOLN

[Series 16: thin · axial · 0.52mm/px · z∈[-313,-15]mm · 14 of 671 slices shown]
[im 45/671  brain]
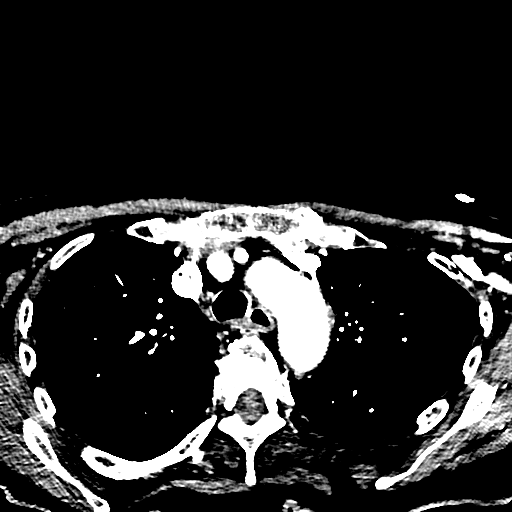
[im 90/671  bone]
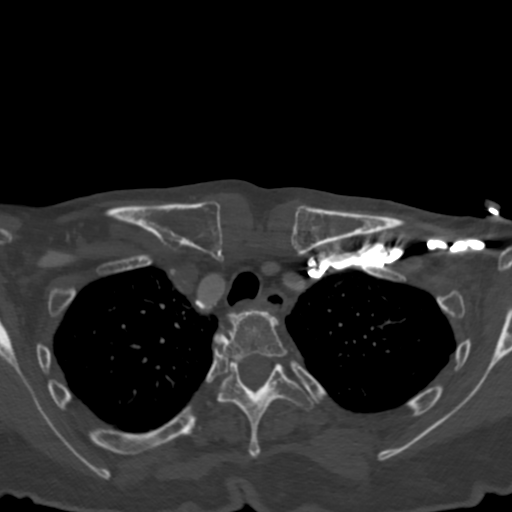
[im 135/671  brain]
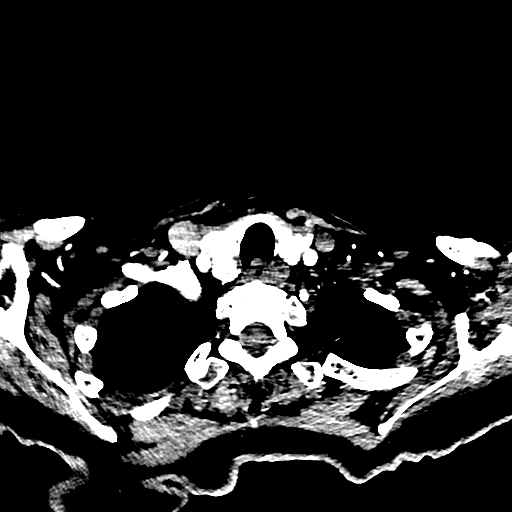
[im 179/671  bone]
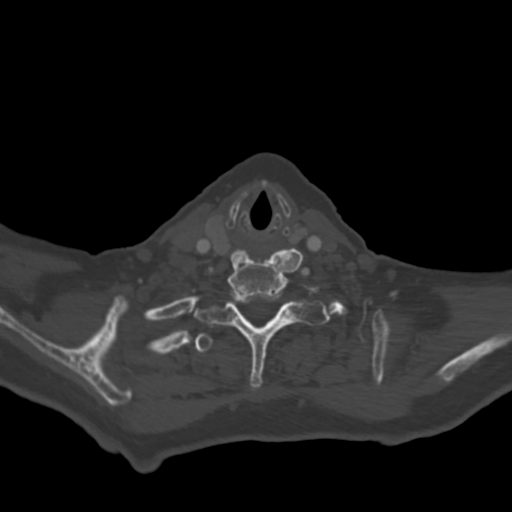
[im 224/671  brain]
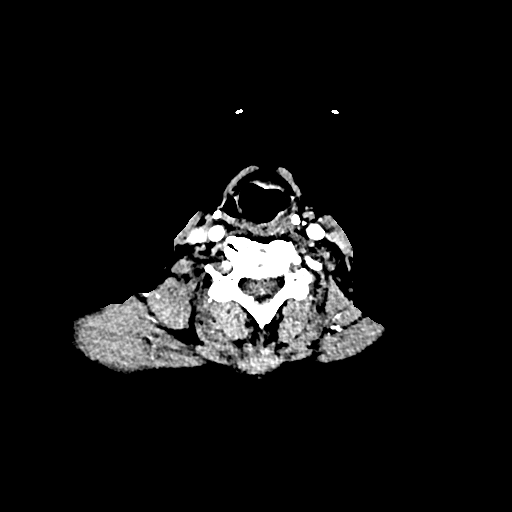
[im 269/671  bone]
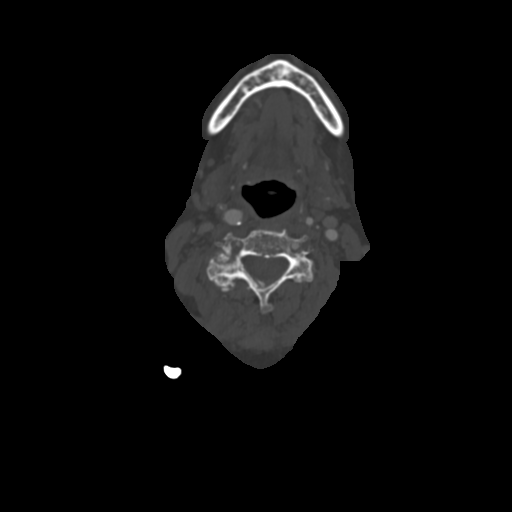
[im 313/671  brain]
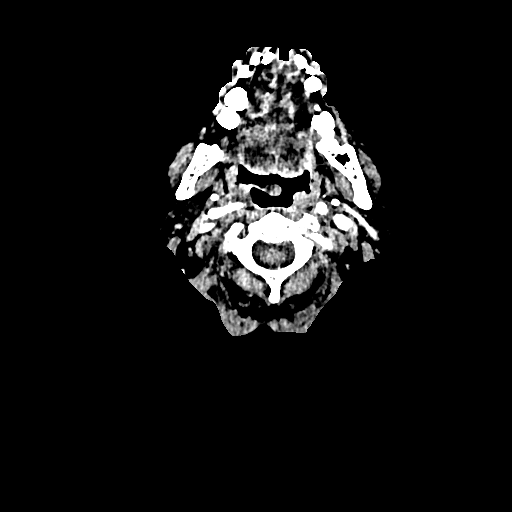
[im 358/671  bone]
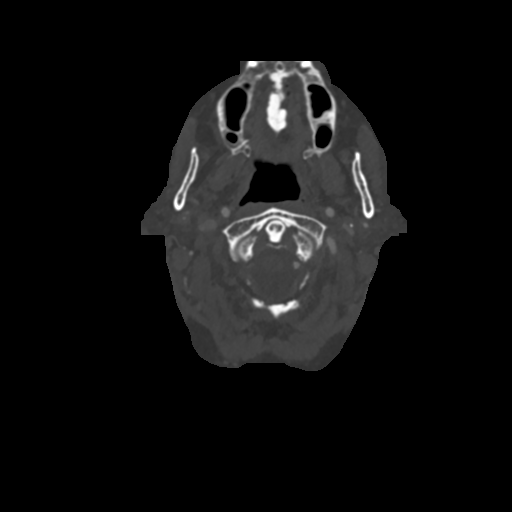
[im 403/671  brain]
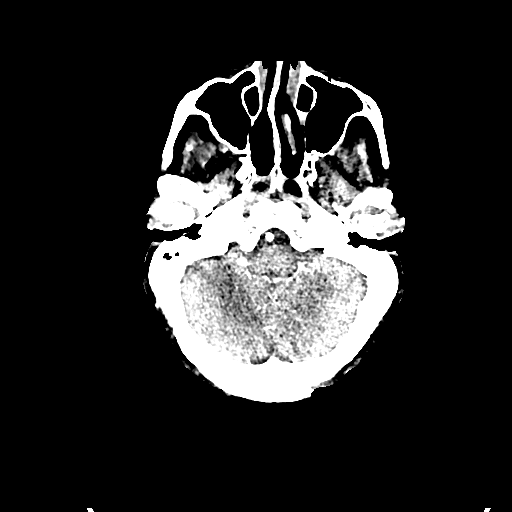
[im 447/671  bone]
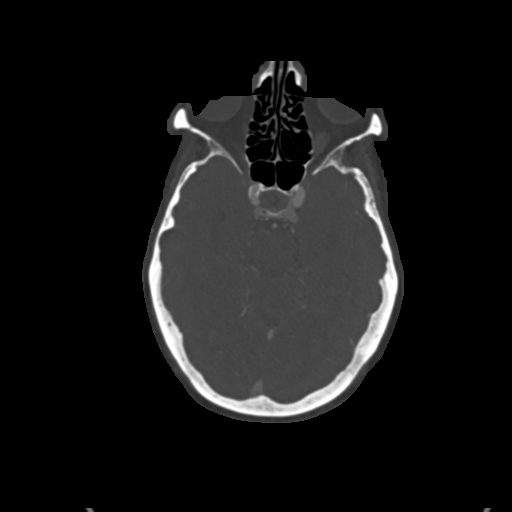
[im 492/671  brain]
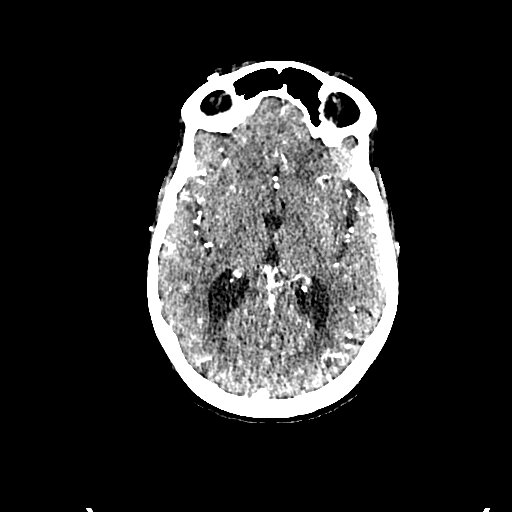
[im 537/671  bone]
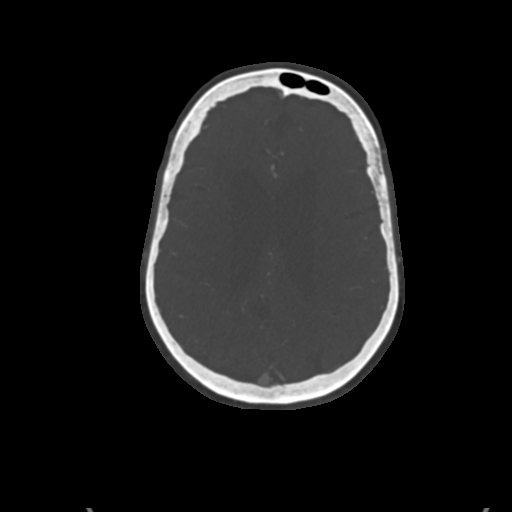
[im 581/671  brain]
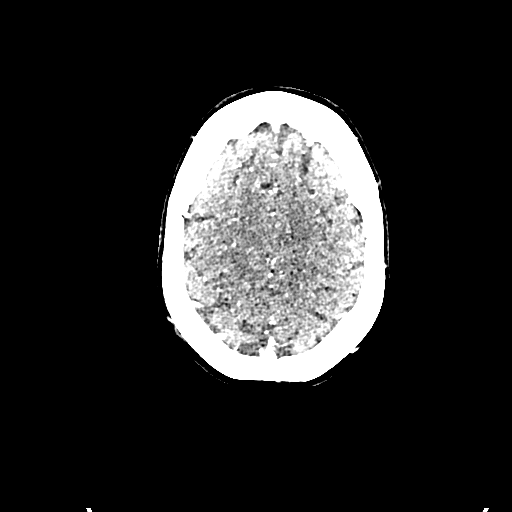
[im 626/671  bone]
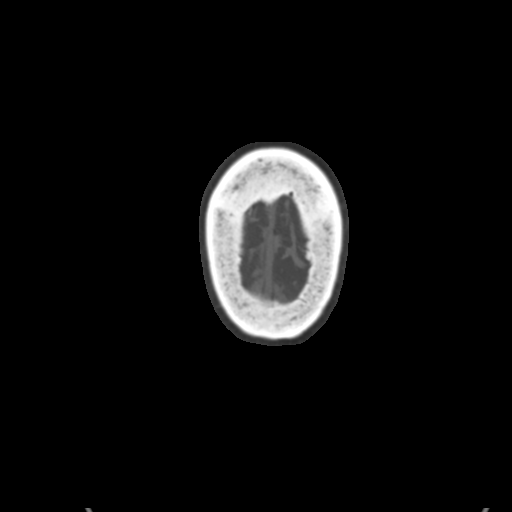

[14 of 47 positions shown; findings below may reference images not displayed]

FINDINGS: CT HEAD FINDINGS

Brain: Small right cerebellar infarcts better characterized on
same-day MRI. No evidence of interval acute large vascular territory
infarct, acute hemorrhage, mass lesion, midline shift,
hydrocephalus, or extra-axial fluid collection. Small remote lacunar
infarcts also better seen on prior MRI. Patchy white matter
hypoattenuation, nonspecific but compatible with chronic
microvascular ischemic disease

Vascular: See below

Skull: No acute fracture.

Sinuses: Clear sinuses.

Orbits: See same day MRI orbits for more sensitive evaluation.

Review of the MIP images confirms the above findings

CTA NECK FINDINGS

Aortic arch: Atherosclerosis.  Great vessel origins are patent.

Right carotid system: Mild atherosclerosis without significant
(greater than 50%) stenosis. Retropharyngeal course of the ICA.

Left carotid system: Moderate atherosclerosis at the carotid
bifurcation without greater than 50% stenosis.

Vertebral arteries: Multifocal severe stenosis of the right
vertebral artery with occlusion at the C2-C3 level.

Skeleton:

Other neck: No acute abnormality.

Upper chest: Biapical pleural-parenchymal scarring. Otherwise,
visualized lung apices are clear.

Review of the MIP images confirms the above findings

CTA HEAD FINDINGS

Anterior circulation: Bilateral intracranial ICAs are patent with
mild narrowing due to calcific atherosclerosis. Severe narrowing of
the distal M1 and proximal M2 MCA branches bilaterally. Bilateral A1
ACAs are patent. Severe stenosis of the left A2 ACA. Mild right A2
ACA stenosis.

Posterior circulation: Irregular opacification of a small right
V3/V4 vertebral artery from muscular collaterals. Moderate stenosis
of the proximal left intradural vertebral artery. Severe stenosis of
the mid and distal basilar artery. Severe stenosis of the right P2
PCA. Small left P1 PCA with severe stenosis of the proximal left P2
PCA.

Venous sinuses: As permitted by contrast timing, patent.

Review of the MIP images confirms the above findings
IMPRESSION: CT head:

1. Small inferior right cerebellar infarcts better seen on same-day
MRI.
2. No evidence of interval acute abnormality.

CTA:

1. Severe stenosis of the mid and distal basilar artery and
bilateral P2 PCAs.
2. Severe stenosis of the distal M1 and proximal M2 MCA branches
bilaterally.
3. Severe stenosis of the left A2 ACA.
4. Multifocal severe stenosis of the right vertebral artery in the
neck with occlusion at the C2-C3 level and irregular opacification
of a small V3/V4 vertebral artery from collaterals.
5. Moderate stenosis of the intradural left vertebral artery.
6. Bilateral carotid bifurcation atherosclerosis without greater
than 50% stenosis.

## 2020-11-08 MED ORDER — CEFAZOLIN SODIUM-DEXTROSE 2-4 GM/100ML-% IV SOLN
2.0000 g | INTRAVENOUS | Status: AC
  Administered 2020-11-09: 2 g via INTRAVENOUS
  Filled 2020-11-08: qty 100

## 2020-11-08 MED ORDER — SODIUM CHLORIDE 0.9% FLUSH
3.0000 mL | Freq: Two times a day (BID) | INTRAVENOUS | Status: DC
  Administered 2020-11-08 – 2020-11-18 (×19): 3 mL via INTRAVENOUS

## 2020-11-08 MED ORDER — PRAVASTATIN SODIUM 40 MG PO TABS
40.0000 mg | ORAL_TABLET | Freq: Every day | ORAL | Status: DC
  Administered 2020-11-08: 40 mg via ORAL
  Filled 2020-11-08 (×2): qty 1

## 2020-11-08 MED ORDER — LINAGLIPTIN 5 MG PO TABS
5.0000 mg | ORAL_TABLET | Freq: Every day | ORAL | Status: DC
  Filled 2020-11-08: qty 1

## 2020-11-08 MED ORDER — ONDANSETRON HCL 4 MG/2ML IJ SOLN
4.0000 mg | Freq: Four times a day (QID) | INTRAMUSCULAR | Status: DC | PRN
  Administered 2020-11-08 – 2020-11-09 (×2): 4 mg via INTRAVENOUS
  Filled 2020-11-08 (×3): qty 2

## 2020-11-08 MED ORDER — BISACODYL 5 MG PO TBEC
5.0000 mg | DELAYED_RELEASE_TABLET | Freq: Every day | ORAL | Status: DC | PRN
  Administered 2020-11-15: 5 mg via ORAL
  Filled 2020-11-08: qty 1

## 2020-11-08 MED ORDER — ONDANSETRON HCL 4 MG PO TABS: 4.0000 mg | ORAL_TABLET | Freq: Four times a day (QID) | ORAL | Status: DC | PRN

## 2020-11-08 MED ORDER — GADOBUTROL 1 MMOL/ML IV SOLN
6.0000 mL | Freq: Once | INTRAVENOUS | Status: AC | PRN
  Administered 2020-11-08: 6 mL via INTRAVENOUS

## 2020-11-08 MED ORDER — IOHEXOL 350 MG/ML SOLN
75.0000 mL | Freq: Once | INTRAVENOUS | Status: AC | PRN
  Administered 2020-11-08: 75 mL via INTRAVENOUS

## 2020-11-08 MED ORDER — AMLODIPINE BESYLATE 5 MG PO TABS
5.0000 mg | ORAL_TABLET | Freq: Every day | ORAL | Status: DC
  Administered 2020-11-08 – 2020-11-09 (×2): 5 mg via ORAL
  Filled 2020-11-08 (×3): qty 1

## 2020-11-08 MED ORDER — HYDRALAZINE HCL 20 MG/ML IJ SOLN: 5.0000 mg | INTRAMUSCULAR | Status: DC | PRN

## 2020-11-08 MED ORDER — ACETAMINOPHEN 325 MG PO TABS
650.0000 mg | ORAL_TABLET | Freq: Four times a day (QID) | ORAL | Status: DC | PRN
  Administered 2020-11-10: 650 mg via ORAL
  Administered 2020-11-13: 325 mg via ORAL
  Filled 2020-11-08: qty 2

## 2020-11-08 MED ORDER — ACETAMINOPHEN 325 MG PO TABS: 325.0000 mg | ORAL_TABLET | Freq: Four times a day (QID) | ORAL | Status: DC | PRN

## 2020-11-08 MED ORDER — CLOPIDOGREL BISULFATE 300 MG PO TABS
300.0000 mg | ORAL_TABLET | Freq: Once | ORAL | Status: AC
  Administered 2020-11-08: 300 mg via ORAL
  Filled 2020-11-08: qty 1

## 2020-11-08 MED ORDER — ASPIRIN EC 81 MG PO TBEC: 81.0000 mg | DELAYED_RELEASE_TABLET | Freq: Every day | ORAL | Status: DC

## 2020-11-08 MED ORDER — ASPIRIN 325 MG PO TABS
325.0000 mg | ORAL_TABLET | Freq: Once | ORAL | Status: AC
  Administered 2020-11-08: 325 mg via ORAL
  Filled 2020-11-08: qty 1

## 2020-11-08 MED ORDER — ACETAMINOPHEN 650 MG RE SUPP: 650.0000 mg | Freq: Four times a day (QID) | RECTAL | Status: DC | PRN

## 2020-11-08 MED ORDER — MORPHINE SULFATE (PF) 2 MG/ML IV SOLN: 2.0000 mg | INTRAVENOUS | Status: DC | PRN

## 2020-11-08 MED ORDER — INSULIN ASPART 100 UNIT/ML IJ SOLN
0.0000 [IU] | Freq: Three times a day (TID) | INTRAMUSCULAR | Status: DC
  Administered 2020-11-09: 8 [IU] via SUBCUTANEOUS
  Administered 2020-11-09: 3 [IU] via SUBCUTANEOUS
  Administered 2020-11-10 (×2): 8 [IU] via SUBCUTANEOUS
  Administered 2020-11-10 – 2020-11-11 (×2): 5 [IU] via SUBCUTANEOUS
  Administered 2020-11-11: 3 [IU] via SUBCUTANEOUS
  Administered 2020-11-11: 8 [IU] via SUBCUTANEOUS
  Administered 2020-11-12 – 2020-11-13 (×4): 2 [IU] via SUBCUTANEOUS
  Administered 2020-11-13: 8 [IU] via SUBCUTANEOUS
  Administered 2020-11-13: 3 [IU] via SUBCUTANEOUS
  Administered 2020-11-14: 5 [IU] via SUBCUTANEOUS
  Administered 2020-11-14: 11 [IU] via SUBCUTANEOUS
  Administered 2020-11-15: 3 [IU] via SUBCUTANEOUS
  Administered 2020-11-15: 2 [IU] via SUBCUTANEOUS
  Administered 2020-11-16: 5 [IU] via SUBCUTANEOUS
  Administered 2020-11-16: 8 [IU] via SUBCUTANEOUS
  Administered 2020-11-17: 3 [IU] via SUBCUTANEOUS
  Administered 2020-11-17: 8 [IU] via SUBCUTANEOUS
  Administered 2020-11-18: 11 [IU] via SUBCUTANEOUS

## 2020-11-08 MED ORDER — POLYETHYLENE GLYCOL 3350 17 G PO PACK: 17.0000 g | PACK | Freq: Every day | ORAL | Status: DC | PRN

## 2020-11-08 MED ORDER — DORZOLAMIDE HCL 2 % OP SOLN
1.0000 [drp] | Freq: Two times a day (BID) | OPHTHALMIC | Status: DC
  Administered 2020-11-08 – 2020-11-18 (×18): 1 [drp] via OPHTHALMIC
  Filled 2020-11-08 (×2): qty 10

## 2020-11-08 MED ORDER — METFORMIN HCL 500 MG PO TABS
1000.0000 mg | ORAL_TABLET | Freq: Two times a day (BID) | ORAL | Status: DC
  Administered 2020-11-08: 1000 mg via ORAL
  Filled 2020-11-08: qty 2

## 2020-11-08 MED ORDER — CLOPIDOGREL BISULFATE 75 MG PO TABS
75.0000 mg | ORAL_TABLET | Freq: Every day | ORAL | Status: DC
  Administered 2020-11-09 – 2020-11-18 (×10): 75 mg via ORAL
  Filled 2020-11-08 (×10): qty 1

## 2020-11-08 MED ORDER — ENOXAPARIN SODIUM 40 MG/0.4ML IJ SOSY
40.0000 mg | PREFILLED_SYRINGE | Freq: Every day | INTRAMUSCULAR | Status: DC
  Administered 2020-11-08 – 2020-11-18 (×11): 40 mg via SUBCUTANEOUS
  Filled 2020-11-08 (×11): qty 0.4

## 2020-11-08 MED ORDER — HYDROCODONE-ACETAMINOPHEN 5-325 MG PO TABS
1.0000 | ORAL_TABLET | ORAL | Status: DC | PRN
  Administered 2020-11-08: 2 via ORAL
  Administered 2020-11-10: 1 via ORAL
  Administered 2020-11-16: 2 via ORAL
  Filled 2020-11-08 (×2): qty 2
  Filled 2020-11-08: qty 1

## 2020-11-08 MED ORDER — ASPIRIN EC 325 MG PO TBEC
325.0000 mg | DELAYED_RELEASE_TABLET | Freq: Every day | ORAL | Status: DC
  Administered 2020-11-09 – 2020-11-14 (×6): 325 mg via ORAL
  Filled 2020-11-08 (×6): qty 1

## 2020-11-08 MED ORDER — DOCUSATE SODIUM 100 MG PO CAPS
100.0000 mg | ORAL_CAPSULE | Freq: Two times a day (BID) | ORAL | Status: DC
  Administered 2020-11-08 – 2020-11-18 (×20): 100 mg via ORAL
  Filled 2020-11-08 (×20): qty 1

## 2020-11-08 MED ORDER — SODIUM CHLORIDE 0.9 % IV SOLN
1000.0000 mg | Freq: Once | INTRAVENOUS | Status: AC
  Administered 2020-11-08: 1000 mg via INTRAVENOUS
  Filled 2020-11-08: qty 16

## 2020-11-08 MED ORDER — SODIUM CHLORIDE 0.9 % IV SOLN
1000.0000 mg | Freq: Every day | INTRAVENOUS | Status: AC
  Administered 2020-11-09 – 2020-11-10 (×2): 1000 mg via INTRAVENOUS
  Filled 2020-11-08 (×2): qty 16

## 2020-11-08 MED ORDER — LACTATED RINGERS IV SOLN: INTRAVENOUS | Status: DC

## 2020-11-08 NOTE — ED Notes (Signed)
Pt to CT

## 2020-11-08 NOTE — Consult Note (Signed)
Neurology Consultation  Reason for Consult: Headache, right eye vision loss Referring Physician: Dr. Lorin Mercy  CC: Right eye vision loss  History is obtained from: Chart review, Patient, Patient's daughter at bedside  HPI: Jenna Long is a 86 y.o. female with a medical history significant for type 2 diabetes mellitus, essential hypertension, and hypercholesteremia who presented to the ED for evaluation of right eye vision loss. Patient states that about 1.5 months ago, she began to notice inner ear and sinus problems but that her inner ear problems went away and her sinus congestion on her left face began to feel better. She noted that the sinus congestion recurred approximately 2 weeks ago with associated temporal pulsatile discomfort and visual disturbance described as a film over her right eye with right eye stinging and irritation over the past 5 days. Due to progressive loss of vision in the right eye to complete vision loss, she saw her optometrist yesterday afternoon. After evaluation, he recommended that she be seen by Dr. Katy Fitch for evaluation but she was unable to make the appointment on time and she instead came to the ED for further evaluation. Patient also notes that she has had poor PO intake and she has not been able to hold food down at home. She also states that she tried eating her lunch while in the hospital but vomited it up immediately. Work up in the ED revealed a CRP of 8.6 with a sed rate of 111. Imaging revealed two incidental acute right cerebellar infarctions, signal abnormality within the right vertebral artery suggestive of high-grade stenosis or vessel occlusion, and abnormal enhancement along the optic nerve sheaths compatible with bilateral optic nerve perineuritis and neurology was consulted for further evaluation and management.   ROS: A complete ROS was performed and is negative except as noted in the HPI.   Past Medical History:  Diagnosis Date   Diabetes mellitus  type II    DNR (do not resuscitate) 11/08/2020   Hypercholesteremia    Hypertension    Past Surgical History:  Procedure Laterality Date   ABDOMINAL HYSTERECTOMY  1991   [facility], [place]   ANKLE SURGERY  1989   left, [place]   CATARACT EXTRACTION W/ INTRAOCULAR LENS IMPLANT  06/10/06    Right, [facility], [place]   CATARACT EXTRACTION W/PHACO  09/18/2010   Procedure: CATARACT EXTRACTION PHACO AND INTRAOCULAR LENS PLACEMENT (IOC);  Surgeon: Williams Che;  Location: [facility];  Service: Ophthalmology;  Laterality: Left;   COLONOSCOPY W/ POLYPECTOMY  06/20/10   [facility], [place]   HIP ARTHROPLASTY Left 02/18/2020   Procedure: ARTHROPLASTY BIPOLAR HIP (HEMIARTHROPLASTY);  Surgeon: Mordecai Rasmussen, MD;  Location: [facility];  Service: Orthopedics;  Laterality: Left;   Family History  Problem Relation Age of Onset   Anesthesia problems Neg Hx    Social History:   reports that she has never smoked. She has never used smokeless tobacco. She reports that she does not drink alcohol and does not use drugs.  Medications  Current Facility-Administered Medications:    acetaminophen (TYLENOL) tablet 650 mg, 650 mg, Oral, Q6H PRN **OR** acetaminophen (TYLENOL) suppository 650 mg, 650 mg, Rectal, Q6H PRN, Karmen Bongo, MD   amLODipine (NORVASC) tablet 5 mg, 5 mg, Oral, Daily, Karmen Bongo, MD, 5 mg at 11/08/20 0914   bisacodyl (DULCOLAX) EC tablet 5 mg, 5 mg, Oral, Daily PRN, Karmen Bongo, MD   docusate sodium (COLACE) capsule 100 mg, 100 mg, Oral, BID, Karmen Bongo, MD   dorzolamide (TRUSOPT) 2 %  ophthalmic solution 1 drop, 1 drop, Both Eyes, BID, Karmen Bongo, MD   enoxaparin (LOVENOX) injection 40 mg, 40 mg, Subcutaneous, Daily, Karmen Bongo, MD, 40 mg at 11/08/20 1337   hydrALAZINE (APRESOLINE) injection 5 mg, 5 mg, Intravenous, Q4H PRN, Karmen Bongo, MD   HYDROcodone-acetaminophen (NORCO/VICODIN) 5-325 MG per tablet 1-2 tablet, 1-2 tablet, Oral, Q4H PRN, Karmen Bongo, MD   insulin aspart  (novoLOG) injection 0-15 Units, 0-15 Units, Subcutaneous, TID WC, Karmen Bongo, MD   Derrill Memo ON 11/09/2020] lactated ringers infusion, , Intravenous, Continuous, Karmen Bongo, MD   Derrill Memo ON 11/09/2020] methylPREDNISolone sodium succinate (SOLU-MEDROL) 1,000 mg in sodium chloride 0.9 % 50 mL IVPB, 1,000 mg, Intravenous, Daily, Karmen Bongo, MD   morphine 2 MG/ML injection 2 mg, 2 mg, Intravenous, Q2H PRN, Karmen Bongo, MD   ondansetron [facility]) tablet 4 mg, 4 mg, Oral, Q6H PRN **OR** ondansetron (ZOFRAN) injection 4 mg, 4 mg, Intravenous, Q6H PRN, Karmen Bongo, MD, 4 mg at 11/08/20 1335   polyethylene glycol (MIRALAX / GLYCOLAX) packet 17 g, 17 g, Oral, Daily PRN, Karmen Bongo, MD   pravastatin (PRAVACHOL) tablet 40 mg, 40 mg, Oral, QHS, Karmen Bongo, MD   sodium chloride flush (NS) 0.9 % injection 3 mL, 3 mL, Intravenous, Q12H, Karmen Bongo, MD, 3 mL at 11/08/20 1339  Current Outpatient Medications:    acetaminophen (TYLENOL) 325 MG tablet, Take 325-650 mg by mouth every 6 (six) hours as needed for moderate pain or headache., Disp: , Rfl:    amLODipine (NORVASC) 5 MG tablet, Take 5 mg by mouth daily., Disp: , Rfl:    dorzolamide (TRUSOPT) 2 % ophthalmic solution, Place 1 drop into both eyes 2 (two) times daily., Disp: , Rfl:    JANUVIA 100 MG tablet, Take 100 mg by mouth daily., Disp: , Rfl:    metFORMIN (GLUCOPHAGE) 1000 MG tablet, Take 1,000 mg by mouth 2 (two) times daily with a meal., Disp: , Rfl:    Multiple Vitamin (MULTIVITAMIN) tablet, Take 1 tablet by mouth daily., Disp: , Rfl:    pravastatin (PRAVACHOL) 40 MG tablet, Take 40 mg by mouth at bedtime., Disp: , Rfl:   Exam: Current vital signs: BP (!) 145/70   Pulse 86   Temp (!) 97.4 F (36.3 C)   Resp 19   SpO2 98%  Vital signs in last 24 hours: Temp:  [97.4 F (36.3 C)-98.5 F (36.9 C)] 97.4 F (36.3 C) (09/20 0754) Pulse Rate:  [73-87] 86 (09/20 1600) Resp:  [15-26] 19 (09/20 1600) BP:  (109-182)/(58-104) 145/70 (09/20 1600) SpO2:  [92 %-100 %] 98 % (09/20 1600)  GENERAL: Awake, alert, in no acute distress Psych: Affect appropriate for situation, patient is calm and cooperative with examination Head: Normocephalic and atraumatic, without obvious abnormality EENT: Normal conjunctivae, dry mucous membranes, no OP obstruction. Temporal area is not tender to palpation though, temporal arteries are firm to palpation.  LUNGS: Normal respiratory effort. Non-labored breathing on room air CV: Regular rate and rhythm on telemetry ABDOMEN: Soft, non-tender, non-distended Extremities: warm, well perfused, without obvious deformity  NEURO:  Mental Status: Awake, alert, and oriented to person, place, time, and situation. She is able to provide a clear and coherent history of present illness. Speech/Language: speech is dysarthric at baseline.  Naming, repetition, fluency, and comprehension intact without aphasia. No neglect is noted. Cranial Nerves:  II: PERRL. visual fields full on the left, right eye complete vision loss  III, IV, VI: Patient fixate and tracks examiner in all visual fields  V: Sensation is intact to light touch and symmetrical to face.  VII: Face is symmetric resting and smiling.  VIII: Hearing is intact to voice IX, X: Phonation normal.  XI: Normal sternocleidomastoid and trapezius muscle strength XII: Tongue protrudes midline without fasciculations.   Motor: Patient moves all extremities well with spontaneous and antigravity movement.   Tone is normal. Bulk is normal.  Sensation: Intact to light touch bilaterally in all four extremities.  Gait: Deferred  NIHSS of 4; dysarthria-1, right eye complete vision loss- 3  Labs I have reviewed labs in epic and the results pertinent to this consultation are: CBC    Component Value Date/Time   WBC 6.7 11/07/2020 2046   RBC 3.59 (L) 11/07/2020 2046   HGB 10.9 (L) 11/07/2020 2137   HCT 32.0 (L) 11/07/2020 2137    PLT 516 (H) 11/07/2020 2046   MCV 89.7 11/07/2020 2046   MCH 27.9 11/07/2020 2046   MCHC 31.1 11/07/2020 2046   RDW 14.6 11/07/2020 2046   LYMPHSABS 1.9 11/07/2020 2046   MONOABS 0.6 11/07/2020 2046   EOSABS 0.0 11/07/2020 2046   BASOSABS 0.0 11/07/2020 2046   CMP     Component Value Date/Time   NA 136 11/07/2020 2137   K 4.3 11/07/2020 2137   CL 100 11/07/2020 2137   CO2 24 11/07/2020 2046   GLUCOSE 207 (H) 11/07/2020 2137   BUN 19 11/07/2020 2137   CREATININE 0.80 11/07/2020 2137   CALCIUM 9.3 11/07/2020 2046   PROT 8.0 11/07/2020 2046   ALBUMIN 2.9 (L) 11/07/2020 2046   AST 13 (L) 11/07/2020 2046   ALT 13 11/07/2020 2046   ALKPHOS 81 11/07/2020 2046   BILITOT 0.5 11/07/2020 2046   GFRNONAA >60 11/07/2020 2046   GFRAA >60 09/12/2010 1040   Lipid Panel  No results found for: CHOL, TRIG, HDL, CHOLHDL, VLDL, LDLCALC, LDLDIRECT  Lab Results  Component Value Date   HGBA1C 6.3 (H) 11/08/2020   Lab Results  Component Value Date   CRP 8.6 (H) 11/08/2020   Erythrocyte Sedimentation Rate     Component Value Date/Time   ESRSEDRATE 111 (H) 11/08/2020 0240   Imaging I have reviewed the images obtained:  MRI brain: 1. Mildly motion degraded exam. 2. Two subcentimeter acute infarcts within the inferior right cerebellar hemisphere. 3. Signal abnormality within the right vertebral artery at the level of the foramen magnum, suggesting high-grade stenosis or vessel occlusion at this site. 4. Chronic lacunar infarcts within the left cerebral hemispheric white matter, bilateral basal ganglia, and bilateral thalami. Background moderate chronic small vessel ischemic changes within the cerebral white matter, and within the pons. 5. Mild generalized parenchymal atrophy.   MRI orbits: 1. Mildly motion degraded exam. 2. Abnormal enhancement along the optic nerve sheaths bilaterally, compatible with bilateral optic nerve perineuritis. 3. Minimal mucosal thickening within the  bilateral ethmoid air cells.  Assessment: 86 y.o. female who presented to the ED for evaluation of right eye vision loss and 5 days of right visual disturbance progressing to right eye vision loss with associated temporal pulsatile discomfort.  - Work up in the ED revealed markedly elevated ESR and CRP with firm temporal arteries to palpation but without complaints of current headache. Imaging revealed abnormal enhancement along the optic sheaths bilaterally compatible with bilateral optic nerve perineuritis. Imaging also revealed two subcentimeter acute infarcts within the inferior right cerebellar hemisphere with signal abnormality within the right vertebral artery suggesting high-grade stenosis versus vessel occlusion.  - Presentation is felt to  be most consistent with Giant Cell Arteritis with vision loss, temporal pulsatile discomfort, and markedly elevated inflammatory markers. Temporal artery biopsy pending per vascular team tomorrow.  - Stroke risk factors include patient's advanced age, DM2, HTN, HLD, and chronic lacunar infarcts noted on MRI imaging.   Impression: Right eye vision loss with temporal discomfort; likely GCA Incidental right cerebellar acute infarctions; likely stenosis versus occlusion of right vertebral artery  Recommendations: # Concern for GCA - Agree with Solu-Medrol therapy 1 g IV x 3 days and will need steroid taper after. - PPI with steroid treatment - Temporal artery biopsy planned for tomorrow per vascular team - PT/OT consults - Will need steroid taper at discharge  # Incidental acute right cerebellar stroke - Fasting lipid panel with goal LDL < 70  - CT angio head and neck - Frequent neuro checks - Echocardiogram - Prophylactic therapy- Antiplatelet med: Aspirin - dose 367m PO or 3035mPR followed by ASA 81 mg PO daily in addition to clopidogrel 300 mg once and 75 mg PO daily x 21 days - Risk factor modification - Telemetry monitoring - PT consult, OT  consult, Speech consult - Stroke team to follow  StAnibal HendersonAGAC-NP [facility] Pager: [phone] attestation:  Patient seen, examined, labs,vitals and notes reviewed. Discussed plan with StAnibal HendersonNP and agree with assessment and plan as documented above. I have independently reviewed the chart, obtained history, review of systems and examined the patient.  The patient denies bilateral shoulder and hip pain and therefore does not meet criteria for polymyalgia rheumatica.  Electronically signed by:  HuLynnae SandhoffMD Page: [phone]/20/2022, 5:56 PM

## 2020-11-08 NOTE — ED Provider Notes (Signed)
8:02 AM Received a call from Dr. Katy Fitch, Ophthalmology, regarding this patient who is still in the lobby. Her inflammatory markers are elevated and he is concerned about giant cell arteritis. She has already lost vision in one eye. Charge nurse aware of the time sensitive nature of this patient's presentation and will work to get her a room ASAP. High dose steroids ordered.    Truddie Hidden, MD 11/08/20 [phone]

## 2020-11-08 NOTE — H&P (View-Only) (Signed)
Patient name: Jenna Long MRN: [id number] DOB: 06/14/1934 Sex: female  REASON FOR CONSULT:   For temporal artery biopsy.  The consult is requested by Dr. Karmen Bongo.  HPI:   Jenna Long is a pleasant 86 y.o. female who developed gradual loss of vision in her right eye several days ago.  This is progressed.  She also has been having headaches.  Her sed rate is markedly elevated at 111.  Vascular surgery was consulted for a temporal artery biopsy.  Current Facility-Administered Medications  Medication Dose Route Frequency Provider Last Rate Last Admin   acetaminophen (TYLENOL) tablet 650 mg  650 mg Oral Q6H PRN Karmen Bongo, MD       Or   acetaminophen (TYLENOL) suppository 650 mg  650 mg Rectal Q6H PRN Karmen Bongo, MD       amLODipine (NORVASC) tablet 5 mg  5 mg Oral Daily Karmen Bongo, MD   5 mg at 11/08/20 3244   aspirin tablet 325 mg  325 mg Oral Once Rikki Spearing, NP       Followed by   Derrill Memo ON 11/09/2020] aspirin EC tablet 81 mg  81 mg Oral Daily Rikki Spearing, NP       bisacodyl (DULCOLAX) EC tablet 5 mg  5 mg Oral Daily PRN Karmen Bongo, MD       clopidogrel (PLAVIX) tablet 300 mg  300 mg Oral Once Rikki Spearing, NP       Followed by   Derrill Memo ON 11/09/2020] clopidogrel (PLAVIX) tablet 75 mg  75 mg Oral Daily Rikki Spearing, NP       docusate sodium (COLACE) capsule 100 mg  100 mg Oral BID Karmen Bongo, MD       dorzolamide (TRUSOPT) 2 % ophthalmic solution 1 drop  1 drop Both Eyes BID Karmen Bongo, MD       enoxaparin (LOVENOX) injection 40 mg  40 mg Subcutaneous Daily Karmen Bongo, MD   40 mg at 11/08/20 1337   hydrALAZINE (APRESOLINE) injection 5 mg  5 mg Intravenous Q4H PRN Karmen Bongo, MD       HYDROcodone-acetaminophen (NORCO/VICODIN) 5-325 MG per tablet 1-2 tablet  1-2 tablet Oral Q4H PRN Karmen Bongo, MD       insulin aspart (novoLOG) injection 0-15 Units  0-15 Units Subcutaneous TID WC Karmen Bongo, MD       Derrill Memo  ON 11/09/2020] lactated ringers infusion   Intravenous Continuous Karmen Bongo, MD       Derrill Memo ON 11/09/2020] methylPREDNISolone sodium succinate (SOLU-MEDROL) 1,000 mg in sodium chloride 0.9 % 50 mL IVPB  1,000 mg Intravenous Daily Karmen Bongo, MD       morphine 2 MG/ML injection 2 mg  2 mg Intravenous Q2H PRN Karmen Bongo, MD       ondansetron [facility]) tablet 4 mg  4 mg Oral Q6H PRN Karmen Bongo, MD       Or   ondansetron [facility]) injection 4 mg  4 mg Intravenous Q6H PRN Karmen Bongo, MD   4 mg at 11/08/20 1335   polyethylene glycol (MIRALAX / GLYCOLAX) packet 17 g  17 g Oral Daily PRN Karmen Bongo, MD       pravastatin (PRAVACHOL) tablet 40 mg  40 mg Oral QHS Karmen Bongo, MD       sodium chloride flush (NS) 0.9 % injection 3 mL  3 mL Intravenous Lillia Mountain, MD   3 mL at 11/08/20 1339   Current Outpatient Medications  Medication Sig Dispense Refill   acetaminophen (TYLENOL) 325 MG tablet Take 325-650 mg by mouth every 6 (six) hours as needed for moderate pain or headache.     amLODipine (NORVASC) 5 MG tablet Take 5 mg by mouth daily.     dorzolamide (TRUSOPT) 2 % ophthalmic solution Place 1 drop into both eyes 2 (two) times daily.     JANUVIA 100 MG tablet Take 100 mg by mouth daily.     metFORMIN (GLUCOPHAGE) 1000 MG tablet Take 1,000 mg by mouth 2 (two) times daily with a meal.     Multiple Vitamin (MULTIVITAMIN) tablet Take 1 tablet by mouth daily.     pravastatin (PRAVACHOL) 40 MG tablet Take 40 mg by mouth at bedtime.      REVIEW OF SYSTEMS:  [X]  denotes positive finding, [ ]  denotes negative finding Vascular    Leg swelling    Cardiac    Chest pain or chest pressure:    Shortness of breath upon exertion:    Short of breath when lying flat:    Irregular heart rhythm:    Constitutional    Fever or chills:     PHYSICAL EXAM:   Vitals:   11/08/20 1515 11/08/20 1530 11/08/20 1545 11/08/20 1600  BP: (!) 150/79 (!) 151/71 (!) 159/77 (!) 145/70   Pulse: 84 86 83 86  Resp: 17 17 16 19   Temp:      TempSrc:      SpO2: 99% 95% 97% 98%    GENERAL: The patient is a well-nourished female, in no acute distress. The vital signs are documented above. CARDIOVASCULAR: There is a regular rate and rhythm. PULMONARY: There is good air exchange bilaterally without wheezing or rales. VASCULAR: I do not detect carotid bruits. She has an easily palpable right temporal pulse that is not tender.  DATA:   Sed rate is 111.  INR 1.1.  PTT 33.  Hemoglobin 10.9.  MEDICAL ISSUES:   POSSIBLE TEMPORAL ARTERITIS: This patient has headaches and is lost vision in the right eye in addition to having a markedly elevated sed rate.  We have been asked to perform a temporal artery biopsy.  I have discussed the indications for the procedure and the potential complications with the patient and she is agreeable to proceed.  This is scheduled for tomorrow.  I have written preop orders.  Deitra Mayo [facility] [phone]

## 2020-11-08 NOTE — ED Notes (Signed)
Pt returns from MRI, tried to eat lunch but became nauseated. Small amt vomit, getting zofran for pt.

## 2020-11-08 NOTE — H&P (Signed)
History and Physical    Jenna Long DIY:[id number] DOB: 1934-06-07 DOA: 11/07/2020  PCP: Jenna Sites, MD Consultants:  Jenna Long - orthopedics; Jenna Long - ophthalmology; Jenna Long - optometry Patient coming from:  Home - lives alone; NOK: Daughter, Jenna Long, [phone]  Chief Complaint: R eye vision loss  HPI: Jenna Long is a 86 y.o. female with medical history significant of HTN; HLD; and DM presenting with R eye vision loss. She has had inner ear and sinus problems.  The inner ear went away but the sinus stopped her head up.  When it started again, she noticed a film over her R eye.  She called Dr. Rosana Long and made an appointment for Monday at 3:15.  She was concerned that she had lost her eyesight.  Dr. Rosana Long understood what was going on and called Dr. Katy Long for an appointment at 5pm  They were late for the appointment and so they came to the ER.  It has not changed in the ER.  The film has gone but the vision is completely gone -0 can not even see light.  No headache but she has had temporal discomfort for about 2 weeks since her sinuses got stopped up.  She gets pulsating pains in her R temporal region and goes to lie down until they improve.    ED Course: Likely giant cell arteritis, complete loss of vision in R eye.  Neurologist recommended 3 days of IV solumedrol.  Pending MRI.  Temporal HA for weeks, 5 days ago with film over vision in R eye, now with complete loss of vision.  Also with R abducens nerve palsy, also with nasal congestion on the R - will r/o infection, cavernous sinus thrombosis.  Clinically with elevated ESR, temporal artery TTP.  Neurology is happy to consult if MRI is abnormal.  Review of Systems: As per HPI; otherwise review of systems reviewed and negative.   Ambulatory Status:  Ambulates without assistance  COVID Vaccine Status:   Complete x 2 boosters  Past Medical History:  Diagnosis Date   Diabetes mellitus type II    DNR (do not resuscitate) 11/08/2020    Hypercholesteremia    Hypertension     Past Surgical History:  Procedure Laterality Date   ABDOMINAL HYSTERECTOMY  1991   [facility], [place]   ANKLE SURGERY  1989   left, [place]   CATARACT EXTRACTION W/ INTRAOCULAR LENS IMPLANT  06/10/06    Right, [facility], [place]   CATARACT EXTRACTION W/PHACO  09/18/2010   Procedure: CATARACT EXTRACTION PHACO AND INTRAOCULAR LENS PLACEMENT (IOC);  Surgeon: Williams Che;  Location: [facility];  Service: Ophthalmology;  Laterality: Left;   COLONOSCOPY W/ POLYPECTOMY  06/20/10   [facility], [place]   HIP ARTHROPLASTY Left 02/18/2020   Procedure: ARTHROPLASTY BIPOLAR HIP (HEMIARTHROPLASTY);  Surgeon: Mordecai Rasmussen, MD;  Location: [facility];  Service: Orthopedics;  Laterality: Left;    Social History   Socioeconomic History   Marital status: Widowed    Spouse name: Not on file   Number of children: Not on file   Years of education: Not on file   Highest education level: Not on file  Occupational History   Occupation: retired  Tobacco Use   Smoking status: Never   Smokeless tobacco: Never  Substance and Sexual Activity   Alcohol use: No   Drug use: No   Sexual activity: Not on file  Other Topics Concern   Not on file  Social History Narrative   Not on file  Social Determinants of Health   Financial Resource Strain: Not on file  Food Insecurity: Not on file  Transportation Needs: Not on file  Physical Activity: Not on file  Stress: Not on file  Social Connections: Not on file  Intimate Partner Violence: Not on file    No Known Allergies  Family History  Problem Relation Age of Onset   Anesthesia problems Neg Hx     Prior to Admission medications   Medication Sig Start Date End Date Taking? Authorizing Provider  acetaminophen (TYLENOL) 325 MG tablet Take 325-650 mg by mouth every 6 (six) hours as needed for moderate pain or headache.   Yes [provider]  amLODipine (NORVASC) 5 MG tablet Take 5 mg by mouth daily.   Yes [provider]  dorzolamide (TRUSOPT) 2 % ophthalmic solution Place 1 drop into both eyes 2 (two) times daily. 10/04/20  Yes [provider]  JANUVIA 100 MG tablet Take 100 mg by mouth daily. 01/18/20  Yes [provider]  metFORMIN (GLUCOPHAGE) 1000 MG tablet Take 1,000 mg by mouth 2 (two) times daily with a meal.   Yes [provider]  Multiple Vitamin (MULTIVITAMIN) tablet Take 1 tablet by mouth daily.   Yes [provider]  pravastatin (PRAVACHOL) 40 MG tablet Take 40 mg by mouth at bedtime. 10/11/20  Yes [provider]  atorvastatin (LIPITOR) 20 MG tablet Take 1 tablet (20 mg total) by mouth at bedtime. Patient not taking: Reported on 11/08/2020 02/20/20   Jenna Eva, MD  enoxaparin (LOVENOX) 40 MG/0.4ML injection Inject 0.4 mLs (40 mg total) into the skin daily. X 14 days Patient not taking: No sig reported 02/21/20   Jenna Eva, MD  Ensure Max Protein (ENSURE MAX PROTEIN) LIQD Take 330 mLs (11 oz total) by mouth daily. Patient not taking: No sig reported 02/21/20   Jenna Eva, MD  HYDROcodone-acetaminophen (NORCO/VICODIN) 5-325 MG tablet Take 1-2 tablets by mouth every 6 (six) hours as needed for moderate pain. Patient not taking: No sig reported 02/20/20   Jenna, [name], MD  nutrition supplement, JUVEN, (JUVEN) PACK Take 1 packet by mouth 2 (two) times daily between meals. Patient not taking: No sig reported 02/20/20   Jenna Eva, MD  phosphorus (K PHOS NEUTRAL) [phone] MG tablet Take 2 tablets (500 mg total) by mouth 2 (two) times daily. X 3 days Patient not taking: No sig reported 02/20/20   Jenna Eva, MD    Physical Exam: Vitals:   11/08/20 0930 11/08/20 0945 11/08/20 1000 11/08/20 1015  BP: (!) 175/70 (!) 151/63 (!) 144/65 134/64  Pulse: 82 77 74 73  Resp: (!) 24 17 18  (!) 23  Temp:      TempSrc:      SpO2: 99% 99% 99% 99%     General:  Appears calm and comfortable and is in NAD Eyes:  Limited R eye abduction, complete R eye vision loss, L without  apparent deficit currently ENT:  grossly normal hearing, lips & tongue, mmm; poor dentition; +TTP above R temporal artery Neck:  no LAD, masses or thyromegaly Cardiovascular:  RRR, no m/r/g. No LE edema.  Respiratory:   CTA bilaterally with no wheezes/rales/rhonchi.  Normal respiratory effort. Abdomen:  soft, NT, ND Skin:  no rash or induration seen on limited exam Musculoskeletal:  grossly normal tone BUE/BLE, good ROM, no bony abnormality Psychiatric:  grossly normal mood and affect, speech fluent and appropriate, AOx3 Neurologic:  CN 2-12 grossly intact other than limited R eye  abduction, moves all extremities in coordinated fashion    Radiological Exams on Admission: Independently reviewed - see discussion in A/P where applicable  CT HEAD WO CONTRAST  Result Date: 11/07/2020 CLINICAL DATA:  Headache. EXAM: CT HEAD WITHOUT CONTRAST TECHNIQUE: Contiguous axial images were obtained from the base of the skull through the vertex without intravenous contrast. COMPARISON:  Jun 27, 2020 FINDINGS: Brain: There is mild cerebral atrophy with widening of the extra-axial spaces and ventricular dilatation. There are areas of decreased attenuation within the white matter tracts of the supratentorial brain, consistent with microvascular disease changes. Vascular: No hyperdense vessel or unexpected calcification. Skull: Normal. Negative for fracture or focal lesion. Sinuses/Orbits: No acute finding. Other: None. IMPRESSION: 1. Generalized cerebral atrophy. 2. No acute intracranial abnormality. Electronically Signed   By: Virgina Norfolk M.D.   On: 11/07/2020 21:26    EKG: Independently reviewed.  NSR with rate 83; nonspecific ST changes with no evidence of acute ischemia   Labs on Admission: I have personally reviewed the available labs and imaging studies at the time of the admission.  Pertinent labs:   Glucose 205 Albumin 2.9 WBC 6.7 Hgb 10.0 Platelets 516 CRP 8.6 ESR 111 INR  1.1   Assessment/Plan Principal Problem:   Temporal arteritis (HCC) Active Problems:   Essential hypertension   Mixed hyperlipidemia   Diabetes mellitus type 2 in nonobese (HCC)   DNR (do not resuscitate)   R eye vision loss -Subacute temporal pain, pulsatile in nature -Developed a "film" over her R eye about 5 days ago and this has progressed to total R eye vision loss -Continues to have TTP over the R temporal artery -Appears very consistent with temporal arteritis -Markedly elevated ESR and CRP -Needs a temporal artery biopsy - Dr. Scot Dock notified by Secure Chat (and attempted to page, suspect he is in the OR - this is not urgent since damage is already done) -Started on 1 g Solumedrol daily x 3 days -MRI brain/orbits pending to evaluate for other causes (she has a R abductor muscle impairment, which is not usually c/w temporal arteritis) such as CVA, cavernous sinus thrombosis; if abnormal, needs neurology consult -Will admit to telemetry -She appears likely to need CIR at the time of d/c in order to adjust to her new visual impairment; will need PT/OT consults -Continue Trusopt in B eyes for now (glaucoma)  DM -Will check A1c -hold Glucophage, Januvia -Cover with moderate-scale SSI  -She is likely to have worse control while on high-dose steroids  HTN -Continue Norvasc  HLD -Continue Pravachol  DNR -I have discussed code status with the patient and she would not desire resuscitation and would prefer to die a natural death should that situation arise. -She will need a gold out of facility DNR form at the time of discharge      Note: This patient has been tested and is pending for the novel coronavirus COVID-19. The patient has been fully vaccinated against COVID-19.   Level of care: Telemetry Medical DVT prophylaxis:  Lovenox  Code Status:  DNR - confirmed with patient Family Communication: None present; she did not request that I call her daughter at the time  of admission (since her daughter was up all night in the ER with her) Disposition Plan:  The patient is from: home  Anticipated d/c is to: CIR  Anticipated d/c date will depend on clinical response to treatment, likely at least 2-3 days  Patient is currently: acutely ill Consults called: Vascular surgery; will  need PT/OT and likely CIR  Admission status:  Admit - It is my clinical opinion that admission to INPATIENT is reasonable and necessary because of the expectation that this patient will require hospital care that crosses at least 2 midnights to treat this condition based on the medical complexity of the problems presented.  Given the aforementioned information, the predictability of an adverse outcome is felt to be significant.    Karmen Bongo MD [facility]   How to contact the [facility] Attending or Consulting provider [place] or covering provider during after hours [place], for this patient?  Check the care team in [facility] and look for a) attending/consulting [facility] provider listed and b) the [facility] team listed Log into [email or web address] and use [place]'s universal password to access. If you do not have the password, please contact the hospital operator. Locate the [facility] provider you are looking for under [facility] and page to a number that you can be directly reached. If you still have difficulty reaching the provider, please page the [facility] (Director on Call) for the Hospitalists listed on amion for assistance.   11/08/2020, 11:12 AM

## 2020-11-08 NOTE — ED Provider Notes (Signed)
J[facility] Long EMERGENCY DEPARTMENT Provider Note   CSN: [id number] Arrival date & time: 11/07/20  1723     History Chief Complaint  Patient presents with   Loss of Vision    Jenna Long is a 86 y.o. female with history of diabetes, hypertension, high cholesterol, present emergency department with loss of vision in the right eye.  The patient reports that her symptoms began 5 days ago on Thursday, with a film that developed over her entire vision in the right eye.  This film is gradually worsened and reports she had experienced complete loss of vision in her right eye before coming into the Ed yesterday. She describes her vision loss as a black curtain over the right eye.  Her left eye is unaffected.  She reports that she has been having temporal and right-sided headaches for several days or weeks, intermittently.  She also reports that she felt that she recently had a sinus infection with congestion more so on the right side of her maxilla and face.  She denies fevers or chills.   She currently reports that she has no active headache.  She does not have any temporal pain.  She does report that she has intermittent throbbing and pain in her right eye but not constant, and no active pain. She does not wear contacts.    She denies any known history of giant cell arteritis or autoimmune disease.  She does report that she is diabetic on oral treatments.  Her daughter is present at bedside to provide supplemental history.  They reported they were seen by an optometrist yesterday in [facility] and then referred to see an ophthalmologist, but were not able to be seen by the ophthalmologist in the office by the time they arrived for their appointment.  Instead they were referred into the emergency department for treatment.  Per medical records Dr Katy Fitch contacted Ed yesterday and again this morning to follow up on the patient's triage and treatment. Unfortunately, due to prolonged waiting  times, the patient had an approximate 15-hour wait prior to being roomed in the morning and evaluated by myself this morning at 0900.  HPI     Past Medical History:  Diagnosis Date   Diabetes mellitus type II    Hypercholesteremia    Hypertension     Patient Active Problem List   Diagnosis Date Noted   Left displaced femoral neck fracture ([place]) 02/18/2020   Essential hypertension 02/18/2020   Mixed hyperlipidemia 02/18/2020   Fall     Past Surgical History:  Procedure Laterality Date   ABDOMINAL HYSTERECTOMY  1991   [facility], [place]   left, [place]   CATARACT EXTRACTION W/ INTRAOCULAR LENS IMPLANT  06/10/06    Right, [facility], [place]   CATARACT EXTRACTION W/PHACO  09/18/2010   Procedure: CATARACT EXTRACTION PHACO AND INTRAOCULAR LENS PLACEMENT (IOC);  Surgeon: Williams Che;  Location: [facility];  Service: Ophthalmology;  Laterality: Left;   COLONOSCOPY W/ POLYPECTOMY  06/20/10   [facility], [place]   HIP ARTHROPLASTY Left 02/18/2020   Procedure: ARTHROPLASTY BIPOLAR HIP (HEMIARTHROPLASTY);  Surgeon: Mordecai Rasmussen, MD;  Location: [facility];  Service: Orthopedics;  Laterality: Left;     OB History   No obstetric history on file.     Family History  Problem Relation Age of Onset   Anesthesia problems Neg Hx     Social History   Tobacco Use   Smoking status: Never   Smokeless  tobacco: Never  Substance Use Topics   Alcohol use: No   Drug use: No    Home Medications Prior to Admission medications   Medication Sig Start Date End Date Taking? Authorizing Provider  acetaminophen (TYLENOL) 325 MG tablet Take 325-650 mg by mouth every 6 (six) hours as needed for moderate pain or headache.   Yes [provider]  amLODipine (NORVASC) 5 MG tablet Take 5 mg by mouth daily.   Yes [provider]  dorzolamide (TRUSOPT) 2 % ophthalmic solution Place 1 drop into both eyes 2 (two) times daily. 10/04/20  Yes [provider]  JANUVIA 100 MG tablet  Take 100 mg by mouth daily. 01/18/20  Yes [provider]  metFORMIN (GLUCOPHAGE) 1000 MG tablet Take 1,000 mg by mouth 2 (two) times daily with a meal.   Yes [provider]  Multiple Vitamin (MULTIVITAMIN) tablet Take 1 tablet by mouth daily.   Yes [provider]  pravastatin (PRAVACHOL) 40 MG tablet Take 40 mg by mouth at bedtime. 10/11/20  Yes [provider]  atorvastatin (LIPITOR) 20 MG tablet Take 1 tablet (20 mg total) by mouth at bedtime. Patient not taking: Reported on 11/08/2020 02/20/20   [name], MD  enoxaparin (LOVENOX) 40 MG/0.4ML injection Inject 0.4 mLs (40 mg total) into the skin daily. X 14 days Patient not taking: No sig reported 02/21/20   [name], MD  Ensure Max Protein (ENSURE MAX PROTEIN) LIQD Take 330 mLs (11 oz total) by mouth daily. Patient not taking: No sig reported 02/21/20   [name], MD  HYDROcodone-acetaminophen (NORCO/VICODIN) 5-325 MG tablet Take 1-2 tablets by mouth every 6 (six) hours as needed for moderate pain. Patient not taking: No sig reported 02/20/20   [name], [name], MD  nutrition supplement, JUVEN, (JUVEN) PACK Take 1 packet by mouth 2 (two) times daily between meals. Patient not taking: No sig reported 02/20/20   [name], MD  phosphorus (K PHOS NEUTRAL) [phone] MG tablet Take 2 tablets (500 mg total) by mouth 2 (two) times daily. X 3 days Patient not taking: No sig reported 02/20/20   [name], MD    Allergies    Patient has no known allergies.  Review of Systems   Review of Systems  Constitutional:  Negative for chills and fever.  HENT:  Positive for congestion, sinus pressure and sinus pain. Negative for sore throat.   Eyes:  Positive for pain and visual disturbance. Negative for photophobia and redness.  Respiratory:  Negative for cough and shortness of breath.   Cardiovascular:  Negative for chest pain and palpitations.  Gastrointestinal:  Negative for abdominal pain and vomiting.  Genitourinary:   Negative for dysuria and hematuria.  Musculoskeletal:  Negative for arthralgias and back pain.  Skin:  Negative for color change and rash.  Neurological:  Positive for headaches. Negative for syncope.  All other systems reviewed and are negative.  Physical Exam Updated Vital Signs BP (!) 174/81 (BP Location: Right Arm)   Pulse 85   Temp (!) 97.4 F (36.3 C)   Resp 17   SpO2 98%   Physical Exam Constitutional:      General: She is not in acute distress. HENT:     Head: Normocephalic and atraumatic.     Comments: Mild temporal tenderness to palpation Eyes:     Conjunctiva/sclera: Conjunctivae normal.     Pupils: Pupils are equal, round, and reactive to light.     Comments: Complete vision loss right eye +Abducens  nerve palsy right eye Left eye vision 20/30  Cardiovascular:     Rate and Rhythm: Normal rate and regular rhythm.     Pulses: Normal pulses.  Pulmonary:     Effort: Pulmonary effort is normal. No respiratory distress.     Breath sounds: Normal breath sounds.  Abdominal:     General: There is no distension.     Tenderness: There is no abdominal tenderness.  Skin:    General: Skin is warm and dry.  Neurological:     Mental Status: She is alert and oriented to person, place, and time. Mental status is at baseline.     Sensory: No sensory deficit.     Motor: No weakness.     Comments: +Right abducens nerve palsy Remainder of CN exam intact  Psychiatric:        Mood and Affect: Mood normal.        Behavior: Behavior normal.    ED Results / Procedures / Treatments   Labs (all labs ordered are listed, but only abnormal results are displayed) Labs Reviewed  CBC - Abnormal; Notable for the following components:      Result Value   RBC 3.59 (*)    Hemoglobin 10.0 (*)    HCT 32.2 (*)    Platelets 516 (*)    All other components within normal limits  COMPREHENSIVE METABOLIC PANEL - Abnormal; Notable for the following components:   Sodium 134 (*)    Glucose,  Bld 205 (*)    Albumin 2.9 (*)    AST 13 (*)    All other components within normal limits  SEDIMENTATION RATE - Abnormal; Notable for the following components:   Sed Rate 111 (*)    All other components within normal limits  C-REACTIVE PROTEIN - Abnormal; Notable for the following components:   CRP 8.6 (*)    All other components within normal limits  I-STAT CHEM 8, ED - Abnormal; Notable for the following components:   Glucose, Bld 207 (*)    Hemoglobin 10.9 (*)    HCT 32.0 (*)    All other components within normal limits  PROTIME-INR  APTT  DIFFERENTIAL  CBG MONITORING, ED    EKG None  Radiology CT HEAD WO CONTRAST  Result Date: 11/07/2020 CLINICAL DATA:  Headache. EXAM: CT HEAD WITHOUT CONTRAST TECHNIQUE: Contiguous axial images were obtained from the base of the skull through the vertex without intravenous contrast. COMPARISON:  Jun 27, 2020 FINDINGS: Brain: There is mild cerebral atrophy with widening of the extra-axial spaces and ventricular dilatation. There are areas of decreased attenuation within the white matter tracts of the supratentorial brain, consistent with microvascular disease changes. Vascular: No hyperdense vessel or unexpected calcification. Skull: Normal. Negative for fracture or focal lesion. Sinuses/Orbits: No acute finding. Other: None. IMPRESSION: 1. Generalized cerebral atrophy. 2. No acute intracranial abnormality. Electronically Signed   By: Virgina Norfolk M.D.   On: 11/07/2020 21:26    Procedures .Critical Care Performed by: Wyvonnia Dusky, MD Authorized by: Wyvonnia Dusky, MD   Critical care provider statement:    Critical care time (minutes):  40   Critical care was time spent personally by me on the following activities:  Discussions with consultants, evaluation of patient's response to treatment, examination of patient, ordering and performing treatments and interventions, ordering and review of laboratory studies, ordering and review of  radiographic studies, pulse oximetry, re-evaluation of patient's condition, obtaining history from patient or surrogate and review of old charts  Care discussed with: admitting provider   Comments:     High dose IV steroids for suspected GCA   Medications Ordered in ED Medications  methylPREDNISolone sodium succinate (SOLU-MEDROL) 1,000 mg in sodium chloride 0.9 % 50 mL IVPB (has no administration in time range)    ED Course  I have reviewed the triage vital signs and the nursing notes.  Pertinent labs & imaging results that were available during my care of the patient were reviewed by me and considered in my medical decision making (see chart for details).  Ddx includes GCA most likely vs cavernous sinus thrombosis vs sinus infection complication vs CVA vs other  I personally reviewed her labs which show significant elevated sed rate of 111.  CRP also elevated at 8.6.  These findings, in conjunction with her temporal headache and tenderness on exam, do raise my concern for giant cell arteritis.  Unfortunately it appears that the patient had experienced complete loss of vision prior even to arrival in the emergency department.  Regardless I have ordered the IV Solu-Medrol, high-dose, after discussion with the patient and her daughter consenting them for this medication.  This will need to be continued for 3 days if GCA remains in the differential.  She would also need a temporal artery biopsy to confirm the diagnosis, although this can happen in the hospital or arranged after discharge.    Clinically she does not demonstrate any other neurological deficits to suggest a stroke.  She did have a CT scan ordered on arrival which shows no focal CVA.  We will need an MRI of the brain and the orbits to evaluate for posterior orbital infection or cavernous sinus infection or thrombosis, given her abducens nerve palsy and her complaint of recent sinus symptoms.  She has no fever and white blood cell  count is normal.  Have a low suspicion for sepsis.  I have a low clinical suspicion for meningitis.  She has no nuchal rigidity, no headache at this time.  Supplemental history was provided by her daughter at bedside. Her home medications have been reordered  Clinical Course as of 11/08/20 1650  Tue Nov 08, 2020  0037 I spoke to the neurologist Dr Theda Sers who did agree with MRI imaging of the brain and orbits to evaluate for posterior cavernous thrombosis or infection given her recent sinus congestion and her abducen's palsy.  She will require hospitalization regardless - if concern remains for GCA, would need 3 days of 1 g IV solumedrol, and temporal artery biopsy [MT]  1018 Patient admitted to hospitalist.  She and her daughter were informed of the plan for admission and medical workup, and understand there is a high likelihood she may have permanent loss of vision in her right eye. [MT]  1300 Hospitalist notified of radiology call regarding MRI results - hospitalist has contacted neurology [MT]    Clinical Course User Index [MT] Lyriq Jarchow, Carola Rhine, MD    Final Clinical Impression(s) / ED Diagnoses Final diagnoses:  None    Rx / DC Orders ED Discharge Orders     None        Wyvonnia Dusky, MD 11/08/20 1650

## 2020-11-08 NOTE — Consult Note (Signed)
Patient name: Jenna Long MRN: [id number] DOB: 1934/04/25 Sex: female  REASON FOR CONSULT:   For temporal artery biopsy.  The consult is requested by Dr. Karmen Bongo.  HPI:   Jenna Long is a pleasant 86 y.o. female who developed gradual loss of vision in her right eye several days ago.  This is progressed.  She also has been having headaches.  Her sed rate is markedly elevated at 111.  Vascular surgery was consulted for a temporal artery biopsy.  Current Facility-Administered Medications  Medication Dose Route Frequency Provider Last Rate Last Admin   acetaminophen (TYLENOL) tablet 650 mg  650 mg Oral Q6H PRN Karmen Bongo, MD       Or   acetaminophen (TYLENOL) suppository 650 mg  650 mg Rectal Q6H PRN Karmen Bongo, MD       amLODipine (NORVASC) tablet 5 mg  5 mg Oral Daily Karmen Bongo, MD   5 mg at 11/08/20 1937   aspirin tablet 325 mg  325 mg Oral Once Rikki Spearing, NP       Followed by   Derrill Memo ON 11/09/2020] aspirin EC tablet 81 mg  81 mg Oral Daily Rikki Spearing, NP       bisacodyl (DULCOLAX) EC tablet 5 mg  5 mg Oral Daily PRN Karmen Bongo, MD       clopidogrel (PLAVIX) tablet 300 mg  300 mg Oral Once Rikki Spearing, NP       Followed by   Derrill Memo ON 11/09/2020] clopidogrel (PLAVIX) tablet 75 mg  75 mg Oral Daily Rikki Spearing, NP       docusate sodium (COLACE) capsule 100 mg  100 mg Oral BID Karmen Bongo, MD       dorzolamide (TRUSOPT) 2 % ophthalmic solution 1 drop  1 drop Both Eyes BID Karmen Bongo, MD       enoxaparin (LOVENOX) injection 40 mg  40 mg Subcutaneous Daily Karmen Bongo, MD   40 mg at 11/08/20 1337   hydrALAZINE (APRESOLINE) injection 5 mg  5 mg Intravenous Q4H PRN Karmen Bongo, MD       HYDROcodone-acetaminophen (NORCO/VICODIN) 5-325 MG per tablet 1-2 tablet  1-2 tablet Oral Q4H PRN Karmen Bongo, MD       insulin aspart (novoLOG) injection 0-15 Units  0-15 Units Subcutaneous TID WC Karmen Bongo, MD       Derrill Memo  ON 11/09/2020] lactated ringers infusion   Intravenous Continuous Karmen Bongo, MD       Derrill Memo ON 11/09/2020] methylPREDNISolone sodium succinate (SOLU-MEDROL) 1,000 mg in sodium chloride 0.9 % 50 mL IVPB  1,000 mg Intravenous Daily Karmen Bongo, MD       morphine 2 MG/ML injection 2 mg  2 mg Intravenous Q2H PRN Karmen Bongo, MD       ondansetron [facility]) tablet 4 mg  4 mg Oral Q6H PRN Karmen Bongo, MD       Or   ondansetron [facility]) injection 4 mg  4 mg Intravenous Q6H PRN Karmen Bongo, MD   4 mg at 11/08/20 1335   polyethylene glycol (MIRALAX / GLYCOLAX) packet 17 g  17 g Oral Daily PRN Karmen Bongo, MD       pravastatin (PRAVACHOL) tablet 40 mg  40 mg Oral QHS Karmen Bongo, MD       sodium chloride flush (NS) 0.9 % injection 3 mL  3 mL Intravenous Lillia Mountain, MD   3 mL at 11/08/20 1339   Current Outpatient Medications  Medication Sig Dispense Refill   acetaminophen (TYLENOL) 325 MG tablet Take 325-650 mg by mouth every 6 (six) hours as needed for moderate pain or headache.     amLODipine (NORVASC) 5 MG tablet Take 5 mg by mouth daily.     dorzolamide (TRUSOPT) 2 % ophthalmic solution Place 1 drop into both eyes 2 (two) times daily.     JANUVIA 100 MG tablet Take 100 mg by mouth daily.     metFORMIN (GLUCOPHAGE) 1000 MG tablet Take 1,000 mg by mouth 2 (two) times daily with a meal.     Multiple Vitamin (MULTIVITAMIN) tablet Take 1 tablet by mouth daily.     pravastatin (PRAVACHOL) 40 MG tablet Take 40 mg by mouth at bedtime.      REVIEW OF SYSTEMS:  [X]  denotes positive finding, [ ]  denotes negative finding Vascular    Leg swelling    Cardiac    Chest pain or chest pressure:    Shortness of breath upon exertion:    Short of breath when lying flat:    Irregular heart rhythm:    Constitutional    Fever or chills:     PHYSICAL EXAM:   Vitals:   11/08/20 1515 11/08/20 1530 11/08/20 1545 11/08/20 1600  BP: (!) 150/79 (!) 151/71 (!) 159/77 (!) 145/70   Pulse: 84 86 83 86  Resp: 17 17 16 19   Temp:      TempSrc:      SpO2: 99% 95% 97% 98%    GENERAL: The patient is a well-nourished female, in no acute distress. The vital signs are documented above. CARDIOVASCULAR: There is a regular rate and rhythm. PULMONARY: There is good air exchange bilaterally without wheezing or rales. VASCULAR: I do not detect carotid bruits. She has an easily palpable right temporal pulse that is not tender.  DATA:   Sed rate is 111.  INR 1.1.  PTT 33.  Hemoglobin 10.9.  MEDICAL ISSUES:   POSSIBLE TEMPORAL ARTERITIS: This patient has headaches and is lost vision in the right eye in addition to having a markedly elevated sed rate.  We have been asked to perform a temporal artery biopsy.  I have discussed the indications for the procedure and the potential complications with the patient and she is agreeable to proceed.  This is scheduled for tomorrow.  I have written preop orders.  Deitra Mayo [facility] [place] [phone]

## 2020-11-09 ENCOUNTER — Encounter (HOSPITAL_COMMUNITY): Payer: Self-pay | Admitting: Internal Medicine

## 2020-11-09 ENCOUNTER — Encounter (HOSPITAL_COMMUNITY)
Admission: EM | Disposition: A | Discharge status: Rehabilitation Facility | Payer: Self-pay | Source: Home / Self Care | Attending: Internal Medicine

## 2020-11-09 ENCOUNTER — Inpatient Hospital Stay (HOSPITAL_COMMUNITY): Payer: PPO | Admitting: Certified Registered Nurse Anesthetist

## 2020-11-09 ENCOUNTER — Other Ambulatory Visit (HOSPITAL_COMMUNITY): Payer: PPO

## 2020-11-09 ENCOUNTER — Encounter (HOSPITAL_COMMUNITY): Payer: PPO

## 2020-11-09 DIAGNOSIS — M316 Other giant cell arteritis: Principal | ICD-10-CM

## 2020-11-09 DIAGNOSIS — I6330 Cerebral infarction due to thrombosis of unspecified cerebral artery: Secondary | ICD-10-CM | POA: Diagnosis not present

## 2020-11-09 HISTORY — PX: ARTERY BIOPSY: SHX891

## 2020-11-09 LAB — LIPID PANEL
Cholesterol: 165 mg/dL (ref 0–200)
HDL: 46 mg/dL (ref >40)
LDL Cholesterol: 100 mg/dL — ABNORMAL HIGH (ref 0–99)
Total CHOL/HDL Ratio: 3.6 RATIO
Triglycerides: 96 mg/dL (ref <150)
VLDL: 19 mg/dL (ref 0–40)

## 2020-11-09 LAB — BASIC METABOLIC PANEL
Anion gap: 10 (ref 5–15)
BUN: 13 mg/dL (ref 8–23)
CO2: 24 mmol/L (ref 22–32)
Calcium: 9 mg/dL (ref 8.9–10.3)
Chloride: 99 mmol/L (ref 98–111)
Creatinine, Ser: 0.63 mg/dL (ref 0.44–1.00)
GFR, Estimated: >60 mL/min (ref >60)
Glucose, Bld: 252 mg/dL — ABNORMAL HIGH (ref 70–99)
Potassium: 4 mmol/L (ref 3.5–5.1)
Sodium: 133 mmol/L — ABNORMAL LOW (ref 135–145)

## 2020-11-09 LAB — GLUCOSE, CAPILLARY
Glucose-Capillary: 192 mg/dL — ABNORMAL HIGH (ref 70–99)
Glucose-Capillary: 226 mg/dL — ABNORMAL HIGH (ref 70–99)
Glucose-Capillary: 234 mg/dL — ABNORMAL HIGH (ref 70–99)
Glucose-Capillary: 244 mg/dL — ABNORMAL HIGH (ref 70–99)
Glucose-Capillary: 246 mg/dL — ABNORMAL HIGH (ref 70–99)
Glucose-Capillary: 249 mg/dL — ABNORMAL HIGH (ref 70–99)
Glucose-Capillary: 251 mg/dL — ABNORMAL HIGH (ref 70–99)

## 2020-11-09 LAB — CBC
HCT: 31.5 % — ABNORMAL LOW (ref 36.0–46.0)
Hemoglobin: 10 g/dL — ABNORMAL LOW (ref 12.0–15.0)
MCH: 27.5 pg (ref 26.0–34.0)
MCHC: 31.7 g/dL (ref 30.0–36.0)
MCV: 86.5 fL (ref 80.0–100.0)
Platelets: 457 10*3/uL — ABNORMAL HIGH (ref 150–400)
RBC: 3.64 MIL/uL — ABNORMAL LOW (ref 3.87–5.11)
RDW: 14.1 % (ref 11.5–15.5)
WBC: 3.3 10*3/uL — ABNORMAL LOW (ref 4.0–10.5)
nRBC: 0 % (ref 0.0–0.2)

## 2020-11-09 SURGERY — BIOPSY TEMPORAL ARTERY
Anesthesia: Monitor Anesthesia Care | Site: Head | Laterality: Right

## 2020-11-09 MED ORDER — CHLORHEXIDINE GLUCONATE 0.12 % MT SOLN
OROMUCOSAL | Status: AC
  Administered 2020-11-09: 15 mL via OROMUCOSAL
  Filled 2020-11-09: qty 15

## 2020-11-09 MED ORDER — LIDOCAINE HCL (CARDIAC) PF 100 MG/5ML IV SOSY
PREFILLED_SYRINGE | INTRAVENOUS | Status: DC | PRN
  Administered 2020-11-09: 20 mg via INTRATRACHEAL

## 2020-11-09 MED ORDER — ATORVASTATIN CALCIUM 40 MG PO TABS
40.0000 mg | ORAL_TABLET | Freq: Every day | ORAL | Status: DC
  Administered 2020-11-09 – 2020-11-18 (×10): 40 mg via ORAL
  Filled 2020-11-09 (×10): qty 1

## 2020-11-09 MED ORDER — LIDOCAINE HCL (PF) 1 % IJ SOLN
INTRAMUSCULAR | Status: AC
  Filled 2020-11-09: qty 30

## 2020-11-09 MED ORDER — PROPOFOL 10 MG/ML IV BOLUS
INTRAVENOUS | Status: AC
  Filled 2020-11-09: qty 20

## 2020-11-09 MED ORDER — LIDOCAINE HCL (PF) 1 % IJ SOLN
INTRAMUSCULAR | Status: DC | PRN
  Administered 2020-11-09: 30 mL via INTRADERMAL

## 2020-11-09 MED ORDER — FENTANYL CITRATE (PF) 100 MCG/2ML IJ SOLN: 25.0000 ug | INTRAMUSCULAR | Status: DC | PRN

## 2020-11-09 MED ORDER — FENTANYL CITRATE (PF) 250 MCG/5ML IJ SOLN
INTRAMUSCULAR | Status: AC
  Filled 2020-11-09: qty 5

## 2020-11-09 MED ORDER — CHLORHEXIDINE GLUCONATE 0.12 % MT SOLN
15.0000 mL | OROMUCOSAL | Status: AC
  Filled 2020-11-09: qty 15

## 2020-11-09 MED ORDER — DEXMEDETOMIDINE (PRECEDEX) IN NS 20 MCG/5ML (4 MCG/ML) IV SYRINGE
PREFILLED_SYRINGE | INTRAVENOUS | Status: DC | PRN
  Administered 2020-11-09 (×3): 4 ug via INTRAVENOUS

## 2020-11-09 MED ORDER — 0.9 % SODIUM CHLORIDE (POUR BTL) OPTIME
TOPICAL | Status: DC | PRN
  Administered 2020-11-09: 1000 mL

## 2020-11-09 MED ORDER — SODIUM CHLORIDE 0.9 % IV SOLN: INTRAVENOUS | Status: DC

## 2020-11-09 MED ORDER — PROPOFOL 500 MG/50ML IV EMUL
INTRAVENOUS | Status: DC | PRN
  Administered 2020-11-09: 50 ug/kg/min via INTRAVENOUS

## 2020-11-09 MED ORDER — DEXMEDETOMIDINE (PRECEDEX) IN NS 20 MCG/5ML (4 MCG/ML) IV SYRINGE
PREFILLED_SYRINGE | INTRAVENOUS | Status: AC
  Filled 2020-11-09: qty 5

## 2020-11-09 SURGICAL SUPPLY — 54 items
ADH SKN CLS APL DERMABOND .7 (GAUZE/BANDAGES/DRESSINGS) ×1
APL PRP STRL LF DISP 70% ISPRP (MISCELLANEOUS) ×1
APL SKNCLS STERI-STRIP NONHPOA (GAUZE/BANDAGES/DRESSINGS) ×1
BAG COUNTER SPONGE SURGICOUNT (BAG) ×2 IMPLANT
BAG SPNG CNTER NS LX DISP (BAG) ×1
BALL CTTN LRG ABS STRL LF (GAUZE/BANDAGES/DRESSINGS) ×1
BENZOIN TINCTURE PRP APPL 2/3 (GAUZE/BANDAGES/DRESSINGS) ×2 IMPLANT
CANISTER SUCT 3000ML PPV (MISCELLANEOUS) ×2 IMPLANT
CHLORAPREP W/TINT 26 (MISCELLANEOUS) ×2 IMPLANT
CLIP VESOCCLUDE SM WIDE 6/CT (CLIP) ×2 IMPLANT
CNTNR URN SCR LID CUP LEK RST (MISCELLANEOUS) ×1 IMPLANT
CONT SPEC 4OZ STRL OR WHT (MISCELLANEOUS) ×2
COTTONBALL LRG STERILE PKG (GAUZE/BANDAGES/DRESSINGS) ×2 IMPLANT
COVER SURGICAL LIGHT HANDLE (MISCELLANEOUS) ×2 IMPLANT
DECANTER SPIKE VIAL GLASS SM (MISCELLANEOUS) ×2 IMPLANT
DERMABOND ADVANCED (GAUZE/BANDAGES/DRESSINGS) ×1
DERMABOND ADVANCED .7 DNX12 (GAUZE/BANDAGES/DRESSINGS) ×1 IMPLANT
DRAPE OPHTHALMIC 77X100 STRL (CUSTOM PROCEDURE TRAY) ×2 IMPLANT
ELECT NDL BLADE 2-5/6 (NEEDLE) ×1 IMPLANT
ELECT NEEDLE BLADE 2-5/6 (NEEDLE) ×2 IMPLANT
ELECT REM PT RETURN 9FT ADLT (ELECTROSURGICAL) ×2
ELECTRODE REM PT RTRN 9FT ADLT (ELECTROSURGICAL) ×1 IMPLANT
GAUZE 4X4 16PLY ~~LOC~~+RFID DBL (SPONGE) ×2 IMPLANT
GEL ULTRASOUND 8.5O AQUASONIC (MISCELLANEOUS) ×2 IMPLANT
GLOVE SURG POLYISO LF SZ8 (GLOVE) ×2 IMPLANT
GOWN STRL REUS W/ TWL LRG LVL3 (GOWN DISPOSABLE) ×1 IMPLANT
GOWN STRL REUS W/ TWL XL LVL3 (GOWN DISPOSABLE) ×1 IMPLANT
GOWN STRL REUS W/TWL LRG LVL3 (GOWN DISPOSABLE) ×2
GOWN STRL REUS W/TWL XL LVL3 (GOWN DISPOSABLE) ×2
KIT BASIN OR (CUSTOM PROCEDURE TRAY) ×2 IMPLANT
KIT TURNOVER KIT B (KITS) ×2 IMPLANT
LOOP VESSEL MINI RED (MISCELLANEOUS) ×2 IMPLANT
NDL HYPO 25GX1X1/2 BEV (NEEDLE) ×1 IMPLANT
NEEDLE HYPO 22GX1.5 SAFETY (NEEDLE) ×1 IMPLANT
NEEDLE HYPO 25GX1X1/2 BEV (NEEDLE) ×2 IMPLANT
NS IRRIG 1000ML POUR BTL (IV SOLUTION) ×2 IMPLANT
PACK GENERAL/GYN (CUSTOM PROCEDURE TRAY) ×2 IMPLANT
PAD ARMBOARD 7.5X6 YLW CONV (MISCELLANEOUS) ×4 IMPLANT
STRIP CLOSURE SKIN 1/2X4 (GAUZE/BANDAGES/DRESSINGS) ×2 IMPLANT
SUCTION FRAZIER HANDLE 10FR (MISCELLANEOUS) ×2
SUCTION TUBE FRAZIER 10FR DISP (MISCELLANEOUS) ×1 IMPLANT
SUT MNCRL AB 4-0 PS2 18 (SUTURE) ×2 IMPLANT
SUT PROLENE 6 0 BV (SUTURE) IMPLANT
SUT SILK 2 0 (SUTURE) ×2
SUT SILK 2-0 18XBRD TIE 12 (SUTURE) IMPLANT
SUT SILK 3 0 (SUTURE)
SUT SILK 3-0 18XBRD TIE 12 (SUTURE) IMPLANT
SUT SILK 4 0 (SUTURE) ×2
SUT SILK 4-0 18XBRD TIE 12 (SUTURE) IMPLANT
SUT VIC AB 3-0 SH 27 (SUTURE) ×2
SUT VIC AB 3-0 SH 27X BRD (SUTURE) ×1 IMPLANT
SYR CONTROL 10ML LL (SYRINGE) ×3 IMPLANT
TOWEL GREEN STERILE (TOWEL DISPOSABLE) ×2 IMPLANT
WATER STERILE IRR 1000ML POUR (IV SOLUTION) ×2 IMPLANT

## 2020-11-09 NOTE — Progress Notes (Addendum)
STROKE TEAM PROGRESS NOTE   ATTENDING NOTE: I reviewed above note and agree with the assessment and plan. Pt was seen and examined.   86 year old female with history of diabetes, hypertension, hyperlipidemia admitted for headache, vision loss, lethargy.  Per daughter and the patient, 2-3 weeks ago patient had inner ear infection and headache.  1 week ago inner ear infection but still has continued headache started to feel right eye blurry vision.  5 days ago, patient went to see her PCP and found to have right complete vision loss.  Also per daughter, for the last weeks, patient lethargic, not eating well, sleeping all day, not moving around, sometimes confused.  Patient denies any jaw pain, fever, diplopia or shoulder pain.  CT no acute abnormality, MRI right cerebellum 2 punctate infarcts, chronic left cerebral white matter and bilateral BG and thalamic lacunar infarcts.  CTA head and neck showed basilar artery, bilateral P2, distal M1, proximal M2, left A2 and right VA severe stenosis.  ESR 111, CRP 8.6, A1c 6.3, LDL 100.  2D echo pending.  Temporal artery biopsy done today, result pending.  On exam, patient awake alert fully orientated, however, right eye complete vision loss.  No aphasia, fluent language, follows simple commands, able to name and repeat.  Left eye no visual field deficit, no gaze palsy, facial symmetrical, tongue midline.  Bilateral upper and lower extremity equal strengths, sensation symmetrical, finger-to-nose intact.  However heel-to-shin left dysmetria.  Etiology for patient headache, vision loss and lethargy concerning for temporal arteritis.  ESR and CRP elevated favors temporal arteritis.  Biopsy result pending.  Recommend to continue Solu-Medrol 1 g daily for total 3 doses and then followed by prednisone 60 mg daily on discharge.  She will need to follow-up with neurology and rheumatology.  Given her history of diabetes, need close PCP follow-up for DM control in the setting  of steroid use.  Regarding the 2 punctate cerebellar infarct, likely incidental finding, however due to severe intracranial stenosis including right VA.  Recommend aspirin 325 and Plavix 75 DAPT for 3 months and then aspirin alone.  Change pravastatin to Lipitor 40.  PT OT pending.  We will follow  For detailed assessment and plan, please refer to above as I have made changes wherever appropriate.   I spent  35 minutes in total face-to-face time with the patient, more than 50% of which was spent in counseling and coordination of care, reviewing test results, images and medication, and discussing the diagnosis, treatment plan and potential prognosis. This patient's care requiresreview of multiple databases, neurological assessment, discussion with family, other specialists and medical decision making of high complexity. I had long discussion with patient and daughter at bedside, updated pt current condition, treatment plan and potential prognosis, and answered all the questions.  They expressed understanding and appreciation.    Jenna Hawking, MD PhD Stroke Neurology 11/09/2020 8:00 PM    INTERVAL HISTORY Her family is at the bedside.  Patient laying in dark room. Denies HA or light sensitivity. Vision impaired on right eye. She is unable to see color or differentiate objects.  Vitals:   11/08/20 2240 11/08/20 2304 11/09/20 0328 11/09/20 0755  BP: (!) 170/75 (!) 160/83 (!) 166/64 (!) 165/65  Pulse: 85 82 79 81  Resp: '16 17 19 16  ' Temp: (!) 97.5 F (36.4 C) (!) 97.5 F (36.4 C) 97.7 F (36.5 C) 97.6 F (36.4 C)  TempSrc: Oral Oral Oral Oral  SpO2: 99% 94% 100% 98%  Weight:  60 kg    Height:  '5\' 7"'  (1.702 m)     CBC:  Recent Labs  Lab 11/07/20 2046 11/07/20 2137 11/09/20 0144  WBC 6.7  --  3.3*  NEUTROABS 4.1  --   --   HGB 10.0* 10.9* 10.0*  HCT 32.2* 32.0* 31.5*  MCV 89.7  --  86.5  PLT 516*  --  465*   Basic Metabolic Panel:  Recent Labs  Lab 11/07/20 2046 11/07/20 2137  11/09/20 0144  NA 134* 136 133*  K 4.3 4.3 4.0  CL 99 100 99  CO2 24  --  24  GLUCOSE 205* 207* 252*  BUN '18 19 13  ' CREATININE 0.81 0.80 0.63  CALCIUM 9.3  --  9.0   Lipid Panel:  Recent Labs  Lab 11/09/20 0144  CHOL 165  TRIG 96  HDL 46  CHOLHDL 3.6  VLDL 19  LDLCALC 100*   HgbA1c:  Recent Labs  Lab 11/08/20 0246  HGBA1C 6.3*    IMAGING past 24 hours CT ANGIO HEAD NECK W WO CM  Result Date: 11/08/2020 CLINICAL DATA:  Stroke/TIA, assess intracranial arteries EXAM: CT ANGIOGRAPHY HEAD AND NECK TECHNIQUE: Multidetector CT imaging of the head and neck was performed using the standard protocol during bolus administration of intravenous contrast. Multiplanar CT image reconstructions and MIPs were obtained to evaluate the vascular anatomy. Carotid stenosis measurements (when applicable) are obtained utilizing NASCET criteria, using the distal internal carotid diameter as the denominator. CONTRAST:  63m OMNIPAQUE IOHEXOL 350 MG/ML SOLN COMPARISON:  MRI of the same day.  CT head from 11/07/2020. FINDINGS: CT HEAD FINDINGS Brain: Small right cerebellar infarcts better characterized on same-day MRI. No evidence of interval acute large vascular territory infarct, acute hemorrhage, mass lesion, midline shift, hydrocephalus, or extra-axial fluid collection. Small remote lacunar infarcts also better seen on prior MRI. Patchy white matter hypoattenuation, nonspecific but compatible with chronic microvascular ischemic disease Vascular: See below Skull: No acute fracture. Sinuses: Clear sinuses. Orbits: See same day MRI orbits for more sensitive evaluation. Review of the MIP images confirms the above findings CTA NECK FINDINGS Aortic arch: Atherosclerosis.  Great vessel origins are patent. Right carotid system: Mild atherosclerosis without significant (greater than 50%) stenosis. Retropharyngeal course of the ICA. Left carotid system: Moderate atherosclerosis at the carotid bifurcation without  greater than 50% stenosis. Vertebral arteries: Multifocal severe stenosis of the right vertebral artery with occlusion at the C2-C3 level. Skeleton: Other neck: No acute abnormality. Upper chest: Biapical pleural-parenchymal scarring. Otherwise, visualized lung apices are clear. Review of the MIP images confirms the above findings CTA HEAD FINDINGS Anterior circulation: Bilateral intracranial ICAs are patent with mild narrowing due to calcific atherosclerosis. Severe narrowing of the distal M1 and proximal M2 MCA branches bilaterally. Bilateral A1 ACAs are patent. Severe stenosis of the left A2 ACA. Mild right A2 ACA stenosis. Posterior circulation: Irregular opacification of a small right V3/V4 vertebral artery from muscular collaterals. Moderate stenosis of the proximal left intradural vertebral artery. Severe stenosis of the mid and distal basilar artery. Severe stenosis of the right P2 PCA. Small left P1 PCA with severe stenosis of the proximal left P2 PCA. Venous sinuses: As permitted by contrast timing, patent. Review of the MIP images confirms the above findings IMPRESSION: CT head: 1. Small inferior right cerebellar infarcts better seen on same-day MRI. 2. No evidence of interval acute abnormality. CTA: 1. Severe stenosis of the mid and distal basilar artery and bilateral P2 PCAs. 2. Severe stenosis of the  distal M1 and proximal M2 MCA branches bilaterally. 3. Severe stenosis of the left A2 ACA. 4. Multifocal severe stenosis of the right vertebral artery in the neck with occlusion at the C2-C3 level and irregular opacification of a small V3/V4 vertebral artery from collaterals. 5. Moderate stenosis of the intradural left vertebral artery. 6. Bilateral carotid bifurcation atherosclerosis without greater than 50% stenosis. Electronically Signed   By: Margaretha Sheffield M.D.   On: 11/08/2020 18:41   MR Brain W and Wo Contrast  Addendum Date: 11/08/2020   ADDENDUM REPORT: 11/08/2020 12:45 ADDENDUM: MRI brain  impressions 2 and 3, and MRI orbits impression 2 discussed with Dr. Langston Masker of the emergency department by telephone at 12:40 p.m. on 11/08/2020. Electronically Signed   By: Kellie Simmering D.O.   On: 11/08/2020 12:45   Result Date: 11/08/2020 CLINICAL DATA:  Vision loss, binocular; right-sided complete vision loss in setting of sinus infection, abducent nerve palsy right eye, differential including GCA versus cavernous thrombosis/infection. EXAM: MRI HEAD AND ORBITS WITHOUT AND WITH CONTRAST TECHNIQUE: Multiplanar, multiecho pulse sequences of the brain and surrounding structures were obtained without and with intravenous contrast. Multiplanar, multiecho pulse sequences of the orbits and surrounding structures were obtained including fat saturation techniques, before and after intravenous contrast administration. CONTRAST:  48m GADAVIST GADOBUTROL 1 MMOL/ML IV SOLN COMPARISON:  Head CT 11/07/2020. FINDINGS: MRI HEAD FINDINGS Mild intermittent motion degradation. Brain: Mild for age generalized cerebral and cerebellar atrophy. There are two subcentimeter acute infarcts within the inferior right cerebellar hemisphere (for instance as seen on series 2, images 8 and 9). Chronic lacunar infarcts within the left cerebral hemispheric white matter, bilateral basal ganglia and bilateral thalami. Background moderate multifocal T2/FLAIR hyperintensity within the cerebral white matter and pons, nonspecific but compatible with chronic small vessel ischemic disease. Chronic microhemorrhage within the periventricular left frontal lobe (series 7, image 71). No evidence of an intracranial mass. No extra-axial fluid collection. No midline shift. Partially empty sella turcica. No pathologic intracranial enhancement identified. Vascular: Signal abnormality within the right vertebral artery at the level of the foramen magnum suggesting high-grade stenosis or vessel occlusion at this site (series 5, image 3). Skull and upper cervical  spine: No focal suspicious marrow lesion. MRI ORBITS FINDINGS Mild intermittent motion degradation. Orbits: Bilateral lens replacements. The globes are normal in size and contour. The extraocular muscles are symmetric and unremarkable. There is abnormal enhancement along the optic nerve sheaths bilaterally, compatible with bilateral optic nerve perineuritis. No intraorbital mass identified. Visualized sinuses: Trace mucosal thickening within the bilateral ethmoid air cells. Soft tissues: The visualized maxillofacial and upper neck soft tissues are unremarkable. IMPRESSION: MRI brain: 1. Mildly motion degraded exam. 2. Two subcentimeter acute infarcts within the inferior right cerebellar hemisphere. 3. Signal abnormality within the right vertebral artery at the level of the foramen magnum, suggesting high-grade stenosis or vessel occlusion at this site. 4. Chronic lacunar infarcts within the left cerebral hemispheric white matter, bilateral basal ganglia, and bilateral thalami. Background moderate chronic small vessel ischemic changes within the cerebral white matter, and within the pons. 5. Mild generalized parenchymal atrophy. MRI orbits: 1. Mildly motion degraded exam. 2. Abnormal enhancement along the optic nerve sheaths bilaterally, compatible with bilateral optic nerve perineuritis. 3. Minimal mucosal thickening within the bilateral ethmoid air cells. Electronically Signed: By: KKellie SimmeringD.O. On: 11/08/2020 12:31   MR ORBITS W WO CONTRAST  Addendum Date: 11/08/2020   ADDENDUM REPORT: 11/08/2020 12:45 ADDENDUM: MRI brain impressions 2 and 3, and MRI  orbits impression 2 discussed with Dr. Langston Masker of the emergency department by telephone at 12:40 p.m. on 11/08/2020. Electronically Signed   By: Kellie Simmering D.O.   On: 11/08/2020 12:45   Result Date: 11/08/2020 CLINICAL DATA:  Vision loss, binocular; right-sided complete vision loss in setting of sinus infection, abducent nerve palsy right eye, differential  including GCA versus cavernous thrombosis/infection. EXAM: MRI HEAD AND ORBITS WITHOUT AND WITH CONTRAST TECHNIQUE: Multiplanar, multiecho pulse sequences of the brain and surrounding structures were obtained without and with intravenous contrast. Multiplanar, multiecho pulse sequences of the orbits and surrounding structures were obtained including fat saturation techniques, before and after intravenous contrast administration. CONTRAST:  38m GADAVIST GADOBUTROL 1 MMOL/ML IV SOLN COMPARISON:  Head CT 11/07/2020. FINDINGS: MRI HEAD FINDINGS Mild intermittent motion degradation. Brain: Mild for age generalized cerebral and cerebellar atrophy. There are two subcentimeter acute infarcts within the inferior right cerebellar hemisphere (for instance as seen on series 2, images 8 and 9). Chronic lacunar infarcts within the left cerebral hemispheric white matter, bilateral basal ganglia and bilateral thalami. Background moderate multifocal T2/FLAIR hyperintensity within the cerebral white matter and pons, nonspecific but compatible with chronic small vessel ischemic disease. Chronic microhemorrhage within the periventricular left frontal lobe (series 7, image 71). No evidence of an intracranial mass. No extra-axial fluid collection. No midline shift. Partially empty sella turcica. No pathologic intracranial enhancement identified. Vascular: Signal abnormality within the right vertebral artery at the level of the foramen magnum suggesting high-grade stenosis or vessel occlusion at this site (series 5, image 3). Skull and upper cervical spine: No focal suspicious marrow lesion. MRI ORBITS FINDINGS Mild intermittent motion degradation. Orbits: Bilateral lens replacements. The globes are normal in size and contour. The extraocular muscles are symmetric and unremarkable. There is abnormal enhancement along the optic nerve sheaths bilaterally, compatible with bilateral optic nerve perineuritis. No intraorbital mass identified.  Visualized sinuses: Trace mucosal thickening within the bilateral ethmoid air cells. Soft tissues: The visualized maxillofacial and upper neck soft tissues are unremarkable. IMPRESSION: MRI brain: 1. Mildly motion degraded exam. 2. Two subcentimeter acute infarcts within the inferior right cerebellar hemisphere. 3. Signal abnormality within the right vertebral artery at the level of the foramen magnum, suggesting high-grade stenosis or vessel occlusion at this site. 4. Chronic lacunar infarcts within the left cerebral hemispheric white matter, bilateral basal ganglia, and bilateral thalami. Background moderate chronic small vessel ischemic changes within the cerebral white matter, and within the pons. 5. Mild generalized parenchymal atrophy. MRI orbits: 1. Mildly motion degraded exam. 2. Abnormal enhancement along the optic nerve sheaths bilaterally, compatible with bilateral optic nerve perineuritis. 3. Minimal mucosal thickening within the bilateral ethmoid air cells. Electronically Signed: By: KKellie SimmeringD.O. On: 11/08/2020 12:31    PHYSICAL EXAM Physical Exam  Constitutional: Appears well-developed and well-nourished.  Psych: Affect appropriate to situation Eyes: Normal external eye and conjunctiva. HENT: Normocephalic, no lesions, without obvious abnormality.   Musculoskeletal-no joint tenderness, deformity or swelling Cardiovascular: Normal rate and regular rhythm.  Respiratory: Effort normal, non-labored breathing saturations WNL GI: Soft.  No distension. There is no tenderness.  Skin: WD incision over right temporal artery   Neuro:  Mental Status: Alert, oriented name/age/month/ year thought content appropriate.  Speech fluent without evidence of aphasia.  Able to follow  commands without difficulty. Cranial Nerves: II: VFF in left eye only, right eye with vision impairment. Unable to differentiate objects. She can tell something is moving, but can not  identify color or object.  III,IV, VI: ptosis not present, extra-ocular motions intact bilaterally pupils equal, round, reactive to light and accommodation V,VII: smile symmetric, facial light touch sensation normal bilaterally VIII: hearing normal bilaterally IX,X: uvula rises symmetrically XI: bilateral shoulder shrug XII: midline tongue extension Motor: Able to raise all extremities anti gravity. Tone and bulk:normal tone throughout; no atrophy noted Sensory: light touch intact throughout, bilaterally Cerebellar: No ataxia noted Gait: deferred     ASSESSMENT/PLAN Jenna Long is a 86 y.o. female with a medical history significant for type 2 diabetes mellitus, essential hypertension, and hypercholesteremia who presented to the ED for evaluation of right eye vision loss. Patient states that about 1.5 months ago, she began to notice inner ear and sinus problems but that her inner ear problems went away and her sinus congestion on her left face began to feel better. She noted that the sinus congestion recurred approximately 2 weeks ago with associated temporal pulsatile discomfort and visual disturbance described as a film over her right eye with right eye stinging and irritation over the past 5 days. Due to progressive loss of vision in the right eye to complete vision loss, she saw her optometrist yesterday afternoon. After evaluation, he recommended that she be seen by Dr. Katy Fitch for evaluation but she was unable to make the appointment on time and she instead came to the ED for further evaluation. Patient also notes that she has had poor PO intake and she has not been able to hold food down at home. She also states that she tried eating her lunch while in the hospital but vomited it up immediately. Work up in the ED revealed a CRP of 8.6 with a sed rate of 111. Imaging revealed two incidental acute right cerebellar infarctions, signal abnormality within the right vertebral artery suggestive of high-grade stenosis or  vessel occlusion, and abnormal enhancement along the optic nerve sheaths compatible with bilateral optic nerve perineuritis and neurology was consulted for further evaluation and management  Temporal arteritis Clinical symptoms of headache and vision loss, but denies jaw claudication, shoulder pain, fever ESR 111, CRP 8.6 Temporal artery biopsy done, result pending On Solu-Medrol 1 g daily x3 days, followed by 60 mg prednisone daily On PPI Glucose monitoring in p.m. patient  Stroke:  right cerebellar infarct secondary to severe right vertebral artery stenosis.  CT head . Small inferior right cerebellar infarcts better seen on same-day MRI. 2. No evidence of interval acute abnormality. CTA head & neck 1. Severe stenosis of the mid and distal basilar artery and bilateral P2 PCAs.2. Severe stenosis of the distal M1 and proximal M2 MCA branches bilaterally. 3. Severe stenosis of the left A2 ACA. 4. Multifocal severe stenosis of the right vertebral artery in the neck with occlusion at the C2-C3 level and irregular opacification of a small V3/V4 vertebral artery from collaterals. 5. Moderate stenosis of the intradural left vertebral artery. 6. Bilateral carotid bifurcation atherosclerosis without greater than 50% stenosis. MRI  Two subcentimeter acute infarcts within the inferior right cerebellar hemisphere., Chronic lacunar infarcts within the left cerebral hemispheric white matter, bilateral basal ganglia, and bilateral thalami. 2D Echo pending VAS [place] temporal artery Bilateral: pending LDL 100 HgbA1c 6.3 VTE prophylaxis - lovenox No antithrombotic prior to admission, now on aspirin 81 mg daily and clopidogrel 75 mg daily.  Continue ASA and Plavix for 3 weeks and then ASA alone.  Therapy recommendations:  pending Disposition:  pending  Hypertension Home meds:  amlodipine Stable Permissive hypertension (OK if < 220/120)  but gradually normalize in 5-7 days Long-term BP goal  normotensive  Hyperlipidemia Home meds:  pravastatin 40 mg daily ,  LDL 100, goal < 70 Consider changing to High intensity statin like Atorvastatin Continue statin at discharge  Diabetes type II Controlled Home meds:  januvia, metformin HgbA1c 6.3, goal < 7.0 CBGs Recent Labs    11/08/20 1621 11/08/20 2359 11/09/20 0638  GLUCAP 250* 246* 234*    SSI  Other Stroke Risk Factors Advanced Age >/= 22   Other Active Problems Temporal arteritis with right eye vision impairment>>> temporal artery biopsy 11/09/20 results pending.    Hospital day # 1  Laurey Morale, MSN, NP-C [facility] [phone]   To contact Stroke Continuity provider, please refer to [email or web address] After hours, contact General Neurology

## 2020-11-09 NOTE — Anesthesia Preprocedure Evaluation (Signed)
Anesthesia Evaluation  Patient identified by MRN, date of birth, ID band Patient awake    Reviewed: Allergy & Precautions, NPO status , Patient's Chart, lab work & pertinent test results  Airway Mallampati: I  TM Distance: >3 FB Neck ROM: Full    Dental  (+) Teeth Intact, Dental Advisory Given   Pulmonary neg pulmonary ROS,    breath sounds clear to auscultation       Cardiovascular hypertension, Pt. on medications  Rhythm:Regular Rate:Normal     Neuro/Psych negative neurological ROS  negative psych ROS   GI/Hepatic negative GI ROS, Neg liver ROS,   Endo/Other  diabetes, Type 2, Oral Hypoglycemic Agents  Renal/GU negative Renal ROS     Musculoskeletal negative musculoskeletal ROS (+)   Abdominal Normal abdominal exam  (+)   Peds  Hematology negative hematology ROS (+)   Anesthesia Other Findings   Reproductive/Obstetrics                             Anesthesia Physical Anesthesia Plan  ASA: 3  Anesthesia Plan: MAC   Post-op Pain Management:    Induction: Intravenous  PONV Risk Score and Plan: 0 and Propofol infusion and Ondansetron  Airway Management Planned: Natural Airway and Simple Face Mask  Additional Equipment: None  Intra-op Plan:   Post-operative Plan:   Informed Consent: I have reviewed the patients History and Physical, chart, labs and discussed the procedure including the risks, benefits and alternatives for the proposed anesthesia with the patient or authorized representative who has indicated his/her understanding and acceptance.   Patient has DNR.  Discussed DNR with patient and Continue DNR.     Plan Discussed with: CRNA  Anesthesia Plan Comments:         Anesthesia Quick Evaluation

## 2020-11-09 NOTE — Op Note (Signed)
DATE OF SERVICE: 11/09/2020  PATIENT:  Jenna Long  86 y.o. female  PRE-OPERATIVE DIAGNOSIS:  concern for temporal arteritis  POST-OPERATIVE DIAGNOSIS:  Same  PROCEDURE:   Right temporal artery biopsy  SURGEON:  Surgeon(s) and Role:    * Cherre Robins, MD - Primary  ASSISTANT: none  ANESTHESIA:   local and MAC  EBL: min  BLOOD ADMINISTERED:none  DRAINS: none   LOCAL MEDICATIONS USED:  LIDOCAINE   SPECIMEN:  none  COUNTS: confirmed correct.  TOURNIQUET:  none  PATIENT DISPOSITION:  PACU - hemodynamically stable.   Delay start of Pharmacological VTE agent (>24hrs) due to surgical blood loss or risk of bleeding: no  INDICATION FOR PROCEDURE: [name] is a 86 y.o. female with right eye visual loss and headache. After careful discussion of risks, benefits, and alternatives the patient was offered temporal artery biopsy.  The patient  understood and wished to proceed.  OPERATIVE FINDINGS: 2-3 cm segment of right temporal artery excised.  DESCRIPTION OF PROCEDURE: After identification of the patient in the pre-operative holding area, the patient was transferred to the operating room. The patient was positioned supine on the operating room table. Anesthesia was induced. The right temple was prepped and draped in standard fashion. A surgical pause was performed confirming correct patient, procedure, and operative location.  Using intraoperative ultrasound the course of the right temporal artery was mapped on the skin.  A longitudinal incision was made over the temporal artery and carried down through subtenons tissue until the artery was visualized.  A 3 cm segment of the artery was skeletonized and clamped proximally distally.  The segment was excised and passed off the table for pathologic evaluation.  3-0 silk suture was used to ligate the temporal artery proximally distally.  Hemostasis was achieved in the surgical bed.  The wound was irrigated.  The wound was closed  in layers using 3-0 Vicryl and 4-0 Monocryl.  Dermabond was applied.  Upon completion of the case instrument and sharps counts were confirmed correct. The patient was transferred to the PACU in good condition. I was present for all portions of the procedure.  Yevonne Aline. Stanford Breed, MD [facility] Phone Number: [phone] 11/09/2020 11:49 AM

## 2020-11-09 NOTE — Progress Notes (Signed)
PROGRESS NOTE    Jenna Long  KAJ:[id number] DOB: [birth year] DOA: 11/07/2020 PCP: Sharilyn Sites, MD   Brief Narrative: Taken from H&P. Jenna Long is a 86 y.o. female with medical history significant of HTN; HLD; and DM presenting with R eye vision loss. She has had inner ear and sinus problems.  The inner ear went away but the sinus stopped her head up.  When it started again, she noticed a film over her R eye.  She called Dr. Rosana Hoes and made an appointment for Monday at 3:15.  She was concerned that she had lost her eyesight.  Dr. Rosana Hoes understood what was going on and called Dr. Katy Fitch for an appointment at 5pm  They were late for the appointment and so they came to the ER.  It has not changed in the ER.  The film has gone but the vision is completely gone -0 can not even see light.  No headache but she has had temporal discomfort for about 2 weeks since her sinuses got stopped up.  She gets pulsating pains in her R temporal region and goes to lie down until they improve. Elevated inflammatory markers.  COVID-19 negative.  MRI with new small right cerebellar infarct. 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  Concern of giant cell arteritis-patient underwent right temporal artery biopsy today.  She was also started on high-dose steroid.  Subjective: Patient was seen and examined after the biopsy today.  Denies any pain.  Continues to have no vision in right eye.  She wants to go home.  Assessment & Plan:   Principal Problem:   Temporal arteritis ([place]) Active Problems:   Essential hypertension   Mixed hyperlipidemia   Diabetes mellitus type 2 in nonobese (HCC)   DNR (do not resuscitate)  Right vision loss.  Patient also had subacute temporal pain.  Recent history of sinus congestion.  Elevated CRP.  Concern of giant cell arteritis/temporal arteritis. Vascular surgery was consulted and patient underwent successful temporal artery biopsy today. 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  2 small acute infarcts involving the inferior right cerebellar hemisphere.  Chronic lacunar infarcts. CTA with moderate to severe stenosis involving multiple vessels which include basilar, bilateral P2 PCAs.  Severe stenosis of M1 and M2 of MCA bilaterally.  Severe stenosis of left ACA.  Severe stenosis of right vertebral artery and moderate stenosis of left vertebral artery.  Moderate stenosis of carotid bifurcation. -Continue with high-dose Solu-Medrol-for total of 3 days. -Patient will need a steroid taper. -Follow-up temporal artery biopsy results. -Continue Trusopt in B eyes for now (glaucoma)  Acute small inferior right cerebellar infarcts.  CTA with moderate to severe stenosis involving multiple vessels.  Lipid panel with LDL of 100, goal of less than 70. -Continue with statin  Type 2 diabetes mellitus.  On Glucophage and Januvia at home.  Well-controlled with A1c of 6.3. -Continue with SSI  Hypertension.  Blood pressure mildly elevated. -Permissive hypertension for another 24-hour for concern of acute infarct.  Objective: Vitals:   11/09/20 1105 11/09/20 1120 11/09/20 1142 11/09/20 1605  BP: 126/85 (!) 152/74 (!) 149/73 (!) 168/69  Pulse: 77 72 76 83  Resp: 16 18 20 16   Temp: (!) 97.5 F (36.4 C) 97.8 F (36.6 C)    TempSrc:   Oral Oral  SpO2: 95% 98% 100% 96%  Weight:      Height:        Intake/Output Summary (Last 24 hours) at 11/09/2020 1615 Last  data filed at 11/09/2020 1056 Gross per 24 hour  Intake 724.21 ml  Output 5 ml  Net 719.21 ml   Filed Weights   11/08/20 2033 11/08/20 2304 11/09/20 0928  Weight: 68.5 kg 60 kg 60 kg    Examination:  General exam: Appears calm and comfortable, right temporal region with some surgical stitches. Respiratory system: Clear to auscultation. Respiratory effort normal. Cardiovascular system: S1 & S2 heard, RRR.  Gastrointestinal system: Soft, nontender, nondistended, bowel sounds positive. Central  nervous system: Alert and oriented. No focal neurological deficits. Extremities: No edema, no cyanosis, pulses intact and symmetrical. Psychiatry: Judgement and insight appear normal.   DVT prophylaxis: Lovenox Code Status: DNR Family Communication: Discussed with daughter at bedside Disposition Plan:  Status is: Inpatient  Remains inpatient appropriate because:Inpatient level of care appropriate due to severity of illness  Dispo: The patient is from: Home              Anticipated d/c is to: Home              Patient currently is not medically stable to d/c.   Difficult to place patient No              Level of care: Telemetry Medical  All the records are reviewed and case discussed with Care Management/Social Worker. Management plans discussed with the patient, nursing and they are in agreement.  Consultants:  Neurology Vascular surgery  Procedures:  Right temporal artery biopsy  Antimicrobials:   Data Reviewed: I have personally reviewed following labs and imaging studies  CBC: Recent Labs  Lab 11/07/20 2046 11/07/20 2137 11/09/20 0144  WBC 6.7  --  3.3*  NEUTROABS 4.1  --   --   HGB 10.0* 10.9* 10.0*  HCT 32.2* 32.0* 31.5*  MCV 89.7  --  86.5  PLT 516*  --  408*   Basic Metabolic Panel: Recent Labs  Lab 11/07/20 2046 11/07/20 2137 11/09/20 0144  NA 134* 136 133*  K 4.3 4.3 4.0  CL 99 100 99  CO2 24  --  24  GLUCOSE 205* 207* 252*  BUN 18 19 13   CREATININE 0.81 0.80 0.63  CALCIUM 9.3  --  9.0   GFR: Estimated Creatinine Clearance: 49.6 mL/min (by C-G formula based on SCr of 0.63 mg/dL). Liver Function Tests: Recent Labs  Lab 11/07/20 2046  AST 13*  ALT 13  ALKPHOS 81  BILITOT 0.5  PROT 8.0  ALBUMIN 2.9*   No results for input(s): LIPASE, AMYLASE in the last 168 hours. No results for input(s): AMMONIA in the last 168 hours. Coagulation Profile: Recent Labs  Lab 11/07/20 2046  INR 1.1   Cardiac Enzymes: No results for input(s):  CKTOTAL, CKMB, CKMBINDEX, TROPONINI in the last 168 hours. BNP (last 3 results) No results for input(s): PROBNP in the last 8760 hours. HbA1C: Recent Labs    11/08/20 0246  HGBA1C 6.3*   CBG: Recent Labs  Lab 11/09/20 0638 11/09/20 0912 11/09/20 1106 11/09/20 1139 11/09/20 1605  GLUCAP 234* 244* 249* 251* 192*   Lipid Profile: Recent Labs    11/09/20 0144  CHOL 165  HDL 46  LDLCALC 100*  TRIG 96  CHOLHDL 3.6   Thyroid Function Tests: No results for input(s): TSH, T4TOTAL, FREET4, T3FREE, THYROIDAB in the last 72 hours. Anemia Panel: No results for input(s): VITAMINB12, FOLATE, FERRITIN, TIBC, IRON, RETICCTPCT in the last 72 hours. Sepsis Labs: No results for input(s): PROCALCITON, LATICACIDVEN in the last 168 hours.  Recent Results (from the past 240 hour(s))  SARS CORONAVIRUS 2 ([name] 6-24 HRS) Nasopharyngeal Nasopharyngeal Swab     Status: None   Collection Time: 11/08/20 10:18 AM   Specimen: Nasopharyngeal Swab  Result Value Ref Range Status   SARS Coronavirus 2 NEGATIVE NEGATIVE Final    Comment: (NOTE) SARS-CoV-2 target nucleic acids are NOT DETECTED.  The SARS-CoV-2 RNA is generally detectable in upper and lower respiratory specimens during the acute phase of infection. Negative results do not preclude SARS-CoV-2 infection, do not rule out co-infections with other pathogens, and should not be used as the sole basis for treatment or other patient management decisions. Negative results must be combined with clinical observations, patient history, and epidemiological information. The expected result is Negative.  Fact Sheet for Patients: SugarRoll.be  Fact Sheet for Healthcare Providers: https://www.woods-mathews.com/  This test is not yet approved or cleared by the [place] FDA and  has been authorized for detection and/or diagnosis of SARS-CoV-2 by FDA under an Emergency Use Authorization (EUA). This EUA  will remain  in effect (meaning this test can be used) for the duration of the COVID-19 declaration under Se ction 564(b)(1) of the Act, 21 U.S.C. section 360bbb-3(b)(1), unless the authorization is terminated or revoked sooner.  Performed at [facility], [place] [address] [place], [place] [address]      Radiology Studies: CT ANGIO HEAD NECK W WO CM  Result Date: 11/08/2020 CLINICAL DATA:  Stroke/TIA, assess intracranial arteries EXAM: CT ANGIOGRAPHY HEAD AND NECK TECHNIQUE: Multidetector CT imaging of the head and neck was performed using the standard protocol during bolus administration of intravenous contrast. Multiplanar CT image reconstructions and MIPs were obtained to evaluate the vascular anatomy. Carotid stenosis measurements (when applicable) are obtained utilizing NASCET criteria, using the distal internal carotid diameter as the denominator. CONTRAST:  59mL OMNIPAQUE IOHEXOL 350 MG/ML SOLN COMPARISON:  MRI of the same day.  CT head from 11/07/2020. FINDINGS: CT HEAD FINDINGS Brain: Small right cerebellar infarcts better characterized on same-day MRI. No evidence of interval acute large vascular territory infarct, acute hemorrhage, mass lesion, midline shift, hydrocephalus, or extra-axial fluid collection. Small remote lacunar infarcts also better seen on prior MRI. Patchy white matter hypoattenuation, nonspecific but compatible with chronic microvascular ischemic disease Vascular: See below Skull: No acute fracture. Sinuses: Clear sinuses. Orbits: See same day MRI orbits for more sensitive evaluation. Review of the MIP images confirms the above findings CTA NECK FINDINGS Aortic arch: Atherosclerosis.  Great vessel origins are patent. Right carotid system: Mild atherosclerosis without significant (greater than 50%) stenosis. Retropharyngeal course of the ICA. Left carotid system: Moderate atherosclerosis at the carotid bifurcation without greater than 50% stenosis. Vertebral arteries:  Multifocal severe stenosis of the right vertebral artery with occlusion at the C2-C3 level. Skeleton: Other neck: No acute abnormality. Upper chest: Biapical pleural-parenchymal scarring. Otherwise, visualized lung apices are clear. Review of the MIP images confirms the above findings CTA HEAD FINDINGS Anterior circulation: Bilateral intracranial ICAs are patent with mild narrowing due to calcific atherosclerosis. Severe narrowing of the distal M1 and proximal M2 MCA branches bilaterally. Bilateral A1 ACAs are patent. Severe stenosis of the left A2 ACA. Mild right A2 ACA stenosis. Posterior circulation: Irregular opacification of a small right V3/V4 vertebral artery from muscular collaterals. Moderate stenosis of the proximal left intradural vertebral artery. Severe stenosis of the mid and distal basilar artery. Severe stenosis of the right P2 PCA. Small left P1 PCA with severe stenosis of the proximal left P2  PCA. Venous sinuses: As permitted by contrast timing, patent. Review of the MIP images confirms the above findings IMPRESSION: CT head: 1. Small inferior right cerebellar infarcts better seen on same-day MRI. 2. No evidence of interval acute abnormality. CTA: 1. Severe stenosis of the mid and distal basilar artery and bilateral P2 PCAs. 2. Severe stenosis of the distal M1 and proximal M2 MCA branches bilaterally. 3. Severe stenosis of the left A2 ACA. 4. Multifocal severe stenosis of the right vertebral artery in the neck with occlusion at the C2-C3 level and irregular opacification of a small V3/V4 vertebral artery from collaterals. 5. Moderate stenosis of the intradural left vertebral artery. 6. Bilateral carotid bifurcation atherosclerosis without greater than 50% stenosis. Electronically Signed   By: Margaretha Sheffield M.D.   On: 11/08/2020 18:41   CT HEAD WO CONTRAST  Result Date: 11/07/2020 CLINICAL DATA:  Headache. EXAM: CT HEAD WITHOUT CONTRAST TECHNIQUE: Contiguous axial images were obtained from  the base of the skull through the vertex without intravenous contrast. COMPARISON:  Jun 27, 2020 FINDINGS: Brain: There is mild cerebral atrophy with widening of the extra-axial spaces and ventricular dilatation. There are areas of decreased attenuation within the white matter tracts of the supratentorial brain, consistent with microvascular disease changes. Vascular: No hyperdense vessel or unexpected calcification. Skull: Normal. Negative for fracture or focal lesion. Sinuses/Orbits: No acute finding. Other: None. IMPRESSION: 1. Generalized cerebral atrophy. 2. No acute intracranial abnormality. Electronically Signed   By: Virgina Norfolk M.D.   On: 11/07/2020 21:26   MR Brain W and Wo Contrast  Addendum Date: 11/08/2020   ADDENDUM REPORT: 11/08/2020 12:45 ADDENDUM: MRI brain impressions 2 and 3, and MRI orbits impression 2 discussed with Dr. Langston Masker of the emergency department by telephone at 12:40 p.m. on 11/08/2020. Electronically Signed   By: Kellie Simmering D.O.   On: 11/08/2020 12:45   Result Date: 11/08/2020 CLINICAL DATA:  Vision loss, binocular; right-sided complete vision loss in setting of sinus infection, abducent nerve palsy right eye, differential including GCA versus cavernous thrombosis/infection. EXAM: MRI HEAD AND ORBITS WITHOUT AND WITH CONTRAST TECHNIQUE: Multiplanar, multiecho pulse sequences of the brain and surrounding structures were obtained without and with intravenous contrast. Multiplanar, multiecho pulse sequences of the orbits and surrounding structures were obtained including fat saturation techniques, before and after intravenous contrast administration. CONTRAST:  52mL GADAVIST GADOBUTROL 1 MMOL/ML IV SOLN COMPARISON:  Head CT 11/07/2020. FINDINGS: MRI HEAD FINDINGS Mild intermittent motion degradation. Brain: Mild for age generalized cerebral and cerebellar atrophy. There are two subcentimeter acute infarcts within the inferior right cerebellar hemisphere (for instance as seen  on series 2, images 8 and 9). Chronic lacunar infarcts within the left cerebral hemispheric white matter, bilateral basal ganglia and bilateral thalami. Background moderate multifocal T2/FLAIR hyperintensity within the cerebral white matter and pons, nonspecific but compatible with chronic small vessel ischemic disease. Chronic microhemorrhage within the periventricular left frontal lobe (series 7, image 71). No evidence of an intracranial mass. No extra-axial fluid collection. No midline shift. Partially empty sella turcica. No pathologic intracranial enhancement identified. Vascular: Signal abnormality within the right vertebral artery at the level of the foramen magnum suggesting high-grade stenosis or vessel occlusion at this site (series 5, image 3). Skull and upper cervical spine: No focal suspicious marrow lesion. MRI ORBITS FINDINGS Mild intermittent motion degradation. Orbits: Bilateral lens replacements. The globes are normal in size and contour. The extraocular muscles are symmetric and unremarkable. There is abnormal enhancement along the optic nerve sheaths bilaterally,  compatible with bilateral optic nerve perineuritis. No intraorbital mass identified. Visualized sinuses: Trace mucosal thickening within the bilateral ethmoid air cells. Soft tissues: The visualized maxillofacial and upper neck soft tissues are unremarkable. IMPRESSION: MRI brain: 1. Mildly motion degraded exam. 2. Two subcentimeter acute infarcts within the inferior right cerebellar hemisphere. 3. Signal abnormality within the right vertebral artery at the level of the foramen magnum, suggesting high-grade stenosis or vessel occlusion at this site. 4. Chronic lacunar infarcts within the left cerebral hemispheric white matter, bilateral basal ganglia, and bilateral thalami. Background moderate chronic small vessel ischemic changes within the cerebral white matter, and within the pons. 5. Mild generalized parenchymal atrophy. MRI orbits:  1. Mildly motion degraded exam. 2. Abnormal enhancement along the optic nerve sheaths bilaterally, compatible with bilateral optic nerve perineuritis. 3. Minimal mucosal thickening within the bilateral ethmoid air cells. Electronically Signed: By: Kellie Simmering D.O. On: 11/08/2020 12:31   MR ORBITS W WO CONTRAST  Addendum Date: 11/08/2020   ADDENDUM REPORT: 11/08/2020 12:45 ADDENDUM: MRI brain impressions 2 and 3, and MRI orbits impression 2 discussed with Dr. Langston Masker of the emergency department by telephone at 12:40 p.m. on 11/08/2020. Electronically Signed   By: Kellie Simmering D.O.   On: 11/08/2020 12:45   Result Date: 11/08/2020 CLINICAL DATA:  Vision loss, binocular; right-sided complete vision loss in setting of sinus infection, abducent nerve palsy right eye, differential including GCA versus cavernous thrombosis/infection. EXAM: MRI HEAD AND ORBITS WITHOUT AND WITH CONTRAST TECHNIQUE: Multiplanar, multiecho pulse sequences of the brain and surrounding structures were obtained without and with intravenous contrast. Multiplanar, multiecho pulse sequences of the orbits and surrounding structures were obtained including fat saturation techniques, before and after intravenous contrast administration. CONTRAST:  65mL GADAVIST GADOBUTROL 1 MMOL/ML IV SOLN COMPARISON:  Head CT 11/07/2020. FINDINGS: MRI HEAD FINDINGS Mild intermittent motion degradation. Brain: Mild for age generalized cerebral and cerebellar atrophy. There are two subcentimeter acute infarcts within the inferior right cerebellar hemisphere (for instance as seen on series 2, images 8 and 9). Chronic lacunar infarcts within the left cerebral hemispheric white matter, bilateral basal ganglia and bilateral thalami. Background moderate multifocal T2/FLAIR hyperintensity within the cerebral white matter and pons, nonspecific but compatible with chronic small vessel ischemic disease. Chronic microhemorrhage within the periventricular left frontal lobe  (series 7, image 71). No evidence of an intracranial mass. No extra-axial fluid collection. No midline shift. Partially empty sella turcica. No pathologic intracranial enhancement identified. Vascular: Signal abnormality within the right vertebral artery at the level of the foramen magnum suggesting high-grade stenosis or vessel occlusion at this site (series 5, image 3). Skull and upper cervical spine: No focal suspicious marrow lesion. MRI ORBITS FINDINGS Mild intermittent motion degradation. Orbits: Bilateral lens replacements. The globes are normal in size and contour. The extraocular muscles are symmetric and unremarkable. There is abnormal enhancement along the optic nerve sheaths bilaterally, compatible with bilateral optic nerve perineuritis. No intraorbital mass identified. Visualized sinuses: Trace mucosal thickening within the bilateral ethmoid air cells. Soft tissues: The visualized maxillofacial and upper neck soft tissues are unremarkable. IMPRESSION: MRI brain: 1. Mildly motion degraded exam. 2. Two subcentimeter acute infarcts within the inferior right cerebellar hemisphere. 3. Signal abnormality within the right vertebral artery at the level of the foramen magnum, suggesting high-grade stenosis or vessel occlusion at this site. 4. Chronic lacunar infarcts within the left cerebral hemispheric white matter, bilateral basal ganglia, and bilateral thalami. Background moderate chronic small vessel ischemic changes within the cerebral white matter,  and within the pons. 5. Mild generalized parenchymal atrophy. MRI orbits: 1. Mildly motion degraded exam. 2. Abnormal enhancement along the optic nerve sheaths bilaterally, compatible with bilateral optic nerve perineuritis. 3. Minimal mucosal thickening within the bilateral ethmoid air cells. Electronically Signed: By: Kellie Simmering D.O. On: 11/08/2020 12:31    Scheduled Meds:  amLODipine  5 mg Oral Daily   aspirin EC  325 mg Oral Daily   clopidogrel  75  mg Oral Daily   docusate sodium  100 mg Oral BID   dorzolamide  1 drop Both Eyes BID   enoxaparin (LOVENOX) injection  40 mg Subcutaneous Daily   insulin aspart  0-15 Units Subcutaneous TID WC   pravastatin  40 mg Oral QHS   sodium chloride flush  3 mL Intravenous Q12H   Continuous Infusions:  sodium chloride 10 mL/hr at 11/09/20 0930   lactated ringers 75 mL/hr at 11/09/20 0458   methylPREDNISolone (SOLU-MEDROL) injection 1,000 mg (11/09/20 1209)     LOS: 1 day   Time spent: 45 minutes. More than 50% of the time was spent in counseling/coordination of care  Lorella Nimrod, MD [facility]  If 7PM-7AM, please contact night-coverage [email or web address]  11/09/2020, 4:15 PM   This record has been created using Systems analyst. Errors have been sought and corrected,but may not always be located. Such creation errors do not reflect on the standard of care.

## 2020-11-09 NOTE — Anesthesia Procedure Notes (Addendum)
Procedure Name: MAC Date/Time: 11/09/2020 10:10 AM Performed by: Michele Rockers, CRNA Pre-anesthesia Checklist: Patient identified, Emergency Drugs available, Suction available, Timeout performed and Patient being monitored Patient Re-evaluated:Patient Re-evaluated prior to induction Oxygen Delivery Method: Simple face mask

## 2020-11-09 NOTE — Plan of Care (Signed)
  Problem: Education: Goal: Knowledge of General Education information will improve Description Including pain rating scale, medication(s)/side effects and non-pharmacologic comfort measures Outcome: Progressing   

## 2020-11-09 NOTE — Transfer of Care (Signed)
Immediate Anesthesia Transfer of Care Note  Patient: Jenna Long  Procedure(s) Performed: BIOPSY TEMPORAL ARTERY (Right: Head)  Patient Location: PACU  Anesthesia Type:MAC  Level of Consciousness: awake, patient cooperative and responds to stimulation  Airway & Oxygen Therapy: Patient Spontanous Breathing  Post-op Assessment: Report given to RN and Post -op Vital signs reviewed and stable  Post vital signs: Reviewed and stable  Last Vitals:  Vitals Value Taken Time  BP 126/85 11/09/20 1105  Temp    Pulse 75 11/09/20 1106  Resp 16 11/09/20 1106  SpO2 97 % 11/09/20 1106  Vitals shown include unvalidated device data.  Last Pain:  Vitals:   11/09/20 0928  TempSrc: Oral  PainSc:       Patients Stated Pain Goal: 0 (71/27/87 1836)  Complications: No notable events documented.

## 2020-11-09 NOTE — Anesthesia Postprocedure Evaluation (Signed)
Anesthesia Post Note  Patient: Jenna Long  Procedure(s) Performed: BIOPSY TEMPORAL ARTERY (Right: Head)     Patient location during evaluation: PACU Anesthesia Type: MAC Level of consciousness: awake and alert Pain management: pain level controlled Vital Signs Assessment: post-procedure vital signs reviewed and stable Respiratory status: spontaneous breathing, nonlabored ventilation, respiratory function stable and patient connected to nasal cannula oxygen Cardiovascular status: stable and blood pressure returned to baseline Postop Assessment: no apparent nausea or vomiting Anesthetic complications: no   No notable events documented.  Last Vitals:  Vitals:   11/09/20 1120 11/09/20 1142  BP: (!) 152/74 (!) 149/73  Pulse: 72 76  Resp: 18 20  Temp: 36.6 C   SpO2: 98% 100%    Last Pain:  Vitals:   11/09/20 1142  TempSrc: Oral  PainSc:                  [name]

## 2020-11-09 NOTE — Interval H&P Note (Signed)
History and Physical Interval Note:  11/09/2020 10:00 AM  Jenna Long  has presented today for surgery, with the diagnosis of Temporal Arteritis.  The various methods of treatment have been discussed with the patient and family. After consideration of risks, benefits and other options for treatment, the patient has consented to  Procedure(s): BIOPSY TEMPORAL ARTERY (Right) as a surgical intervention.  The patient's history has been reviewed, patient examined, no change in status, stable for surgery.  I have reviewed the patient's chart and labs.  Questions were answered to the patient's satisfaction.     Cherre Robins

## 2020-11-10 ENCOUNTER — Inpatient Hospital Stay (HOSPITAL_COMMUNITY): Payer: PPO

## 2020-11-10 ENCOUNTER — Encounter (HOSPITAL_COMMUNITY): Payer: Self-pay | Admitting: Vascular Surgery

## 2020-11-10 DIAGNOSIS — I6389 Other cerebral infarction: Secondary | ICD-10-CM | POA: Diagnosis not present

## 2020-11-10 DIAGNOSIS — I10 Essential (primary) hypertension: Secondary | ICD-10-CM

## 2020-11-10 DIAGNOSIS — H539 Unspecified visual disturbance: Secondary | ICD-10-CM | POA: Diagnosis not present

## 2020-11-10 DIAGNOSIS — H5461 Unqualified visual loss, right eye, normal vision left eye: Secondary | ICD-10-CM | POA: Diagnosis not present

## 2020-11-10 DIAGNOSIS — E119 Type 2 diabetes mellitus without complications: Secondary | ICD-10-CM

## 2020-11-10 DIAGNOSIS — M316 Other giant cell arteritis: Secondary | ICD-10-CM | POA: Diagnosis not present

## 2020-11-10 DIAGNOSIS — R519 Headache, unspecified: Secondary | ICD-10-CM | POA: Diagnosis not present

## 2020-11-10 DIAGNOSIS — E782 Mixed hyperlipidemia: Secondary | ICD-10-CM

## 2020-11-10 DIAGNOSIS — I6330 Cerebral infarction due to thrombosis of unspecified cerebral artery: Secondary | ICD-10-CM | POA: Diagnosis not present

## 2020-11-10 LAB — GLUCOSE, CAPILLARY
Glucose-Capillary: 263 mg/dL — ABNORMAL HIGH (ref 70–99)
Glucose-Capillary: 209 mg/dL — ABNORMAL HIGH (ref 70–99)
Glucose-Capillary: 282 mg/dL — ABNORMAL HIGH (ref 70–99)
Glucose-Capillary: 209 mg/dL — ABNORMAL HIGH (ref 70–99)

## 2020-11-10 LAB — ECHOCARDIOGRAM COMPLETE
AR max vel: 2.6 cm2
AV Area VTI: 2.63 cm2
AV Area mean vel: 2.67 cm2
AV Mean grad: 10 mmHg
AV Peak grad: 19.7 mmHg
Ao pk vel: 2.22 m/s
Area-P 1/2: 2.12 cm2
Height: 67 in
S' Lateral: 2.5 cm
Single Plane A4C EF: 59.7 %
Weight: 2116.42 oz

## 2020-11-10 MED ORDER — PREDNISONE 20 MG PO TABS
60.0000 mg | ORAL_TABLET | Freq: Every day | ORAL | Status: DC
  Administered 2020-11-11 – 2020-11-18 (×8): 60 mg via ORAL
  Filled 2020-11-10 (×8): qty 3

## 2020-11-10 MED ORDER — AMLODIPINE BESYLATE 10 MG PO TABS
10.0000 mg | ORAL_TABLET | Freq: Every day | ORAL | Status: DC
  Administered 2020-11-10 – 2020-11-14 (×5): 10 mg via ORAL
  Filled 2020-11-10 (×5): qty 1

## 2020-11-10 MED ORDER — INSULIN GLARGINE-YFGN 100 UNIT/ML ~~LOC~~ SOLN
10.0000 [IU] | Freq: Every day | SUBCUTANEOUS | Status: DC
  Administered 2020-11-10 – 2020-11-11 (×2): 10 [IU] via SUBCUTANEOUS
  Filled 2020-11-10 (×2): qty 0.1

## 2020-11-10 NOTE — Evaluation (Signed)
Physical Therapy Evaluation Patient Details Name: Jenna Long MRN: [id number] DOB: [birth year] Today's Date: 11/10/2020  History of Present Illness  86 y/o female presented to ED on 9/20 for R vision loss. Was being sent to opthamologist, however office was closed prior to arrival and decided to come to ED. 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 MRI orbits revelaed abnormal enhancement along optic nerve sheaths bilaterally, compatible with bilateral optic nerve perineuritis. 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 PMH: HTN, HLD, DM  Clinical Impression  PTA, patient lives alone and reports independence with use of SPC. Patient states granddaughter will be coming to stay with patient at discharge. Patient presents with weakness, impaired coordination, decreased activity tolerance, impaired balance. Patient requires minA for mobility with use of SPC. Educated patient on use of RW for increased stability and safety, patient verbalized understanding and in agreement. Patient will benefit from skilled PT services during acute stay to address listed deficits. Recommend CIR following discharge to maximize functional independence and safety. Patient likely to refuse recommendation, if so recommend HHPT following discharge to assist with return to independence.        Recommendations for follow up therapy are one component of a multi-disciplinary discharge planning process, led by the attending physician.  Recommendations may be updated based on patient status, additional functional criteria and insurance authorization.  Follow Up Recommendations CIR (patient may refuse. If so, recommend HHPT and 24 hour supervision)    Equipment Recommendations  Rolling [place] with 5" wheels    Recommendations for Other Services       Precautions / Restrictions Precautions Precautions:  Fall Restrictions Weight Bearing Restrictions: No      Mobility  Bed Mobility Overal bed mobility: Needs Assistance Bed Mobility: Supine to Sit;Sit to Supine     Supine to sit: Min assist Sit to supine: Supervision   General bed mobility comments: minA for trunk elevation from flat bed    Transfers Overall transfer level: Needs assistance Equipment used: Straight cane Transfers: Sit to/from Stand Sit to Stand: Min assist         General transfer comment: minA to rise and steady  Ambulation/Gait Ambulation/Gait assistance: Min assist Gait Distance (Feet): 20 Feet Assistive device: Straight cane Gait Pattern/deviations: Step-to pattern;Decreased stride length;Ataxic;Wide base of support;Trunk flexed Gait velocity: decreased   General Gait Details: minA for balance. Mild ataxia noted. Patient reports feeling unsteady with mobility using cane. Educated patient on use of RW for increased stability for safety, patient verbalized understanding and in agreement.  Stairs            Wheelchair Mobility    Modified Rankin (Stroke Patients Only) Modified Rankin (Stroke Patients Only) Pre-Morbid Rankin Score: Slight disability Modified Rankin: Moderately severe disability     Balance Overall balance assessment: Needs assistance Sitting-balance support: No upper extremity supported;Feet supported Sitting balance-Leahy Scale: Good     Standing balance support: Single extremity supported;During functional activity Standing balance-Leahy Scale: Poor Standing balance comment: reliant on UE support and external assist                             Pertinent Vitals/Pain Pain Assessment: No/denies pain    Home Living Family/patient expects to be discharged to:: Private residence Living Arrangements: Alone Available Help at Discharge: Family;Available PRN/intermittently Type of Home: House Home Access: Stairs to enter Entrance Stairs-Rails: Right;Left;Can  reach both Entrance Stairs-Number of Steps: 3 Home Layout: One level Home Equipment: [place] - 2 wheels;Bedside commode;Cane - single point;Shower seat;Wheelchair - manual;Grab bars - tub/shower Additional Comments: bathes at sink due to tub being too tall to step over    Prior Function Level of Independence: Independent with assistive device(s)         Comments: Patient states independent with ADL and community ambulator with SPC. Performs grocery shopping, manages own medications. Patient states granddaughter will be coming to stay with her at [place]        Extremity/Trunk Assessment   Upper Extremity Assessment Upper Extremity Assessment: Defer to OT evaluation    Lower Extremity Assessment Lower Extremity Assessment: LLE deficits/detail;RLE deficits/detail;Generalized weakness RLE Coordination: decreased gross motor;decreased fine motor LLE Coordination: decreased fine motor;decreased gross motor    Cervical / Trunk Assessment Cervical / Trunk Assessment: Kyphotic  Communication   Communication: HOH  Cognition Arousal/Alertness: Awake/alert Behavior During Therapy: WFL for tasks assessed/performed Overall Cognitive Status: No family/caregiver present to determine baseline cognitive functioning                                        General Comments      Exercises     Assessment/Plan    PT Assessment Patient needs continued PT services  PT Problem List Decreased strength;Decreased balance;Decreased activity tolerance;Decreased coordination;Decreased mobility;Decreased safety awareness       PT Treatment Interventions DME instruction;Gait training;Stair training;Functional mobility training;Therapeutic activities;Therapeutic exercise;Balance training;Patient/family education    PT Goals (Current goals can be found in the Care Plan section)  Acute Rehab PT Goals Patient Stated Goal: to go home PT Goal Formulation: With  patient Time For Goal Achievement: 11/24/20 Potential to Achieve Goals: Good    Frequency Min 4X/week   Barriers to discharge        Co-evaluation               AM-PAC PT "6 Clicks" Mobility  Outcome Measure Help needed turning from your back to your side while in a flat bed without using bedrails?: A Little Help needed moving from lying on your back to sitting on the side of a flat bed without using bedrails?: A Little Help needed moving to and from a bed to a chair (including a wheelchair)?: A Little Help needed standing up from a chair using your arms (e.g., wheelchair or bedside chair)?: A Little Help needed to walk in hospital room?: A Little Help needed climbing 3-5 steps with a railing? : A Lot 6 Click Score: 17    End of Session Equipment Utilized During Treatment: Gait belt Activity Tolerance: Patient tolerated treatment well Patient left: in bed;with call bell/phone within reach;with nursing/sitter in room Nurse Communication: Mobility status PT Visit Diagnosis: Unsteadiness on feet (R26.81);Muscle weakness (generalized) (M62.81);Difficulty in walking, not elsewhere classified (R26.2)    Time: 8546-2703 PT Time Calculation (min) (ACUTE ONLY): 18 min   Charges:   PT Evaluation $PT Eval Moderate Complexity: 1 Mod          Dlisa Barnwell A. Gilford Rile PT, DPT Acute Rehabilitation Services Pager [phone] Office [phone]   Linna Hoff 11/10/2020, 5:29 PM

## 2020-11-10 NOTE — Progress Notes (Signed)
Temporal artery duplex completed. Refer to "CV Proc" under chart review to view preliminary results.  11/10/2020 11:59 AM Kelby Aline., MHA, RVT, RDCS, RDMS

## 2020-11-10 NOTE — Progress Notes (Signed)
Received call from tele notified that pt's HR= 151. HR came back to NSR. Pt asymptomatic. Call bell within reach.   Lavenia Atlas, RN

## 2020-11-10 NOTE — Progress Notes (Signed)
PROGRESS NOTE    Jenna Long  DPO:[id number] DOB: 10-14-1934 DOA: 11/07/2020 PCP: Sharilyn Sites, MD   Brief Narrative: Taken from H&P. Jenna Long is a 86 y.o. female with medical history significant of HTN; HLD; and DM presenting with R eye vision loss. She has had inner ear and sinus problems.  The inner ear went away but the sinus stopped her head up.  When it started again, she noticed a film over her R eye.  She called Dr. Rosana Hoes and made an appointment for Monday at 3:15.  She was concerned that she had lost her eyesight.  Dr. Rosana Hoes understood what was going on and called Dr. Katy Fitch for an appointment at 5pm  They were late for the appointment and so they came to the ER.  It has not changed in the ER.  The film has gone but the vision is completely gone -0 can not even see light.  No headache but she has had temporal discomfort for about 2 weeks since her sinuses got stopped up.  She gets pulsating pains in her R temporal region and goes to lie down until they improve. Elevated inflammatory markers.  COVID-19 negative.  MRI with new small right cerebellar infarct. 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  Concern of giant cell arteritis-patient underwent right temporal artery biopsy on 11/09/2020.  She was also started on high-dose steroid for 3 days-completed a course today. Temporal artery duplex with Halo sign on right which is consistent with temporal arteritis.  No sign on left.  Still no vision on right-can be a permanent vision loss as symptoms started approximately 2 weeks ago.  She will be started on prednisone at 1 mg/kg/day from tomorrow and will need a very slow taper, like 50 mg after 2 weeks, decreasing 10 mg every 2 weeks.  Subjective: Patient was seen and examined today.  Stating that she is feeling much improved.  No vision in right eye yet.  We discussed that might be permanent as her symptoms started 2 weeks ago and pulsed doses of steroids are more effective if  given earlier. Daughter at bedside.  Assessment & Plan:   Principal Problem:   Temporal arteritis ([place]) Active Problems:   Essential hypertension   Mixed hyperlipidemia   Diabetes mellitus type 2 in nonobese (HCC)   DNR (do not resuscitate)   Cerebral thrombosis with cerebral infarction  Right vision loss.  Patient also had subacute temporal pain.  Recent history of sinus congestion.  Elevated CRP.  Concern of giant cell arteritis/temporal arteritis. Vascular surgery was consulted and patient underwent successful temporal artery biopsy on 9/21, pending pathology results. 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  2 small acute infarcts involving the inferior right cerebellar hemisphere.  Chronic lacunar infarcts. Arterial duplex of temporal arteries positive for halo sign on right which is consistent with temporal arteritis.  Completed a 3-day course of high-dose steroid. -Start her on 60 mg of prednisone daily from tomorrow-she will need a very slow taper over the course of many weeks in the future.  Decreased 10 mg every 2-week.  She will also need a close follow-up by PCP and vascular surgery.  She will also need follow-up with ophthalmology. Most likely a permanent right sided vision loss. -Follow-up temporal artery biopsy results. -Continue Trusopt in B eyes for now (glaucoma)  Acute small inferior right cerebellar infarcts.  CTA with moderate to severe stenosis involving multiple vessels which include basilar, bilateral P2 PCAs.  Severe stenosis  of M1 and M2 of MCA bilaterally.  Severe stenosis of left ACA.  Severe stenosis of right vertebral artery and moderate stenosis of left vertebral artery.  Moderate stenosis of carotid bifurcation.   Lipid panel with LDL of 100, goal of less than 70. -Continue with statin  Type 2 diabetes mellitus.  On Glucophage and Januvia at home.  Well-controlled with A1c of 6.3. CBC elevated as she is on steroid. -Add long-acting 10 units  daily. -Continue with SSI  Hypertension.  Blood pressure mildly elevated. -Increase the home dose of amlodipine to 10 mg daily  Objective: Vitals:   11/09/20 2341 11/10/20 0404 11/10/20 0807 11/10/20 1207  BP: (!) 160/84 (!) 164/64 (!) 159/89 (!) 155/65  Pulse: 82 82 79 70  Resp: 16 14 16 17   Temp: 97.9 F (36.6 C) 97.8 F (36.6 C) 98.3 F (36.8 C) 97.6 F (36.4 C)  TempSrc: Oral Oral Oral Oral  SpO2: 97% 94% 99% 96%  Weight:      Height:        Intake/Output Summary (Last 24 hours) at 11/10/2020 1508 Last data filed at 11/10/2020 1259 Gross per 24 hour  Intake 611.54 ml  Output 1200 ml  Net -588.46 ml    Filed Weights   11/08/20 2033 11/08/20 2304 11/09/20 0928  Weight: 68.5 kg 60 kg 60 kg    Examination:  General.  Well-developed elderly lady, in no acute distress. Pulmonary.  Lungs clear bilaterally, normal respiratory effort. CV.  Regular rate and rhythm, no JVD, rub or murmur. Abdomen.  Soft, nontender, nondistended, BS positive. CNS.  Alert and oriented x3.  No focal neurologic deficit.  Right vision loss Extremities.  No edema, no cyanosis, pulses intact and symmetrical. Psychiatry.  Judgment and insight appears normal.   DVT prophylaxis: Lovenox Code Status: DNR Family Communication: Discussed with daughter at bedside Disposition Plan:  Status is: Inpatient  Remains inpatient appropriate because:Inpatient level of care appropriate due to severity of illness  Dispo: The patient is from: Home              Anticipated d/c is to: Home              Patient currently is not medically stable to d/c.   Difficult to place patient No              Level of care: Telemetry Medical  All the records are reviewed and case discussed with Care Management/Social Worker. Management plans discussed with the patient, nursing and they are in agreement.  Consultants:  Neurology Vascular surgery  Procedures:  Right temporal artery biopsy  Antimicrobials:   Data  Reviewed: I have personally reviewed following labs and imaging studies  CBC: Recent Labs  Lab 11/07/20 2046 11/07/20 2137 11/09/20 0144  WBC 6.7  --  3.3*  NEUTROABS 4.1  --   --   HGB 10.0* 10.9* 10.0*  HCT 32.2* 32.0* 31.5*  MCV 89.7  --  86.5  PLT 516*  --  457*    Basic Metabolic Panel: Recent Labs  Lab 11/07/20 2046 11/07/20 2137 11/09/20 0144  NA 134* 136 133*  K 4.3 4.3 4.0  CL 99 100 99  CO2 24  --  24  GLUCOSE 205* 207* 252*  BUN 18 19 13   CREATININE 0.81 0.80 0.63  CALCIUM 9.3  --  9.0    GFR: Estimated Creatinine Clearance: 49.6 mL/min (by C-G formula based on SCr of 0.63 mg/dL). Liver Function Tests: Recent Labs  Lab  11/07/20 2046  AST 13*  ALT 13  ALKPHOS 81  BILITOT 0.5  PROT 8.0  ALBUMIN 2.9*    No results for input(s): LIPASE, AMYLASE in the last 168 hours. No results for input(s): AMMONIA in the last 168 hours. Coagulation Profile: Recent Labs  Lab 11/07/20 2046  INR 1.1    Cardiac Enzymes: No results for input(s): CKTOTAL, CKMB, CKMBINDEX, TROPONINI in the last 168 hours. BNP (last 3 results) No results for input(s): PROBNP in the last 8760 hours. HbA1C: Recent Labs    11/08/20 0246  HGBA1C 6.3*    CBG: Recent Labs  Lab 11/09/20 1139 11/09/20 1605 11/09/20 2111 11/10/20 0833 11/10/20 1209  GLUCAP 251* 192* 226* 263* 209*    Lipid Profile: Recent Labs    11/09/20 0144  CHOL 165  HDL 46  LDLCALC 100*  TRIG 96  CHOLHDL 3.6    Thyroid Function Tests: No results for input(s): TSH, T4TOTAL, FREET4, T3FREE, THYROIDAB in the last 72 hours. Anemia Panel: No results for input(s): VITAMINB12, FOLATE, FERRITIN, TIBC, IRON, RETICCTPCT in the last 72 hours. Sepsis Labs: No results for input(s): PROCALCITON, LATICACIDVEN in the last 168 hours.  Recent Results (from the past 240 hour(s))  SARS CORONAVIRUS 2 ([name] 6-24 HRS) Nasopharyngeal Nasopharyngeal Swab     Status: None   Collection Time: 11/08/20 10:18 AM    Specimen: Nasopharyngeal Swab  Result Value Ref Range Status   SARS Coronavirus 2 NEGATIVE NEGATIVE Final    Comment: (NOTE) SARS-CoV-2 target nucleic acids are NOT DETECTED.  The SARS-CoV-2 RNA is generally detectable in upper and lower respiratory specimens during the acute phase of infection. Negative results do not preclude SARS-CoV-2 infection, do not rule out co-infections with other pathogens, and should not be used as the sole basis for treatment or other patient management decisions. Negative results must be combined with clinical observations, patient history, and epidemiological information. The expected result is Negative.  Fact Sheet for Patients: SugarRoll.be  Fact Sheet for Healthcare Providers: https://www.woods-mathews.com/  This test is not yet approved or cleared by the [place] FDA and  has been authorized for detection and/or diagnosis of SARS-CoV-2 by FDA under an Emergency Use Authorization (EUA). This EUA will remain  in effect (meaning this test can be used) for the duration of the COVID-19 declaration under Se ction 564(b)(1) of the Act, 21 U.S.C. section 360bbb-3(b)(1), unless the authorization is terminated or revoked sooner.  Performed at [facility], [place] [address], [place], [place] [address]       Radiology Studies: CT ANGIO HEAD NECK W WO CM  Result Date: 11/08/2020 CLINICAL DATA:  Stroke/TIA, assess intracranial arteries EXAM: CT ANGIOGRAPHY HEAD AND NECK TECHNIQUE: Multidetector CT imaging of the head and neck was performed using the standard protocol during bolus administration of intravenous contrast. Multiplanar CT image reconstructions and MIPs were obtained to evaluate the vascular anatomy. Carotid stenosis measurements (when applicable) are obtained utilizing NASCET criteria, using the distal internal carotid diameter as the denominator. CONTRAST:  96mL OMNIPAQUE IOHEXOL 350 MG/ML SOLN  COMPARISON:  MRI of the same day.  CT head from 11/07/2020. FINDINGS: CT HEAD FINDINGS Brain: Small right cerebellar infarcts better characterized on same-day MRI. No evidence of interval acute large vascular territory infarct, acute hemorrhage, mass lesion, midline shift, hydrocephalus, or extra-axial fluid collection. Small remote lacunar infarcts also better seen on prior MRI. Patchy white matter hypoattenuation, nonspecific but compatible with chronic microvascular ischemic disease Vascular: See below Skull: No acute fracture. Sinuses:  Clear sinuses. Orbits: See same day MRI orbits for more sensitive evaluation. Review of the MIP images confirms the above findings CTA NECK FINDINGS Aortic arch: Atherosclerosis.  Great vessel origins are patent. Right carotid system: Mild atherosclerosis without significant (greater than 50%) stenosis. Retropharyngeal course of the ICA. Left carotid system: Moderate atherosclerosis at the carotid bifurcation without greater than 50% stenosis. Vertebral arteries: Multifocal severe stenosis of the right vertebral artery with occlusion at the C2-C3 level. Skeleton: Other neck: No acute abnormality. Upper chest: Biapical pleural-parenchymal scarring. Otherwise, visualized lung apices are clear. Review of the MIP images confirms the above findings CTA HEAD FINDINGS Anterior circulation: Bilateral intracranial ICAs are patent with mild narrowing due to calcific atherosclerosis. Severe narrowing of the distal M1 and proximal M2 MCA branches bilaterally. Bilateral A1 ACAs are patent. Severe stenosis of the left A2 ACA. Mild right A2 ACA stenosis. Posterior circulation: Irregular opacification of a small right V3/V4 vertebral artery from muscular collaterals. Moderate stenosis of the proximal left intradural vertebral artery. Severe stenosis of the mid and distal basilar artery. Severe stenosis of the right P2 PCA. Small left P1 PCA with severe stenosis of the proximal left P2 PCA. Venous  sinuses: As permitted by contrast timing, patent. Review of the MIP images confirms the above findings IMPRESSION: CT head: 1. Small inferior right cerebellar infarcts better seen on same-day MRI. 2. No evidence of interval acute abnormality. CTA: 1. Severe stenosis of the mid and distal basilar artery and bilateral P2 PCAs. 2. Severe stenosis of the distal M1 and proximal M2 MCA branches bilaterally. 3. Severe stenosis of the left A2 ACA. 4. Multifocal severe stenosis of the right vertebral artery in the neck with occlusion at the C2-C3 level and irregular opacification of a small V3/V4 vertebral artery from collaterals. 5. Moderate stenosis of the intradural left vertebral artery. 6. Bilateral carotid bifurcation atherosclerosis without greater than 50% stenosis. Electronically Signed   By: Margaretha Sheffield M.D.   On: 11/08/2020 18:41   ECHOCARDIOGRAM COMPLETE  Result Date: 11/10/2020    ECHOCARDIOGRAM REPORT   Patient Name:   TAILER VOLKERT Date of Exam: 11/10/2020 Medical Rec #:  [id number]      Height:       67.0 in Accession #:    [id number]     Weight:       132.3 lb Date of Birth:  [birth year]       BSA:          1.696 m Patient Age:    58 years       BP:           160/84 mmHg Patient Gender: F              HR:           78 bpm. Exam Location:  Inpatient Procedure: 2D Echo, Cardiac Doppler and Color Doppler Indications:    CVA  History:        Patient has no prior history of Echocardiogram examinations.  Sonographer:    [place] Referring Phys: [id number] Leonardo  1. Left ventricular ejection fraction, by estimation, is 70 to 75%. The left ventricle has hyperdynamic function. The left ventricle has no regional wall motion abnormalities. There is mild left ventricular hypertrophy. Left ventricular diastolic parameters are consistent with Grade I diastolic dysfunction (impaired relaxation).  2. Right ventricular systolic function is normal. The right ventricular size is normal.  3. The mitral  valve is normal in structure. No  evidence of mitral valve regurgitation. No evidence of mitral stenosis.  4. The aortic valve is tricuspid. Aortic valve regurgitation is not visualized. Mild aortic valve sclerosis is present, with no evidence of aortic valve stenosis.  5. The inferior vena cava is normal in size with greater than 50% respiratory variability, suggesting right atrial pressure of 3 mmHg. FINDINGS  Left Ventricle: Left ventricular ejection fraction, by estimation, is 70 to 75%. The left ventricle has hyperdynamic function. The left ventricle has no regional wall motion abnormalities. The left ventricular internal cavity size was normal in size. There is mild left ventricular hypertrophy. Left ventricular diastolic parameters are consistent with Grade I diastolic dysfunction (impaired relaxation). Right Ventricle: The right ventricular size is normal. Right ventricular systolic function is normal. Left Atrium: Left atrial size was normal in size. Right Atrium: Right atrial size was normal in size. Pericardium: There is no evidence of pericardial effusion. Mitral Valve: The mitral valve is normal in structure. No evidence of mitral valve regurgitation. No evidence of mitral valve stenosis. Tricuspid Valve: The tricuspid valve is normal in structure. Tricuspid valve regurgitation is trivial. No evidence of tricuspid stenosis. Aortic Valve: The aortic valve is tricuspid. Aortic valve regurgitation is not visualized. Mild aortic valve sclerosis is present, with no evidence of aortic valve stenosis. Aortic valve mean gradient measures 10.0 mmHg. Aortic valve peak gradient measures 19.7 mmHg. Aortic valve area, by VTI measures 2.63 cm. Pulmonic Valve: The pulmonic valve was not well visualized. Pulmonic valve regurgitation is not visualized. No evidence of pulmonic stenosis. Aorta: The aortic root is normal in size and structure. Venous: The inferior vena cava is normal in size with greater than 50%  respiratory variability, suggesting right atrial pressure of 3 mmHg. IAS/Shunts: No atrial level shunt detected by color flow Doppler.  LEFT VENTRICLE PLAX 2D LVIDd:         4.00 cm     Diastology LVIDs:         2.50 cm     LV e' medial:    5.44 cm/s LV PW:         1.60 cm     LV E/e' medial:  13.8 LV IVS:        1.20 cm     LV e' lateral:   6.74 cm/s LVOT diam:     2.30 cm     LV E/e' lateral: 11.2 LV SV:         108 LV SV Index:   64 LVOT Area:     4.15 cm  LV Volumes (MOD) LV vol d, MOD A4C: 40.7 ml LV vol s, MOD A4C: 16.4 ml LV SV MOD A4C:     40.7 ml RIGHT VENTRICLE             IVC RV S prime:     11.20 cm/s  IVC diam: 2.10 cm TAPSE (M-mode): 2.4 cm LEFT ATRIUM             Index       RIGHT ATRIUM           Index LA diam:        1.70 cm 1.00 cm/m  RA Area:     14.50 cm LA Vol (A2C):   35.0 ml 20.63 ml/m RA Volume:   29.90 ml  17.63 ml/m LA Vol (A4C):   17.9 ml 10.55 ml/m LA Biplane Vol: 26.7 ml 15.74 ml/m  AORTIC VALVE  PULMONIC VALVE AV Area (Vmax):    2.60 cm     PV Vmax:       0.89 m/s AV Area (Vmean):   2.67 cm     PV Peak grad:  3.1 mmHg AV Area (VTI):     2.63 cm AV Vmax:           222.00 cm/s AV Vmean:          152.000 cm/s AV VTI:            0.412 m AV Peak Grad:      19.7 mmHg AV Mean Grad:      10.0 mmHg LVOT Vmax:         139.00 cm/s LVOT Vmean:        97.700 cm/s LVOT VTI:          0.261 m LVOT/AV VTI ratio: 0.63  AORTA Ao Root diam: 3.00 cm Ao Asc diam:  3.40 cm MITRAL VALVE MV Area (PHT): 2.12 cm     SHUNTS MV E velocity: 75.30 cm/s   Systemic VTI:  0.26 m MV A velocity: 132.00 cm/s  Systemic Diam: 2.30 cm MV E/A ratio:  0.57 Kirk Ruths MD Electronically signed by Kirk Ruths MD Signature Date/Time: 11/10/2020/12:42:08 PM    Final    VAS [place] TEMPORAL ARTERY BILATERAL  Result Date: 11/10/2020  TEMPORAL ARTERY REPORT Patient Name:  DESERE GWIN  Date of Exam:   11/10/2020 Medical Rec #: [id number]       Accession #:    [id number] Date of Birth: 26-Feb-1934         Patient Gender: F Patient Age:   89 years Exam Location:  [facility] Procedure:      VAS [place] Referring Phys: Dagoberto Ligas --------------------------------------------------------------------------------  Indications: Gradual loss of vision- right eye and headache. High Risk Factors: Age > 9 yrs and female.  Comparison Study: No prior study Performing Technologist: Maudry Mayhew MHA, RDMS, RVT, RDCS  Examination Guidelines: Patient in reclined position. 2D, color and spectral doppler sampling in the temporal artery along the hairline and temple in the longitudinal plane. 2D images along the hairline and temple in the transverse plane. Exam is bilateral.  ++-------------------+------------------+ Right Halo POSITIVELeft Halo POSITIVE ++-------------------+------------------+ +---------------+----------+-----------+----------+-----------+                Width (cm)Lenght (cm)Width (cm)Lenght (cm) +---------------+----------+-----------+----------+-----------+ Temporal Artery0.10                                       +---------------+----------+-----------+----------+-----------+ Proximal       0.10                                       +---------------+----------+-----------+----------+-----------+ Mid            0.05                                       +---------------+----------+-----------+----------+-----------+ Distal         0.04                                       +---------------+----------+-----------+----------+-----------+  Parietal branch0.02                                       +---------------+----------+-----------+----------+-----------+ Frontal branch 0.04                                       +---------------+----------+-----------+----------+-----------+ Right superficial temporal artery exhibits hyperechoic halo sign with intimal thickness measuring 0.1cm. The right superficial temporal artery is  noncompressible. Left superficial temporal artery without obvious evidence of halo sign. Left superficial temporal artery is compressible. Study was technically difficult due to vessel tortuosity/cording. Summary: Absence of a "halo" sign in the left temporal artery, although not definitive, makes a diagnosis of temporal arteritis unlikely. Presence of a "halo" sign in the right temporal artery suggests temporal arteritis.  *See table(s) above for measurements and observations.    Preliminary     Scheduled Meds:  amLODipine  10 mg Oral Daily   aspirin EC  325 mg Oral Daily   atorvastatin  40 mg Oral Daily   clopidogrel  75 mg Oral Daily   docusate sodium  100 mg Oral BID   dorzolamide  1 drop Both Eyes BID   enoxaparin (LOVENOX) injection  40 mg Subcutaneous Daily   insulin aspart  0-15 Units Subcutaneous TID WC   insulin glargine-yfgn  10 Units Subcutaneous Daily   [START ON 11/11/2020] predniSONE  60 mg Oral Q breakfast   sodium chloride flush  3 mL Intravenous Q12H   Continuous Infusions:  sodium chloride 10 mL/hr at 11/09/20 0930     LOS: 2 days   Time spent: 40 minutes. More than 50% of the time was spent in counseling/coordination of care  Lorella Nimrod, MD [facility]  If 7PM-7AM, please contact night-coverage [email or web address]  11/10/2020, 3:08 PM   This record has been created using Systems analyst. Errors have been sought and corrected,but may not always be located. Such creation errors do not reflect on the standard of care.

## 2020-11-10 NOTE — Progress Notes (Addendum)
STROKE TEAM PROGRESS NOTE   ATTENDING NOTE: I reviewed above note and agree with the assessment and plan. Pt was seen and examined.   No family at bedside, patient lying in bed, no acute event overnight.  Still has right vision loss no improvement.  Headache improved, still has mild pain behind the right eye.  Had Solu-Medrol last dose today.  Tolerating well.  Glucose on the high range, on SSI.  Temporal artery ultrasound showed Right superficial temporal artery exhibits hyperechoic halo sign with intimal thickness measuring 0.1cm. The right superficial temporal artery  is noncompressible.  Temporal artery biopsy still pending  Plan to start prednisone 60 mg daily tomorrow on discharge.  She will need to follow-up with neurology and rheumatology.  Given her history of diabetes, need close PCP follow-up for DM control in the setting of steroid use.  For the 2 punctate cerebellar infarct, likely due to severe intracranial stenosis including right VA.  Recommend aspirin 325 and Plavix 75 DAPT for 3 months and then aspirin alone.  Change pravastatin to Lipitor 40.  PT OT pending.  We will follow  [name], MD PhD Stroke Neurology 11/10/2020 9:30 PM    INTERVAL HISTORY Her family is at the bedside.  Patient laying in dark room. Denies HA or light sensitivity. Vision impaired on right eye. She is unable to see color or differentiate objects.  Vitals:   11/10/20 0807 11/10/20 1207 11/10/20 1637 11/10/20 2015  BP: (!) 159/89 (!) 155/65 (!) 153/82 (!) 161/67  Pulse: 79 70 68 86  Resp: _0 Temp: 98.3 F (36.8 C) 97.6 F (36.4 C) (!) 97.4 F (36.3 C) (!) 97.5 F (36.4 C)  TempSrc: Oral Oral Oral Oral  SpO2: 99% 96% 96% 100%  Weight:      Height:       CBC:  Recent Labs  Lab 11/07/20 2046 11/07/20 2137 11/09/20 0144  WBC 6.7  --  3.3*  NEUTROABS 4.1  --   --   HGB 10.0* 10.9* 10.0*  HCT 32.2* 32.0* 31.5*  MCV 89.7  --  86.5  PLT 516*  --  211*   Basic Metabolic  Panel:  Recent Labs  Lab 11/07/20 2046 11/07/20 2137 11/09/20 0144  NA 134* 136 133*  K 4.3 4.3 4.0  CL 99 100 99  CO2 24  --  24  GLUCOSE 205* 207* 252*  BUN _1 CREATININE 0.81 0.80 0.63  CALCIUM 9.3  --  9.0   Lipid Panel:  Recent Labs  Lab 11/09/20 0144  CHOL 165  TRIG 96  HDL 46  CHOLHDL 3.6  VLDL 19  LDLCALC 100*   HgbA1c:  Recent Labs  Lab 11/08/20 0246  HGBA1C 6.3*    IMAGING past 24 hours ECHOCARDIOGRAM COMPLETE  Result Date: 11/10/2020    ECHOCARDIOGRAM REPORT   Patient Name:   Jenna Long Date of Exam: 11/10/2020 Medical Rec #:  [id number]      Height:       67.0 in Accession #:    [id number]     Weight:       132.3 lb Date of Birth:  1934-06-08       BSA:          1.696 m Patient Age:    86 years       BP:           160/84 mmHg Patient Gender: F  HR:           78 bpm. Exam Location:  Inpatient Procedure: 2D Echo, Cardiac Doppler and Color Doppler Indications:    CVA  History:        Patient has no prior history of Echocardiogram examinations.  Sonographer:    [place] Referring Phys: [id number] [place]  1. Left ventricular ejection fraction, by estimation, is 70 to 75%. The left ventricle has hyperdynamic function. The left ventricle has no regional wall motion abnormalities. There is mild left ventricular hypertrophy. Left ventricular diastolic parameters are consistent with Grade I diastolic dysfunction (impaired relaxation).  2. Right ventricular systolic function is normal. The right ventricular size is normal.  3. The mitral valve is normal in structure. No evidence of mitral valve regurgitation. No evidence of mitral stenosis.  4. The aortic valve is tricuspid. Aortic valve regurgitation is not visualized. Mild aortic valve sclerosis is present, with no evidence of aortic valve stenosis.  5. The inferior vena cava is normal in size with greater than 50% respiratory variability, suggesting right atrial pressure of 3 mmHg. FINDINGS   Left Ventricle: Left ventricular ejection fraction, by estimation, is 70 to 75%. The left ventricle has hyperdynamic function. The left ventricle has no regional wall motion abnormalities. The left ventricular internal cavity size was normal in size. There is mild left ventricular hypertrophy. Left ventricular diastolic parameters are consistent with Grade I diastolic dysfunction (impaired relaxation). Right Ventricle: The right ventricular size is normal. Right ventricular systolic function is normal. Left Atrium: Left atrial size was normal in size. Right Atrium: Right atrial size was normal in size. Pericardium: There is no evidence of pericardial effusion. Mitral Valve: The mitral valve is normal in structure. No evidence of mitral valve regurgitation. No evidence of mitral valve stenosis. Tricuspid Valve: The tricuspid valve is normal in structure. Tricuspid valve regurgitation is trivial. No evidence of tricuspid stenosis. Aortic Valve: The aortic valve is tricuspid. Aortic valve regurgitation is not visualized. Mild aortic valve sclerosis is present, with no evidence of aortic valve stenosis. Aortic valve mean gradient measures 10.0 mmHg. Aortic valve peak gradient measures 19.7 mmHg. Aortic valve area, by VTI measures 2.63 cm. Pulmonic Valve: The pulmonic valve was not well visualized. Pulmonic valve regurgitation is not visualized. No evidence of pulmonic stenosis. Aorta: The aortic root is normal in size and structure. Venous: The inferior vena cava is normal in size with greater than 50% respiratory variability, suggesting right atrial pressure of 3 mmHg. IAS/Shunts: No atrial level shunt detected by color flow Doppler.  LEFT VENTRICLE PLAX 2D LVIDd:         4.00 cm     Diastology LVIDs:         2.50 cm     LV e' medial:    5.44 cm/s LV PW:         1.60 cm     LV E/e' medial:  13.8 LV IVS:        1.20 cm     LV e' lateral:   6.74 cm/s LVOT diam:     2.30 cm     LV E/e' lateral: 11.2 LV SV:         108 LV  SV Index:   64 LVOT Area:     4.15 cm  LV Volumes (MOD) LV vol d, MOD A4C: 40.7 ml LV vol s, MOD A4C: 16.4 ml LV SV MOD A4C:     40.7 ml RIGHT VENTRICLE  IVC RV S prime:     11.20 cm/s  IVC diam: 2.10 cm TAPSE (M-mode): 2.4 cm LEFT ATRIUM             Index       RIGHT ATRIUM           Index LA diam:        1.70 cm 1.00 cm/m  RA Area:     14.50 cm LA Vol (A2C):   35.0 ml 20.63 ml/m RA Volume:   29.90 ml  17.63 ml/m LA Vol (A4C):   17.9 ml 10.55 ml/m LA Biplane Vol: 26.7 ml 15.74 ml/m  AORTIC VALVE                    PULMONIC VALVE AV Area (Vmax):    2.60 cm     PV Vmax:       0.89 m/s AV Area (Vmean):   2.67 cm     PV Peak grad:  3.1 mmHg AV Area (VTI):     2.63 cm AV Vmax:           222.00 cm/s AV Vmean:          152.000 cm/s AV VTI:            0.412 m AV Peak Grad:      19.7 mmHg AV Mean Grad:      10.0 mmHg LVOT Vmax:         139.00 cm/s LVOT Vmean:        97.700 cm/s LVOT VTI:          0.261 m LVOT/AV VTI ratio: 0.63  AORTA Ao Root diam: 3.00 cm Ao Asc diam:  3.40 cm MITRAL VALVE MV Area (PHT): 2.12 cm     SHUNTS MV E velocity: 75.30 cm/s   Systemic VTI:  0.26 m MV A velocity: 132.00 cm/s  Systemic Diam: 2.30 cm MV E/A ratio:  0.57 Kirk Ruths MD Electronically signed by Kirk Ruths MD Signature Date/Time: 11/10/2020/12:42:08 PM    Final    VAS [place] TEMPORAL ARTERY BILATERAL  Result Date: 11/10/2020  TEMPORAL ARTERY REPORT Patient Name:  Jenna Long  Date of Exam:   11/10/2020 Medical Rec #: [id number]       Accession #:    [id number] Date of Birth: December 15, 1934        Patient Gender: F Patient Age:   18 years Exam Location:  [facility] Procedure:      VAS [place] Referring Phys: Dagoberto Ligas --------------------------------------------------------------------------------  Indications: Gradual loss of vision- right eye and headache. High Risk Factors: Age > 53 yrs and female.  Comparison Study: No prior study Performing Technologist: Maudry Mayhew  MHA, RDMS, RVT, RDCS  Examination Guidelines: Patient in reclined position. 2D, color and spectral doppler sampling in the temporal artery along the hairline and temple in the longitudinal plane. 2D images along the hairline and temple in the transverse plane. Exam is bilateral.  ++-------------------+------------------+ Right Halo POSITIVELeft Halo POSITIVE ++-------------------+------------------+ +---------------+----------+-----------+----------+-----------+                Width (cm)Lenght (cm)Width (cm)Lenght (cm) +---------------+----------+-----------+----------+-----------+ Temporal Artery0.10                                       +---------------+----------+-----------+----------+-----------+ Proximal       0.10                                       +---------------+----------+-----------+----------+-----------+  Mid            0.05                                       +---------------+----------+-----------+----------+-----------+ Distal         0.04                                       +---------------+----------+-----------+----------+-----------+ Parietal branch0.02                                       +---------------+----------+-----------+----------+-----------+ Frontal branch 0.04                                       +---------------+----------+-----------+----------+-----------+ Right superficial temporal artery exhibits hyperechoic halo sign with intimal thickness measuring 0.1cm. The right superficial temporal artery is noncompressible. Left superficial temporal artery without obvious evidence of halo sign. Left superficial temporal artery is compressible. Study was technically difficult due to vessel tortuosity/cording. Summary: Absence of a "halo" sign in the left temporal artery, although not definitive, makes a diagnosis of temporal arteritis unlikely. Presence of a "halo" sign in the right temporal artery suggests temporal arteritis.  *See  table(s) above for measurements and observations.  Electronically signed by Jamelle Haring on 11/10/2020 at 6:55:05 PM.   Final     PHYSICAL EXAM Physical Exam  Constitutional: Appears well-developed and well-nourished.  Psych: Affect appropriate to situation Eyes: Normal external eye and conjunctiva. HENT: Normocephalic, no lesions, without obvious abnormality.   Musculoskeletal-no joint tenderness, deformity or swelling Cardiovascular: Normal rate and regular rhythm.  Respiratory: Effort normal, non-labored breathing saturations WNL GI: Soft.  No distension. There is no tenderness.  Skin: WD incision over right temporal artery   Neuro:  Mental Status: Alert, oriented name/age/month/ year thought content appropriate.  Speech fluent without evidence of aphasia.  Able to follow  commands without difficulty. Cranial Nerves: II: VFF in left eye only, right eye with vision impairment. Unable to differentiate objects. She can tell something is moving, but can not  identify color or object.  III,IV, VI: ptosis not present, extra-ocular motions intact bilaterally pupils equal, round, reactive to light and accommodation V,VII: smile symmetric, facial light touch sensation normal bilaterally VIII: hearing normal bilaterally IX,X: uvula rises symmetrically XI: bilateral shoulder shrug XII: midline tongue extension Motor: Able to raise all extremities anti gravity. Tone and bulk:normal tone throughout; no atrophy noted Sensory: light touch intact throughout, bilaterally Cerebellar: No ataxia noted Gait: deferred     ASSESSMENT/PLAN Jenna Long is a 86 y.o. female with a medical history significant for type 2 diabetes mellitus, essential hypertension, and hypercholesteremia who presented to the ED for evaluation of right eye vision loss. Patient states that about 1.5 months ago, she began to notice inner ear and sinus problems but that her inner ear problems went away and her sinus  congestion on her left face began to feel better. She noted that the sinus congestion recurred approximately 2 weeks ago with associated temporal pulsatile discomfort and visual disturbance described as a film over her right eye with right eye stinging and irritation over the past  5 days. Due to progressive loss of vision in the right eye to complete vision loss, she saw her optometrist yesterday afternoon. After evaluation, he recommended that she be seen by Dr. Katy Fitch for evaluation but she was unable to make the appointment on time and she instead came to the ED for further evaluation. Patient also notes that she has had poor PO intake and she has not been able to hold food down at home. She also states that she tried eating her lunch while in the hospital but vomited it up immediately. Work up in the ED revealed a CRP of 8.6 with a sed rate of 111. Imaging revealed two incidental acute right cerebellar infarctions, signal abnormality within the right vertebral artery suggestive of high-grade stenosis or vessel occlusion, and abnormal enhancement along the optic nerve sheaths compatible with bilateral optic nerve perineuritis and neurology was consulted for further evaluation and management  Temporal arteritis Clinical symptoms of headache and vision loss, but denies jaw claudication, shoulder pain, fever ESR 111, CRP 8.6 Temporal artery biopsy done, result pending On Solu-Medrol 1 g daily x3 days, followed by 60 mg prednisone daily On PPI Glucose monitoring in p.m. patient  Stroke:  right cerebellar infarct secondary to severe right vertebral artery stenosis.  CT head . Small inferior right cerebellar infarcts better seen on same-day MRI. 2. No evidence of interval acute abnormality. CTA head & neck 1. Severe stenosis of the mid and distal basilar artery and bilateral P2 PCAs.2. Severe stenosis of the distal M1 and proximal M2 MCA branches bilaterally. 3. Severe stenosis of the left A2 ACA. 4.  Multifocal severe stenosis of the right vertebral artery in the neck with occlusion at the C2-C3 level and irregular opacification of a small V3/V4 vertebral artery from collaterals. 5. Moderate stenosis of the intradural left vertebral artery. 6. Bilateral carotid bifurcation atherosclerosis without greater than 50% stenosis. MRI  Two subcentimeter acute infarcts within the inferior right cerebellar hemisphere., Chronic lacunar infarcts within the left cerebral hemispheric white matter, bilateral basal ganglia, and bilateral thalami. 2D Echo pending VAS [place] temporal artery Bilateral: pending LDL 100 HgbA1c 6.3 VTE prophylaxis - lovenox No antithrombotic prior to admission, now on aspirin 81 mg daily and clopidogrel 75 mg daily.  Continue ASA and Plavix for 3 weeks and then ASA alone.  Therapy recommendations:  pending Disposition:  pending  Hypertension Home meds:  amlodipine Stable Permissive hypertension (OK if < 220/120) but gradually normalize in 5-7 days Long-term BP goal normotensive  Hyperlipidemia Home meds:  pravastatin 40 mg daily ,  LDL 100, goal < 70 Consider changing to High intensity statin like Atorvastatin Continue statin at discharge  Diabetes type II Controlled Home meds:  januvia, metformin HgbA1c 6.3, goal < 7.0 CBGs Recent Labs    11/10/20 1209 11/10/20 1644 11/10/20 2128  GLUCAP 209* 282* 209*    SSI  Other Stroke Risk Factors Advanced Age >/= 54   Other Active Problems Temporal arteritis with right eye vision impairment>>> temporal artery biopsy 11/09/20 results pending.    Hospital day # 2  Laurey Morale, MSN, NP-C [facility] [phone]   To contact Stroke Continuity provider, please refer to [email or web address] After hours, contact General Neurology

## 2020-11-11 DIAGNOSIS — I10 Essential (primary) hypertension: Secondary | ICD-10-CM | POA: Diagnosis not present

## 2020-11-11 DIAGNOSIS — E119 Type 2 diabetes mellitus without complications: Secondary | ICD-10-CM | POA: Diagnosis not present

## 2020-11-11 DIAGNOSIS — M316 Other giant cell arteritis: Secondary | ICD-10-CM | POA: Diagnosis not present

## 2020-11-11 DIAGNOSIS — I6330 Cerebral infarction due to thrombosis of unspecified cerebral artery: Secondary | ICD-10-CM | POA: Diagnosis not present

## 2020-11-11 DIAGNOSIS — H5461 Unqualified visual loss, right eye, normal vision left eye: Secondary | ICD-10-CM | POA: Diagnosis not present

## 2020-11-11 LAB — GLUCOSE, CAPILLARY
Glucose-Capillary: 252 mg/dL — ABNORMAL HIGH (ref 70–99)
Glucose-Capillary: 254 mg/dL — ABNORMAL HIGH (ref 70–99)
Glucose-Capillary: 188 mg/dL — ABNORMAL HIGH (ref 70–99)
Glucose-Capillary: 216 mg/dL — ABNORMAL HIGH (ref 70–99)

## 2020-11-11 MED ORDER — INSULIN GLARGINE-YFGN 100 UNIT/ML ~~LOC~~ SOLN
10.0000 [IU] | Freq: Two times a day (BID) | SUBCUTANEOUS | Status: DC
  Administered 2020-11-11 – 2020-11-17 (×12): 10 [IU] via SUBCUTANEOUS
  Filled 2020-11-11 (×13): qty 0.1

## 2020-11-11 MED ORDER — LOSARTAN POTASSIUM 25 MG PO TABS
25.0000 mg | ORAL_TABLET | Freq: Every day | ORAL | Status: DC
  Administered 2020-11-11 – 2020-11-18 (×8): 25 mg via ORAL
  Filled 2020-11-11 (×8): qty 1

## 2020-11-11 NOTE — Progress Notes (Signed)
STROKE TEAM PROGRESS NOTE   INTERVAL HISTORY No family at bedside.  Patient lying in bed, no acute distress.  Still has right eye vision total loss.  Per Dr. Reesa Chew, patient had presyncope episode while washing her self in the sink earlier today.  Now back to bed, at baseline.  Solu-Medrol 3-day course completed yesterday, will start prednisone 60 mg daily today.  Still has hyperglycemia.  Vitals:   11/10/20 2015 11/10/20 2330 11/11/20 0357 11/11/20 0834  BP: (!) 161/67 (!) 148/82 (!) 186/73 (!) 188/94  Pulse: 86 72 81 74  Resp: _0 Temp: (!) 97.5 F (36.4 C) 97.8 F (36.6 C) 97.8 F (36.6 C) (!) 97.3 F (36.3 C)  TempSrc: Oral Oral Oral Oral  SpO2: 100% 98% 98% 98%  Weight:      Height:       CBC:  Recent Labs  Lab 11/07/20 2046 11/07/20 2137 11/09/20 0144  WBC 6.7  --  3.3*  NEUTROABS 4.1  --   --   HGB 10.0* 10.9* 10.0*  HCT 32.2* 32.0* 31.5*  MCV 89.7  --  86.5  PLT 516*  --  161*   Basic Metabolic Panel:  Recent Labs  Lab 11/07/20 2046 11/07/20 2137 11/09/20 0144  NA 134* 136 133*  K 4.3 4.3 4.0  CL 99 100 99  CO2 24  --  24  GLUCOSE 205* 207* 252*  BUN _1 CREATININE 0.81 0.80 0.63  CALCIUM 9.3  --  9.0   Lipid Panel:  Recent Labs  Lab 11/09/20 0144  CHOL 165  TRIG 96  HDL 46  CHOLHDL 3.6  VLDL 19  LDLCALC 100*   HgbA1c:  Recent Labs  Lab 11/08/20 0246  HGBA1C 6.3*    IMAGING past 24 hours VAS [place] TEMPORAL ARTERY BILATERAL  Result Date: 11/10/2020  TEMPORAL ARTERY REPORT Patient Name:  Jenna Long  Date of Exam:   11/10/2020 Medical Rec #: [id number]       Accession #:    [id number] Date of Birth: 21-Dec-1934        Patient Gender: F Patient Age:   49 years Exam Location:  [facility] Procedure:      VAS [place] Referring Phys: Dagoberto Ligas --------------------------------------------------------------------------------  Indications: Gradual loss of vision- right eye and headache. High Risk Factors:  Age > 73 yrs and female.  Comparison Study: No prior study Performing Technologist: Maudry Mayhew MHA, RDMS, RVT, RDCS  Examination Guidelines: Patient in reclined position. 2D, color and spectral doppler sampling in the temporal artery along the hairline and temple in the longitudinal plane. 2D images along the hairline and temple in the transverse plane. Exam is bilateral.  ++-------------------+------------------+ Right Halo POSITIVELeft Halo POSITIVE ++-------------------+------------------+ +---------------+----------+-----------+----------+-----------+                Width (cm)Lenght (cm)Width (cm)Lenght (cm) +---------------+----------+-----------+----------+-----------+ Temporal Artery0.10                                       +---------------+----------+-----------+----------+-----------+ Proximal       0.10                                       +---------------+----------+-----------+----------+-----------+ Mid            0.05                                       +---------------+----------+-----------+----------+-----------+  Distal         0.04                                       +---------------+----------+-----------+----------+-----------+ Parietal branch0.02                                       +---------------+----------+-----------+----------+-----------+ Frontal branch 0.04                                       +---------------+----------+-----------+----------+-----------+ Right superficial temporal artery exhibits hyperechoic halo sign with intimal thickness measuring 0.1cm. The right superficial temporal artery is noncompressible. Left superficial temporal artery without obvious evidence of halo sign. Left superficial temporal artery is compressible. Study was technically difficult due to vessel tortuosity/cording. Summary: Absence of a "halo" sign in the left temporal artery, although not definitive, makes a diagnosis of temporal arteritis  unlikely. Presence of a "halo" sign in the right temporal artery suggests temporal arteritis.  *See table(s) above for measurements and observations.  Electronically signed by Jamelle Haring on 11/10/2020 at 6:55:05 PM.   Final     PHYSICAL EXAM  Temp:  [97.3 F (36.3 C)-97.8 F (36.6 C)] 97.3 F (36.3 C) (09/23 0834) Pulse Rate:  [68-86] 74 (09/23 0834) Resp:  [11-18] 16 (09/23 0834) BP: (148-188)/(65-94) 188/94 (09/23 0834) SpO2:  [96 %-100 %] 98 % (09/23 0834)  General - Well nourished, well developed, in no apparent distress.  Ophthalmologic - fundi not visualized due to noncooperation.  Cardiovascular - Regular rhythm and rate.  Neuro - patient awake alert fully orientated, however, right eye complete vision loss.  No aphasia, fluent language, follows simple commands, able to name and repeat.  Left eye no visual field deficit, no gaze palsy, facial symmetrical, tongue midline.  Bilateral upper and lower extremity equal strengths, sensation symmetrical, finger-to-nose intact.  However left heel-to-shin dysmetria.     ASSESSMENT/PLAN Jenna Long is a 86 y.o. female with history of diabetes, hypertension, hyperlipidemia admitted for headache, vision loss, lethargy.  Per daughter and the patient, 2-3 weeks ago patient had inner ear infection and headache.  1 week ago inner ear infection but still has continued headache started to feel right eye blurry vision.  5 days ago, patient went to see her PCP and found to have right complete vision loss.  Also per daughter, for the last weeks, patient lethargic, not eating well, sleeping all day, not moving around, sometimes confused.  Patient denies any jaw pain, fever, diplopia or shoulder pain.  Temporal arteritis Clinical symptoms of headache and vision loss, but denies jaw claudication, shoulder pain, fever Persistent right vision loss ESR 111, CRP 8.6 Temporal artery biopsy done, result pending Temporal artery ultrasound showed Right  superficial temporal artery exhibits hyperechoic halo sign with intimal thickness measuring 0.1cm. The right superficial temporal artery is noncompressible.  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  CT head . Small inferior right cerebellar infarcts better seen on same-day MRI. 2. No evidence of  interval acute abnormality. CTA head & neck 1. Severe stenosis of the mid and distal basilar artery and bilateral P2 PCAs.2. Severe stenosis of the distal M1 and proximal M2 MCA branches bilaterally. 3. Severe stenosis of the left A2 ACA. 4. Multifocal severe stenosis of the right vertebral artery in the neck with occlusion at the C2-C3 level and irregular opacification of a small V3/V4 vertebral artery from collaterals. 5. Moderate stenosis of the intradural left vertebral artery. 6. Bilateral carotid bifurcation atherosclerosis without greater than 50% stenosis. MRI  Two subcentimeter acute infarcts within the inferior right cerebellar hemisphere., Chronic lacunar infarcts within the left cerebral hemispheric white matter, bilateral basal ganglia, and bilateral thalami. 2D Echo EF 70 to 75% LDL 100 HgbA1c 6.3 VTE prophylaxis - lovenox No antithrombotic prior to admission, now on aspirin 81 mg daily and clopidogrel 75 mg daily.  Continue ASA and Plavix for 3 weeks and then ASA alone.  Therapy recommendations: CIR but pt refused will do Home health PT/OT Disposition: Home  Presyncope 9/23 in the morning while washing herself at the sink Likely orthostatic hypotension Recommend IV fluid TED hose Encourage p.o. intake Repeat orthostatic vitals in p.m.  Hypertension Home meds:  amlodipine Stable Long-term BP goal  normotensive  Hyperlipidemia Home meds:  pravastatin 40 mg daily LDL 100, goal < 70 Changed to Lipitor 40 Continue statin at discharge  Diabetes type II Controlled Home meds:  januvia, metformin HgbA1c 6.3, goal < 7.0 CBGs Hyperglycemia due to steroid use SSI Close PT follow-up for better DM control in the setting of steroid use  Other Stroke Risk Factors Advanced Age >/= 6   Other Active Problems   Hospital day # 3  Neurology will sign off. Please call with questions. Pt will follow up with stroke clinic Dr. Leonie Man at [facility] in about 4 weeks. Thanks for the consult.  [name], MD PhD Stroke Neurology 11/11/2020 11:28 AM    To contact Stroke Continuity provider, please refer to [email or web address] After hours, contact General Neurology

## 2020-11-11 NOTE — Progress Notes (Signed)
Physical Therapy Treatment Patient Details Name: Jenna Long MRN: [id number] DOB: [birth year] Today's Date: 11/11/2020   History of Present Illness 86 y/o female presented to ED on 9/20 for R vision loss. Was being sent to opthamologist, however office was closed prior to arrival and decided to come to ED. 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 MRI orbits revelaed abnormal enhancement along optic nerve sheaths bilaterally, compatible with bilateral optic nerve perineuritis. 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 PMH: HTN, HLD, DM    PT Comments    Pt was seen for mobility on side of bed with help and encouragement from her sister.  Pt is going to be challenged to go directly home given her dizziness and desaturations with mobility.  Pt was checked for orthostatics (see below) but is stable there, mainly losing O2 sat control in bed and with standing effort.  Her plan is to reconsider the discharge plan for family asking to take her home.   Follow up with strengthening, standing balance and resumption of gait when safely able.  Encourage OOB and to increase her consumption of food and liquid as able.   Recommendations for follow up therapy are one component of a multi-disciplinary discharge planning process, led by the attending physician.  Recommendations may be updated based on patient status, additional functional criteria and insurance authorization.  Follow Up Recommendations  CIR     Equipment Recommendations  Rolling walker with 5" wheels    Recommendations for Other Services Rehab consult     Precautions / Restrictions Precautions Precautions: Fall;Other (comment) Precaution Comments: monitor for syncopal symptoms Restrictions Weight Bearing Restrictions: No     Mobility  Bed Mobility Overal bed mobility: Needs Assistance Bed Mobility: Supine to Sit;Sit to  Supine     Supine to sit: Min assist Sit to supine: Mod assist   General bed mobility comments: mod assist with vc's for sequence to return to bed    Transfers Overall transfer level: Needs assistance Equipment used: 1 person hand held assist;Rolling walker (2 wheeled) (CNA standing by) Transfers: Sit to/from Stand Sit to Stand: Min assist         General transfer comment: min assist with cues for hand placement and sequence  Ambulation/Gait             General Gait Details: pt deferred due to light headed feelings   Stairs             Wheelchair Mobility    Modified Rankin (Stroke Patients Only)       Balance Overall balance assessment: Needs assistance;History of Falls Sitting-balance support: Feet supported Sitting balance-Leahy Scale: Fair     Standing balance support: Bilateral upper extremity supported;During functional activity Standing balance-Leahy Scale: Poor Standing balance comment: requires walker and holding pt due to recent near syncopal episode                            Cognition Arousal/Alertness: Awake/alert Behavior During Therapy: WFL for tasks assessed/performed Overall Cognitive Status:  (family present but not commenting on pt) Area of Impairment: Attention;Safety/judgement;Awareness;Problem solving                   Current Attention Level: Selective   Following Commands: Follows one step commands inconsistently;Follows one step commands with increased time Safety/Judgement: Decreased awareness of safety;Decreased awareness of deficits Awareness: Intellectual Problem  Solving: Slow processing;Requires verbal cues;Requires tactile cues General Comments: pt is clearly limited with standing tolerance and unabel to get to chair this AM, but asking when she is leaving for home      Exercises      General Comments General comments (skin integrity, edema, etc.): BP was taken supine at 149/90; sitting  152/94;  standing and quick sit 130/95;  standing 123/87.  Ox sats dropped sporadically to 86%      Pertinent Vitals/Pain Pain Assessment: No/denies pain    Home Living                      Prior Function            PT Goals (current goals can now be found in the care plan section) Acute Rehab PT Goals Patient Stated Goal: to go home    Frequency    Min 4X/week      PT Plan Current plan remains appropriate    Co-evaluation              AM-PAC PT "6 Clicks" Mobility   Outcome Measure  Help needed turning from your back to your side while in a flat bed without using bedrails?: A Little Help needed moving from lying on your back to sitting on the side of a flat bed without using bedrails?: A Little Help needed moving to and from a bed to a chair (including a wheelchair)?: A Little Help needed standing up from a chair using your arms (e.g., wheelchair or bedside chair)?: A Little Help needed to walk in hospital room?: A Little Help needed climbing 3-5 steps with a railing? : A Lot 6 Click Score: 17    End of Session Equipment Utilized During Treatment: Gait belt Activity Tolerance: Patient tolerated treatment well Patient left: in bed;with call bell/phone within reach;with nursing/sitter in room Nurse Communication: Mobility status PT Visit Diagnosis: Unsteadiness on feet (R26.81);Muscle weakness (generalized) (M62.81);Difficulty in walking, not elsewhere classified (R26.2)     Time: 7034-0352 PT Time Calculation (min) (ACUTE ONLY): 29 min  Charges:  $Therapeutic Activity: 23-37 mins                 Ramond Dial 11/11/2020, 3:03 PM  Mee Hives, PT MS Acute Rehab Dept. Number: [place] [facility] [place]

## 2020-11-11 NOTE — TOC Initial Note (Signed)
Transition of Care (TOC) - Initial/Assessment Note  Marvetta Gibbons RN, BSN Transitions of Care Unit 4E- RN Case Manager See Treatment Team for direct phone #    Patient Details  Name: Jenna Long MRN: [id number] Date of Birth: 09-08-1934  Transition [facility]) CM/SW Contact:    Dawayne Patricia, RN Phone Number: 11/11/2020, 1:32 PM  Clinical Narrative:                 Orders placed for HHRN/PT/OT/aide, CM went to speak with pt and daughter at the bedside. On discussion regarding transition needs pt and daughter asking about INPT option. Per PT/OT recommendations are for CIR however pt initially was declining CIR and wanted to return home. At this time pt has reconsidered after near syncope this am and states she feels like INPT rehab would be a good idea. Per daughter pt lives alone but family would be able to assist post discharge. List provided to daughter for [facility] choice after rehab stay should pt still need [facility] services.   Msg sent to MD for INPT rehab consult order request-  Verbal order received and CIR consult has been placed.   Have explained to pt and daughter that INPT rehab is based on CIR consult to determine if she is a good candidate for program as well as submitting for insurance approval and bed availability. Pt and daughter voiced understanding.   Expected Discharge Plan: IP Rehab Facility Barriers to Discharge: Insurance Authorization   Patient Goals and CMS Choice Patient states their goals for this hospitalization and ongoing recovery are:: INPT rehab then home with [facility] CMS Medicare.gov Compare Post Acute Care list provided to:: Patient Choice offered to / list presented to : Patient, Adult Children  Expected Discharge Plan and Services Expected Discharge Plan: [place]   Discharge Planning Services: CM Consult Post Acute Care Choice: Home Health, IP Rehab Living arrangements for the past 2 months: Single Family Home                            HH Arranged: RN, PT, OT, Nurse's Aide          Prior Living Arrangements/Services Living arrangements for the past 2 months: Single Family Home Lives with:: Self Patient language and need for interpreter reviewed:: Yes Do you feel safe going back to the place where you live?: Yes      Need for Family Participation in Patient Care: Yes (Comment) Care giver support system in place?: Yes (comment) Current home services: DME Criminal Activity/Legal Involvement Pertinent to Current Situation/Hospitalization: No - Comment as needed  Activities of Daily Living Home Assistive Devices/Equipment: Eyeglasses, Cane (specify quad or straight), Other (Comment), CBG Meter (gripper to get clothes on) ADL Screening (condition at time of admission) Patient's cognitive ability adequate to safely complete daily activities?: Yes Is the patient deaf or have difficulty hearing?: Yes Does the patient have difficulty seeing, even when wearing glasses/contacts?: No Does the patient have difficulty concentrating, remembering, or making decisions?: No Patient able to express need for assistance with ADLs?: Yes Does the patient have difficulty dressing or bathing?: No Independently performs ADLs?: Yes (appropriate for developmental age) Does the patient have difficulty walking or climbing stairs?: No Weakness of Legs: None Weakness of Arms/Hands: None  Permission Sought/Granted Permission sought to share information with : Facility Art therapist granted to share information with : Yes, Verbal Permission Granted     Permission granted  to share info w AGENCY: CIR        Emotional Assessment Appearance:: Appears stated age Attitude/Demeanor/Rapport: Engaged Affect (typically observed): Appropriate, Accepting, Pleasant Orientation: : Oriented to Self, Oriented to Place, Oriented to  Time, Oriented to Situation Alcohol / Substance Use: Not Applicable Psych Involvement: No  (comment)  Admission diagnosis:  Temporal arteritis ([place]) [M31.6] Vision loss of right eye [H54.61] Patient Active Problem List   Diagnosis Date Noted   Cerebral thrombosis with cerebral infarction 11/09/2020   Temporal arteritis ([place]) 11/08/2020   Diabetes mellitus type 2 in nonobese ([place]) 11/08/2020   DNR (do not resuscitate) 11/08/2020   Left displaced femoral neck fracture ([place]) 02/18/2020   Essential hypertension 02/18/2020   Mixed hyperlipidemia 02/18/2020   Fall    PCP:  Sharilyn Sites, MD Pharmacy:   [place], [place] [address] [place] [address] Phone: [phone] Fax: [phone]     Social Determinants of Health (SDOH) Interventions    Readmission Risk Interventions No flowsheet data found.

## 2020-11-11 NOTE — Progress Notes (Signed)
PROGRESS NOTE    Jenna Long  FMB:[id number] DOB: 01/02/1935 DOA: 11/07/2020 PCP: Sharilyn Sites, MD   Brief Narrative: Taken from H&P. Jenna Long is a 86 y.o. female with medical history significant of HTN; HLD; and DM presenting with R eye vision loss. She has had inner ear and sinus problems.  The inner ear went away but the sinus stopped her head up.  When it started again, she noticed a film over her R eye.  She called Dr. Rosana Hoes and made an appointment for Monday at 3:15.  She was concerned that she had lost her eyesight.  Dr. Rosana Hoes understood what was going on and called Dr. Katy Fitch for an appointment at 5pm  They were late for the appointment and so they came to the ER.  It has not changed in the ER.  The film has gone but the vision is completely gone -0 can not even see light.  No headache but she has had temporal discomfort for about 2 weeks since her sinuses got stopped up.  She gets pulsating pains in her R temporal region and goes to lie down until they improve. Elevated inflammatory markers.  COVID-19 negative.  MRI with new small right cerebellar infarct. 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  Concern of giant cell arteritis-patient underwent right temporal artery biopsy on 11/09/2020.  She was also started on high-dose steroid for 3 days-completed a course today. Temporal artery duplex with Halo sign on right which is consistent with temporal arteritis.  No sign on left.  Still no vision on right-can be a permanent vision loss as symptoms started approximately 2 weeks ago.  She will be started on prednisone at 1 mg/kg/day from tomorrow and will need a very slow taper, like 50 mg after 2 weeks, decreasing 10 mg every 2 weeks.  Patient will also need outpatient neurology and rheumatology appointments for further management.  Subjective: Patient continued to have visual loss on the right.  She had a presyncopal episode while working with OT when she stood up at sink  for cleaning, associated with lightheadedness, nausea and vomiting.  Denies any chest pain. Initially wants to go home and then after talking with her daughter agrees to go to CIR.  Assessment & Plan:   Principal Problem:   Temporal arteritis ([place]) Active Problems:   Essential hypertension   Mixed hyperlipidemia   Diabetes mellitus type 2 in nonobese (HCC)   DNR (do not resuscitate)   Cerebral thrombosis with cerebral infarction  Right vision loss.  Patient also had subacute temporal pain.  Recent history of sinus congestion.  Elevated CRP.  Concern of giant cell arteritis/temporal arteritis. Vascular surgery was consulted and patient underwent successful temporal artery biopsy on 9/21, pending pathology results. 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  2 small acute infarcts involving the inferior right cerebellar hemisphere.  Chronic lacunar infarcts. Arterial duplex of temporal arteries positive for halo sign on right which is consistent with temporal arteritis.  Completed a 3-day course of high-dose steroid. -Start her on 60 mg of prednisone daily -she will need a very slow taper over the course of many weeks in the future.  Decreased 10 mg every 2-week.  She will also need a close follow-up by PCP and vascular surgery.  She will also need follow-up with rheumatology and neurology. Most likely a permanent right sided vision loss. -Follow-up temporal artery biopsy results. -Continue Trusopt in B eyes for now (glaucoma)  Presyncope.  Most likely vasovagal  as patient was working with OT and trying to do some cleanup at the sink. -Check orthostatic vitals.  Acute small inferior right cerebellar infarcts.  CTA with moderate to severe stenosis involving multiple vessels which include basilar, bilateral P2 PCAs.  Severe stenosis of M1 and M2 of MCA bilaterally.  Severe stenosis of left ACA.  Severe stenosis of right vertebral artery and moderate stenosis of left vertebral  artery.  Moderate stenosis of carotid bifurcation.   Lipid panel with LDL of 100, goal of less than 70. -Continue with statin  Type 2 diabetes mellitus.  On Glucophage and Januvia at home.  Well-controlled with A1c of 6.3. CBC elevated as she is on steroid. -Increase long-acting to 10 units twice daily. -Continue with SSI  Hypertension.  Blood pressure remained elevated despite increasing the dose of amlodipine. -Continue amlodipine to 10 mg daily -Add low-dose losartan and monitor.  Objective: Vitals:   11/11/20 0357 11/11/20 0834 11/11/20 1150 11/11/20 1621  BP: (!) 186/73 (!) 188/94 (!) 149/90 (!) 149/77  Pulse: 81 74 83 81  Resp: 16 16 16 16   Temp: 97.8 F (36.6 C) (!) 97.3 F (36.3 C) (!) 97.5 F (36.4 C) 98.1 F (36.7 C)  TempSrc: Oral Oral Oral Oral  SpO2: 98% 98% 95% 94%  Weight:      Height:       No intake or output data in the 24 hours ending 11/11/20 1629  Filed Weights   11/08/20 2033 11/08/20 2304 11/09/20 0928  Weight: 68.5 kg 60 kg 60 kg    Examination:  General.  Ill-appearing elderly lady, in no acute distress. Pulmonary.  Lungs clear bilaterally, normal respiratory effort. CV.  Regular rate and rhythm, no JVD, rub or murmur. Abdomen.  Soft, nontender, nondistended, BS positive. CNS.  Alert and oriented .  No focal neurologic deficit. Extremities.  No edema, no cyanosis, pulses intact and symmetrical. Psychiatry.  Judgment and insight appears normal.   DVT prophylaxis: Lovenox Code Status: DNR Family Communication: Discussed with daughter at bedside Disposition Plan:  Status is: Inpatient  Remains inpatient appropriate because:Inpatient level of care appropriate due to severity of illness  Dispo: The patient is from: Home              Anticipated d/c is to: Home              Patient currently is not medically stable to d/c.   Difficult to place patient No              Level of care: Telemetry Medical  All the records are reviewed and case  discussed with Care Management/Social Worker. Management plans discussed with the patient, nursing and they are in agreement.  Consultants:  Neurology Vascular surgery  Procedures:  Right temporal artery biopsy  Antimicrobials:   Data Reviewed: I have personally reviewed following labs and imaging studies  CBC: Recent Labs  Lab 11/07/20 2046 11/07/20 2137 11/09/20 0144  WBC 6.7  --  3.3*  NEUTROABS 4.1  --   --   HGB 10.0* 10.9* 10.0*  HCT 32.2* 32.0* 31.5*  MCV 89.7  --  86.5  PLT 516*  --  457*    Basic Metabolic Panel: Recent Labs  Lab 11/07/20 2046 11/07/20 2137 11/09/20 0144  NA 134* 136 133*  K 4.3 4.3 4.0  CL 99 100 99  CO2 24  --  24  GLUCOSE 205* 207* 252*  BUN 18 19 13   CREATININE 0.81 0.80 0.63  CALCIUM  9.3  --  9.0    GFR: Estimated Creatinine Clearance: 49.6 mL/min (by C-G formula based on SCr of 0.63 mg/dL). Liver Function Tests: Recent Labs  Lab 11/07/20 2046  AST 13*  ALT 13  ALKPHOS 81  BILITOT 0.5  PROT 8.0  ALBUMIN 2.9*    No results for input(s): LIPASE, AMYLASE in the last 168 hours. No results for input(s): AMMONIA in the last 168 hours. Coagulation Profile: Recent Labs  Lab 11/07/20 2046  INR 1.1    Cardiac Enzymes: No results for input(s): CKTOTAL, CKMB, CKMBINDEX, TROPONINI in the last 168 hours. BNP (last 3 results) No results for input(s): PROBNP in the last 8760 hours. HbA1C: No results for input(s): HGBA1C in the last 72 hours.  CBG: Recent Labs  Lab 11/10/20 1644 11/10/20 2128 11/11/20 0628 11/11/20 1146 11/11/20 1627  GLUCAP 282* 209* 252* 254* 188*    Lipid Profile: Recent Labs    11/09/20 0144  CHOL 165  HDL 46  LDLCALC 100*  TRIG 96  CHOLHDL 3.6    Thyroid Function Tests: No results for input(s): TSH, T4TOTAL, FREET4, T3FREE, THYROIDAB in the last 72 hours. Anemia Panel: No results for input(s): VITAMINB12, FOLATE, FERRITIN, TIBC, IRON, RETICCTPCT in the last 72 hours. Sepsis  Labs: No results for input(s): PROCALCITON, LATICACIDVEN in the last 168 hours.  Recent Results (from the past 240 hour(s))  SARS CORONAVIRUS 2 ([name] 6-24 HRS) Nasopharyngeal Nasopharyngeal Swab     Status: None   Collection Time: 11/08/20 10:18 AM   Specimen: Nasopharyngeal Swab  Result Value Ref Range Status   SARS Coronavirus 2 NEGATIVE NEGATIVE Final    Comment: (NOTE) SARS-CoV-2 target nucleic acids are NOT DETECTED.  The SARS-CoV-2 RNA is generally detectable in upper and lower respiratory specimens during the acute phase of infection. Negative results do not preclude SARS-CoV-2 infection, do not rule out co-infections with other pathogens, and should not be used as the sole basis for treatment or other patient management decisions. Negative results must be combined with clinical observations, patient history, and epidemiological information. The expected result is Negative.  Fact Sheet for Patients: SugarRoll.be  Fact Sheet for Healthcare Providers: https://www.woods-mathews.com/  This test is not yet approved or cleared by the [place] FDA and  has been authorized for detection and/or diagnosis of SARS-CoV-2 by FDA under an Emergency Use Authorization (EUA). This EUA will remain  in effect (meaning this test can be used) for the duration of the COVID-19 declaration under Se ction 564(b)(1) of the Act, 21 U.S.C. section 360bbb-3(b)(1), unless the authorization is terminated or revoked sooner.  Performed at [facility], [place] [address], [place], [place] [address]       Radiology Studies: ECHOCARDIOGRAM COMPLETE  Result Date: 11/10/2020    ECHOCARDIOGRAM REPORT   Patient Name:   MAKAYLIE DEDEAUX Date of Exam: 11/10/2020 Medical Rec #:  [id number]      Height:       67.0 in Accession #:    [id number]     Weight:       132.3 lb Date of Birth:  August 15, 1934       BSA:          1.696 m Patient Age:    89 years       BP:            160/84 mmHg Patient Gender: F              HR:  78 bpm. Exam Location:  Inpatient Procedure: 2D Echo, Cardiac Doppler and Color Doppler Indications:    CVA  History:        Patient has no prior history of Echocardiogram examinations.  Sonographer:    Guinda Referring Phys: [id number] [place]  1. Left ventricular ejection fraction, by estimation, is 70 to 75%. The left ventricle has hyperdynamic function. The left ventricle has no regional wall motion abnormalities. There is mild left ventricular hypertrophy. Left ventricular diastolic parameters are consistent with Grade I diastolic dysfunction (impaired relaxation).  2. Right ventricular systolic function is normal. The right ventricular size is normal.  3. The mitral valve is normal in structure. No evidence of mitral valve regurgitation. No evidence of mitral stenosis.  4. The aortic valve is tricuspid. Aortic valve regurgitation is not visualized. Mild aortic valve sclerosis is present, with no evidence of aortic valve stenosis.  5. The inferior vena cava is normal in size with greater than 50% respiratory variability, suggesting right atrial pressure of 3 mmHg. FINDINGS  Left Ventricle: Left ventricular ejection fraction, by estimation, is 70 to 75%. The left ventricle has hyperdynamic function. The left ventricle has no regional wall motion abnormalities. The left ventricular internal cavity size was normal in size. There is mild left ventricular hypertrophy. Left ventricular diastolic parameters are consistent with Grade I diastolic dysfunction (impaired relaxation). Right Ventricle: The right ventricular size is normal. Right ventricular systolic function is normal. Left Atrium: Left atrial size was normal in size. Right Atrium: Right atrial size was normal in size. Pericardium: There is no evidence of pericardial effusion. Mitral Valve: The mitral valve is normal in structure. No evidence of mitral valve regurgitation. No evidence of  mitral valve stenosis. Tricuspid Valve: The tricuspid valve is normal in structure. Tricuspid valve regurgitation is trivial. No evidence of tricuspid stenosis. Aortic Valve: The aortic valve is tricuspid. Aortic valve regurgitation is not visualized. Mild aortic valve sclerosis is present, with no evidence of aortic valve stenosis. Aortic valve mean gradient measures 10.0 mmHg. Aortic valve peak gradient measures 19.7 mmHg. Aortic valve area, by VTI measures 2.63 cm. Pulmonic Valve: The pulmonic valve was not well visualized. Pulmonic valve regurgitation is not visualized. No evidence of pulmonic stenosis. Aorta: The aortic root is normal in size and structure. Venous: The inferior vena cava is normal in size with greater than 50% respiratory variability, suggesting right atrial pressure of 3 mmHg. IAS/Shunts: No atrial level shunt detected by color flow Doppler.  LEFT VENTRICLE PLAX 2D LVIDd:         4.00 cm     Diastology LVIDs:         2.50 cm     LV e' medial:    5.44 cm/s LV PW:         1.60 cm     LV E/e' medial:  13.8 LV IVS:        1.20 cm     LV e' lateral:   6.74 cm/s LVOT diam:     2.30 cm     LV E/e' lateral: 11.2 LV SV:         108 LV SV Index:   64 LVOT Area:     4.15 cm  LV Volumes (MOD) LV vol d, MOD A4C: 40.7 ml LV vol s, MOD A4C: 16.4 ml LV SV MOD A4C:     40.7 ml RIGHT VENTRICLE             IVC RV S  prime:     11.20 cm/s  IVC diam: 2.10 cm TAPSE (M-mode): 2.4 cm LEFT ATRIUM             Index       RIGHT ATRIUM           Index LA diam:        1.70 cm 1.00 cm/m  RA Area:     14.50 cm LA Vol (A2C):   35.0 ml 20.63 ml/m RA Volume:   29.90 ml  17.63 ml/m LA Vol (A4C):   17.9 ml 10.55 ml/m LA Biplane Vol: 26.7 ml 15.74 ml/m  AORTIC VALVE                    PULMONIC VALVE AV Area (Vmax):    2.60 cm     PV Vmax:       0.89 m/s AV Area (Vmean):   2.67 cm     PV Peak grad:  3.1 mmHg AV Area (VTI):     2.63 cm AV Vmax:           222.00 cm/s AV Vmean:          152.000 cm/s AV VTI:            0.412  m AV Peak Grad:      19.7 mmHg AV Mean Grad:      10.0 mmHg LVOT Vmax:         139.00 cm/s LVOT Vmean:        97.700 cm/s LVOT VTI:          0.261 m LVOT/AV VTI ratio: 0.63  AORTA Ao Root diam: 3.00 cm Ao Asc diam:  3.40 cm MITRAL VALVE MV Area (PHT): 2.12 cm     SHUNTS MV E velocity: 75.30 cm/s   Systemic VTI:  0.26 m MV A velocity: 132.00 cm/s  Systemic Diam: 2.30 cm MV E/A ratio:  0.57 Kirk Ruths MD Electronically signed by Kirk Ruths MD Signature Date/Time: 11/10/2020/12:42:08 PM    Final    VAS [place] TEMPORAL ARTERY BILATERAL  Result Date: 11/10/2020  TEMPORAL ARTERY REPORT Patient Name:  SAMAYRA HEBEL  Date of Exam:   11/10/2020 Medical Rec #: [id number]       Accession #:    [id number] Date of Birth: [birth year]        Patient Gender: F Patient Age:   75 years Exam Location:  [facility] Procedure:      VAS [place] Referring Phys: Dagoberto Ligas --------------------------------------------------------------------------------  Indications: Gradual loss of vision- right eye and headache. High Risk Factors: Age > 68 yrs and female.  Comparison Study: No prior study Performing Technologist: Maudry Mayhew MHA, RDMS, RVT, RDCS  Examination Guidelines: Patient in reclined position. 2D, color and spectral doppler sampling in the temporal artery along the hairline and temple in the longitudinal plane. 2D images along the hairline and temple in the transverse plane. Exam is bilateral.  ++-------------------+------------------+ Right Halo POSITIVELeft Halo POSITIVE ++-------------------+------------------+ +---------------+----------+-----------+----------+-----------+                Width (cm)Lenght (cm)Width (cm)Lenght (cm) +---------------+----------+-----------+----------+-----------+ Temporal Artery0.10                                       +---------------+----------+-----------+----------+-----------+ Proximal       0.10                                        +---------------+----------+-----------+----------+-----------+  Mid            0.05                                       +---------------+----------+-----------+----------+-----------+ Distal         0.04                                       +---------------+----------+-----------+----------+-----------+ Parietal branch0.02                                       +---------------+----------+-----------+----------+-----------+ Frontal branch 0.04                                       +---------------+----------+-----------+----------+-----------+ Right superficial temporal artery exhibits hyperechoic halo sign with intimal thickness measuring 0.1cm. The right superficial temporal artery is noncompressible. Left superficial temporal artery without obvious evidence of halo sign. Left superficial temporal artery is compressible. Study was technically difficult due to vessel tortuosity/cording. Summary: Absence of a "halo" sign in the left temporal artery, although not definitive, makes a diagnosis of temporal arteritis unlikely. Presence of a "halo" sign in the right temporal artery suggests temporal arteritis.  *See table(s) above for measurements and observations.  Electronically signed by Jamelle Haring on 11/10/2020 at 6:55:05 PM.   Final     Scheduled Meds:  amLODipine  10 mg Oral Daily   aspirin EC  325 mg Oral Daily   atorvastatin  40 mg Oral Daily   clopidogrel  75 mg Oral Daily   docusate sodium  100 mg Oral BID   dorzolamide  1 drop Both Eyes BID   enoxaparin (LOVENOX) injection  40 mg Subcutaneous Daily   insulin aspart  0-15 Units Subcutaneous TID WC   insulin glargine-yfgn  10 Units Subcutaneous BID   losartan  25 mg Oral Daily   predniSONE  60 mg Oral Q breakfast   sodium chloride flush  3 mL Intravenous Q12H   Continuous Infusions:  sodium chloride 10 mL/hr at 11/09/20 0930     LOS: 3 days   Time spent: 38 minutes. More than 50% of the time was spent in  counseling/coordination of care  Lorella Nimrod, MD [facility]  If 7PM-7AM, please contact night-coverage [email or web address]  11/11/2020, 4:29 PM   This record has been created using Systems analyst. Errors have been sought and corrected,but may not always be located. Such creation errors do not reflect on the standard of care.

## 2020-11-11 NOTE — Evaluation (Signed)
Occupational Therapy Evaluation Patient Details Name: Jenna Long MRN: [id number] DOB: 04-08-1934 Today's Date: 11/11/2020   History of Present Illness 86 y/o female presented to ED on 9/20 for R vision loss. Was being sent to opthamologist, however office was closed prior to arrival and decided to come to ED. 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 MRI orbits revelaed abnormal enhancement along optic nerve sheaths bilaterally, compatible with bilateral optic nerve perineuritis. 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 PMH: HTN, HLD, DM   Clinical Impression   PTA, pt lives alone and reports Modified Independence with ADLs and mobility using cane. Pt presents now with deficits in vision, balance, strength, and endurance. Pt overall Min A for mobility using cane with noted unsteadiness, Setup for UB ADLs and Mod A for LB ADLs at this time. Pt reports able to see large print with R eye, blurry at other times, and unable to see color. During ADLs at sink, pt experiencing syncopal episode, suddenly went limp with R lateral lean and decreased responsiveness. RN in quickly to assist in transfer back to bed and assessment of vitals with BP normal. Pt also noted with urine incontinence during episode (see general comments for more details).    Based on current presentation, pt at high risk for falls and with increased difficulty caring for self. Recommend CIR based on high PLOF and rehab potential as pt is motivated to return home. Plan to trial RW during ADLs for assessment of improved stability in next session.      Recommendations for follow up therapy are one component of a multi-disciplinary discharge planning process, led by the attending physician.  Recommendations may be updated based on patient status, additional functional criteria and insurance authorization.   Follow Up Recommendations   CIR;Supervision/Assistance - 24 hour    Equipment Recommendations  Other (comment) (to be determined)    Recommendations for Other Services Rehab consult     Precautions / Restrictions Precautions Precautions: Fall;Other (comment) Precaution Comments: monitor for syncopal symptoms Restrictions Weight Bearing Restrictions: No      Mobility Bed Mobility Overal bed mobility: Needs Assistance Bed Mobility: Supine to Sit;Sit to Supine     Supine to sit: Min assist;HOB elevated Sit to supine: Total assist;+2 for physical assistance;+2 for safety/equipment   General bed mobility comments: Min A to sit EOB with handheld assist and cues to scoot forward. Total A x 2 to get back to bed after onset of syncopal episode    Transfers Overall transfer level: Needs assistance Equipment used: Straight cane;1 person hand held assist Transfers: Sit to/from [facility] Sit to Stand: Min assist Stand pivot transfers: Min assist;Total assist;+2 safety/equipment;+2 physical assistance       General transfer comment: Min A for sit to stand from bed, BSC and from chair at sink with and without cane, cues to lean forward to gain balance. Pt initially Min A for pivot to [facility] due to reports of urinary urgency but required Total A x 2 for pivot back with assist from RN after syncopal episode and decreased responsiveness    Balance Overall balance assessment: Needs assistance;History of Falls Sitting-balance support: No upper extremity supported;Feet supported Sitting balance-Leahy Scale: Fair     Standing balance support: Single extremity supported;During functional activity Standing balance-Leahy Scale: Poor Standing balance comment: reliant on UE support and external assist though unsteady. Would benefit from BUE support for mobility  ADL either performed or assessed with clinical judgement   ADL Overall ADL's : Needs  assistance/impaired Eating/Feeding: Set up;Sitting Eating/Feeding Details (indicate cue type and reason): assist to open OJ cup Grooming: Set up;Sitting;Wash/dry face Grooming Details (indicate cue type and reason): seated at sink Upper Body Bathing: Set up;Sitting Upper Body Bathing Details (indicate cue type and reason): to wash under arms Lower Body Bathing: Moderate assistance;Sit to/from stand Lower Body Bathing Details (indicate cue type and reason): able to stand and wash anterior/posterior region. provided linen to dry self with beginning of syncopal episode emerging     Lower Body Dressing: Moderate assistance;Sit to/from stand Lower Body Dressing Details (indicate cue type and reason): assist to doff/don brief around posterior region Toilet Transfer: Minimal assistance;Stand-pivot;RW Toilet Transfer Details (indicate cue type and reason): via handheld assist, noted unsteadiness but able to problem solve best hand placement Toileting- Clothing Manipulation and Hygiene: Moderate assistance;Sitting/lateral lean;Sit to/from stand Toileting - Clothing Manipulation Details (indicate cue type and reason): unable to urinate as pt thought on BSC, assist to doff/don brief around bottom       General ADL Comments: Pt with noted unsteadiness in standing even with cane use, also with sudden onset of syncopal episode during ADLs at sink. Pt at increased risk for falls along with impaired R dominant vision     Vision Baseline Vision/History: 1 Wears glasses Ability to See in Adequate Light: 2 Moderately impaired Patient Visual Report: Other (comment) (absence of color in R eye, able to read large print but blurry with other print) Vision Assessment?: Vision impaired- to be further tested in functional context Additional Comments: R eye impaired, unable to see color but able to read large print of calender and "call, dont fall" sign in room. Unable to read other print on board. Reports wearing  glasses for reading only after removal of cataracts     Perception     Praxis      Pertinent Vitals/Pain Pain Assessment: No/denies pain     Hand Dominance Right   Extremity/Trunk Assessment Upper Extremity Assessment Upper Extremity Assessment: Generalized weakness   Lower Extremity Assessment Lower Extremity Assessment: Defer to PT evaluation   Cervical / Trunk Assessment Cervical / Trunk Assessment: Kyphotic   Communication Communication Communication: HOH   Cognition Arousal/Alertness: Awake/alert Behavior During Therapy: WFL for tasks assessed/performed Overall Cognitive Status: No family/caregiver present to determine baseline cognitive functioning Area of Impairment: Following commands;Safety/judgement;Awareness;Problem solving                       Following Commands: Follows one step commands with increased time Safety/Judgement: Decreased awareness of safety;Decreased awareness of deficits Awareness: Emergent Problem Solving: Slow processing;Requires verbal cues;Requires tactile cues General Comments: Pt with decreased awareness of deficits/safety, wants to go home but did not appear stable with cane use and requiring increased assist.   General Comments  Initial BP 1 hour prior to OT session WFL 180s/100s. HR increasing to 141 at times with ADLs/mobility though normalized with seated rest break. While standing at sink, completing peri care, pt with difficulty donning brief around bottom and reports "I cant do it". Pt sat down and appeared as if she would cry, OT ensured pt they would assist with task if unable to complete. When asking limitations in completing task, pt suddently went limp and R UE dropped. Pt with R lateral lean, requiring assist to correct with decreased responsiveness, glazed look on face. OT slid chair towards bed, hit call bell and  RN in to assist/assess. Pt transferred back to bed with Total A x 2 and B feet elevated. BP 159/101 (117). Pt  with confusion, denies feeling lightheaded though asked if she was going to get up again and if she was back in bed. When OT cleaning up room at end of session, large puddle of urine noted on chair pt was seated in at sink.    Exercises     Shoulder Instructions      Home Living Family/patient expects to be discharged to:: Private residence Living Arrangements: Alone Available Help at Discharge: Family;Available PRN/intermittently Type of Home: House Home Access: Stairs to enter [employer] of Steps: 3 Entrance Stairs-Rails: Right;Left;Can reach both Home Layout: One level     Bathroom Shower/Tub: Teacher, early years/pre: Handicapped height     Home Equipment: Environmental consultant - 2 wheels;Bedside commode;Cane - single point;Shower seat;Wheelchair - manual;Grab bars - tub/shower   Additional Comments: bathes at sink due to tub being too tall to step over      Prior Functioning/Environment Level of Independence: Independent with assistive device(s)        Comments: Patient states independent with ADL and community ambulator with [place]. Performs grocery shopping, manages own medications. Patient states granddaughter will be coming to stay with her at dischage. Reports one fall 9 months ago resulting in hip fx (said she fell outside, crawled to porch and stayed there all night)        OT Problem List: Decreased strength;Impaired balance (sitting and/or standing);Decreased activity tolerance;Decreased cognition;Decreased safety awareness;Decreased knowledge of use of DME or AE      OT Treatment/Interventions: Self-care/ADL training;Therapeutic exercise;DME and/or AE instruction;Therapeutic activities;Patient/family education;Balance training    OT Goals(Current goals can be found in the care plan section) Acute Rehab OT Goals Patient Stated Goal: to go home OT Goal Formulation: With patient Time For Goal Achievement: 11/25/20 Potential to Achieve Goals: Good  OT  Frequency: Min 2X/week   Barriers to D/C:            Co-evaluation              AM-PAC OT "6 Clicks" Daily Activity     Outcome Measure Help from another person eating meals?: A Little Help from another person taking care of personal grooming?: A Little Help from another person toileting, which includes using toliet, bedpan, or urinal?: A Lot Help from another person bathing (including washing, rinsing, drying)?: A Lot Help from another person to put on and taking off regular upper body clothing?: A Little Help from another person to put on and taking off regular lower body clothing?: A Lot 6 Click Score: 15   End of Session Equipment Utilized During Treatment: Gait belt;Other (comment) (cane) Nurse Communication: Mobility status  Activity Tolerance: Treatment limited secondary to medical complications (Comment) Patient left: in bed;with call bell/phone within reach;with bed alarm set  OT Visit Diagnosis: Unsteadiness on feet (R26.81);Other abnormalities of gait and mobility (R26.89);Muscle weakness (generalized) (M62.81);History of falling (Z91.81)                Time: 2703-5009 OT Time Calculation (min): 27 min Charges:  OT General Charges $OT Visit: 1 Visit OT Evaluation $OT Eval Moderate Complexity: 1 Mod OT Treatments $Self Care/Home Management : 8-22 mins  Malachy Chamber, OTR/L Acute Rehab Services Office: [phone]   Layla Maw 11/11/2020, 9:48 AM

## 2020-11-11 NOTE — Care Management Important Message (Signed)
Important Message  Patient Details  Name: Jenna Long MRN: [id number] Date of Birth: [birth year]   Medicare Important Message Given:  Yes     Aslan Himes 11/11/2020, 3:50 PM

## 2020-11-11 NOTE — Progress Notes (Signed)
Inpatient Rehab Admissions Coordinator:   Per therapy recommendations, pt was screened for CIR appropriateness.  At this time, note that she is declining CIR (see Dr. Phoebe Sharps note from today).  If she is agreeable, she could potentially be a candidate and I would recommend placing an IP Rehab MD consult order.   Shann Medal, PT, DPT Admissions Coordinator [phone] 11/11/20  1:24 PM

## 2020-11-12 DIAGNOSIS — E119 Type 2 diabetes mellitus without complications: Secondary | ICD-10-CM | POA: Diagnosis not present

## 2020-11-12 DIAGNOSIS — M316 Other giant cell arteritis: Secondary | ICD-10-CM | POA: Diagnosis not present

## 2020-11-12 DIAGNOSIS — H5461 Unqualified visual loss, right eye, normal vision left eye: Secondary | ICD-10-CM | POA: Diagnosis not present

## 2020-11-12 DIAGNOSIS — I10 Essential (primary) hypertension: Secondary | ICD-10-CM | POA: Diagnosis not present

## 2020-11-12 LAB — GLUCOSE, CAPILLARY
Glucose-Capillary: 125 mg/dL — ABNORMAL HIGH (ref 70–99)
Glucose-Capillary: 142 mg/dL — ABNORMAL HIGH (ref 70–99)
Glucose-Capillary: 123 mg/dL — ABNORMAL HIGH (ref 70–99)
Glucose-Capillary: 190 mg/dL — ABNORMAL HIGH (ref 70–99)

## 2020-11-12 NOTE — PMR Pre-admission (Signed)
PMR Admission Coordinator Pre-Admission Assessment  Patient: Jenna Long is an 86 y.o., female MRN: [id number] DOB: [birth year] Height: 5' 7"  (170.2 cm) Weight: 60 kg  Insurance Information HMO:     PPO: yes     PCP:      IPA:      80/20:      OTHER:  PRIMARY: [employer]      Policy#: [id number]      Subscriber: Pt.  CM Name: Colletta [place]      Phone#: [phone]     Fax#: [phone] Colletta [place] with [employer] called 11/16/20 with approval for admission for 7 days.  Pre-Cert#: [id number]      Employer:  Benefits:  Phone #:      Name:  [id number] Date: 02/19/2014 - still active Deductible: no deductible ($0) OOP Max: $3,450 ($1,359.94 met) CIR: $325/day co-pay for days 1-6, $0/day days 7-90 SNF:  $0/day co-pay for days 1-20, $184/day co-pay for days 21-100; limited to 100 days/benefit period Outpatient: $30/visit co-pay; limited by medical necessity Home Health:  100% coverage DME: 80% coverage; 20% co-insurance  SECONDARY:  none      Policy#:      Phone#:   Development worker, community:       Phone#:   The Therapist, art Information Summary" for patients in Inpatient Rehabilitation Facilities with attached "Privacy Act [place] Records" was provided and verbally reviewed with: Patient  Emergency Contact Information Contact Information     Name Relation Home Work Mobile   [place] Daughter [phone]         Current Medical History  Patient Admitting Diagnosis:Temporal arteritis, CVA History of Present Illness: Jenna Long is a 86 year old right-handed female with history of hypertension, hyperlipidemia, diabetes mellitus and left hip hemiarthroplasty 02/18/2020 after a fall.  Per chart review patient lives alone.  1 level home 3 steps to entry.  Independent with assistive device.  She performs her own grocery shopping and manages medications.  Her granddaughter will be providing assistance on discharge.  Presented 11/07/2020 with right eye vision loss.  She denied any  headache but did have some temporal discomfort over the past 2 weeks since her sinuses were giving her problems.  CT/MRI showed 2 subcentimeter acute infarcts within the right inferior right cerebellar hemisphere.  Signal abnormality within the right vertebral artery at the level of the foramen magnum suggesting high-grade stenosis or vessel occlusion at that site.  Chronic lacunar infarcts within the left cerebral hemispheric white matter bilateral basal ganglia and bilateral thalami.  MRI orbits abnormal enhancement along the optic nerve sheaths bilaterally, compatible with bilateral optic nerve perineuritis.  CT angiogram head and neck severe stenosis of the mid and distal basilar artery and bilateral P2 PCAs.  Severe stenosis of distal M1 and proximal M2 MCA branches bilaterally.  Severe stenosis of the left A2 ACA.  Multifocal severe stenosis of the right vertebral artery in the neck with occlusion at the C2-3 level and irregular opacification of a small V3/V4 vertebral artery from collaterals.  Bilateral carotid bifurcation atherosclerosis without greater than 50% stenosis.  Echocardiogram with ejection fraction of 70 to 75% no wall motion abnormalities grade 1 diastolic dysfunction.  Her admission chemistries were unremarkable except sodium 134 glucose 205, hemoglobin 10, sedimentation rate 111, C-reactive protein 8.6, hemoglobin A1c 6.3.  Neurology follow-up concern for temporal arteriolitis with right temporal artery biopsy completed 11/09/2020 per Dr. Stanford Breed.  She did complete a 3-day course of Solu-Medrol and started on prednisone 60 mg daily..  Biopsy results  confirm diagnosis of temporal arteritis.  Plan is for very slow taper of prednisone decreased to 10 mg every 2 weeks at follow-up with rheumatology as well as neurology.  Patient did have an episode of orthostatic hypotension while standing at the sink 11/11/2020 dropping to 97 and monitored.  She is currently maintained on aspirin 81 mg daily and  Plavix 75 mg day x3 weeks and aspirin alone.  Placed on Lovenox for DVT prophylaxis.  Tolerating a regular consistency diet.    Complete NIHSS TOTAL: 0  Patient's medical record from [facility] has been reviewed by the rehabilitation admission coordinator and physician.  Past Medical History  Past Medical History:  Diagnosis Date   Diabetes mellitus type II    DNR (do not resuscitate) 11/08/2020   Hypercholesteremia    Hypertension     Has the patient had major surgery during 100 days prior to admission? Yes  Family History   family history is not on file.  Current Medications  Current Facility-Administered Medications:    0.9 %  sodium chloride infusion, , Intravenous, Continuous, Dagoberto Ligas, PA-C, Last Rate: 10 mL/hr at 11/09/20 0930, Restarted at 11/09/20 1006   acetaminophen (TYLENOL) tablet 650 mg, 650 mg, Oral, Q6H PRN, 650 mg at 11/10/20 1245 **OR** acetaminophen (TYLENOL) suppository 650 mg, 650 mg, Rectal, Q6H PRN, Dagoberto Ligas, PA-C   amLODipine (NORVASC) tablet 10 mg, 10 mg, Oral, Daily, Lorella Nimrod, MD, 10 mg at 11/12/20 8099   [COMPLETED] aspirin tablet 325 mg, 325 mg, Oral, Once, 325 mg at 11/08/20 1858 **FOLLOWED BY** aspirin EC tablet 325 mg, 325 mg, Oral, Daily, Dagoberto Ligas, PA-C, 325 mg at 11/12/20 0813   atorvastatin (LIPITOR) tablet 40 mg, 40 mg, Oral, Daily, [name], MD, 40 mg at 11/12/20 0813   bisacodyl (DULCOLAX) EC tablet 5 mg, 5 mg, Oral, Daily PRN, Dagoberto Ligas, PA-C   [COMPLETED] clopidogrel (PLAVIX) tablet 300 mg, 300 mg, Oral, Once, 300 mg at 11/08/20 1858 **FOLLOWED BY** clopidogrel (PLAVIX) tablet 75 mg, 75 mg, Oral, Daily, Dagoberto Ligas, PA-C, 75 mg at 11/12/20 0813   docusate sodium (COLACE) capsule 100 mg, 100 mg, Oral, BID, Eveland, Matthew, PA-C, 100 mg at 11/12/20 0813   dorzolamide (TRUSOPT) 2 % ophthalmic solution 1 drop, 1 drop, Both Eyes, BID, Eveland, Matthew, PA-C, 1 drop at 11/12/20 0817    enoxaparin (LOVENOX) injection 40 mg, 40 mg, Subcutaneous, Daily, Dagoberto Ligas, PA-C, 40 mg at 11/12/20 8338   hydrALAZINE (APRESOLINE) injection 5 mg, 5 mg, Intravenous, Q4H PRN, Dagoberto Ligas, PA-C   HYDROcodone-acetaminophen (NORCO/VICODIN) 5-325 MG per tablet 1-2 tablet, 1-2 tablet, Oral, Q4H PRN, Dagoberto Ligas, PA-C, 1 tablet at 11/10/20 1301   insulin aspart (novoLOG) injection 0-15 Units, 0-15 Units, Subcutaneous, TID WC, Dagoberto Ligas, PA-C, 2 Units at 11/12/20 1159   insulin glargine-yfgn (SEMGLEE) injection 10 Units, 10 Units, Subcutaneous, BID, Lorella Nimrod, MD, 10 Units at 11/12/20 0815   losartan (COZAAR) tablet 25 mg, 25 mg, Oral, Daily, Lorella Nimrod, MD, 25 mg at 11/12/20 0813   morphine 2 MG/ML injection 2 mg, 2 mg, Intravenous, Q2H PRN, Eveland, Matthew, PA-C   ondansetron (ZOFRAN) tablet 4 mg, 4 mg, Oral, Q6H PRN **OR** ondansetron (ZOFRAN) injection 4 mg, 4 mg, Intravenous, Q6H PRN, Dagoberto Ligas, PA-C, 4 mg at 11/09/20 2505   polyethylene glycol (MIRALAX / GLYCOLAX) packet 17 g, 17 g, Oral, Daily PRN, Eveland, Matthew, PA-C   predniSONE (DELTASONE) tablet 60 mg, 60 mg, Oral, Q breakfast, Lorella Nimrod, MD, 60  mg at 11/12/20 0037   sodium chloride flush (NS) 0.9 % injection 3 mL, 3 mL, Intravenous, Q12H, Dagoberto Ligas, PA-C, 3 mL at 11/11/20 2233  Patients Current Diet:  Diet Order             Diet heart healthy/carb modified Room service appropriate? Yes with Assist; Fluid consistency: Thin  Diet effective now                   Precautions / Restrictions Precautions Precautions: Fall, Other (comment) Precaution Comments: monitor for syncopal symptoms Restrictions Weight Bearing Restrictions: No   Has the patient had 2 or more falls or a fall with injury in the past year? Yes  Prior Activity Level Limited Community (1-2x/wk): Pt. went out 1x a week  Prior Functional Level Self Care: Did the patient need help bathing, dressing, using the  toilet or eating? Independent  Indoor Mobility: Did the patient need assistance with walking from room to room (with or without device)? Independent  Stairs: Did the patient need assistance with internal or external stairs (with or without device)? Independent  Functional Cognition: Did the patient need help planning regular tasks such as shopping or remembering to take medications? Independent  Patient Information Are you of Hispanic, Latino/a,or Spanish origin?: A. No, not of Hispanic, Latino/a, or Spanish origin What is your race?: A. White Do you need or want an interpreter to communicate with a doctor or health care staff?: 0. No  Patient's Response To:  Health Literacy and Transportation Is the patient able to respond to health literacy and transportation needs?: No  Home Assistive Devices / [place] Devices/Equipment: Eyeglasses, Radio producer (specify quad or straight), Other (Comment), CBG Meter (gripper to get clothes on) Home Equipment: Environmental consultant - 2 wheels, Bedside commode, Cane - single point, [employer], Wheelchair - manual, Grab bars - tub/shower  Prior Device Use: Indicate devices/aids used by the patient prior to current illness, exacerbation or injury? None of the above  Current Functional Level Cognition  Overall Cognitive Status:  (family present but not commenting on pt) Current Attention Level: Selective Orientation Level: Oriented X4 Following Commands: Follows one step commands inconsistently, Follows one step commands with increased time Safety/Judgement: Decreased awareness of safety, Decreased awareness of deficits General Comments: pt is clearly limited with standing tolerance and unabel to get to chair this AM, but asking when she is leaving for home    Extremity Assessment (includes Sensation/Coordination)  Upper Extremity Assessment: Generalized weakness  Lower Extremity Assessment: Defer to PT evaluation RLE Coordination: decreased gross motor,  decreased fine motor LLE Coordination: decreased fine motor, decreased gross motor    ADLs  Overall ADL's : Needs assistance/impaired Eating/Feeding: Set up, Sitting Eating/Feeding Details (indicate cue type and reason): assist to open OJ cup Grooming: Set up, Sitting, Wash/dry face Grooming Details (indicate cue type and reason): seated at sink Upper Body Bathing: Set up, Sitting Upper Body Bathing Details (indicate cue type and reason): to wash under arms Lower Body Bathing: Moderate assistance, Sit to/from stand Lower Body Bathing Details (indicate cue type and reason): able to stand and wash anterior/posterior region. provided linen to dry self with beginning of syncopal episode emerging Lower Body Dressing: Moderate assistance, Sit to/from stand Lower Body Dressing Details (indicate cue type and reason): assist to doff/don brief around posterior region Toilet Transfer: Minimal assistance, Stand-pivot, RW Toilet Transfer Details (indicate cue type and reason): via handheld assist, noted unsteadiness but able to problem solve best hand placement Toileting- Clothing  Manipulation and Hygiene: Moderate assistance, Sitting/lateral lean, Sit to/from stand Toileting - Clothing Manipulation Details (indicate cue type and reason): unable to urinate as pt thought on BSC, assist to doff/don brief around bottom General ADL Comments: Pt with noted unsteadiness in standing even with cane use, also with sudden onset of syncopal episode during ADLs at sink. Pt at increased risk for falls along with impaired R dominant vision    Mobility  Overal bed mobility: Needs Assistance Bed Mobility: Supine to Sit, Sit to Supine Supine to sit: Min assist Sit to supine: Mod assist General bed mobility comments: mod assist with vc's for sequence to return to bed    Transfers  Overall transfer level: Needs assistance Equipment used: 1 person hand held assist, Rolling walker (2 wheeled) (CNA standing  by) Transfers: Sit to/from Stand Sit to Stand: Min assist Stand pivot transfers: Min assist, Total assist, +2 safety/equipment, +2 physical assistance General transfer comment: min assist with cues for hand placement and sequence    Ambulation / Gait / Stairs / Wheelchair Mobility  Ambulation/Gait Ambulation/Gait assistance: Herbalist (Feet): 20 Feet Assistive device: Straight cane Gait Pattern/deviations: Step-to pattern, Decreased stride length, Ataxic, Wide base of support, Trunk flexed General Gait Details: pt deferred due to light headed feelings Gait velocity: decreased    Posture / Balance Balance Overall balance assessment: Needs assistance, History of Falls Sitting-balance support: Feet supported Sitting balance-Leahy Scale: Fair Standing balance support: Bilateral upper extremity supported, During functional activity Standing balance-Leahy Scale: Poor Standing balance comment: requires walker and holding pt due to recent near syncopal episode    Special needs/care consideration Skin surgical incision to the temple Diabetic Management: Novolog sQ 0-15 units 3x. Day with meals. Semglee sQ8 units 2x/day   Previous Home Environment (from acute therapy documentation) Living Arrangements: Alone  Lives With: Alone Available Help at Discharge: Family, Available PRN/intermittently Type of Home: House Home Layout: One level Home Access: Stairs to enter Entrance Stairs-Rails: Right, Left, Can reach both Entrance Stairs-Number of Steps: 3 Bathroom Shower/Tub: Chiropodist: Handicapped height Home Care Services: No Additional Comments: bathes at sink due to tub being too tall to step over  Discharge Living Setting Plans for Discharge Living Setting: Patient's home Type of Home at Discharge: House Discharge Home Layout: One level Discharge Home Access: Stairs to enter Entrance Stairs-Rails: Right, Left, Can reach both Entrance Stairs-Number  of Steps: 3 Discharge Bathroom Shower/Tub: Tub/shower unit Discharge Bathroom Toilet: Handicapped height Discharge Bathroom Accessibility: Yes How Accessible: Accessible via walker, Accessible via wheelchair Does the patient have any problems obtaining your medications?: No  Social/Family/Support Systems Patient Roles: Other (Comment) Contact Information: [phone] Anticipated Caregiver: Derryl [place] (daughter) Anticipated Caregiver's Contact Information: [phone] Ability/Limitations of Caregiver: Can do min A Caregiver Availability: 24/7 Discharge Plan Discussed with Primary Caregiver: Yes Is Caregiver In Agreement with Plan?: Yes Does Caregiver/Family have Issues with Lodging/Transportation while Pt is in Rehab?: No  Goals Patient/Family Goal for Rehab: PT/OT Supervision Expected length of stay: 5-7 days Pt/Family Agrees to Admission and willing to participate: Yes Program Orientation Provided & Reviewed with Pt/Caregiver Including Roles  & Responsibilities: Yes  Decrease burden of Care through IP rehab admission: Specialzed equipment needs, Decrease number of caregivers, Bowel and bladder program, and Patient/family education  Possible need for SNF placement upon discharge: not anticipated   Patient Condition: I have reviewed medical records from [facility], spoken with CM, and patient. I met with patient at the bedside for inpatient  rehabilitation assessment.  Patient will benefit from ongoing PT and OT, can actively participate in 3 hours of therapy a day 5 days of the week, and can make measurable gains during the admission.  Patient will also benefit from the coordinated team approach during an Inpatient Acute Rehabilitation admission.  The patient will receive intensive therapy as well as Rehabilitation physician, nursing, social worker, and care management interventions.  Due to safety, skin/wound care, disease management, medication administration, pain  management, and patient education the patient requires 24 hour a day rehabilitation nursing.  The patient is currently min-mod A with mobility and basic ADLs.  Discharge setting and therapy post discharge at home with home health is anticipated.  Patient has agreed to participate in the Acute Inpatient Rehabilitation Program and will admit Saturday, 11/19/20.  Preadmission Screen Completed By:  Genella Mech, 11/12/2020 2:18 PM ______________________________________________________________________   Discussed status with Dr. Posey Pronto on 9/30 at 9:30 and received approval for admission today.  Admission Coordinator:  Genella Mech, CCC-SLP, time 1100/Date 11/19/2020   Assessment/Plan: Diagnosis:  Right cerebellar infarct  Does the need for close, 24 hr/day Medical supervision in concert with the patient's rehab needs make it unreasonable for this patient to be served in a less intensive setting? Yes Co-Morbidities requiring supervision/potential complications: temporal arteritis, diabetes, hypertension Due to bladder management, bowel management, safety, skin/wound care, disease management, medication administration, pain management, and patient education, does the patient require 24 hr/day rehab nursing? Yes Does the patient require coordinated care of a physician, rehab nurse, PT, OT, and SLP to address physical and functional deficits in the context of the above medical diagnosis(es)? Yes Addressing deficits in the following areas: balance, endurance, locomotion, strength, transferring, bowel/bladder control, bathing, dressing, feeding, grooming, toileting, cognition, and psychosocial support Can the patient actively participate in an intensive therapy program of at least 3 hrs of therapy 5 days a week? Yes The potential for patient to make measurable gains while on inpatient rehab is good Anticipated functional outcomes upon discharge from inpatient rehab: supervision and min assist PT, supervision  and min assist OT, modified independent and supervision SLP Estimated rehab length of stay to reach the above functional goals is: 10-14 days. Anticipated discharge destination: Home 10. Overall Rehab/Functional Prognosis: good   MD Signature: Delice Lesch, MD, ABPMR

## 2020-11-12 NOTE — Progress Notes (Signed)
Inpatient Rehab Admissions Coordinator:   Met with patient at bedside to discuss potential CIR admission. Pt. Stated interest. I will reach out to family to confirm support at d/c. Will pursue for potential admit , pending bed availability, insurance auth, and medical readiness.   Geniva Lohnes, MS, CCC-SLP Rehab Admissions Coordinator  [phone] (celll) [phone] (office) 

## 2020-11-12 NOTE — Progress Notes (Signed)
PROGRESS NOTE    Jenna Long  WSF:[id number] DOB: 1935/01/20 DOA: 11/07/2020 PCP: Sharilyn Sites, MD   Brief Narrative: Taken from H&P. Jenna Long is a 86 y.o. female with medical history significant of HTN; HLD; and DM presenting with R eye vision loss. She has had inner ear and sinus problems.  The inner ear went away but the sinus stopped her head up.  When it started again, she noticed a film over her R eye.  She called Dr. Rosana Hoes and made an appointment for Monday at 3:15.  She was concerned that she had lost her eyesight.  Dr. Rosana Hoes understood what was going on and called Dr. Katy Fitch for an appointment at 5pm  They were late for the appointment and so they came to the ER.  It has not changed in the ER.  The film has gone but the vision is completely gone -0 can not even see light.  No headache but she has had temporal discomfort for about 2 weeks since her sinuses got stopped up.  She gets pulsating pains in her R temporal region and goes to lie down until they improve. Elevated inflammatory markers.  COVID-19 negative.  MRI with new small right cerebellar infarct. 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  Concern of giant cell arteritis-patient underwent right temporal artery biopsy on 11/09/2020.  She was also started on high-dose steroid for 3 days-completed a course today. Temporal artery duplex with Halo sign on right which is consistent with temporal arteritis.  No sign on left.  Still no vision on right-can be a permanent vision loss as symptoms started approximately 2 weeks ago.  She will be started on prednisone at 1 mg/kg/day from tomorrow and will need a very slow taper, like 50 mg after 2 weeks, decreasing 10 mg every 2 weeks.  Patient will also need outpatient neurology and rheumatology appointments for further management.  9/24: Patient decided to proceed with CIR-medically stable awaiting CIR evaluation, bed availability and insurance  authorization  Subjective: Patient was seen and examined today.  Denies any dizziness, no headaches.  Still has no vision in the right eye.  She was able to sit in chair for breakfast.  She agrees to go to inpatient rehab.  Assessment & Plan:   Principal Problem:   Temporal arteritis ([place]) Active Problems:   Essential hypertension   Mixed hyperlipidemia   Diabetes mellitus type 2 in nonobese (HCC)   DNR (do not resuscitate)   Cerebral thrombosis with cerebral infarction  Right vision loss.  Patient also had subacute temporal pain.  Recent history of sinus congestion.  Elevated CRP.  Concern of giant cell arteritis/temporal arteritis. Vascular surgery was consulted and patient underwent successful temporal artery biopsy on 9/21, pending pathology results. 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  2 small acute infarcts involving the inferior right cerebellar hemisphere.  Chronic lacunar infarcts. Arterial duplex of temporal arteries positive for halo sign on right which is consistent with temporal arteritis.  Completed a 3-day course of high-dose steroid. -Start her on 60 mg of prednisone daily -she will need a very slow taper over the course of many weeks in the future.  Decreased 10 mg every 2-week.  She will also need a close follow-up by PCP and vascular surgery.  She will also need follow-up with rheumatology and neurology. Most likely a permanent right sided vision loss. -Follow-up temporal artery biopsy results. -Continue Trusopt in B eyes for now (glaucoma)  Presyncope.  Most likely  vasovagal as patient was working with OT and trying to do some cleanup at the sink. -Check orthostatic vitals-negative  Acute small inferior right cerebellar infarcts.  CTA with moderate to severe stenosis involving multiple vessels which include basilar, bilateral P2 PCAs.  Severe stenosis of M1 and M2 of MCA bilaterally.  Severe stenosis of left ACA.  Severe stenosis of right vertebral  artery and moderate stenosis of left vertebral artery.  Moderate stenosis of carotid bifurcation.   Lipid panel with LDL of 100, goal of less than 70. -Continue with statin  Type 2 diabetes mellitus.  On Glucophage and Januvia at home.  Well-controlled with A1c of 6.3. CBC elevated as she is on steroid. -Increase long-acting to 10 units twice daily. -Continue with SSI  Hypertension.  Blood pressure improving after addition of losartan -Continue amlodipine to 10 mg daily -Continue losartan and monitor.  Objective: Vitals:   11/12/20 0333 11/12/20 0812 11/12/20 1133 11/12/20 1652  BP: (!) 146/75 (!) 154/79 139/76 (!) 133/97  Pulse: 84 89 92 75  Resp: 18 18 19 20   Temp: 98.7 F (37.1 C) 98.1 F (36.7 C) 98 F (36.7 C) 97.7 F (36.5 C)  TempSrc: Oral Oral Oral Oral  SpO2: 97% 98% 96% 97%  Weight:      Height:        Intake/Output Summary (Last 24 hours) at 11/12/2020 1722 Last data filed at 11/12/2020 1235 Gross per 24 hour  Intake 240 ml  Output 0 ml  Net 240 ml    Filed Weights   11/08/20 2033 11/08/20 2304 11/09/20 0928  Weight: 68.5 kg 60 kg 60 kg    Examination:  General.  Well-developed elderly lady, in no acute distress. Pulmonary.  Lungs clear bilaterally, normal respiratory effort. CV.  Regular rate and rhythm, no JVD, rub or murmur. Abdomen.  Soft, nontender, nondistended, BS positive. CNS.  Alert and oriented x3.  No focal neurologic deficit. Extremities.  No edema, no cyanosis, pulses intact and symmetrical. Psychiatry.  Judgment and insight appears normal.   DVT prophylaxis: Lovenox Code Status: DNR Family Communication:  Disposition Plan:  Status is: Inpatient  Remains inpatient appropriate because:Inpatient level of care appropriate due to severity of illness  Dispo: The patient is from: Home              Anticipated d/c is to: CIR              Patient currently is medically stable   Difficult to place patient No              Level of care:  Telemetry Medical  All the records are reviewed and case discussed with Care Management/Social Worker. Management plans discussed with the patient, nursing and they are in agreement.  Consultants:  Neurology Vascular surgery  Procedures:  Right temporal artery biopsy  Antimicrobials:   Data Reviewed: I have personally reviewed following labs and imaging studies  CBC: Recent Labs  Lab 11/07/20 2046 11/07/20 2137 11/09/20 0144  WBC 6.7  --  3.3*  NEUTROABS 4.1  --   --   HGB 10.0* 10.9* 10.0*  HCT 32.2* 32.0* 31.5*  MCV 89.7  --  86.5  PLT 516*  --  457*    Basic Metabolic Panel: Recent Labs  Lab 11/07/20 2046 11/07/20 2137 11/09/20 0144  NA 134* 136 133*  K 4.3 4.3 4.0  CL 99 100 99  CO2 24  --  24  GLUCOSE 205* 207* 252*  BUN 18 19  13  CREATININE 0.81 0.80 0.63  CALCIUM 9.3  --  9.0    GFR: Estimated Creatinine Clearance: 49.6 mL/min (by C-G formula based on SCr of 0.63 mg/dL). Liver Function Tests: Recent Labs  Lab 11/07/20 2046  AST 13*  ALT 13  ALKPHOS 81  BILITOT 0.5  PROT 8.0  ALBUMIN 2.9*    No results for input(s): LIPASE, AMYLASE in the last 168 hours. No results for input(s): AMMONIA in the last 168 hours. Coagulation Profile: Recent Labs  Lab 11/07/20 2046  INR 1.1    Cardiac Enzymes: No results for input(s): CKTOTAL, CKMB, CKMBINDEX, TROPONINI in the last 168 hours. BNP (last 3 results) No results for input(s): PROBNP in the last 8760 hours. HbA1C: No results for input(s): HGBA1C in the last 72 hours.  CBG: Recent Labs  Lab 11/11/20 1627 11/11/20 2135 11/12/20 0604 11/12/20 1131 11/12/20 1651  GLUCAP 188* 216* 125* 142* 123*    Lipid Profile: No results for input(s): CHOL, HDL, LDLCALC, TRIG, CHOLHDL, LDLDIRECT in the last 72 hours.  Thyroid Function Tests: No results for input(s): TSH, T4TOTAL, FREET4, T3FREE, THYROIDAB in the last 72 hours. Anemia Panel: No results for input(s): VITAMINB12, FOLATE, FERRITIN,  TIBC, IRON, RETICCTPCT in the last 72 hours. Sepsis Labs: No results for input(s): PROCALCITON, LATICACIDVEN in the last 168 hours.  Recent Results (from the past 240 hour(s))  SARS CORONAVIRUS 2 ([name] 6-24 HRS) Nasopharyngeal Nasopharyngeal Swab     Status: None   Collection Time: 11/08/20 10:18 AM   Specimen: Nasopharyngeal Swab  Result Value Ref Range Status   SARS Coronavirus 2 NEGATIVE NEGATIVE Final    Comment: (NOTE) SARS-CoV-2 target nucleic acids are NOT DETECTED.  The SARS-CoV-2 RNA is generally detectable in upper and lower respiratory specimens during the acute phase of infection. Negative results do not preclude SARS-CoV-2 infection, do not rule out co-infections with other pathogens, and should not be used as the sole basis for treatment or other patient management decisions. Negative results must be combined with clinical observations, patient history, and epidemiological information. The expected result is Negative.  Fact Sheet for Patients: SugarRoll.be  Fact Sheet for Healthcare Providers: https://www.woods-mathews.com/  This test is not yet approved or cleared by the [place] FDA and  has been authorized for detection and/or diagnosis of SARS-CoV-2 by FDA under an Emergency Use Authorization (EUA). This EUA will remain  in effect (meaning this test can be used) for the duration of the COVID-19 declaration under Se ction 564(b)(1) of the Act, 21 U.S.C. section 360bbb-3(b)(1), unless the authorization is terminated or revoked sooner.  Performed at [facility], [place] [address] [place], [place] [address]       Radiology Studies: No results found.  Scheduled Meds:  amLODipine  10 mg Oral Daily   aspirin EC  325 mg Oral Daily   atorvastatin  40 mg Oral Daily   clopidogrel  75 mg Oral Daily   docusate sodium  100 mg Oral BID   dorzolamide  1 drop Both Eyes BID   enoxaparin (LOVENOX) injection  40 mg  Subcutaneous Daily   insulin aspart  0-15 Units Subcutaneous TID WC   insulin glargine-yfgn  10 Units Subcutaneous BID   losartan  25 mg Oral Daily   predniSONE  60 mg Oral Q breakfast   sodium chloride flush  3 mL Intravenous Q12H   Continuous Infusions:  sodium chloride 10 mL/hr at 11/09/20 0930     LOS: 4 days   Time  spent: 35 minutes. More than 50% of the time was spent in counseling/coordination of care  Lorella Nimrod, MD [facility]  If 7PM-7AM, please contact night-coverage [email or web address]  11/12/2020, 5:22 PM   This record has been created using Systems analyst. Errors have been sought and corrected,but may not always be located. Such creation errors do not reflect on the standard of care.

## 2020-11-12 NOTE — Plan of Care (Signed)
Progressing, will continue to monitor.  

## 2020-11-13 DIAGNOSIS — I10 Essential (primary) hypertension: Secondary | ICD-10-CM | POA: Diagnosis not present

## 2020-11-13 DIAGNOSIS — E119 Type 2 diabetes mellitus without complications: Secondary | ICD-10-CM | POA: Diagnosis not present

## 2020-11-13 DIAGNOSIS — H5461 Unqualified visual loss, right eye, normal vision left eye: Secondary | ICD-10-CM | POA: Diagnosis not present

## 2020-11-13 DIAGNOSIS — M316 Other giant cell arteritis: Secondary | ICD-10-CM | POA: Diagnosis not present

## 2020-11-13 LAB — GLUCOSE, CAPILLARY
Glucose-Capillary: 137 mg/dL — ABNORMAL HIGH (ref 70–99)
Glucose-Capillary: 156 mg/dL — ABNORMAL HIGH (ref 70–99)
Glucose-Capillary: 281 mg/dL — ABNORMAL HIGH (ref 70–99)
Glucose-Capillary: 183 mg/dL — ABNORMAL HIGH (ref 70–99)

## 2020-11-13 NOTE — Progress Notes (Signed)
PROGRESS NOTE    Jenna Long  VEH:[id number] DOB: 1935/01/02 DOA: 11/07/2020 PCP: Sharilyn Sites, MD   Brief Narrative: Taken from H&P. Jenna Long is a 86 y.o. female with medical history significant of HTN; HLD; and DM presenting with R eye vision loss. She has had inner ear and sinus problems.  The inner ear went away but the sinus stopped her head up.  When it started again, she noticed a film over her R eye.  She called Dr. Rosana Hoes and made an appointment for Monday at 3:15.  She was concerned that she had lost her eyesight.  Dr. Rosana Hoes understood what was going on and called Dr. Katy Fitch for an appointment at 5pm  They were late for the appointment and so they came to the ER.  It has not changed in the ER.  The film has gone but the vision is completely gone -0 can not even see light.  No headache but she has had temporal discomfort for about 2 weeks since her sinuses got stopped up.  She gets pulsating pains in her R temporal region and goes to lie down until they improve. Elevated inflammatory markers.  COVID-19 negative.  MRI with new small right cerebellar infarct. 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  Concern of giant cell arteritis-patient underwent right temporal artery biopsy on 11/09/2020.  She was also started on high-dose steroid for 3 days-completed a course today. Temporal artery duplex with Halo sign on right which is consistent with temporal arteritis.  No sign on left.  Still no vision on right-can be a permanent vision loss as symptoms started approximately 2 weeks ago.  She will be started on prednisone at 1 mg/kg/day from tomorrow and will need a very slow taper, like 50 mg after 2 weeks, decreasing 10 mg every 2 weeks.  Patient will also need outpatient neurology and rheumatology appointments for further management.  9/24: Patient decided to proceed with CIR-medically stable awaiting CIR evaluation, bed availability and insurance  authorization  Subjective: Patient was seen and examined today.  No new complaints.  No dizziness or pain.  Still low vision in right eye.  Waiting for CIR approval.  Assessment & Plan:   Principal Problem:   Temporal arteritis ([place]) Active Problems:   Essential hypertension   Mixed hyperlipidemia   Diabetes mellitus type 2 in nonobese (HCC)   DNR (do not resuscitate)   Cerebral thrombosis with cerebral infarction  Right vision loss.  Patient also had subacute temporal pain.  Recent history of sinus congestion.  Elevated CRP.  Concern of giant cell arteritis/temporal arteritis. Vascular surgery was consulted and patient underwent successful temporal artery biopsy on 9/21, pending pathology results. 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  2 small acute infarcts involving the inferior right cerebellar hemisphere.  Chronic lacunar infarcts. Arterial duplex of temporal arteries positive for halo sign on right which is consistent with temporal arteritis.  Completed a 3-day course of high-dose steroid. -Start her on 60 mg of prednisone daily, she was started on 11/11/2020-she will need a very slow taper over the course of many weeks in the future.  Decreased 10 mg every 2-week.  She will also need a close follow-up by PCP and vascular surgery.  She will also need follow-up with rheumatology and neurology. Most likely a permanent right sided vision loss. -Follow-up temporal artery biopsy results. -Continue Trusopt in B eyes for now (glaucoma)  Presyncope.  Most likely vasovagal as patient was working with OT and  trying to do some cleanup at the sink. -Check orthostatic vitals-negative -PT is recommending CIR-pending insurance authorization  Acute small inferior right cerebellar infarcts.  CTA with moderate to severe stenosis involving multiple vessels which include basilar, bilateral P2 PCAs.  Severe stenosis of M1 and M2 of MCA bilaterally.  Severe stenosis of left ACA.  Severe  stenosis of right vertebral artery and moderate stenosis of left vertebral artery.  Moderate stenosis of carotid bifurcation.   Lipid panel with LDL of 100, goal of less than 70. -Continue with statin  Type 2 diabetes mellitus.  On Glucophage and Januvia at home.  Well-controlled with A1c of 6.3. CBC elevated as she is on steroid. -Increase long-acting to 10 units twice daily. -Continue with SSI  Hypertension.  Blood pressure improving after addition of losartan -Continue amlodipine to 10 mg daily -Continue losartan and monitor.  Objective: Vitals:   11/13/20 0335 11/13/20 0737 11/13/20 1209 11/13/20 1609  BP: 129/74 138/79 (!) 153/74 133/76  Pulse: 80 79 93 97  Resp: 17 17 17 17   Temp: 97.6 F (36.4 C) 97.7 F (36.5 C) 97.8 F (36.6 C) 97.8 F (36.6 C)  TempSrc: Oral Oral Oral Oral  SpO2: 97% 96% 95% 98%  Weight:      Height:        Intake/Output Summary (Last 24 hours) at 11/13/2020 1709 Last data filed at 11/13/2020 1600 Gross per 24 hour  Intake 120 ml  Output 600 ml  Net -480 ml    Filed Weights   11/08/20 2033 11/08/20 2304 11/09/20 0928  Weight: 68.5 kg 60 kg 60 kg    Examination:  General.  Pleasant elderly lady, in no acute distress. Pulmonary.  Lungs clear bilaterally, normal respiratory effort. CV.  Regular rate and rhythm, no JVD, rub or murmur. Abdomen.  Soft, nontender, nondistended, BS positive. CNS.  Alert and oriented .  No focal neurologic deficit. Extremities.  No edema, no cyanosis, pulses intact and symmetrical. Psychiatry.  Judgment and insight appears normal.   DVT prophylaxis: Lovenox Code Status: DNR Family Communication:  Disposition Plan:  Status is: Inpatient  Remains inpatient appropriate because:Inpatient level of care appropriate due to severity of illness  Dispo: The patient is from: Home              Anticipated d/c is to: CIR              Patient currently is medically stable   Difficult to place patient No               Level of care: Telemetry Medical  All the records are reviewed and case discussed with Care Management/Social Worker. Management plans discussed with the patient, nursing and they are in agreement.  Consultants:  Neurology Vascular surgery  Procedures:  Right temporal artery biopsy  Antimicrobials:   Data Reviewed: I have personally reviewed following labs and imaging studies  CBC: Recent Labs  Lab 11/07/20 2046 11/07/20 2137 11/09/20 0144  WBC 6.7  --  3.3*  NEUTROABS 4.1  --   --   HGB 10.0* 10.9* 10.0*  HCT 32.2* 32.0* 31.5*  MCV 89.7  --  86.5  PLT 516*  --  457*    Basic Metabolic Panel: Recent Labs  Lab 11/07/20 2046 11/07/20 2137 11/09/20 0144  NA 134* 136 133*  K 4.3 4.3 4.0  CL 99 100 99  CO2 24  --  24  GLUCOSE 205* 207* 252*  BUN 18 19 13   CREATININE 0.81  0.80 0.63  CALCIUM 9.3  --  9.0    GFR: Estimated Creatinine Clearance: 49.6 mL/min (by C-G formula based on SCr of 0.63 mg/dL). Liver Function Tests: Recent Labs  Lab 11/07/20 2046  AST 13*  ALT 13  ALKPHOS 81  BILITOT 0.5  PROT 8.0  ALBUMIN 2.9*    No results for input(s): LIPASE, AMYLASE in the last 168 hours. No results for input(s): AMMONIA in the last 168 hours. Coagulation Profile: Recent Labs  Lab 11/07/20 2046  INR 1.1    Cardiac Enzymes: No results for input(s): CKTOTAL, CKMB, CKMBINDEX, TROPONINI in the last 168 hours. BNP (last 3 results) No results for input(s): PROBNP in the last 8760 hours. HbA1C: No results for input(s): HGBA1C in the last 72 hours.  CBG: Recent Labs  Lab 11/12/20 1651 11/12/20 2100 11/13/20 0612 11/13/20 1116 11/13/20 1611  GLUCAP 123* 190* 137* 156* 281*    Lipid Profile: No results for input(s): CHOL, HDL, LDLCALC, TRIG, CHOLHDL, LDLDIRECT in the last 72 hours.  Thyroid Function Tests: No results for input(s): TSH, T4TOTAL, FREET4, T3FREE, THYROIDAB in the last 72 hours. Anemia Panel: No results for input(s): VITAMINB12,  FOLATE, FERRITIN, TIBC, IRON, RETICCTPCT in the last 72 hours. Sepsis Labs: No results for input(s): PROCALCITON, LATICACIDVEN in the last 168 hours.  Recent Results (from the past 240 hour(s))  SARS CORONAVIRUS 2 ([name] 6-24 HRS) Nasopharyngeal Nasopharyngeal Swab     Status: None   Collection Time: 11/08/20 10:18 AM   Specimen: Nasopharyngeal Swab  Result Value Ref Range Status   SARS Coronavirus 2 NEGATIVE NEGATIVE Final    Comment: (NOTE) SARS-CoV-2 target nucleic acids are NOT DETECTED.  The SARS-CoV-2 RNA is generally detectable in upper and lower respiratory specimens during the acute phase of infection. Negative results do not preclude SARS-CoV-2 infection, do not rule out co-infections with other pathogens, and should not be used as the sole basis for treatment or other patient management decisions. Negative results must be combined with clinical observations, patient history, and epidemiological information. The expected result is Negative.  Fact Sheet for Patients: SugarRoll.be  Fact Sheet for Healthcare Providers: https://www.woods-mathews.com/  This test is not yet approved or cleared by the [place] FDA and  has been authorized for detection and/or diagnosis of SARS-CoV-2 by FDA under an Emergency Use Authorization (EUA). This EUA will remain  in effect (meaning this test can be used) for the duration of the COVID-19 declaration under Se ction 564(b)(1) of the Act, 21 U.S.C. section 360bbb-3(b)(1), unless the authorization is terminated or revoked sooner.  Performed at [facility], [place] [address], [place], [place] [address]       Radiology Studies: No results found.  Scheduled Meds:  amLODipine  10 mg Oral Daily   aspirin EC  325 mg Oral Daily   atorvastatin  40 mg Oral Daily   clopidogrel  75 mg Oral Daily   docusate sodium  100 mg Oral BID   dorzolamide  1 drop Both Eyes BID   enoxaparin (LOVENOX)  injection  40 mg Subcutaneous Daily   insulin aspart  0-15 Units Subcutaneous TID WC   insulin glargine-yfgn  10 Units Subcutaneous BID   losartan  25 mg Oral Daily   predniSONE  60 mg Oral Q breakfast   sodium chloride flush  3 mL Intravenous Q12H   Continuous Infusions:  sodium chloride 10 mL/hr at 11/09/20 0930     LOS: 5 days   Time spent: 33 minutes. More  than 50% of the time was spent in counseling/coordination of care  Lorella Nimrod, MD [facility]  If 7PM-7AM, please contact night-coverage [email or web address]  11/13/2020, 5:09 PM   This record has been created using Systems analyst. Errors have been sought and corrected,but may not always be located. Such creation errors do not reflect on the standard of care.

## 2020-11-13 NOTE — Progress Notes (Signed)
Occupational Therapy Treatment Patient Details Name: Jenna Long MRN: [id number] DOB: 03-29-1934 Today's Date: 11/13/2020   History of present illness 86 y/o female presented to ED on 9/20 for R vision loss. Was being sent to opthamologist, however office was closed prior to arrival and decided to come to ED. 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 MRI orbits revelaed abnormal enhancement along optic nerve sheaths bilaterally, compatible with bilateral optic nerve perineuritis. 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 PMH: HTN, HLD, DM   OT comments  Jenna Long is progressing well and eager to participate with therapy. Pt complete bathing ADLs at bed level per request, stating she "can reach things better" while in bed. She was min A for bed mobility, sit<>Stands and stand pivots this session. Pt endorsed transferring without assistance today, educated pt on safety, falls prevention and importance to call and wait for help. Pt continues to be limited by activity tolerance, insight into her deficits and R vision loss. She benefit from OT acutely. D/c recommendation remains appropriate.    Recommendations for follow up therapy are one component of a multi-disciplinary discharge planning process, led by the attending physician.  Recommendations may be updated based on patient status, additional functional criteria and insurance authorization.    Follow Up Recommendations  CIR;Supervision/Assistance - 24 hour    Equipment Recommendations  Other (comment)       Precautions / Restrictions Precautions Precautions: Fall;Other (comment) Precaution Comments: monitor for syncopal symptoms Restrictions Weight Bearing Restrictions: No       Mobility Bed Mobility Overal bed mobility: Needs Assistance Bed Mobility: Supine to Sit     Supine to sit: Min assist     General bed mobility  comments: Min A to adjust hips towards the EOB    Transfers Overall transfer level: Needs assistance Equipment used: Rolling walker (2 wheeled) Transfers: Sit to/from [facility] Sit to Stand: Min assist Stand pivot transfers: Min assist       General transfer comment: min A for verbal cues, steadying, balance and safey. pt does well wtih BUE supported on either BSC arms or RW    Balance Overall balance assessment: Needs assistance;History of Falls Sitting-balance support: Feet supported Sitting balance-Leahy Scale: Fair     Standing balance support: Bilateral upper extremity supported;During functional activity Standing balance-Leahy Scale: Poor                             ADL either performed or assessed with clinical judgement   ADL Overall ADL's : Needs assistance/impaired             Lower Body Bathing: Minimal assistance;Bed level Lower Body Bathing Details (indicate cue type and reason): min A at bed level; pt was wet upon arrival and requesting to wash prior to getting up         Toilet Transfer: Minimal assistance;Stand-pivot Toilet Transfer Details (indicate cue type and reason): pt declining use of a RW this session; min A for boosting and verbal cues for transfer without RW. pt had good hand placement and demonstrated goo dsafety with transfer         Functional mobility during ADLs: Minimal assistance;Rolling walker General ADL Comments: Bed level ADLs per pt's request "I can reach things better this way" - practiced toilet transfers without RW, pt pts request, and practiced with RW. pt had good hand  placement with both options wtih min A for boosting and verbal cues     Vision   Vision Assessment?: Vision impaired- to be further tested in functional context Additional Comments: Pt able to locate things in the room, incrased time for scanning. She reports no changes in her vision, continued R eye blindness   Perception      Praxis      Cognition Arousal/Alertness: Awake/alert Behavior During Therapy: WFL for tasks assessed/performed Overall Cognitive Status: No family/caregiver present to determine baseline cognitive functioning                                 General Comments: pt excited and motivated to participate in therapy. Limited insight into her deficts or safety - she endorsed getting up in her room today without assistance.        Exercises     Shoulder Instructions       General Comments VSS on RA, no synchopal symptoms this session    Pertinent Vitals/ Pain       Pain Assessment: No/denies pain   Frequency  Min 2X/week        Progress Toward Goals  OT Goals(current goals can now be found in the care plan section)  Progress towards OT goals: Progressing toward goals  Acute Rehab OT Goals Patient Stated Goal: to go home OT Goal Formulation: With patient Time For Goal Achievement: 11/25/20 Potential to Achieve Goals: Good ADL Goals Pt Will Perform Grooming: with modified independence;standing Pt Will Perform Lower Body Bathing: with modified independence;sit to/from stand Pt Will Perform Lower Body Dressing: with modified independence;sit to/from stand Pt Will Transfer to Toilet: with modified independence;ambulating Additional ADL Goal #1: Pt to demonstrate at least 2 low vision strategies to implement during daily routine  Plan Discharge plan remains appropriate    Co-evaluation                 AM-PAC OT "6 Clicks" Daily Activity     Outcome Measure   Help from another person eating meals?: A Little Help from another person taking care of personal grooming?: A Little Help from another person toileting, which includes using toliet, bedpan, or urinal?: A Lot Help from another person bathing (including washing, rinsing, drying)?: A Lot Help from another person to put on and taking off regular upper body clothing?: A Little Help from another  person to put on and taking off regular lower body clothing?: A Lot 6 Click Score: 15    End of Session Equipment Utilized During Treatment: Gait belt;Rolling walker  OT Visit Diagnosis: Unsteadiness on feet (R26.81);Other abnormalities of gait and mobility (R26.89);Muscle weakness (generalized) (M62.81);History of falling (Z91.81)   Activity Tolerance Patient tolerated treatment well   Patient Left in chair;with call bell/phone within reach   Nurse Communication Mobility status        Time: 8250-5397 OT Time Calculation (min): 17 min  Charges: OT General Charges $OT Visit: 1 Visit OT Treatments $Self Care/Home Management : 8-22 mins    Bralyn Folkert A Jaymes Hang 11/13/2020, 4:25 PM

## 2020-11-14 ENCOUNTER — Inpatient Hospital Stay (HOSPITAL_COMMUNITY): Payer: PPO

## 2020-11-14 DIAGNOSIS — H5461 Unqualified visual loss, right eye, normal vision left eye: Secondary | ICD-10-CM | POA: Diagnosis not present

## 2020-11-14 DIAGNOSIS — M316 Other giant cell arteritis: Secondary | ICD-10-CM | POA: Diagnosis not present

## 2020-11-14 DIAGNOSIS — E119 Type 2 diabetes mellitus without complications: Secondary | ICD-10-CM | POA: Diagnosis not present

## 2020-11-14 DIAGNOSIS — I10 Essential (primary) hypertension: Secondary | ICD-10-CM | POA: Diagnosis not present

## 2020-11-14 LAB — GLUCOSE, CAPILLARY
Glucose-Capillary: 106 mg/dL — ABNORMAL HIGH (ref 70–99)
Glucose-Capillary: 246 mg/dL — ABNORMAL HIGH (ref 70–99)
Glucose-Capillary: 322 mg/dL — ABNORMAL HIGH (ref 70–99)
Glucose-Capillary: 237 mg/dL — ABNORMAL HIGH (ref 70–99)

## 2020-11-14 LAB — SURGICAL PATHOLOGY

## 2020-11-14 IMAGING — CT CT ANGIO HEAD-NECK (W OR W/O PERF)
1 of 11 series · 14 of 47 positions shown · IV contrast (omnipaque)
Comparison: [DATE]

CLINICAL DATA: Syncope, recurrent

EXAM:
CT ANGIOGRAPHY HEAD AND NECK
TECHNIQUE: Multidetector CT imaging of the head and neck was performed using
the standard protocol during bolus administration of intravenous
contrast. Multiplanar CT image reconstructions and MIPs were
obtained to evaluate the vascular anatomy. Carotid stenosis
measurements (when applicable) are obtained utilizing NASCET
criteria, using the distal internal carotid diameter as the
denominator.
CONTRAST:  50mL OMNIPAQUE IOHEXOL 350 MG/ML SOLN

[Series 8: thin · axial · 0.45mm/px · z∈[-283,+5]mm · 14 of 665 slices shown]
[im 45/665  brain]
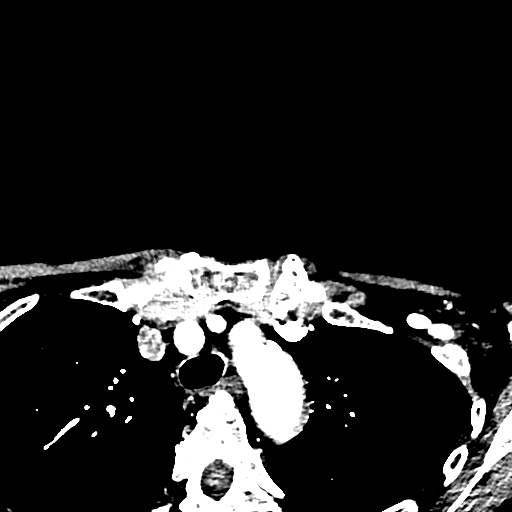
[im 89/665  bone]
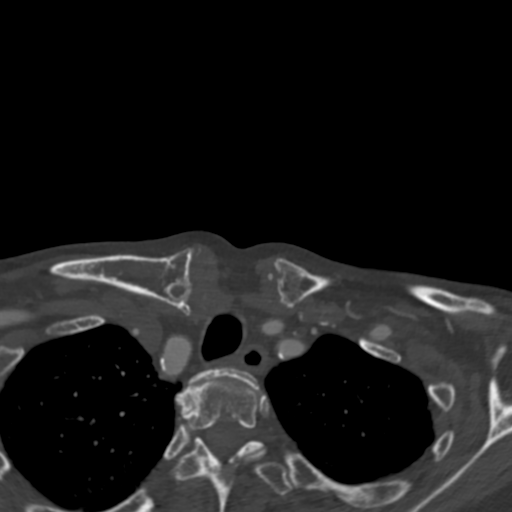
[im 133/665  brain]
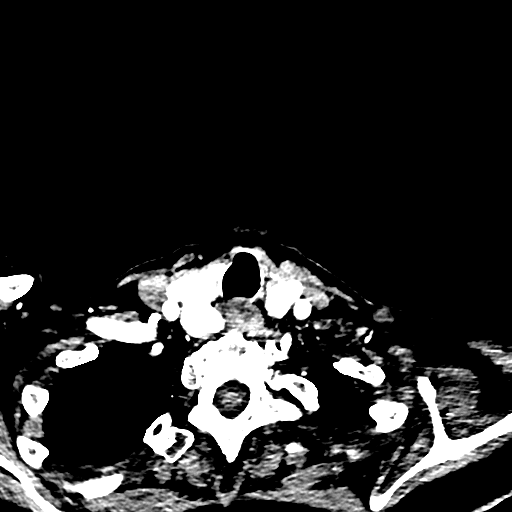
[im 178/665  bone]
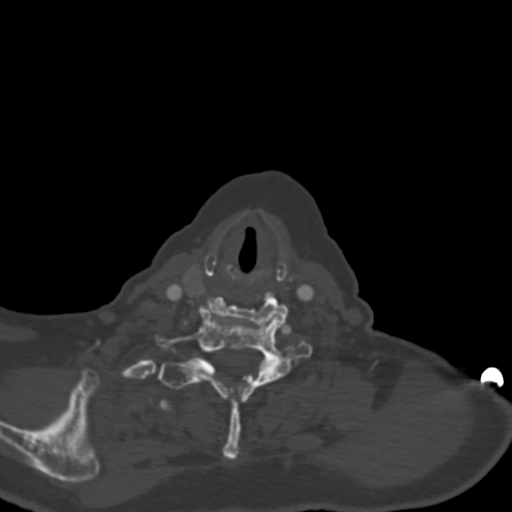
[im 222/665  brain]
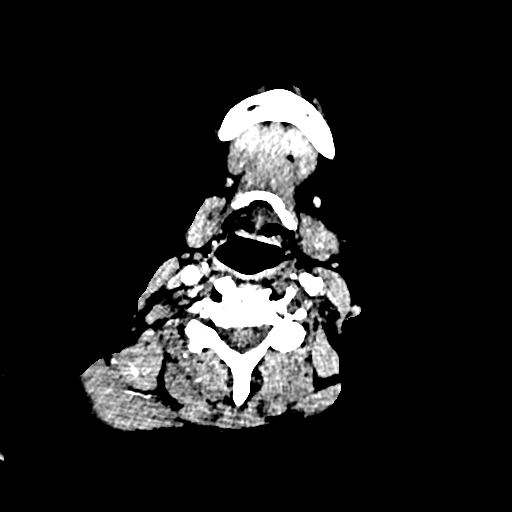
[im 266/665  bone]
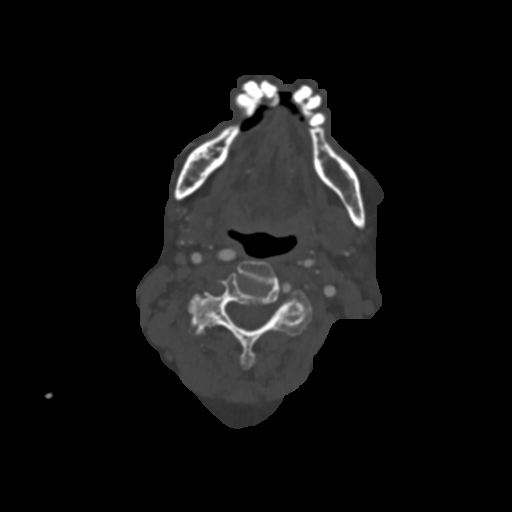
[im 310/665  brain]
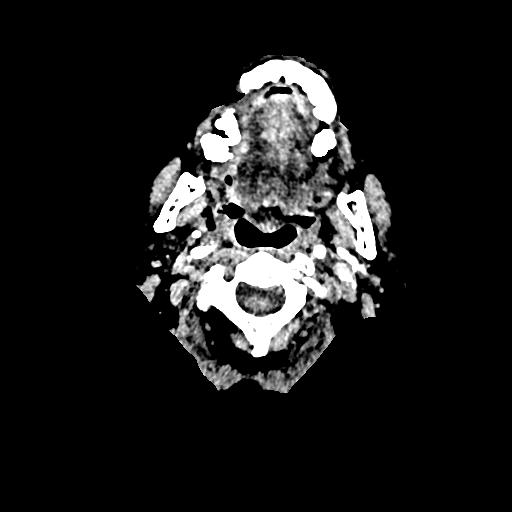
[im 355/665  bone]
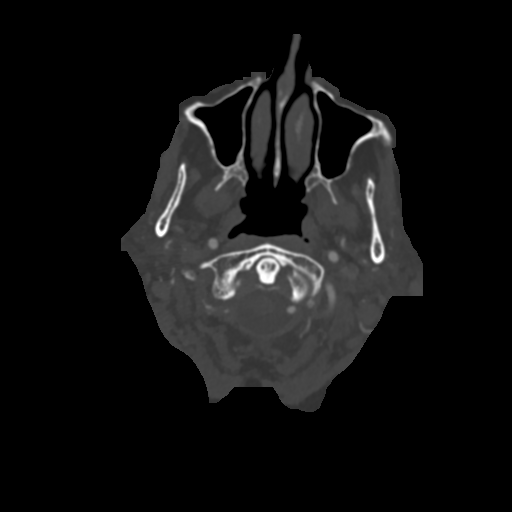
[im 399/665  brain]
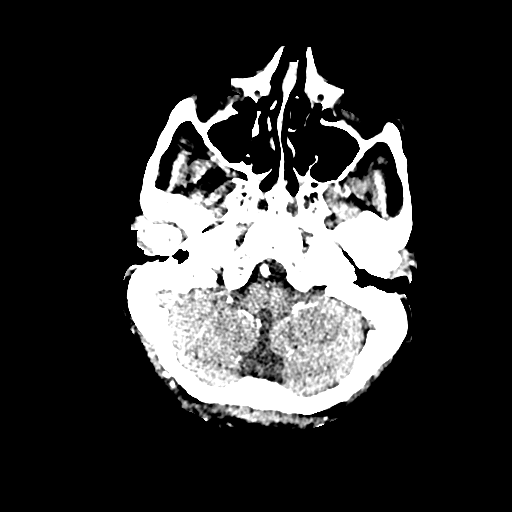
[im 443/665  bone]
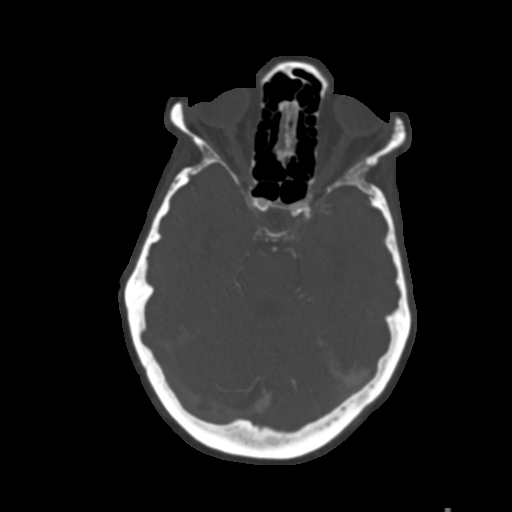
[im 487/665  brain]
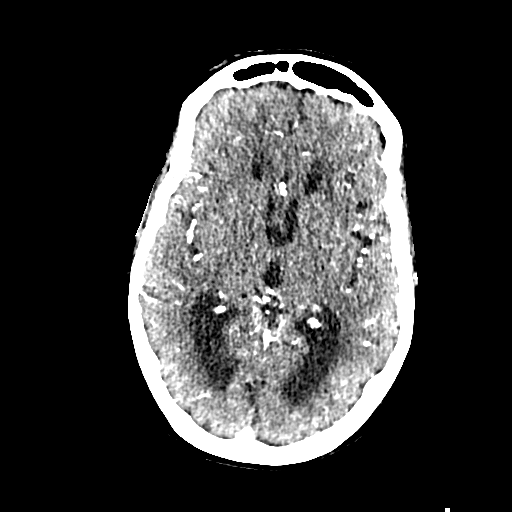
[im 532/665  bone]
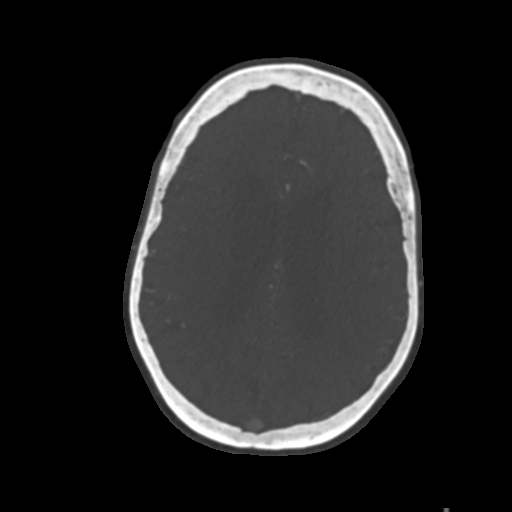
[im 576/665  brain]
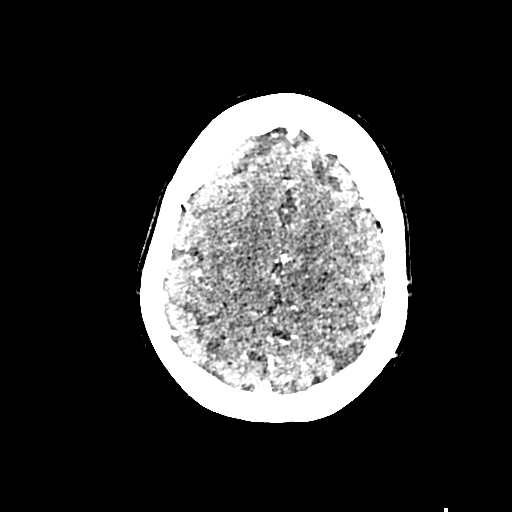
[im 620/665  bone]
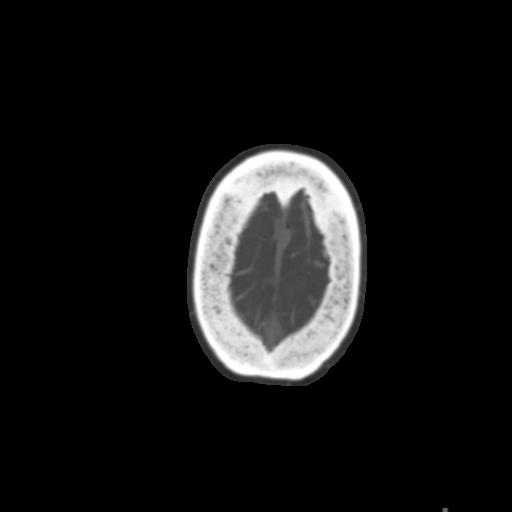

[14 of 47 positions shown; findings below may reference images not displayed]

FINDINGS: CT HEAD

Brain: There is no acute intracranial hemorrhage, mass effect, or
edema. Gray-white differentiation is preserved. There is no
extra-axial fluid collection. Ventricles and sulci are stable in
size and configuration. Stable findings of chronic microvascular
ischemic changes in the cerebral white matter.

Vascular: No hyperdense vessel.

Skull: Calvarium is unremarkable.

Sinuses/Orbits: No acute finding.

Other: None.

Review of the MIP images confirms the above findings

CTA NECK

Aortic arch: Stable appearance.  Great vessel origins are patent.

Right carotid system: Stable appearance. Minimal calcified plaque at
the ICA origin. No stenosis. Partially retropharyngeal course.

Left carotid system: Stable. Calcified plaque along the distal
common carotid and proximal internal carotid with less than 50%
stenosis.

Vertebral arteries: Stable appearance of patent left vertebral
artery with calcified plaque causing stenosis at the origin.
Multifocal severe stenosis of the right vertebral artery with
occlusion at the C2-C3 level. Decreased reconstitution of proximal
right V3 segment.

Skeleton: Stable appearance.

Other neck: No new findings.

Upper chest: No new findings.

Review of the MIP images confirms the above findings

CTA HEAD

Anterior circulation: Intracranial internal carotid arteries are
patent with calcified plaque causing similar mild narrowing, left
greater than right. Anterior and middle cerebral arteries are
patent. Anterior and middle cerebral arteries are patent. Stable
appearance of bilateral stenoses.

Posterior circulation: Stable appearance of partially opacified and
irregular appearing intracranial right vertebral artery. Stable
appearance of patent left vertebral artery and plaque. Stable
appearance of basilar artery with areas of stenosis. Stable
appearance of posterior cerebral arteries with areas of stenosis.

Venous sinuses: Patent as allowed by contrast bolus timing.

Review of the MIP images confirms the above findings
IMPRESSION: No acute intracranial abnormality. No substantial change in
appearance of vascular imaging compared to recent prior study with
multifocal atherosclerosis and stenoses of anterior and posterior
circulations.

## 2020-11-14 MED ORDER — AMLODIPINE BESYLATE 5 MG PO TABS
5.0000 mg | ORAL_TABLET | Freq: Every day | ORAL | Status: DC
  Administered 2020-11-15 – 2020-11-18 (×4): 5 mg via ORAL
  Filled 2020-11-14 (×4): qty 1

## 2020-11-14 MED ORDER — IOHEXOL 350 MG/ML SOLN
50.0000 mL | Freq: Once | INTRAVENOUS | Status: AC | PRN
  Administered 2020-11-14: 50 mL via INTRAVENOUS

## 2020-11-14 MED ORDER — ASPIRIN EC 81 MG PO TBEC
81.0000 mg | DELAYED_RELEASE_TABLET | Freq: Every day | ORAL | Status: DC
  Administered 2020-11-15 – 2020-11-18 (×4): 81 mg via ORAL
  Filled 2020-11-14 (×4): qty 1

## 2020-11-14 NOTE — Progress Notes (Signed)
STROKE TEAM PROGRESS NOTE   INTERVAL HISTORY Her daughter is  at bedside.  Stroke team was asked to reevaluate patient since she had an episode of transient blurred vision.  Patient states this happened this morning when she had trouble looking at the calendar.  Right eye is remained blind without any improvement or even light perception.  History of surgery left eye which became blurred.  She had orthostatic hypotension with systolic blood pressure dropping to 97 systolic when she had this episode of generalized weakness with syncope today.  Blood pressure is now in the 016-553 systolic range.  Patient lying in bed, no acute distress.  Still has right eye vision total loss.  Per Dr. Reesa Chew, patient had presyncope episode while washing her self in the sink earlier today.  Now back to bed, at baseline.  Solu-Medrol 3-day course completed yesterday, will start prednisone 60 mg daily today.  Still has hyperglycemia. Patient unfortunately has had no improvement in the right eye visual loss despite getting 3 days of IV Solu-Medrol and 60 mg of prednisone daily.  Temporal artery biopsy results are yet pending but temporal artery ultrasound had suggested halo sign on the right temporal artery.. Vitals:   11/14/20 0418 11/14/20 0752 11/14/20 1128 11/14/20 1235  BP: (!) 148/111 (!) 148/73 (!) 139/59 (!) 145/69  Pulse: 67 78 83 78  Resp: 17 18 18 20   Temp: 97.6 F (36.4 C) (!) 97.3 F (36.3 C) (!) 97.5 F (36.4 C)   TempSrc: Oral Oral Oral   SpO2: 99% 99% 98% 96%  Weight:      Height:       CBC:  Recent Labs  Lab 11/07/20 2046 11/07/20 2137 11/09/20 0144  WBC 6.7  --  3.3*  NEUTROABS 4.1  --   --   HGB 10.0* 10.9* 10.0*  HCT 32.2* 32.0* 31.5*  MCV 89.7  --  86.5  PLT 516*  --  748*   Basic Metabolic Panel:  Recent Labs  Lab 11/07/20 2046 11/07/20 2137 11/09/20 0144  NA 134* 136 133*  K 4.3 4.3 4.0  CL 99 100 99  CO2 24  --  24  GLUCOSE 205* 207* 252*  BUN 18 19 13   CREATININE 0.81  0.80 0.63  CALCIUM 9.3  --  9.0   Lipid Panel:  Recent Labs  Lab 11/09/20 0144  CHOL 165  TRIG 96  HDL 46  CHOLHDL 3.6  VLDL 19  LDLCALC 100*   HgbA1c:  Recent Labs  Lab 11/08/20 0246  HGBA1C 6.3*    IMAGING past 24 hours No results found.  PHYSICAL EXAM  Temp:  [97.3 F (36.3 C)-97.8 F (36.6 C)] 97.5 F (36.4 C) (09/26 1128) Pulse Rate:  [67-97] 78 (09/26 1235) Resp:  [16-20] 20 (09/26 1235) BP: (131-148)/(59-111) 145/69 (09/26 1235) SpO2:  [96 %-99 %] 96 % (09/26 1235)  General - Well nourished, well developed, in no apparent distress.  Ophthalmologic - fundi not visualized due to noncooperation.  Cardiovascular - Regular rhythm and rate.  Neuro - patient awake alert fully orientated, however, right eye complete vision loss.  No aphasia, fluent language, follows simple commands, able to name and repeat.  Left eye no visual field deficit, no gaze palsy, facial symmetrical, tongue midline.  Bilateral upper and lower extremity equal strengths, sensation symmetrical, finger-to-nose intact.  However left heel-to-shin dysmetria.     ASSESSMENT/PLAN Ms. ABBEGAYLE DENAULT is a 86 y.o. female with history of diabetes, hypertension, hyperlipidemia admitted for headache,  vision loss, lethargy.  Per daughter and the patient, 2-3 weeks ago patient had inner ear infection and headache.  1 week ago inner ear infection but still has continued headache started to feel right eye blurry vision.  5 days ago, patient went to see her PCP and found to have right complete vision loss.  Also per daughter, for the last weeks, patient lethargic, not eating well, sleeping all day, not moving around, sometimes confused.  Patient denies any jaw pain, fever, diplopia or shoulder pain.  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 The right superficial temporal artery is noncompressible.  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  CT head . Small inferior right cerebellar infarcts better seen on same-day MRI. 2. No evidence of interval acute abnormality. CTA head & neck 1. Severe stenosis of the mid and distal basilar artery and bilateral P2 PCAs.2. Severe stenosis of the distal M1 and proximal M2 MCA branches bilaterally. 3. Severe stenosis of the left A2 ACA. 4. Multifocal severe stenosis of the right vertebral artery in the neck with occlusion at the C2-C3 level and irregular opacification of a small V3/V4 vertebral artery from collaterals. 5. Moderate stenosis of the intradural left vertebral artery. 6. Bilateral carotid bifurcation atherosclerosis without greater than 50% stenosis. MRI  Two subcentimeter acute infarcts within the inferior right cerebellar hemisphere., Chronic lacunar infarcts within the left cerebral hemispheric white matter, bilateral basal ganglia, and bilateral thalami. 2D Echo EF 70 to 75% LDL 100 HgbA1c 6.3 VTE prophylaxis - lovenox No antithrombotic prior to admission, now on aspirin 81 mg daily and clopidogrel 75 mg daily.  Continue ASA and Plavix for 3 weeks and then ASA alone.  Therapy recommendations: CIR but pt refused will do Home health PT/OT Disposition: Home  Presyncope 9/23 in the morning while washing herself at the  sink Likely orthostatic hypotension Recommend IV fluid TED hose Encourage p.o. intake Repeat orthostatic vitals in p.m.  Hypertension Home meds:  amlodipine Stable Long-term BP goal normotensive  Hyperlipidemia Home meds:  pravastatin 40 mg daily LDL 100, goal < 70 Changed to Lipitor 40 Continue statin at discharge  Diabetes type II Controlled Home meds:  januvia, metformin HgbA1c 6.3, goal < 7.0 CBGs Hyperglycemia due to steroid use SSI Close PT follow-up for better DM control in the setting of steroid use  Other Stroke Risk Factors Advanced Age >/= 46   Other Active Problems   Hospital day # 6 Patient clearly had orthostatic presyncopal symptoms.  Recommend permissive hypertension with systolic at least 448 and above given her multifocal intra and extracranial vascular stenosis.  Follow temporal artery biopsy results and if negative start tapering prednisone.  Recommend ophthalmology consult as an outpatient follow him ischemic optic neuropathy.  Continue aspirin Plavix for 3 weeks and then Plavix alone and aggressive risk factor modification.  Greater than 50% time during this 31-minute visit  was spent on counseling and coordination of care about her vision loss and multileaf vessel intracranial stenosis and answering questions.  Discussed with Dr. Reesa Chew Neurology will sign off. Please call with questions. Pt will follow up with stroke clinic in 8 weeks. Thanks for the consult.  Antony Contras MD  stroke Neurology 11/14/2020 1:48 PM    To contact Stroke Continuity provider, please refer to [email or web address] After hours, contact General Neurology

## 2020-11-14 NOTE — Progress Notes (Signed)
Inpatient Rehab Admissions Coordinator:   I do not have a bed for this Pt.on CIR today. I will send case to insurance once Pt. Works with therapy today.   Clemens Catholic, [place], [place] Admissions Coordinator  [phone] ([place]) [phone] (office)

## 2020-11-14 NOTE — Progress Notes (Signed)
PT reported pt experienced syncope while working with her. Pt was put back to the bed. VSS. Pt reported L vision come and go. MD notified. New orders received.   Lavenia Atlas, RN

## 2020-11-14 NOTE — Progress Notes (Addendum)
PROGRESS NOTE    Jenna Long  OBS:[id number] DOB: Mar 27, 1934 DOA: 11/07/2020 PCP: Sharilyn Sites, MD   Brief Narrative: Taken from H&P. Jenna Long is a 86 y.o. female with medical history significant of HTN; HLD; and DM presenting with R eye vision loss. She has had inner ear and sinus problems.  The inner ear went away but the sinus stopped her head up.  When it started again, she noticed a film over her R eye.  She called Dr. Rosana Hoes and made an appointment for Monday at 3:15.  She was concerned that she had lost her eyesight.  Dr. Rosana Hoes understood what was going on and called Dr. Katy Fitch for an appointment at 5pm  They were late for the appointment and so they came to the ER.  It has not changed in the ER.  The film has gone but the vision is completely gone -0 can not even see light.  No headache but she has had temporal discomfort for about 2 weeks since her sinuses got stopped up.  She gets pulsating pains in her R temporal region and goes to lie down until they improve. Elevated inflammatory markers.  COVID-19 negative.  MRI with new small right cerebellar infarct. 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  Concern of giant cell arteritis-patient underwent right temporal artery biopsy on 11/09/2020.  She was also started on high-dose steroid for 3 days-completed a course today. Temporal artery duplex with Halo sign on right which is consistent with temporal arteritis.  No sign on left.  Still no vision on right-can be a permanent vision loss as symptoms started approximately 2 weeks ago.  She will be started on prednisone at 1 mg/kg/day from tomorrow and will need a very slow taper, like 50 mg after 2 weeks, decreasing 10 mg every 2 weeks.  Patient will also need outpatient neurology and rheumatology appointments for further management.  9/24: Patient decided to proceed with CIR-medically stable awaiting CIR evaluation, bed availability and insurance authorization.  9/26: Patient  developed another syncopal episode while working with PT in the early afternoon. There was some reported transient loss of vision in the left eye which prompted reevaluation by neurology.  Patient had multiple intra and extracranial vascular stenosis and will remain high risk for stroke.  Her blood pressure noted during that episode was around 476 systolic, normal blood pressure remained around 130s, can be a orthostatic, per neurology patient should allow mild permissive hypertension to keep the perfusion to the brain within acceptable range.  Decrease the dose of amlodipine from 10-5.  We will keep monitoring. She might have ischemic right optic neuropathy although inflammatory markers were more concerning for temporal arteritis, has not resume vision yet despite giving high-dose steroid. Biopsy results still pending.  Addendum: Biopsy results came back positive for temporal arteritis.  Patient will need rheumatology consult on discharge.  Subjective: Patient was resting comfortably when seen during morning rounds.  She was feeling hungry and has not received her breakfast tray yet which got delayed for unknown reason.  Later paged by nursing staff that patient syncopized while working with PT. there was also some transient loss of vision in the left eye.  Assessment & Plan:   Principal Problem:   Temporal arteritis ([place]) Active Problems:   Essential hypertension   Mixed hyperlipidemia   Diabetes mellitus type 2 in nonobese (HCC)   DNR (do not resuscitate)   Cerebral thrombosis with cerebral infarction  Right vision loss.  Patient also  had subacute temporal pain.  Recent history of sinus congestion.  Elevated CRP.  Concern of giant cell arteritis/temporal arteritis versus ischemic optic neuropathy. Vascular surgery was consulted and patient underwent successful temporal artery biopsy on 9/21, pending pathology results. 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  2 small acute  infarcts involving the inferior right cerebellar hemisphere.  Chronic lacunar infarcts. Arterial duplex of temporal arteries positive for halo sign on right which is consistent with temporal arteritis.  Completed a 3-day course of high-dose steroid. -Start her on 60 mg of prednisone daily, she was started on 11/11/2020-she will need a very slow taper over the course of many weeks in the future.  Decreased 10 mg every 2-week.  She will also need a close follow-up by PCP and vascular surgery.  She will also need follow-up with rheumatology and neurology. Most likely a permanent right sided vision loss. -Follow-up temporal artery biopsy results. -Continue Trusopt in B eyes for now (glaucoma) -Patient will need outpatient ophthalmology evaluation.  Recurrent syncope.  Might be vasovagal but patient do have multiple severe stenosis involving both intra and extracranial vessels.  Small decrease in blood pressure can decrease the perfusion of brain which can become symptomatic.  When I reevaluated her her vision in the left eye has been restored. No other focal deficit.  Still no vision in right eye. -Neurology was reconsulted -We will repeat imaging to rule out any new deficit. -PT is recommending CIR-pending insurance authorization  Acute small inferior right cerebellar infarcts.  CTA with moderate to severe stenosis involving multiple vessels which include basilar, bilateral P2 PCAs.  Severe stenosis of M1 and M2 of MCA bilaterally.  Severe stenosis of left ACA.  Severe stenosis of right vertebral artery and moderate stenosis of left vertebral artery.  Moderate stenosis of carotid bifurcation.   Lipid panel with LDL of 100, goal of less than 70. -Continue with statin  Type 2 diabetes mellitus.  On Glucophage and Januvia at home.  Well-controlled with A1c of 6.3. CBC elevated as she is on steroid. -Increase long-acting to 10 units twice daily. -Continue with SSI  Hypertension.  Blood pressure  improving after addition of losartan.  Due to multiple intra and extracranial vascular stenosis she is very sensitive to loud blood pressure, per neurology which should allow mild permissive hypertension all the time.  Keep systolic around 599 might be helpful. -Decrease the dose of amlodipine to 5 mg daily. -Continue losartan and monitor.  Objective: Vitals:   11/14/20 0418 11/14/20 0752 11/14/20 1128 11/14/20 1235  BP: (!) 148/111 (!) 148/73 (!) 139/59 (!) 145/69  Pulse: 67 78 83 78  Resp: 17 18 18 20   Temp: 97.6 F (36.4 C) (!) 97.3 F (36.3 C) (!) 97.5 F (36.4 C)   TempSrc: Oral Oral Oral   SpO2: 99% 99% 98% 96%  Weight:      Height:        Intake/Output Summary (Last 24 hours) at 11/14/2020 1403 Last data filed at 11/14/2020 1131 Gross per 24 hour  Intake 120 ml  Output 625 ml  Net -505 ml    Filed Weights   11/08/20 2033 11/08/20 2304 11/09/20 0928  Weight: 68.5 kg 60 kg 60 kg    Examination:  General.  Frail elderly lady, in no acute distress. Pulmonary.  Lungs clear bilaterally, normal respiratory effort. CV.  Regular rate and rhythm, no JVD, rub or murmur. Abdomen.  Soft, nontender, nondistended, BS positive. CNS.  Alert and oriented .  No  focal neurologic deficit.  Right-sided blindness. Extremities.  No edema, no cyanosis, pulses intact and symmetrical. Psychiatry.  Judgment and insight appears normal.   DVT prophylaxis: Lovenox Code Status: DNR Family Communication: Discussed with daughter at bedside. Disposition Plan:  Status is: Inpatient  Remains inpatient appropriate because:Inpatient level of care appropriate due to severity of illness  Dispo: The patient is from: Home              Anticipated d/c is to: CIR              Patient currently is medically stable   Difficult to place patient No              Level of care: Telemetry Medical  All the records are reviewed and case discussed with Care Management/Social Worker. Management plans  discussed with the patient, nursing and they are in agreement.  Consultants:  Neurology Vascular surgery  Procedures:  Right temporal artery biopsy  Antimicrobials:   Data Reviewed: I have personally reviewed following labs and imaging studies  CBC: Recent Labs  Lab 11/07/20 2046 11/07/20 2137 11/09/20 0144  WBC 6.7  --  3.3*  NEUTROABS 4.1  --   --   HGB 10.0* 10.9* 10.0*  HCT 32.2* 32.0* 31.5*  MCV 89.7  --  86.5  PLT 516*  --  457*    Basic Metabolic Panel: Recent Labs  Lab 11/07/20 2046 11/07/20 2137 11/09/20 0144  NA 134* 136 133*  K 4.3 4.3 4.0  CL 99 100 99  CO2 24  --  24  GLUCOSE 205* 207* 252*  BUN 18 19 13   CREATININE 0.81 0.80 0.63  CALCIUM 9.3  --  9.0    GFR: Estimated Creatinine Clearance: 49.6 mL/min (by C-G formula based on SCr of 0.63 mg/dL). Liver Function Tests: Recent Labs  Lab 11/07/20 2046  AST 13*  ALT 13  ALKPHOS 81  BILITOT 0.5  PROT 8.0  ALBUMIN 2.9*    No results for input(s): LIPASE, AMYLASE in the last 168 hours. No results for input(s): AMMONIA in the last 168 hours. Coagulation Profile: Recent Labs  Lab 11/07/20 2046  INR 1.1    Cardiac Enzymes: No results for input(s): CKTOTAL, CKMB, CKMBINDEX, TROPONINI in the last 168 hours. BNP (last 3 results) No results for input(s): PROBNP in the last 8760 hours. HbA1C: No results for input(s): HGBA1C in the last 72 hours.  CBG: Recent Labs  Lab 11/13/20 1116 11/13/20 1611 11/13/20 2015 11/14/20 0636 11/14/20 1125  GLUCAP 156* 281* 183* 106* 246*    Lipid Profile: No results for input(s): CHOL, HDL, LDLCALC, TRIG, CHOLHDL, LDLDIRECT in the last 72 hours.  Thyroid Function Tests: No results for input(s): TSH, T4TOTAL, FREET4, T3FREE, THYROIDAB in the last 72 hours. Anemia Panel: No results for input(s): VITAMINB12, FOLATE, FERRITIN, TIBC, IRON, RETICCTPCT in the last 72 hours. Sepsis Labs: No results for input(s): PROCALCITON, LATICACIDVEN in the last 168  hours.  Recent Results (from the past 240 hour(s))  SARS CORONAVIRUS 2 ([name] 6-24 HRS) Nasopharyngeal Nasopharyngeal Swab     Status: None   Collection Time: 11/08/20 10:18 AM   Specimen: Nasopharyngeal Swab  Result Value Ref Range Status   SARS Coronavirus 2 NEGATIVE NEGATIVE Final    Comment: (NOTE) SARS-CoV-2 target nucleic acids are NOT DETECTED.  The SARS-CoV-2 RNA is generally detectable in upper and lower respiratory specimens during the acute phase of infection. Negative results do not preclude SARS-CoV-2 infection, do not rule out co-infections  with other pathogens, and should not be used as the sole basis for treatment or other patient management decisions. Negative results must be combined with clinical observations, patient history, and epidemiological information. The expected result is Negative.  Fact Sheet for Patients: SugarRoll.be  Fact Sheet for Healthcare Providers: https://www.woods-mathews.com/  This test is not yet approved or cleared by the [place] FDA and  has been authorized for detection and/or diagnosis of SARS-CoV-2 by FDA under an Emergency Use Authorization (EUA). This EUA will remain  in effect (meaning this test can be used) for the duration of the COVID-19 declaration under Se ction 564(b)(1) of the Act, 21 U.S.C. section 360bbb-3(b)(1), unless the authorization is terminated or revoked sooner.  Performed at [facility], [place] [address] [place], [place] [address]       Radiology Studies: No results found.  Scheduled Meds:  [START ON 11/15/2020] amLODipine  5 mg Oral Daily   [START ON 11/15/2020] aspirin EC  81 mg Oral Daily   atorvastatin  40 mg Oral Daily   clopidogrel  75 mg Oral Daily   docusate sodium  100 mg Oral BID   dorzolamide  1 drop Both Eyes BID   enoxaparin (LOVENOX) injection  40 mg Subcutaneous Daily   insulin aspart  0-15 Units Subcutaneous TID WC   insulin  glargine-yfgn  10 Units Subcutaneous BID   losartan  25 mg Oral Daily   predniSONE  60 mg Oral Q breakfast   sodium chloride flush  3 mL Intravenous Q12H   Continuous Infusions:  sodium chloride 10 mL/hr at 11/09/20 0930     LOS: 6 days   Time spent: 45 minutes. More than 50% of the time was spent in counseling/coordination of care  Lorella Nimrod, MD [facility]  If 7PM-7AM, please contact night-coverage [email or web address]  11/14/2020, 2:03 PM   This record has been created using Systems analyst. Errors have been sought and corrected,but may not always be located. Such creation errors do not reflect on the standard of care.

## 2020-11-14 NOTE — Progress Notes (Signed)
Physical Therapy Treatment Patient Details Name: Jenna Long MRN: [id number] DOB: [birth year] Today's Date: 11/14/2020   History of Present Illness Pt is 86 y/o female presented to ED on 9/20 for R vision loss. Was being sent to opthamologist, however office was closed prior to arrival and decided to come to ED. 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 MRI orbits revelaed abnormal enhancement along optic nerve sheaths bilaterally, compatible with bilateral optic nerve perineuritis. 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 PMH: HTN, HLD, DM    PT Comments    Pt limited today due to syncope.    At arrival, pt had reports of L eye vision coming and going over the past 2 days.  States it is good now.  Pt also very inconsistent in these reports despite re-phrasing and clarifying questions.  Pt was able to identify date on calendar and number of fingers when PT tested.  She then transitioned to recliner via stand pivot and ate her soup and yogart without assist.  After eating, pt felt like attempting walk.  She walked 5' and stated "I can't do it, I'm weak."  Sat in chair (BP was 131/86 and HR 130 when walking).  After a few mins pt reports feeling better and wanting to try again.  Had her stand for BP and BP then was 100/61 and reported mild symptoms.  Had her try weight shifting to try to stay standing for 3 min BP but she stated felt better and started walking.  Again after 5', "I can't do it, I'm going to fall out".  Sat in chair BP was 97/53.  Pt reports nausea and pain behind R eye, gave emesis bag, and  Slid chair next to bed for stand pivot back.  Once chair at EOB, pushed button for RN assist, and pt then completely unresponsive with eyes open/rolled back.  Total assist x 2 pivot back to bed/supine (daughter assisted).  Pt immediately arousing.  Likely unresponsive only for 10-15 seconds.  Nursing into assist/assess. Bps in supine 127/102, 164/102, switched to R arm 147/91. RN paged MD regarding reports of L eye vision coming and going and syncope.  Pt motivated to work with PT , was just limited due to syncope and orthostatic hypotension.  Pt also with inconsistent reports in regards to vision (R eye still blind, states L eye comes and goes but inconsistent testing).  Pt with poor safety awareness in regards to her syncopal symptoms.   Will continue to benefit from PT.    Recommendations for follow up therapy are one component of a multi-disciplinary discharge planning process, led by the attending physician.  Recommendations may be updated based on patient status, additional functional criteria and insurance authorization.  Follow Up Recommendations  CIR     Equipment Recommendations  Rolling walker with 5" wheels    Recommendations for Other Services Rehab consult     Precautions / Restrictions Precautions Precautions: Fall;Other (comment) Precaution Comments: Orthostatic - syncopal     Mobility  Bed Mobility Overal bed mobility: Needs Assistance Bed Mobility: Supine to Sit;Sit to Supine     Supine to sit: Min assist Sit to supine: Total assist   General bed mobility comments: Increased time and cues to sit; Total assist back to bed due to syncope    Transfers Overall transfer level: Needs assistance Equipment used: Rolling walker (2 wheeled);2 person hand held assist Transfers: Sit to/from  Stand;Stand Pivot Transfers Sit to Stand: Min assist;+2 safety/equipment Stand pivot transfers: Min assist;+2 safety/equipment       General transfer comment: Sit to stand x 3 with min A.  Initial stand pivot to recliner with min A of 2 and HHA.  Final stand pivot Total due to syncope  Ambulation/Gait Ambulation/Gait assistance: +2 safety/equipment;Mod assist Gait Distance (Feet): 5 Feet (5'x2) Assistive device: Rolling walker (2 wheeled) Gait Pattern/deviations:  Step-to pattern;Decreased stride length;Shuffle Gait velocity: decreased   General Gait Details: cues for direction, safety, and assist to balance; chair follow   Stairs             Wheelchair Mobility    Modified Rankin (Stroke Patients Only) Modified Rankin (Stroke Patients Only) Pre-Morbid Rankin Score: Slight disability Modified Rankin: Moderately severe disability     Balance Overall balance assessment: Needs assistance;History of Falls Sitting-balance support: Feet supported;No upper extremity supported Sitting balance-Leahy Scale: Fair     Standing balance support: Bilateral upper extremity supported;During functional activity Standing balance-Leahy Scale: Poor Standing balance comment: requiring RW                            Cognition Arousal/Alertness: Awake/alert Behavior During Therapy: WFL for tasks assessed/performed Overall Cognitive Status: Impaired/Different from baseline Area of Impairment: Safety/judgement;Problem solving                         Safety/Judgement: Decreased awareness of safety;Decreased awareness of deficits Awareness: Emergent Problem Solving: Difficulty sequencing;Slow processing        Exercises      General Comments General comments (skin integrity, edema, etc.): Pt with syncope (see assessment)      Pertinent Vitals/Pain Pain Assessment: Faces Faces Pain Scale: Hurts little more Pain Location: "behind R eye" Pain Descriptors / Indicators: Discomfort Pain Intervention(s): Monitored during session    Home Living                      Prior Function            PT Goals (current goals can now be found in the care plan section) Progress towards PT goals: Not progressing toward goals - comment (limited due to syncope)    Frequency    Min 4X/week      PT Plan Current plan remains appropriate    Co-evaluation              AM-PAC PT "6 Clicks" Mobility   Outcome  Measure  Help needed turning from your back to your side while in a flat bed without using bedrails?: A Little Help needed moving from lying on your back to sitting on the side of a flat bed without using bedrails?: A Little Help needed moving to and from a bed to a chair (including a wheelchair)?: A Little Help needed standing up from a chair using your arms (e.g., wheelchair or bedside chair)?: A Little Help needed to walk in hospital room?: A Lot Help needed climbing 3-5 steps with a railing? : A Lot 6 Click Score: 16    End of Session Equipment Utilized During Treatment: Gait belt Activity Tolerance: Treatment limited secondary to medical complications (Comment) (orthostatic) Patient left: in bed;with call bell/phone within reach;with family/visitor present;with bed alarm set Nurse Communication: Mobility status PT Visit Diagnosis: Unsteadiness on feet (R26.81);Muscle weakness (generalized) (M62.81);Difficulty in walking, not elsewhere classified (R26.2)     Time: 4401-0272 PT  Time Calculation (min) (ACUTE ONLY): 47 min  Charges:  $Gait Training: 8-22 mins $Therapeutic Activity: 23-37 mins                     Abran Richard, PT Acute Rehab Services Pager [phone] [facility] [place] 11/14/2020, 12:42 PM

## 2020-11-15 DIAGNOSIS — H5461 Unqualified visual loss, right eye, normal vision left eye: Secondary | ICD-10-CM | POA: Diagnosis not present

## 2020-11-15 DIAGNOSIS — M316 Other giant cell arteritis: Secondary | ICD-10-CM | POA: Diagnosis not present

## 2020-11-15 DIAGNOSIS — I10 Essential (primary) hypertension: Secondary | ICD-10-CM | POA: Diagnosis not present

## 2020-11-15 DIAGNOSIS — E119 Type 2 diabetes mellitus without complications: Secondary | ICD-10-CM | POA: Diagnosis not present

## 2020-11-15 LAB — CBC WITH DIFFERENTIAL/PLATELET
Abs Immature Granulocytes: 0.06 10*3/uL (ref 0.00–0.07)
Basophils Absolute: 0 10*3/uL (ref 0.0–0.1)
Basophils Relative: 0 %
Eosinophils Absolute: 0 10*3/uL (ref 0.0–0.5)
Eosinophils Relative: 0 %
HCT: 33.9 % — ABNORMAL LOW (ref 36.0–46.0)
Hemoglobin: 10.9 g/dL — ABNORMAL LOW (ref 12.0–15.0)
Immature Granulocytes: 1 %
Lymphocytes Relative: 22 %
Lymphs Abs: 1.5 10*3/uL (ref 0.7–4.0)
MCH: 27.7 pg (ref 26.0–34.0)
MCHC: 32.2 g/dL (ref 30.0–36.0)
MCV: 86 fL (ref 80.0–100.0)
Monocytes Absolute: 0.4 10*3/uL (ref 0.1–1.0)
Monocytes Relative: 5 %
Neutro Abs: 4.7 10*3/uL (ref 1.7–7.7)
Neutrophils Relative %: 72 %
Platelets: 363 10*3/uL (ref 150–400)
RBC: 3.94 MIL/uL (ref 3.87–5.11)
RDW: 14.6 % (ref 11.5–15.5)
WBC: 6.6 10*3/uL (ref 4.0–10.5)
nRBC: 0 % (ref 0.0–0.2)

## 2020-11-15 LAB — BASIC METABOLIC PANEL
Anion gap: 6 (ref 5–15)
BUN: 22 mg/dL (ref 8–23)
CO2: 28 mmol/L (ref 22–32)
Calcium: 8.6 mg/dL — ABNORMAL LOW (ref 8.9–10.3)
Chloride: 102 mmol/L (ref 98–111)
Creatinine, Ser: 0.65 mg/dL (ref 0.44–1.00)
GFR, Estimated: >60 mL/min (ref >60)
Glucose, Bld: 128 mg/dL — ABNORMAL HIGH (ref 70–99)
Potassium: 3.3 mmol/L — ABNORMAL LOW (ref 3.5–5.1)
Sodium: 136 mmol/L (ref 135–145)

## 2020-11-15 LAB — MAGNESIUM: Magnesium: 2.1 mg/dL (ref 1.7–2.4)

## 2020-11-15 LAB — GLUCOSE, CAPILLARY
Glucose-Capillary: 94 mg/dL (ref 70–99)
Glucose-Capillary: 200 mg/dL — ABNORMAL HIGH (ref 70–99)
Glucose-Capillary: 149 mg/dL — ABNORMAL HIGH (ref 70–99)
Glucose-Capillary: 172 mg/dL — ABNORMAL HIGH (ref 70–99)

## 2020-11-15 MED ORDER — POTASSIUM CHLORIDE CRYS ER 20 MEQ PO TBCR
40.0000 meq | EXTENDED_RELEASE_TABLET | Freq: Once | ORAL | Status: AC
  Administered 2020-11-15: 40 meq via ORAL
  Filled 2020-11-15: qty 2

## 2020-11-15 NOTE — Progress Notes (Signed)
[facility]  PROGRESS NOTE  ASSESSMENT / PLAN: Jenna Long is a 86 y.o. female with  1) temporal arteritis. Reviewed pathology results with patient. Discussed importance of continued rheumatology care.  2) recent syncope. This seems orthostatic to me. The patient has no focal symptoms. No CT angiogram evidence of hemodynamically significant atherosclerosis in cervical vessels. Recommend best medical therapy for atherosclerosis: ASA / Statin.  SUBJECTIVE: No complaints.  I reviewed pathology results with her.  We discussed her syncopal event yesterday.  She reports symptoms started when standing, and she felt presyncopal.  She denies any focal symptoms.  She specifically denies unilateral weakness, numbness, facial droop.  She denies any difficulty speaking or swallowing.  OBJECTIVE: BP (!) 167/70 (BP Location: Right Arm)   Pulse 72   Temp 97.9 F (36.6 C) (Oral)   Resp 15   Ht 5\' 7"  (1.702 m)   Wt 60 kg   SpO2 94%   BMI 20.72 kg/m   Intake/Output Summary (Last 24 hours) at 11/15/2020 0825 Last data filed at 11/14/2020 2019 Gross per 24 hour  Intake 290 ml  Output 125 ml  Net 165 ml    Constitutional: Older lady, but well appearing. no acute distress. CNS: 5 out of 5 strength throughout.  No visual acuity in right eye.  Otherwise cranial nerves intact. Cardiac: Regular rate and rhythm. Pulmonary: Unlabored Abdomen: Soft Vascular: Right temporal incision healing well  CBC Latest Ref Rng & Units 11/15/2020 11/09/2020 11/07/2020  WBC 4.0 - 10.5 K/uL 6.6 3.3(L) -  Hemoglobin 12.0 - 15.0 g/dL 10.9(L) 10.0(L) 10.9(L)  Hematocrit 36.0 - 46.0 % 33.9(L) 31.5(L) 32.0(L)  Platelets 150 - 400 K/uL 363 457(H) -     CMP Latest Ref Rng & Units 11/15/2020 11/09/2020 11/07/2020  Glucose 70 - 99 mg/dL 128(H) 252(H) 207(H)  BUN 8 - 23 mg/dL 22 13 19   Creatinine 0.44 - 1.00 mg/dL 0.65 0.63 0.80  Sodium 135 - 145 mmol/L 136 133(L) 136  Potassium 3.5 - 5.1 mmol/L  3.3(L) 4.0 4.3  Chloride 98 - 111 mmol/L 102 99 100  CO2 22 - 32 mmol/L 28 24 -  Calcium 8.9 - 10.3 mg/dL 8.6(L) 9.0 -  Total Protein 6.5 - 8.1 g/dL - - -  Total Bilirubin 0.3 - 1.2 mg/dL - - -  Alkaline Phos 38 - 126 U/L - - -  AST 15 - 41 U/L - - -  ALT 0 - 44 U/L - - -    Estimated Creatinine Clearance: 49.6 mL/min (by C-G formula based on SCr of 0.65 mg/dL).  Yevonne Aline. Stanford Breed, MD [facility] Phone Number: [phone] 11/15/2020 8:25 AM

## 2020-11-15 NOTE — Progress Notes (Signed)
Physical Therapy Treatment Patient Details Name: Jenna Long MRN: [id number] DOB: 1934-09-15 Today's Date: 11/15/2020   History of Present Illness Pt is 86 y/o female presented to ED on 9/20 for R vision loss. Was being sent to opthamologist, however office was closed prior to arrival and decided to come to ED. 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 MRI orbits revelaed abnormal enhancement along optic nerve sheaths bilaterally, compatible with bilateral optic nerve perineuritis. 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 PMH: HTN, HLD, DM    PT Comments    Patient progressing slowly towards PT goals. Pt in good spirits today and motivated to participate in therapy. Tolerated gait training with Min A and use of RW for support with close chair follow due to hx of syncope and orthostasis. Pt with positive orthostatics, limited mobility. See below. Supine 166/80, 81 bpm Sitting BP 174/98, 114 bpm Standing BP 131/90 126 bpm Standing after 3 mins BP 123/102 128 bpm BP post session in bed 154/106 147 bpm  Requires Min-mod A to stand from chair due to posterior bias. MD present in middle of session and will order ted hose to donn to see if this helps decrease drop in BP with change in position. May consider ace wraps and abdominal binder as well if needed. Education re: importance of waiting a few mins after changing position prior to mobility to decrease fall risk. Will continue to follow and progress as tolerated. Continue to recommend CIR.   Recommendations for follow up therapy are one component of a multi-disciplinary discharge planning process, led by the attending physician.  Recommendations may be updated based on patient status, additional functional criteria and insurance authorization.  Follow Up Recommendations  CIR     Equipment Recommendations  Rolling walker with 5" wheels     Recommendations for Other Services       Precautions / Restrictions Precautions Precautions: Fall;Other (comment) Precaution Comments: orthostatic Restrictions Weight Bearing Restrictions: No     Mobility  Bed Mobility Overal bed mobility: Needs Assistance Bed Mobility: Supine to Sit     Supine to sit: Min assist;HOB elevated Sit to supine: Supervision;HOB elevated   General bed mobility comments: Increased time and cues to sit, assist with trunk to get upright. ABle to bring LEs into bed to return to supine.    Transfers Overall transfer level: Needs assistance Equipment used: Rolling walker (2 wheeled) Transfers: Sit to/from Stand Sit to Stand: Min assist;Mod assist         General transfer comment: Min-Mod A to stand from EOB x1, from chair x5 with cues for hand placement and anterior translation. no dizziness.  Ambulation/Gait Ambulation/Gait assistance: Min assist;+2 safety/equipment Gait Distance (Feet): 20 Feet Assistive device: Rolling walker (2 wheeled) Gait Pattern/deviations: Decreased stride length;Shuffle;Step-through pattern;Trunk flexed Gait velocity: decreased   General Gait Details: Slow, mildly unsteady gait; reports feeling weak after 20' and trying to sit prematurely, chair follow needed.   Stairs             Wheelchair Mobility    Modified Rankin (Stroke Patients Only) Modified Rankin (Stroke Patients Only) Pre-Morbid Rankin Score: Slight disability Modified Rankin: Moderately severe disability     Balance Overall balance assessment: Needs assistance;History of Falls Sitting-balance support: Feet supported;No upper extremity supported Sitting balance-Leahy Scale: Fair     Standing balance support: During functional activity Standing balance-Leahy Scale: Poor Standing balance comment: requiring RW and  UE support.                            Cognition Arousal/Alertness: Awake/alert Behavior During Therapy: WFL  for tasks assessed/performed Overall Cognitive Status: Impaired/Different from baseline Area of Impairment: Safety/judgement;Problem solving                         Safety/Judgement: Decreased awareness of safety;Decreased awareness of deficits Awareness: Emergent Problem Solving: Slow processing General Comments: Limited insight into deficits or safety.      Exercises Other Exercises Other Exercises: sit to stand x5 from chair for strengthening    General Comments General comments (skin integrity, edema, etc.): Supine 166/80, 81 bpm Sitting BP 174/98, 114 bpm; Standing BP 131/90 126 bpm, standing after 3 mins BP 123/102 128 bpm; BP post session in bed 154/106 147 bpm      Pertinent Vitals/Pain Pain Assessment: No/denies pain    Home Living                      Prior Function            PT Goals (current goals can now be found in the care plan section) Progress towards PT goals: Progressing toward goals (slowly)    Frequency    Min 4X/week      PT Plan Current plan remains appropriate    Co-evaluation              AM-PAC PT "6 Clicks" Mobility   Outcome Measure  Help needed turning from your back to your side while in a flat bed without using bedrails?: A Little Help needed moving from lying on your back to sitting on the side of a flat bed without using bedrails?: A Little Help needed moving to and from a bed to a chair (including a wheelchair)?: A Little Help needed standing up from a chair using your arms (e.g., wheelchair or bedside chair)?: A Lot Help needed to walk in hospital room?: A Little Help needed climbing 3-5 steps with a railing? : A Lot 6 Click Score: 16    End of Session Equipment Utilized During Treatment: Gait belt Activity Tolerance: Treatment limited secondary to medical complications (Comment) (orthostasis) Patient left: in bed;with call bell/phone within reach;with bed alarm set Nurse Communication: Mobility  status PT Visit Diagnosis: Unsteadiness on feet (R26.81);Muscle weakness (generalized) (M62.81);Difficulty in walking, not elsewhere classified (R26.2)     Time: 9024-0973 PT Time Calculation (min) (ACUTE ONLY): 24 min  Charges:  $Gait Training: 8-22 mins $Therapeutic Activity: 8-22 mins                     Marisa Severin, PT, DPT Acute Rehabilitation Services Pager [phone] Office [place] 11/15/2020, 2:49 PM

## 2020-11-15 NOTE — Progress Notes (Signed)
Inpatient Rehab Admissions Coordinator:     I do not yet have insurance auth or a bed for Pt. In CIR. I will continue to follow for potential admit pending bed availability and insurance auth.   Clemens Catholic, [place], [place] Admissions Coordinator  [phone] ([place]) [phone] (office)

## 2020-11-15 NOTE — Progress Notes (Signed)
PROGRESS NOTE    Jenna Long  RKY:[id number] DOB: 02/16/1935 DOA: 11/07/2020 PCP: Jenna Sites, MD   Brief Narrative: Taken from H&P. Jenna Long is a 86 y.o. female with medical history significant of HTN; HLD; and DM presenting with R eye vision loss. She has had inner ear and sinus problems.  The inner ear went away but the sinus stopped her head up.  When it started again, she noticed a film over her R eye.  She called Jenna Long and made an appointment for Monday at 3:15.  She was concerned that she had lost her eyesight.  Jenna Long understood what was going on and called Jenna Long for an appointment at 5pm  They were late for the appointment and so they came to the ER.  It has not changed in the ER.  The film has gone but the vision is completely gone -0 can not even see light.  No headache but she has had temporal discomfort for about 2 weeks since her sinuses got stopped up.  She gets pulsating pains in her R temporal region and goes to lie down until they improve. Elevated inflammatory markers.  COVID-19 negative.  MRI with new small right cerebellar infarct. 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  Concern of giant cell arteritis-patient underwent right temporal artery biopsy on 11/09/2020.  She was also started on high-dose steroid for 3 days-completed a course today. Temporal artery duplex with Halo sign on right which is consistent with temporal arteritis.  No sign on left.  Still no vision on right-can be a permanent vision loss as symptoms started approximately 2 weeks ago.  She will be started on prednisone at 1 mg/kg/day from tomorrow and will need a very slow taper, like 50 mg after 2 weeks, decreasing 10 mg every 2 weeks.  Patient will also need outpatient neurology and rheumatology appointments for further management.  9/24: Patient decided to proceed with CIR-medically stable awaiting CIR evaluation, bed availability and insurance authorization.  9/26: Patient  developed another syncopal episode while working with PT in the early afternoon. There was some reported transient loss of vision in the left eye which prompted reevaluation by neurology.  Patient had multiple intra and extracranial vascular stenosis and will remain high risk for stroke.  Her blood pressure noted during that episode was around 315 systolic, normal blood pressure remained around 130s, can be a orthostatic, per neurology patient should allow mild permissive hypertension to keep the perfusion to the brain within acceptable range.  Decrease the dose of amlodipine from 10-5.  We will keep monitoring. She might have ischemic right optic neuropathy although inflammatory markers were more concerning for temporal arteritis, has not resume vision yet despite giving high-dose steroid. Biopsy results still pending.  Addendum: Biopsy results came back positive for temporal arteritis.  Patient will need rheumatology consult on discharge.  9/27: Patient with positive orthostatic vitals although remained asymptomatic while working with PT today.  Vascular surgery was reconsulted and they are recommending medical management. Compression stockings ordered.  Subjective: Patient was seen and examined while working with PT today.  Positive orthostatic vitals but patient remains asymptomatic and able to participate.  Denies any new complaints.  No neurodeficit.  Still no vision in right eye.  Assessment & Plan:   Principal Problem:   Temporal arteritis ([place]) Active Problems:   Essential hypertension   Mixed hyperlipidemia   Diabetes mellitus type 2 in nonobese [facility])   DNR (do not resuscitate)  Cerebral thrombosis with cerebral infarction  Right vision loss.  Temporal artery biopsy results came back positive for temporal arteritis.  Patient also had subacute temporal pain.  Recent history of sinus congestion.  Elevated CRP.  Concern of giant cell arteritis/temporal arteritis versus ischemic optic  neuropathy. Vascular surgery was consulted and patient underwent successful temporal artery biopsy on 9/21, which are positive for temporal arteritis.  Vascular surgery was reconsulted due to presyncopal episodes with standing, they are recommending medical management as there is no significant cervical vascular stenosis. 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  2 small acute infarcts involving the inferior right cerebellar hemisphere.  Chronic lacunar infarcts. Arterial duplex of temporal arteries positive for halo sign on right which is consistent with temporal arteritis.  Completed a 3-day course of high-dose steroid. -Start her on 60 mg of prednisone daily, she was started on 11/11/2020-she will need a very slow taper over the course of many weeks in the future.  Decreased 10 mg every 2-week.  She will also need a close follow-up by PCP and vascular surgery.  She will also need follow-up with rheumatology and neurology. Most likely a permanent right sided vision loss. -Continue Trusopt in B eyes for now (glaucoma) -Patient will need outpatient ophthalmology evaluation.  Recurrent syncope.  Might be vasovagal but patient do have multiple severe stenosis involving both intra and extracranial vessels.  Small decrease in blood pressure can decrease the perfusion of brain which can become symptomatic. No other focal deficit.  Still no vision in right eye. Neurology was reconsulted due to her complaint of transient loss of vision in left eye.  Reimaging without any new changes.  Orthostatic vitals positive.  Vascular surgery is recommending medical management only. -Compression stockings ordered. -PT is recommending CIR-pending insurance authorization  Acute small inferior right cerebellar infarcts.  CTA with moderate to severe stenosis involving multiple vessels which include basilar, bilateral P2 PCAs.  Severe stenosis of M1 and M2 of MCA bilaterally.  Severe stenosis of left ACA.  Severe  stenosis of right vertebral artery and moderate stenosis of left vertebral artery.  Moderate stenosis of carotid bifurcation.   Lipid panel with LDL of 100, goal of less than 70. -Continue with statin  Type 2 diabetes mellitus.  On Glucophage and Januvia at home.  Well-controlled with A1c of 6.3. CBC currently within goal after increasing basal. -Continue Long-acting to 10 units twice daily. -Continue with SSI  Hypertension.  Blood pressure improving after addition of losartan.  Due to multiple intra and extracranial vascular stenosis she is very sensitive to low blood pressure, per neurology which should allow mild permissive hypertension all the time.  Keep systolic around 536 might be helpful. -Decrease the dose of amlodipine to 5 mg daily. -Continue losartan and monitor.  Objective: Vitals:   11/15/20 0732 11/15/20 0900 11/15/20 1100 11/15/20 1518  BP: (!) 167/70  113/77 138/74  Pulse: 72  100 61  Resp: 15  17 16   Temp: 97.9 F (36.6 C)  98 F (36.7 C) (!) 97.5 F (36.4 C)  TempSrc: Oral  Oral Oral  SpO2: 94% 100% 98% 100%  Weight:      Height:        Intake/Output Summary (Last 24 hours) at 11/15/2020 1648 Last data filed at 11/15/2020 1100 Gross per 24 hour  Intake 290 ml  Output 500 ml  Net -210 ml    Filed Weights   11/08/20 2033 11/08/20 2304 11/09/20 0928  Weight: 68.5 kg 60 kg  60 kg    Examination:  General.  Frail elderly lady, in no acute distress. Pulmonary.  Lungs clear bilaterally, normal respiratory effort. CV.  Regular rate and rhythm, no JVD, rub or murmur. Abdomen.  Soft, nontender, nondistended, BS positive. CNS.  Alert and oriented .  No focal neurologic deficit. Extremities.  No edema, no cyanosis, pulses intact and symmetrical. Psychiatry.  Judgment and insight appears normal.   DVT prophylaxis: Lovenox Code Status: DNR Family Communication: Discussed with daughter at bedside. Disposition Plan:  Status is: Inpatient  Remains inpatient  appropriate because:Inpatient level of care appropriate due to severity of illness  Dispo: The patient is from: Home              Anticipated d/c is to: CIR              Patient currently is medically stable   Difficult to place patient No              Level of care: Telemetry Medical  All the records are reviewed and case discussed with Care Management/Social Worker. Management plans discussed with the patient, nursing and they are in agreement.  Consultants:  Neurology Vascular surgery  Procedures:  Right temporal artery biopsy  Antimicrobials:   Data Reviewed: I have personally reviewed following labs and imaging studies  CBC: Recent Labs  Lab 11/09/20 0144 11/15/20 0242  WBC 3.3* 6.6  NEUTROABS  --  4.7  HGB 10.0* 10.9*  HCT 31.5* 33.9*  MCV 86.5 86.0  PLT 457* 644    Basic Metabolic Panel: Recent Labs  Lab 11/09/20 0144 11/15/20 0242  NA 133* 136  K 4.0 3.3*  CL 99 102  CO2 24 28  GLUCOSE 252* 128*  BUN 13 22  CREATININE 0.63 0.65  CALCIUM 9.0 8.6*  MG  --  2.1    GFR: Estimated Creatinine Clearance: 49.6 mL/min (by C-G formula based on SCr of 0.65 mg/dL). Liver Function Tests: No results for input(s): AST, ALT, ALKPHOS, BILITOT, PROT, ALBUMIN in the last 168 hours.  No results for input(s): LIPASE, AMYLASE in the last 168 hours. No results for input(s): AMMONIA in the last 168 hours. Coagulation Profile: No results for input(s): INR, PROTIME in the last 168 hours.  Cardiac Enzymes: No results for input(s): CKTOTAL, CKMB, CKMBINDEX, TROPONINI in the last 168 hours. BNP (last 3 results) No results for input(s): PROBNP in the last 8760 hours. HbA1C: No results for input(s): HGBA1C in the last 72 hours.  CBG: Recent Labs  Lab 11/14/20 1648 11/14/20 1955 11/15/20 0623 11/15/20 1143 11/15/20 1620  GLUCAP 322* 237* 94 200* 149*    Lipid Profile: No results for input(s): CHOL, HDL, LDLCALC, TRIG, CHOLHDL, LDLDIRECT in the last 72  hours.  Thyroid Function Tests: No results for input(s): TSH, T4TOTAL, FREET4, T3FREE, THYROIDAB in the last 72 hours. Anemia Panel: No results for input(s): VITAMINB12, FOLATE, FERRITIN, TIBC, IRON, RETICCTPCT in the last 72 hours. Sepsis Labs: No results for input(s): PROCALCITON, LATICACIDVEN in the last 168 hours.  Recent Results (from the past 240 hour(s))  SARS CORONAVIRUS 2 ([name] 6-24 HRS) Nasopharyngeal Nasopharyngeal Swab     Status: None   Collection Time: 11/08/20 10:18 AM   Specimen: Nasopharyngeal Swab  Result Value Ref Range Status   SARS Coronavirus 2 NEGATIVE NEGATIVE Final    Comment: (NOTE) SARS-CoV-2 target nucleic acids are NOT DETECTED.  The SARS-CoV-2 RNA is generally detectable in upper and lower respiratory specimens during the acute phase of infection.  Negative results do not preclude SARS-CoV-2 infection, do not rule out co-infections with other pathogens, and should not be used as the sole basis for treatment or other patient management decisions. Negative results must be combined with clinical observations, patient history, and epidemiological information. The expected result is Negative.  Fact Sheet for Patients: SugarRoll.be  Fact Sheet for Healthcare Providers: https://www.woods-mathews.com/  This test is not yet approved or cleared by the [place] FDA and  has been authorized for detection and/or diagnosis of SARS-CoV-2 by FDA under an Emergency Use Authorization (EUA). This EUA will remain  in effect (meaning this test can be used) for the duration of the COVID-19 declaration under Se ction 564(b)(1) of the Act, 21 U.S.C. section 360bbb-3(b)(1), unless the authorization is terminated or revoked sooner.  Performed at [facility], [place] [address], [place], [place] [address]       Radiology Studies: CT ANGIO HEAD NECK W WO CM  Result Date: 11/14/2020 CLINICAL DATA:  Syncope, recurrent  EXAM: CT ANGIOGRAPHY HEAD AND NECK TECHNIQUE: Multidetector CT imaging of the head and neck was performed using the standard protocol during bolus administration of intravenous contrast. Multiplanar CT image reconstructions and MIPs were obtained to evaluate the vascular anatomy. Carotid stenosis measurements (when applicable) are obtained utilizing NASCET criteria, using the distal internal carotid diameter as the denominator. CONTRAST:  18mL OMNIPAQUE IOHEXOL 350 MG/ML SOLN COMPARISON:  11/08/2020 FINDINGS: CT HEAD Brain: There is no acute intracranial hemorrhage, mass effect, or edema. Gray-white differentiation is preserved. There is no extra-axial fluid collection. Ventricles and sulci are stable in size and configuration. Stable findings of chronic microvascular ischemic changes in the cerebral white matter. Vascular: No hyperdense vessel. Skull: Calvarium is unremarkable. Sinuses/Orbits: No acute finding. Other: None. Review of the MIP images confirms the above findings CTA NECK Aortic arch: Stable appearance.  Great vessel origins are patent. Right carotid system: Stable appearance. Minimal calcified plaque at the ICA origin. No stenosis. Partially retropharyngeal course. Left carotid system: Stable. Calcified plaque along the distal common carotid and proximal internal carotid with less than 50% stenosis. Vertebral arteries: Stable appearance of patent left vertebral artery with calcified plaque causing stenosis at the origin. Multifocal severe stenosis of the right vertebral artery with occlusion at the C2-C3 level. Decreased reconstitution of proximal right V3 segment. Skeleton: Stable appearance. Other neck: No new findings. Upper chest: No new findings. Review of the MIP images confirms the above findings CTA HEAD Anterior circulation: Intracranial internal carotid arteries are patent with calcified plaque causing similar mild narrowing, left greater than right. Anterior and middle cerebral arteries are  patent. Anterior and middle cerebral arteries are patent. Stable appearance of bilateral stenoses. Posterior circulation: Stable appearance of partially opacified and irregular appearing intracranial right vertebral artery. Stable appearance of patent left vertebral artery and plaque. Stable appearance of basilar artery with areas of stenosis. Stable appearance of posterior cerebral arteries with areas of stenosis. Venous sinuses: Patent as allowed by contrast bolus timing. Review of the MIP images confirms the above findings IMPRESSION: No acute intracranial abnormality. No substantial change in appearance of vascular imaging compared to recent prior study with multifocal atherosclerosis and stenoses of anterior and posterior circulations. Electronically Signed   By: Macy Mis M.D.   On: 11/14/2020 16:13    Scheduled Meds:  amLODipine  5 mg Oral Daily   aspirin EC  81 mg Oral Daily   atorvastatin  40 mg Oral Daily   clopidogrel  75 mg Oral Daily  docusate sodium  100 mg Oral BID   dorzolamide  1 drop Both Eyes BID   enoxaparin (LOVENOX) injection  40 mg Subcutaneous Daily   insulin aspart  0-15 Units Subcutaneous TID WC   insulin glargine-yfgn  10 Units Subcutaneous BID   losartan  25 mg Oral Daily   predniSONE  60 mg Oral Q breakfast   sodium chloride flush  3 mL Intravenous Q12H   Continuous Infusions:  sodium chloride 10 mL/hr at 11/09/20 0930     LOS: 7 days   Time spent: 40 minutes. More than 50% of the time was spent in counseling/coordination of care  Lorella Nimrod, MD [facility]  If 7PM-7AM, please contact night-coverage [email or web address]  11/15/2020, 4:48 PM   This record has been created using Systems analyst. Errors have been sought and corrected,but may not always be located. Such creation errors do not reflect on the standard of care.

## 2020-11-16 DIAGNOSIS — M316 Other giant cell arteritis: Secondary | ICD-10-CM | POA: Diagnosis not present

## 2020-11-16 LAB — GLUCOSE, CAPILLARY
Glucose-Capillary: 82 mg/dL (ref 70–99)
Glucose-Capillary: 265 mg/dL — ABNORMAL HIGH (ref 70–99)
Glucose-Capillary: 221 mg/dL — ABNORMAL HIGH (ref 70–99)
Glucose-Capillary: 177 mg/dL — ABNORMAL HIGH (ref 70–99)

## 2020-11-16 NOTE — Progress Notes (Signed)
Patient complaining of vision loss. Otherwise neurologically intact. RN notified MD. Will continue to assess.

## 2020-11-16 NOTE — Progress Notes (Signed)
Physical Therapy Treatment Patient Details Name: Jenna Long MRN: [id number] DOB: 1934-10-17 Today's Date: 11/16/2020   History of Present Illness Pt is 86 y/o female presented to ED on 9/20 for R vision loss. Was being sent to opthamologist, however office was closed prior to arrival and decided to come to ED. 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 MRI orbits revelaed abnormal enhancement along optic nerve sheaths bilaterally, compatible with bilateral optic nerve perineuritis. 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 PMH: HTN, HLD, DM    PT Comments    PT saw pt after OT session. Pt eager to eat lunch, but agreeable to hallway mobility. Pt overall requiring light assist to steady an correct LOB during mobility, and requires frequent cues for form and safety throughout mobility. Pt with no s/s of orthostatic hypotension with mobility today. PT to continue to follow, pt awaits CIR bed.     Recommendations for follow up therapy are one component of a multi-disciplinary discharge planning process, led by the attending physician.  Recommendations may be updated based on patient status, additional functional criteria and insurance authorization.  Follow Up Recommendations  CIR     Equipment Recommendations  Rolling walker with 5" wheels    Recommendations for Other Services       Precautions / Restrictions Precautions Precautions: Fall;Other (comment) Precaution Comments: history of orthostatic hypotension while acute Restrictions Weight Bearing Restrictions: No     Mobility  Bed Mobility Overal bed mobility: Needs Assistance             General bed mobility comments: up with OT    Transfers Overall transfer level: Needs assistance Equipment used: Rolling walker (2 wheeled) Transfers: Sit to/from Stand Sit to Stand: Min assist         General transfer  comment: min assist for rise and slow eccentric lower when moving stand>sit.  Ambulation/Gait Ambulation/Gait assistance: Min assist;+2 safety/equipment (chair follow) Gait Distance (Feet): 100 Feet Assistive device: Rolling walker (2 wheeled) Gait Pattern/deviations: Decreased stride length;Shuffle;Step-through pattern;Trunk flexed Gait velocity: decr   General Gait Details: steadying assist, especially with directional changes and backing up to recliner. LOB x2 both posterior requiring PT intervention to correct. cues for proximity to RW, upright posture.   Stairs             Wheelchair Mobility    Modified Rankin (Stroke Patients Only) Modified Rankin (Stroke Patients Only) Pre-Morbid Rankin Score: Slight disability Modified Rankin: Moderately severe disability     Balance Overall balance assessment: Needs assistance;History of Falls Sitting-balance support: Feet supported;No upper extremity supported Sitting balance-Leahy Scale: Fair     Standing balance support: During functional activity Standing balance-Leahy Scale: Poor Standing balance comment: requiring RW and PT assist                            Cognition Arousal/Alertness: Awake/alert Behavior During Therapy: WFL for tasks assessed/performed Overall Cognitive Status: Impaired/Different from baseline Area of Impairment: Safety/judgement;Problem solving;Following commands                       Following Commands: Follows one step commands consistently Safety/Judgement: Decreased awareness of safety;Decreased awareness of deficits Awareness: Emergent Problem Solving: Slow processing;Requires tactile cues;Requires verbal cues General Comments: Pt consistently following commands during session, at times does require repeated cues. Poor insight into deficits, does not notice LOB  x2 during session.      Exercises General Exercises - Lower Extremity Long Arc Quad: AROM;Both;10  reps;Seated    General Comments General comments (skin integrity, edema, etc.): no symptoms orthostatic hypotension this session, BP at end of session 130s/80s      Pertinent Vitals/Pain Pain Assessment: No/denies pain Pain Intervention(s): Monitored during session    Home Living                      Prior Function            PT Goals (current goals can now be found in the care plan section) Acute Rehab PT Goals Patient Stated Goal: to go home PT Goal Formulation: With patient Time For Goal Achievement: 11/24/20 Potential to Achieve Goals: Good Progress towards PT goals: Progressing toward goals    Frequency    Min 4X/week      PT Plan Current plan remains appropriate    Co-evaluation              AM-PAC PT "6 Clicks" Mobility   Outcome Measure  Help needed turning from your back to your side while in a flat bed without using bedrails?: A Little Help needed moving from lying on your back to sitting on the side of a flat bed without using bedrails?: A Little Help needed moving to and from a bed to a chair (including a wheelchair)?: A Little Help needed standing up from a chair using your arms (e.g., wheelchair or bedside chair)?: A Little Help needed to walk in hospital room?: A Little Help needed climbing 3-5 steps with a railing? : A Lot 6 Click Score: 17    End of Session Equipment Utilized During Treatment: Gait belt Activity Tolerance: Treatment limited secondary to medical complications (Comment) (orthostasis) Patient left: in bed;with call bell/phone within reach;with bed alarm set Nurse Communication: Mobility status PT Visit Diagnosis: Unsteadiness on feet (R26.81);Muscle weakness (generalized) (M62.81);Difficulty in walking, not elsewhere classified (R26.2)     Time: 7619-5093 PT Time Calculation (min) (ACUTE ONLY): 11 min  Charges:  $Gait Training: 8-22 mins                    Stacie Glaze, PT DPT Acute Rehabilitation Services Pager  [phone]  Office [phone]    [place] 11/16/2020, 12:51 PM

## 2020-11-16 NOTE — Progress Notes (Signed)
Occupational Therapy Treatment Patient Details Name: Jenna Long MRN: [id number] DOB: [birth year] Today's Date: 11/16/2020   History of present illness Pt is 86 y/o female presented to ED on 9/20 for R vision loss. Was being sent to opthamologist, however office was closed prior to arrival and decided to come to ED. 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 MRI orbits revelaed abnormal enhancement along optic nerve sheaths bilaterally, compatible with bilateral optic nerve perineuritis. 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 PMH: HTN, HLD, DM   OT comments  OT entering room as bed alarm sounding to pt sitting EOB reporting plan to transfer to recliner for lunch. Pt noted with soiled brief and with encouragement, agreeable to bathe peri region and don new briefs. Pt overall Min A for LB bathing and Max A for LB dressing (difficulty reaching B feet; daughter reports pt uses reacher at home for task). Pt overall Min A for transfers and improved stability with RW use noted. Pt observed with impulsivity and decreased awareness of deficits during session. Continue to recommend CIR with pt/family agreeable with this plan. BP readings WFL during session.    Recommendations for follow up therapy are one component of a multi-disciplinary discharge planning process, led by the attending physician.  Recommendations may be updated based on patient status, additional functional criteria and insurance authorization.    Follow Up Recommendations  CIR;Supervision/Assistance - 24 hour    Equipment Recommendations  Other (comment) (defer to next venue)    Recommendations for Other Services Rehab consult    Precautions / Restrictions Precautions Precautions: Fall;Other (comment) Precaution Comments: history of orthostatic hypotension and syncope during admission Restrictions Weight Bearing Restrictions:  No       Mobility Bed Mobility Overal bed mobility: Needs Assistance             General bed mobility comments: sitting EOB on entry    Transfers Overall transfer level: Needs assistance Equipment used: Rolling walker (2 wheeled) Transfers: Sit to/from Stand Sit to Stand: Min assist         General transfer comment: min assist for rise and slow eccentric lower when moving stand>sit.    Balance Overall balance assessment: Needs assistance;History of Falls Sitting-balance support: Feet supported;No upper extremity supported Sitting balance-Leahy Scale: Fair     Standing balance support: During functional activity Standing balance-Leahy Scale: Poor Standing balance comment: requiring RW and external assist; loses balance when distracted                           ADL either performed or assessed with clinical judgement   ADL Overall ADL's : Needs assistance/impaired             Lower Body Bathing: Minimal assistance;Sit to/from stand Lower Body Bathing Details (indicate cue type and reason): able to bathe peri region with min guard to maintain balance. Difficulty reaching feet     Lower Body Dressing: Maximal assistance;Sit to/from stand Lower Body Dressing Details (indicate cue type and reason): Max A to don around feet, cues to assist around waist. Pt daughter present reports pt uses reacher to get clothing around feet at home             Functional mobility during ADLs: Minimal assistance;Rolling walker General ADL Comments: Pt noted with increased impulsivity and decreased safety awareness. Entering as pt sitting EOB and reporting desire to  transfer to chair (bed alarm sounding) with daughter present. Encouragement needed to change soiled brief (daughter also encouraged) with pt agreeable. No drops in BP noted. Pt reports vision issues still present but too distractible to attend to tasks     Vision   Vision Assessment?: Vision impaired- to  be further tested in functional context Additional Comments: unable to correctly identify therapist's scrub colors; reports vision issues still present but very vague about symptoms when asked   Perception     Praxis      Cognition Arousal/Alertness: Awake/alert Behavior During Therapy: [facility] for tasks assessed/performed;Impulsive Overall Cognitive Status: Impaired/Different from baseline Area of Impairment: Safety/judgement;Problem solving;Following commands;Awareness;Attention                   Current Attention Level: Selective   Following Commands: Follows one step commands consistently Safety/Judgement: Decreased awareness of safety;Decreased awareness of deficits Awareness: Emergent Problem Solving: Slow processing;Requires tactile cues;Requires verbal cues General Comments: Pt notably more vocal and highly distractible during session. impulsive and decreased awareness of safety. Pt with soiled brief on entry but wanted to transfer to chair to eat lunch first without changing soiled brief. cues for safety and to stay on task, noted vague answers when OT inquiring about vision        Exercises General Exercises - Lower Extremity Long Arc Quad: AROM;Both;10 reps;Seated   Shoulder Instructions       General Comments BP at start of session 158/98 with pt sitting EOB, 150/86 standing and 130/74 at end of session.    Pertinent Vitals/ Pain       Pain Assessment: No/denies pain Pain Intervention(s): Monitored during session  Home Living                                          Prior Functioning/Environment              Frequency  Min 2X/week        Progress Toward Goals  OT Goals(current goals can now be found in the care plan section)  Progress towards OT goals: Progressing toward goals  Acute Rehab OT Goals Patient Stated Goal: go to rehab and then go home; eat this burger OT Goal Formulation: With patient Time For Goal Achievement:  11/25/20 Potential to Achieve Goals: Good ADL Goals Pt Will Perform Grooming: with modified independence;standing Pt Will Perform Lower Body Bathing: with modified independence;sit to/from stand Pt Will Perform Lower Body Dressing: with modified independence;sit to/from stand Pt Will Transfer to Toilet: with modified independence;ambulating Additional ADL Goal #1: Pt to demonstrate at least 2 low vision strategies to implement during daily routine  Plan Discharge plan remains appropriate    Co-evaluation                 AM-PAC OT "6 Clicks" Daily Activity     Outcome Measure   Help from another person eating meals?: A Little Help from another person taking care of personal grooming?: A Little Help from another person toileting, which includes using toliet, bedpan, or urinal?: A Lot Help from another person bathing (including washing, rinsing, drying)?: A Lot Help from another person to put on and taking off regular upper body clothing?: A Little Help from another person to put on and taking off regular lower body clothing?: A Lot 6 Click Score: 15    End of Session Equipment Utilized During Treatment:  Gait belt  OT Visit Diagnosis: Unsteadiness on feet (R26.81);Other abnormalities of gait and mobility (R26.89);Muscle weakness (generalized) (M62.81);History of falling (Z91.81)   Activity Tolerance Patient tolerated treatment well   Patient Left Other (comment) (to ambulate with PT)   Nurse Communication Mobility status        Time: 3419-6222 OT Time Calculation (min): 16 min  Charges: OT General Charges $OT Visit: 1 Visit OT Treatments $Self Care/Home Management : 8-22 mins  Malachy Chamber, OTR/L Acute Rehab Services Office: [phone]   Layla Maw 11/16/2020, 1:19 PM

## 2020-11-16 NOTE — Care Management Important Message (Signed)
Important Message  Patient Details  Name: ALEXZA NORBECK MRN: [id number] Date of Birth: 1934-02-26   Medicare Important Message Given:  Yes     Shelda Altes 11/16/2020, 10:22 AM

## 2020-11-16 NOTE — Progress Notes (Addendum)
PROGRESS NOTE    HAVILAH TOPOR  MEQ:[id number] DOB: 07-21-1934 DOA: 11/07/2020 PCP: Sharilyn Sites, MD   Brief Narrative:  This 86 years old female with PMH significant for hypertension, hyperlipidemia, diabetes presented with complaints of right eye vision loss. Patient reported temporal discomfort for about 2 weeks following sinus infection.  She is found to have elevated inflammatory markers.  MRI consistent with new small right cerebellar infarct.  Patient underwent right temporal artery biopsy and she was started on high-dose steroids for 3 days.  Biopsy result confirms the diagnosis of temporal arteritis.  Patient still reports no vision in the right, this could be permanent vision loss , since the symptoms started approximately 2 weeks ago. Patient needs outpatient Neurology and rheumatology appointments.  Patient is awaiting CIR.  medically stable.  Assessment & Plan:   Principal Problem:   Temporal arteritis ([place]) Active Problems:   Essential hypertension   Mixed hyperlipidemia   Diabetes mellitus type 2 in nonobese (HCC)   DNR (do not resuscitate)   Cerebral thrombosis with cerebral infarction  Right-sided vision loss could be secondary to temporal arteritis : Patient reports subacute temporal pain following sinus infection. Found to have elevated ESR and CRP There was a concern for giant cell arteritis versus ischemic optic neuropathy Temporal artery biopsy results positive for temporal arteritis. Vascular surgery was reconsulted due to presyncopal episodes with standing, they are recommending medical management as there is no significant cervical vascular stenosis. 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  2 small acute infarcts involving the inferior right cerebellar hemisphere.  Chronic lacunar infarcts. Arterial duplex of temporal arteries positive for halo sign on right which is consistent with temporal arteritis.  Completed a 3-day course of high-dose  steroid. She is started on on 60 mg of prednisone daily, She will need very slow taper over the course of many weeks in the future.  Decreased 10 mg every 2-week.  She will also need a close follow-up by PCP and vascular surgery.  She will also need follow-up with rheumatology and neurology. Most likely permanent right sided vision loss. Continue Trusopt in B eyes for now (glaucoma) Patient will need outpatient ophthalmology evaluation.   Recurrent syncope: This could be vasovagal but patient do have multiple severe stenosis involving both intra and extracranial vessels.  S mall decrease in blood pressure can decrease the perfusion of brain which can become symptomatic. No other focal deficit.  Still no vision in right eye. Neurology was reconsulted due to her complaint of transient loss of vision in left eye.  Reimaging without any new changes.  Orthostatic vitals positive.  Vascular surgery is recommending medical management only. Continue Compression stockings . PT is recommending CIR-pending insurance authorization.  Acute small inferior right cerebellar infarcts.   CTA with moderate to severe stenosis involving multiple vessels which include basilar, bilateral P2 PCAs.  Severe stenosis of M1 and M2 of MCA bilaterally.  Severe stenosis of left ACA.  Severe stenosis of right vertebral artery and moderate stenosis of left vertebral artery.  Moderate stenosis of carotid bifurcation. Lipid panel with LDL of 100, goal of less than 70. Continue with statin.   Type 2 diabetes mellitus.   Hemoglobin A1c 6.3 which is well controlled. CBC currently within goal after increasing basal. Continue long-acting to 10 units twice daily. Continue with SSI   Hypertension: Blood pressure improving after addition of losartan.  Due to multiple intra and extracranial vascular stenosis she is very sensitive to low blood pressure, per  neurology which should allow mild permissive hypertension all the time.  Keep  systolic around 768 might be helpful. Decrease the dose of amlodipine to 5 mg daily. Continue losartan and monitor.    DVT prophylaxis: Lovenox Code Status: DNR Family Communication: No family at bedside Disposition Plan:   Status is: Inpatient  Remains inpatient appropriate because:Inpatient level of care appropriate due to severity of illness  Dispo: The patient is from: Home              Anticipated d/c is to: CIR              Patient currently is not medically stable to d/c.   Difficult to place patient No  Consultants:  Neurology,   vascular surgery  Procedures: Right Temporal biopsy  Antimicrobials:   Anti-infectives (From admission, onward)    Start     Dose/Rate Route Frequency Ordered Stop   11/09/20 0000  ceFAZolin (ANCEF) IVPB 2g/100 mL premix       Note to Pharmacy: Send with pt to OR   2 g 200 mL/hr over 30 Minutes Intravenous On call 11/08/20 1720 11/09/20 1006       Subjective: Patient was seen and examined at bedside.  Overnight events noted.   Patient reports feeling better, states she is not able to see from right eye,  able to see from left eye.  Objective: Vitals:   11/15/20 2339 11/16/20 0331 11/16/20 0733 11/16/20 1209  BP: (!) 151/107 138/63 (!) 162/66 130/74  Pulse: 65 60 68 69  Resp: _0 Temp: 97.6 F (36.4 C) 97.6 F (36.4 C) 98.2 F (36.8 C) 98.3 F (36.8 C)  TempSrc: Oral Oral Oral Oral  SpO2: 98% 98% 94% 95%  Weight:      Height:        Intake/Output Summary (Last 24 hours) at 11/16/2020 1459 Last data filed at 11/16/2020 1100 Gross per 24 hour  Intake 220 ml  Output 900 ml  Net -680 ml   Filed Weights   11/08/20 2033 11/08/20 2304 11/09/20 0928  Weight: 68.5 kg 60 kg 60 kg    Examination:  General exam: Appears comfortable, not in any acute distress.  Unable to see from right eye. Respiratory system: Clear to auscultation bilaterally, respiratory effort normal. Cardiovascular system: S1-S2 heard, regular  rate and rhythm, no murmur. Gastrointestinal system: Abdomen is soft, nontender, nondistended, BS+ Central nervous system: Alert and oriented x3. No focal neurological deficits. Extremities: No edema, no cyanosis, no clubbing. Skin: No rashes, lesions or ulcers Psychiatry: Judgement and insight appear normal. Mood & affect appropriate.     Data Reviewed: I have personally reviewed following labs and imaging studies  CBC: Recent Labs  Lab 11/15/20 0242  WBC 6.6  NEUTROABS 4.7  HGB 10.9*  HCT 33.9*  MCV 86.0  PLT 088   Basic Metabolic Panel: Recent Labs  Lab 11/15/20 0242  NA 136  K 3.3*  CL 102  CO2 28  GLUCOSE 128*  BUN 22  CREATININE 0.65  CALCIUM 8.6*  MG 2.1   GFR: Estimated Creatinine Clearance: 49.6 mL/min (by C-G formula based on SCr of 0.65 mg/dL). Liver Function Tests: No results for input(s): AST, ALT, ALKPHOS, BILITOT, PROT, ALBUMIN in the last 168 hours. No results for input(s): LIPASE, AMYLASE in the last 168 hours. No results for input(s): AMMONIA in the last 168 hours. Coagulation Profile: No results for input(s): INR, PROTIME in the last 168 hours. Cardiac Enzymes: No results  for input(s): CKTOTAL, CKMB, CKMBINDEX, TROPONINI in the last 168 hours. BNP (last 3 results) No results for input(s): PROBNP in the last 8760 hours. HbA1C: No results for input(s): HGBA1C in the last 72 hours. CBG: Recent Labs  Lab 11/15/20 1143 11/15/20 1620 11/15/20 2046 11/16/20 0619 11/16/20 1208  GLUCAP 200* 149* 172* 82 265*   Lipid Profile: No results for input(s): CHOL, HDL, LDLCALC, TRIG, CHOLHDL, LDLDIRECT in the last 72 hours. Thyroid Function Tests: No results for input(s): TSH, T4TOTAL, FREET4, T3FREE, THYROIDAB in the last 72 hours. Anemia Panel: No results for input(s): VITAMINB12, FOLATE, FERRITIN, TIBC, IRON, RETICCTPCT in the last 72 hours. Sepsis Labs: No results for input(s): PROCALCITON, LATICACIDVEN in the last 168 hours.  Recent Results  (from the past 240 hour(s))  SARS CORONAVIRUS 2 ([name] 6-24 HRS) Nasopharyngeal Nasopharyngeal Swab     Status: None   Collection Time: 11/08/20 10:18 AM   Specimen: Nasopharyngeal Swab  Result Value Ref Range Status   SARS Coronavirus 2 NEGATIVE NEGATIVE Final    Comment: (NOTE) SARS-CoV-2 target nucleic acids are NOT DETECTED.  The SARS-CoV-2 RNA is generally detectable in upper and lower respiratory specimens during the acute phase of infection. Negative results do not preclude SARS-CoV-2 infection, do not rule out co-infections with other pathogens, and should not be used as the sole basis for treatment or other patient management decisions. Negative results must be combined with clinical observations, patient history, and epidemiological information. The expected result is Negative.  Fact Sheet for Patients: SugarRoll.be  Fact Sheet for Healthcare Providers: https://www.woods-mathews.com/  This test is not yet approved or cleared by the [place] FDA and  has been authorized for detection and/or diagnosis of SARS-CoV-2 by FDA under an Emergency Use Authorization (EUA). This EUA will remain  in effect (meaning this test can be used) for the duration of the COVID-19 declaration under Se ction 564(b)(1) of the Act, 21 U.S.C. section 360bbb-3(b)(1), unless the authorization is terminated or revoked sooner.  Performed at [facility], [place] [address] [place], [place] [address]     Radiology Studies: CT ANGIO HEAD NECK W WO CM  Result Date: 11/14/2020 CLINICAL DATA:  Syncope, recurrent EXAM: CT ANGIOGRAPHY HEAD AND NECK TECHNIQUE: Multidetector CT imaging of the head and neck was performed using the standard protocol during bolus administration of intravenous contrast. Multiplanar CT image reconstructions and MIPs were obtained to evaluate the vascular anatomy. Carotid stenosis measurements (when applicable) are obtained utilizing  NASCET criteria, using the distal internal carotid diameter as the denominator. CONTRAST:  62m OMNIPAQUE IOHEXOL 350 MG/ML SOLN COMPARISON:  11/08/2020 FINDINGS: CT HEAD Brain: There is no acute intracranial hemorrhage, mass effect, or edema. Gray-white differentiation is preserved. There is no extra-axial fluid collection. Ventricles and sulci are stable in size and configuration. Stable findings of chronic microvascular ischemic changes in the cerebral white matter. Vascular: No hyperdense vessel. Skull: Calvarium is unremarkable. Sinuses/Orbits: No acute finding. Other: None. Review of the MIP images confirms the above findings CTA NECK Aortic arch: Stable appearance.  Great vessel origins are patent. Right carotid system: Stable appearance. Minimal calcified plaque at the ICA origin. No stenosis. Partially retropharyngeal course. Left carotid system: Stable. Calcified plaque along the distal common carotid and proximal internal carotid with less than 50% stenosis. Vertebral arteries: Stable appearance of patent left vertebral artery with calcified plaque causing stenosis at the origin. Multifocal severe stenosis of the right vertebral artery with occlusion at the C2-C3 level. Decreased reconstitution of proximal right  V3 segment. Skeleton: Stable appearance. Other neck: No new findings. Upper chest: No new findings. Review of the MIP images confirms the above findings CTA HEAD Anterior circulation: Intracranial internal carotid arteries are patent with calcified plaque causing similar mild narrowing, left greater than right. Anterior and middle cerebral arteries are patent. Anterior and middle cerebral arteries are patent. Stable appearance of bilateral stenoses. Posterior circulation: Stable appearance of partially opacified and irregular appearing intracranial right vertebral artery. Stable appearance of patent left vertebral artery and plaque. Stable appearance of basilar artery with areas of stenosis.  Stable appearance of posterior cerebral arteries with areas of stenosis. Venous sinuses: Patent as allowed by contrast bolus timing. Review of the MIP images confirms the above findings IMPRESSION: No acute intracranial abnormality. No substantial change in appearance of vascular imaging compared to recent prior study with multifocal atherosclerosis and stenoses of anterior and posterior circulations. Electronically Signed   By: Macy Mis M.D.   On: 11/14/2020 16:13    Scheduled Meds:  amLODipine  5 mg Oral Daily   aspirin EC  81 mg Oral Daily   atorvastatin  40 mg Oral Daily   clopidogrel  75 mg Oral Daily   docusate sodium  100 mg Oral BID   dorzolamide  1 drop Both Eyes BID   enoxaparin (LOVENOX) injection  40 mg Subcutaneous Daily   insulin aspart  0-15 Units Subcutaneous TID WC   insulin glargine-yfgn  10 Units Subcutaneous BID   losartan  25 mg Oral Daily   predniSONE  60 mg Oral Q breakfast   sodium chloride flush  3 mL Intravenous Q12H   Continuous Infusions:  sodium chloride 10 mL/hr at 11/09/20 0930     LOS: 8 days    Time spent: 35 mins    Tauna Macfarlane, MD [facility]   If 7PM-7AM, please contact night-coverage

## 2020-11-16 NOTE — Progress Notes (Signed)
Inpatient Rehab Admissions Coordinator:   I received insurance auth for CIR but do not have a bed today. I will continue to follow for potential admit pending insurance auth and bed availability.  Clemens Catholic, [place], [place] Admissions Coordinator  [phone] ([place]) [phone] (office)

## 2020-11-17 DIAGNOSIS — M316 Other giant cell arteritis: Secondary | ICD-10-CM | POA: Diagnosis not present

## 2020-11-17 LAB — BASIC METABOLIC PANEL
Anion gap: 8 (ref 5–15)
BUN: 18 mg/dL (ref 8–23)
CO2: 25 mmol/L (ref 22–32)
Calcium: 8.7 mg/dL — ABNORMAL LOW (ref 8.9–10.3)
Chloride: 104 mmol/L (ref 98–111)
Creatinine, Ser: 0.65 mg/dL (ref 0.44–1.00)
GFR, Estimated: >60 mL/min (ref >60)
Glucose, Bld: 102 mg/dL — ABNORMAL HIGH (ref 70–99)
Potassium: 4.1 mmol/L (ref 3.5–5.1)
Sodium: 137 mmol/L (ref 135–145)

## 2020-11-17 LAB — GLUCOSE, CAPILLARY
Glucose-Capillary: 111 mg/dL — ABNORMAL HIGH (ref 70–99)
Glucose-Capillary: 68 mg/dL — ABNORMAL LOW (ref 70–99)
Glucose-Capillary: 196 mg/dL — ABNORMAL HIGH (ref 70–99)
Glucose-Capillary: 256 mg/dL — ABNORMAL HIGH (ref 70–99)
Glucose-Capillary: 234 mg/dL — ABNORMAL HIGH (ref 70–99)
Glucose-Capillary: 245 mg/dL — ABNORMAL HIGH (ref 70–99)

## 2020-11-17 MED ORDER — INSULIN GLARGINE-YFGN 100 UNIT/ML ~~LOC~~ SOLN
8.0000 [IU] | Freq: Two times a day (BID) | SUBCUTANEOUS | Status: DC
  Administered 2020-11-17 – 2020-11-18 (×2): 8 [IU] via SUBCUTANEOUS
  Filled 2020-11-17 (×3): qty 0.08

## 2020-11-17 NOTE — Progress Notes (Signed)
Inpatient Diabetes Program Recommendations  AACE/ADA: New Consensus Statement on Inpatient Glycemic Control (2015)  Target Ranges:  Prepandial:   less than 140 mg/dL      Peak postprandial:   less than 180 mg/dL (1-2 hours)      Critically ill patients:  140 - 180 mg/dL   Lab Results  Component Value Date   GLUCAP 111 (H) 11/17/2020   HGBA1C 6.3 (H) 11/08/2020    Review of Glycemic Control Results for XINYI, BATTON (MRN [id number]) as of 11/17/2020 09:50  Ref. Range 11/16/2020 16:42 11/16/2020 20:59 11/17/2020 06:47 11/17/2020 07:21  Glucose-Capillary Latest Ref Range: 70 - 99 mg/dL 221 (H) 177 (H) 68 (L) 111 (H)   Diabetes history: Type 2 DM Outpatient Diabetes medications: Januvia 100 mg QD, Metformin 1000 mg BID Current orders for Inpatient glycemic control: Novolog 0-15 units TID, Semglee 10 units BID Prednisone 60 mg QD  Inpatient Diabetes Program Recommendations:    Noted mild hypoglycemia this AM of 68 mg/dL, Consider decreasing QHS dose of Semglee to 8 units.   Thanks, Bronson Curb, MSN, RNC-OB Diabetes Coordinator [phone] (8a-5p)

## 2020-11-17 NOTE — Progress Notes (Signed)
PROGRESS NOTE    Jenna Long  VVZ:[id number] DOB: 06/25/1934 DOA: 11/07/2020 PCP: Sharilyn Sites, MD   Brief Narrative:  This 86 years old female with PMH significant for hypertension, hyperlipidemia, diabetes presented with complaints of right eye vision loss. Patient reported temporal discomfort for about 2 weeks following sinus infection.  She is found to have elevated inflammatory markers.  MRI consistent with new small right cerebellar infarct.  Patient underwent right temporal artery biopsy and she was started on high-dose steroids for 3 days.  Biopsy result confirms the diagnosis of temporal arteritis.  Patient still reports no vision in the right, this could be permanent vision loss , since the symptoms started approximately 2 weeks ago. Patient needs outpatient Neurology and rheumatology appointments.  Patient is awaiting CIR.  medically stable.  Assessment & Plan:   Principal Problem:   Temporal arteritis ([place]) Active Problems:   Essential hypertension   Mixed hyperlipidemia   Diabetes mellitus type 2 in nonobese (HCC)   DNR (do not resuscitate)   Cerebral thrombosis with cerebral infarction  Right-sided vision loss could be secondary to temporal arteritis : Patient reports subacute temporal pain following sinus infection. Found to have elevated ESR and CRP There was a concern for giant cell arteritis versus ischemic optic neuropathy Temporal artery biopsy results positive for temporal arteritis. Vascular surgery was reconsulted due to presyncopal episodes with standing, they are recommending medical management as there is no significant cervical vascular stenosis. 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  2 small acute infarcts involving the inferior right cerebellar hemisphere.  Chronic lacunar infarcts. Arterial duplex of temporal arteries positive for halo sign on right which is consistent with temporal arteritis.  Completed a 3-day course of high-dose  steroid. She is started on on 60 mg of prednisone daily, She will need very slow taper over the course of many weeks in the future.  Decreased 10 mg every 2-week.  She will also need a close follow-up by PCP and vascular surgery.  She will also need follow-up with rheumatology and neurology. Most likely permanent right sided vision loss. Continue Trusopt in B eyes for now (glaucoma) Patient will need outpatient ophthalmology evaluation.   Recurrent syncope: This could be vasovagal but patient do have multiple severe stenosis involving both intra and extracranial vessels.  Small decrease in blood pressure can decrease the perfusion of brain which can become symptomatic. No other focal deficit.  Patient reports slight improvement in her right vision. Neurology was reconsulted due to her complaint of transient loss of vision in left eye.  Reimaging without any new changes.  Orthostatic vitals positive.  Vascular surgery is recommending medical management only. Continue Compression stockings . PT is recommending CIR-pending insurance authorization.  Acute small inferior right cerebellar infarcts.   CTA with moderate to severe stenosis involving multiple vessels which include basilar, bilateral P2 PCAs.  Severe stenosis of M1 and M2 of MCA bilaterally.  Severe stenosis of left ACA.  Severe stenosis of right vertebral artery and moderate stenosis of left vertebral artery.  Moderate stenosis of carotid bifurcation. Lipid panel with LDL of 100, goal of less than 70. Continue with statin.   Type 2 diabetes mellitus.   Hemoglobin A1c 6.3 which is well controlled. CBC currently within goal after increasing basal. Continue long-acting to 10 units twice daily. Continue with SSI   Hypertension: Blood pressure improving after addition of losartan.  Due to multiple intra and extracranial vascular stenosis she is very sensitive to low blood pressure,  per neurology which should allow mild permissive  hypertension all the time.  Keep systolic around 416 might be helpful. Decrease the dose of amlodipine to 5 mg daily. Continue losartan and monitor.   DVT prophylaxis: Lovenox Code Status: DNR Family Communication: No family at bedside Disposition Plan:   Status is: Inpatient  Remains inpatient appropriate because:Inpatient level of care appropriate due to severity of illness  Dispo: The patient is from: Home              Anticipated d/c is to: CIR              Patient currently is not medically stable to d/c.   Difficult to place patient No  Consultants:  Neurology,   vascular surgery  Procedures: Right Temporal biopsy  Antimicrobials:   Anti-infectives (From admission, onward)    Start     Dose/Rate Route Frequency Ordered Stop   11/09/20 0000  ceFAZolin (ANCEF) IVPB 2g/100 mL premix       Note to Pharmacy: Send with pt to OR   2 g 200 mL/hr over 30 Minutes Intravenous On call 11/08/20 1720 11/09/20 1006       Subjective: Patient was seen and examined at bedside.  Overnight events noted.   Patient reports feeling better,  states she is now able to see from right eye which is slowly improving.  Objective: Vitals:   11/16/20 2350 11/17/20 0400 11/17/20 0743 11/17/20 1206  BP: (!) 153/70 (!) 157/78 (!) 165/83 129/83  Pulse: 69 69 71 72  Resp:   19 20  Temp: 98.4 F (36.9 C) 97.6 F (36.4 C) 97.7 F (36.5 C) 97.8 F (36.6 C)  TempSrc: Oral Oral Oral Oral  SpO2: 100% 98% 96% 97%  Weight:      Height:        Intake/Output Summary (Last 24 hours) at 11/17/2020 1401 Last data filed at 11/17/2020 0800 Gross per 24 hour  Intake 480 ml  Output 500 ml  Net -20 ml   Filed Weights   11/08/20 2033 11/08/20 2304 11/09/20 0928  Weight: 68.5 kg 60 kg 60 kg    Examination:  General exam: Appears comfortable, not in any acute distress, reports able to see from right eye. Respiratory system: Clear to auscultation bilaterally, respiratory effort  normal. Cardiovascular system: S1-S2 heard, regular rate and rhythm, no murmur. Gastrointestinal system: Abdomen is soft, nontender, nondistended, BS+ Central nervous system: Alert and oriented x 3. No focal neurological deficits. Extremities: No edema, no cyanosis, no clubbing. Skin: No rashes, lesions or ulcers Psychiatry: Judgement and insight appear normal. Mood & affect appropriate.     Data Reviewed: I have personally reviewed following labs and imaging studies  CBC: Recent Labs  Lab 11/15/20 0242  WBC 6.6  NEUTROABS 4.7  HGB 10.9*  HCT 33.9*  MCV 86.0  PLT 606   Basic Metabolic Panel: Recent Labs  Lab 11/15/20 0242 11/17/20 0143  NA 136 137  K 3.3* 4.1  CL 102 104  CO2 28 25  GLUCOSE 128* 102*  BUN 22 18  CREATININE 0.65 0.65  CALCIUM 8.6* 8.7*  MG 2.1  --    GFR: Estimated Creatinine Clearance: 49.6 mL/min (by C-G formula based on SCr of 0.65 mg/dL). Liver Function Tests: No results for input(s): AST, ALT, ALKPHOS, BILITOT, PROT, ALBUMIN in the last 168 hours. No results for input(s): LIPASE, AMYLASE in the last 168 hours. No results for input(s): AMMONIA in the last 168 hours. Coagulation Profile: No results  for input(s): INR, PROTIME in the last 168 hours. Cardiac Enzymes: No results for input(s): CKTOTAL, CKMB, CKMBINDEX, TROPONINI in the last 168 hours. BNP (last 3 results) No results for input(s): PROBNP in the last 8760 hours. HbA1C: No results for input(s): HGBA1C in the last 72 hours. CBG: Recent Labs  Lab 11/16/20 1642 11/16/20 2059 11/17/20 0647 11/17/20 0721 11/17/20 1129  GLUCAP 221* 177* 68* 111* 196*   Lipid Profile: No results for input(s): CHOL, HDL, LDLCALC, TRIG, CHOLHDL, LDLDIRECT in the last 72 hours. Thyroid Function Tests: No results for input(s): TSH, T4TOTAL, FREET4, T3FREE, THYROIDAB in the last 72 hours. Anemia Panel: No results for input(s): VITAMINB12, FOLATE, FERRITIN, TIBC, IRON, RETICCTPCT in the last 72  hours. Sepsis Labs: No results for input(s): PROCALCITON, LATICACIDVEN in the last 168 hours.  Recent Results (from the past 240 hour(s))  SARS CORONAVIRUS 2 ([name] 6-24 HRS) Nasopharyngeal Nasopharyngeal Swab     Status: None   Collection Time: 11/08/20 10:18 AM   Specimen: Nasopharyngeal Swab  Result Value Ref Range Status   SARS Coronavirus 2 NEGATIVE NEGATIVE Final    Comment: (NOTE) SARS-CoV-2 target nucleic acids are NOT DETECTED.  The SARS-CoV-2 RNA is generally detectable in upper and lower respiratory specimens during the acute phase of infection. Negative results do not preclude SARS-CoV-2 infection, do not rule out co-infections with other pathogens, and should not be used as the sole basis for treatment or other patient management decisions. Negative results must be combined with clinical observations, patient history, and epidemiological information. The expected result is Negative.  Fact Sheet for Patients: SugarRoll.be  Fact Sheet for Healthcare Providers: https://www.woods-mathews.com/  This test is not yet approved or cleared by the [place] FDA and  has been authorized for detection and/or diagnosis of SARS-CoV-2 by FDA under an Emergency Use Authorization (EUA). This EUA will remain  in effect (meaning this test can be used) for the duration of the COVID-19 declaration under Se ction 564(b)(1) of the Act, 21 U.S.C. section 360bbb-3(b)(1), unless the authorization is terminated or revoked sooner.  Performed at [facility], [place] [address], [place], [place] [address]     Radiology Studies: No results found.  Scheduled Meds:  amLODipine  5 mg Oral Daily   aspirin EC  81 mg Oral Daily   atorvastatin  40 mg Oral Daily   clopidogrel  75 mg Oral Daily   docusate sodium  100 mg Oral BID   dorzolamide  1 drop Both Eyes BID   enoxaparin (LOVENOX) injection  40 mg Subcutaneous Daily   insulin aspart  0-15  Units Subcutaneous TID WC   insulin glargine-yfgn  8 Units Subcutaneous BID   losartan  25 mg Oral Daily   predniSONE  60 mg Oral Q breakfast   sodium chloride flush  3 mL Intravenous Q12H   Continuous Infusions:  sodium chloride 10 mL/hr at 11/09/20 0930     LOS: 9 days    Time spent: 25 mins    Jenna Wassmer, MD [facility]   If 7PM-7AM, please contact night-coverage

## 2020-11-17 NOTE — Progress Notes (Signed)
Mobility Specialist Progress Note   11/17/20 1155  Mobility  Activity Ambulated in hall  Level of Assistance Minimal assist, patient does 75% or more  Assistive Device Front wheel walker (+2 Chair Follow)  Distance Ambulated (ft) 100 ft ((10+90))  Mobility Ambulated with assistance in hallway  Mobility Response Tolerated well  Mobility performed by Mobility specialist  Bed Position Chair  $Mobility charge 1 Mobility   Received pt in recliner w/ no c/o of symptom. Agreeable to mobility session after encouragement from family member. x1 seated at the door, claiming to have voided a little in depends while seated but wanted to continue ambulation. Pt returned back to room w/ chair. Depends were changed out, call bell was placed by side and RN notified.   Holland Falling Mobility Specialist Phone Number [phone]

## 2020-11-17 NOTE — Plan of Care (Signed)
  Problem: Health Behavior/Discharge Planning: Goal: Ability to manage health-related needs will improve 11/17/2020 2232 by Kaylyn Lim, RN Outcome: Progressing 11/17/2020 2232 by Kaylyn Lim, RN Outcome: Progressing   Problem: Clinical Measurements: Goal: Ability to maintain clinical measurements within normal limits will improve 11/17/2020 2232 by Kaylyn Lim, RN Outcome: Progressing 11/17/2020 2232 by Kaylyn Lim, RN Outcome: Progressing   Problem: Clinical Measurements: Goal: Will remain free from infection 11/17/2020 2232 by Kaylyn Lim, RN Outcome: Progressing 11/17/2020 2232 by Kaylyn Lim, RN Outcome: Progressing   Problem: Clinical Measurements: Goal: Diagnostic test results will improve 11/17/2020 2232 by Kaylyn Lim, RN Outcome: Progressing 11/17/2020 2232 by Kaylyn Lim, RN Outcome: Progressing   Problem: Clinical Measurements: Goal: Cardiovascular complication will be avoided 11/17/2020 2232 by Kaylyn Lim, RN Outcome: Progressing 11/17/2020 2232 by Kaylyn Lim, RN Outcome: Progressing   Problem: Clinical Measurements: Goal: Respiratory complications will improve 11/17/2020 2232 by Kaylyn Lim, RN Outcome: Progressing 11/17/2020 2232 by Kaylyn Lim, RN Outcome: Progressing   Problem: Elimination: Goal: Will not experience complications related to bowel motility 11/17/2020 2232 by Kaylyn Lim, RN Outcome: Progressing 11/17/2020 2232 by Kaylyn Lim, RN Outcome: Progressing   Problem: Pain Managment: Goal: General experience of comfort will improve 11/17/2020 2232 by Kaylyn Lim, RN Outcome: Progressing 11/17/2020 2232 by Kaylyn Lim, RN Outcome: Progressing   Problem: Safety: Goal: Ability to remain free from injury will improve 11/17/2020 2232 by Kaylyn Lim, RN Outcome: Progressing 11/17/2020 2232 by Kaylyn Lim, RN Outcome: Progressing   Problem: Skin Integrity: Goal: Risk for impaired skin integrity will decrease 11/17/2020 2232 by Kaylyn Lim, RN Outcome: Progressing 11/17/2020 2232 by Kaylyn Lim, RN Outcome: Progressing   Problem: Nutritional: Goal: Progress toward achieving an optimal weight will improve 11/17/2020 2232 by Kaylyn Lim, RN Outcome: Progressing 11/17/2020 2232 by Kaylyn Lim, RN Outcome: Progressing   Problem: Tissue Perfusion: Goal: Adequacy of tissue perfusion will improve 11/17/2020 2232 by Kaylyn Lim, RN Outcome: Progressing 11/17/2020 2232 by Kaylyn Lim, RN Outcome: Progressing

## 2020-11-17 NOTE — Progress Notes (Signed)
Physical Therapy Treatment Patient Details Name: Jenna Long MRN: [id number] DOB: 03-01-1934 Today's Date: 11/17/2020   History of Present Illness Pt is an 86 y.o. female admitted 11/07/20 for R vision loss. 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 S/p R temporal artery biopsy 9/21. PMH includes HTN, HLD, DM.   PT Comments    Pt progressing with mobility. Today's session focused on transfer and gait training with RW and pt's cane; pt requiring up to [facility] for stability with standing activity. Pt remains limited by generalized weakness, decreased activity tolerance, visual deficits, poor balance strategies/postural reactions and impaired cognition. Continue to recommend intensive CIR-level therapies to maximize functional mobility and independence prior to return home.    Recommendations for follow up therapy are one component of a multi-disciplinary discharge planning process, led by the attending physician.  Recommendations may be updated based on patient status, additional functional criteria and insurance authorization.  Follow Up Recommendations  CIR;Supervision for mobility/OOB     Equipment Recommendations  Rolling walker with 5" wheels    Recommendations for Other Services       Precautions / Restrictions Precautions Precautions: Fall;Other (comment) Precaution Comments: h/o orthostatic hypotension and syncope during admission Restrictions Weight Bearing Restrictions: No     Mobility  Bed Mobility Overal bed mobility: Needs Assistance Bed Mobility: Supine to Sit     Supine to sit: HOB elevated;Mod assist     General bed mobility comments: ModA for HHA to elevate trunk    Transfers Overall transfer level: Needs assistance   Transfers: Sit to/from Stand Sit to Stand: Min assist;Mod assist          General transfer comment: Pt requiring multiple attempts for initial stand from EOB to RW, ultimately requiring minA for trunk elevation and stability; additional trial standing from bed with SPC, reliant on modA to prevent posterior LOB  Ambulation/Gait Ambulation/Gait assistance: Min assist;Mod assist Gait Distance (Feet): 20 Feet (+80+40) Assistive device: Rolling walker (2 wheeled);Straight cane Gait Pattern/deviations: Decreased stride length;Shuffle;Step-through pattern;Trunk flexed Gait velocity: Decreased   General Gait Details: Initial bouts of in-room and hallway ambulation with RW and minA for stability, seated rest after performing ADL tasks at sink; additional gait trial with pt's cane, pt requiring up to modA to prevent LOB, endorses "my (left) leg feels like it's giving out"   Stairs             Wheelchair Mobility    Modified Rankin (Stroke Patients Only) Modified Rankin (Stroke Patients Only) Pre-Morbid Rankin Score: Slight disability Modified Rankin: Moderately severe disability     Balance Overall balance assessment: Needs assistance;History of Falls Sitting-balance support: Feet supported;No upper extremity supported Sitting balance-Leahy Scale: Fair     Standing balance support: During functional activity;No upper extremity supported;Single extremity supported;Bilateral upper extremity supported Standing balance-Leahy Scale: Poor Standing balance comment: Reliant on UE support             High level balance activites: Backward walking;Direction changes;Turns;Head turns High Level Balance Comments: LOB with higher level balance tasks with RW and cane            Cognition Arousal/Alertness: Awake/alert Behavior During Therapy: WFL for tasks assessed/performed;Impulsive Overall Cognitive Status: Impaired/Different from baseline Area of Impairment: Safety/judgement;Problem solving;Following commands;Awareness;Attention                    Current Attention Level:  Selective   Following Commands: Follows one step commands consistently;Follows multi-step commands inconsistently Safety/Judgement: Decreased awareness of safety;Decreased awareness of deficits Awareness: Emergent Problem Solving: Slow processing;Requires tactile cues;Requires verbal cues General Comments: Pt very conversant and distractable during session; looks for frequent positive reinforcement related to mobility. Cues for safety awareness, difficulty multitasking      Exercises      General Comments General comments (skin integrity, edema, etc.): Post-mobility BP 150/83, pt denies dizziness      Pertinent Vitals/Pain Pain Assessment: No/denies pain Pain Intervention(s): Monitored during session    Home Living                      Prior Function            PT Goals (current goals can now be found in the care plan section) Progress towards PT goals: Progressing toward goals    Frequency    Min 4X/week      PT Plan Current plan remains appropriate    Co-evaluation              AM-PAC PT "6 Clicks" Mobility   Outcome Measure  Help needed turning from your back to your side while in a flat bed without using bedrails?: A Little Help needed moving from lying on your back to sitting on the side of a flat bed without using bedrails?: A Little Help needed moving to and from a bed to a chair (including a wheelchair)?: A Little Help needed standing up from a chair using your arms (e.g., wheelchair or bedside chair)?: A Lot Help needed to walk in hospital room?: A Lot Help needed climbing 3-5 steps with a railing? : A Lot 6 Click Score: 15    End of Session Equipment Utilized During Treatment: Gait belt Activity Tolerance: Patient tolerated treatment well Patient left: in chair;with call bell/phone within reach;with chair alarm set Nurse Communication: Mobility status PT Visit Diagnosis: Unsteadiness on feet  (R26.81);Muscle weakness (generalized) (M62.81);Difficulty in walking, not elsewhere classified (R26.2)     Time: 4656-8127 PT Time Calculation (min) (ACUTE ONLY): 21 min  Charges:  $Gait Training: 8-22 mins                     Mabeline Caras, PT, DPT Acute Rehabilitation Services  Pager [phone] Office [place] 11/17/2020, 1:14 PM

## 2020-11-18 ENCOUNTER — Encounter (HOSPITAL_COMMUNITY): Payer: Self-pay | Admitting: Physical Medicine & Rehabilitation

## 2020-11-18 ENCOUNTER — Other Ambulatory Visit: Payer: Self-pay

## 2020-11-18 ENCOUNTER — Inpatient Hospital Stay (HOSPITAL_COMMUNITY)
Admission: RE | Admit: 2020-11-18 | Discharge: 2020-11-26 | DRG: 057 | Disposition: A | Discharge status: Home / Self Care | Payer: PPO | Source: Intra-hospital | Attending: Physical Medicine & Rehabilitation | Admitting: Physical Medicine & Rehabilitation

## 2020-11-18 DIAGNOSIS — Z96642 Presence of left artificial hip joint: Secondary | ICD-10-CM | POA: Diagnosis present

## 2020-11-18 DIAGNOSIS — I69398 Other sequelae of cerebral infarction: Secondary | ICD-10-CM

## 2020-11-18 DIAGNOSIS — E118 Type 2 diabetes mellitus with unspecified complications: Secondary | ICD-10-CM

## 2020-11-18 DIAGNOSIS — Z8739 Personal history of other diseases of the musculoskeletal system and connective tissue: Secondary | ICD-10-CM

## 2020-11-18 DIAGNOSIS — M316 Other giant cell arteritis: Secondary | ICD-10-CM | POA: Diagnosis present

## 2020-11-18 DIAGNOSIS — I69393 Ataxia following cerebral infarction: Secondary | ICD-10-CM

## 2020-11-18 DIAGNOSIS — E119 Type 2 diabetes mellitus without complications: Secondary | ICD-10-CM | POA: Diagnosis present

## 2020-11-18 DIAGNOSIS — H539 Unspecified visual disturbance: Secondary | ICD-10-CM | POA: Diagnosis present

## 2020-11-18 DIAGNOSIS — Z794 Long term (current) use of insulin: Secondary | ICD-10-CM

## 2020-11-18 DIAGNOSIS — H5461 Unqualified visual loss, right eye, normal vision left eye: Secondary | ICD-10-CM

## 2020-11-18 DIAGNOSIS — Z23 Encounter for immunization: Secondary | ICD-10-CM | POA: Diagnosis not present

## 2020-11-18 DIAGNOSIS — Z79899 Other long term (current) drug therapy: Secondary | ICD-10-CM | POA: Diagnosis not present

## 2020-11-18 DIAGNOSIS — E7800 Pure hypercholesterolemia, unspecified: Secondary | ICD-10-CM | POA: Diagnosis not present

## 2020-11-18 DIAGNOSIS — I69319 Unspecified symptoms and signs involving cognitive functions following cerebral infarction: Secondary | ICD-10-CM

## 2020-11-18 DIAGNOSIS — Z7984 Long term (current) use of oral hypoglycemic drugs: Secondary | ICD-10-CM

## 2020-11-18 DIAGNOSIS — I69351 Hemiplegia and hemiparesis following cerebral infarction affecting right dominant side: Principal | ICD-10-CM

## 2020-11-18 DIAGNOSIS — I10 Essential (primary) hypertension: Secondary | ICD-10-CM | POA: Diagnosis not present

## 2020-11-18 DIAGNOSIS — E785 Hyperlipidemia, unspecified: Secondary | ICD-10-CM

## 2020-11-18 LAB — CBC
HCT: 35.9 % — ABNORMAL LOW (ref 36.0–46.0)
Hemoglobin: 11.3 g/dL — ABNORMAL LOW (ref 12.0–15.0)
MCH: 28 pg (ref 26.0–34.0)
MCHC: 31.5 g/dL (ref 30.0–36.0)
MCV: 89.1 fL (ref 80.0–100.0)
Platelets: 319 10*3/uL (ref 150–400)
RBC: 4.03 MIL/uL (ref 3.87–5.11)
RDW: 15.3 % (ref 11.5–15.5)
WBC: 8.8 10*3/uL (ref 4.0–10.5)
nRBC: 0 % (ref 0.0–0.2)

## 2020-11-18 LAB — GLUCOSE, CAPILLARY
Glucose-Capillary: 71 mg/dL (ref 70–99)
Glucose-Capillary: 348 mg/dL — ABNORMAL HIGH (ref 70–99)
Glucose-Capillary: 198 mg/dL — ABNORMAL HIGH (ref 70–99)
Glucose-Capillary: 224 mg/dL — ABNORMAL HIGH (ref 70–99)

## 2020-11-18 LAB — CREATININE, SERUM
Creatinine, Ser: 0.94 mg/dL (ref 0.44–1.00)
GFR, Estimated: 60 mL/min — ABNORMAL LOW (ref >60)

## 2020-11-18 MED ORDER — ONDANSETRON HCL 4 MG PO TABS
4.0000 mg | ORAL_TABLET | Freq: Four times a day (QID) | ORAL | Status: DC | PRN
  Filled 2020-11-18: qty 1

## 2020-11-18 MED ORDER — PREDNISONE 20 MG PO TABS: 60.0000 mg | ORAL_TABLET | Freq: Every day | ORAL | 0 refills | Status: DC

## 2020-11-18 MED ORDER — DOCUSATE SODIUM 100 MG PO CAPS
100.0000 mg | ORAL_CAPSULE | Freq: Two times a day (BID) | ORAL | Status: DC
  Administered 2020-11-18 – 2020-11-26 (×14): 100 mg via ORAL
  Filled 2020-11-18 (×15): qty 1

## 2020-11-18 MED ORDER — POLYETHYLENE GLYCOL 3350 17 G PO PACK
17.0000 g | PACK | Freq: Every day | ORAL | Status: DC | PRN
  Filled 2020-11-18: qty 1

## 2020-11-18 MED ORDER — HYDROCODONE-ACETAMINOPHEN 5-325 MG PO TABS: 1.0000 | ORAL_TABLET | ORAL | Status: DC | PRN

## 2020-11-18 MED ORDER — ACETAMINOPHEN 650 MG RE SUPP: 650.0000 mg | Freq: Four times a day (QID) | RECTAL | Status: DC | PRN

## 2020-11-18 MED ORDER — ACETAMINOPHEN 325 MG PO TABS: 650.0000 mg | ORAL_TABLET | Freq: Four times a day (QID) | ORAL | Status: DC | PRN

## 2020-11-18 MED ORDER — LIVING WELL WITH DIABETES BOOK
Freq: Once | Status: AC
  Administered 2020-11-18: 1
  Filled 2020-11-18: qty 1

## 2020-11-18 MED ORDER — INFLUENZA VAC A&B SA ADJ QUAD 0.5 ML IM PRSY
0.5000 mL | PREFILLED_SYRINGE | INTRAMUSCULAR | Status: AC
  Administered 2020-11-19: 0.5 mL via INTRAMUSCULAR
  Filled 2020-11-18 (×3): qty 0.5

## 2020-11-18 MED ORDER — AMLODIPINE BESYLATE 5 MG PO TABS
5.0000 mg | ORAL_TABLET | Freq: Every day | ORAL | 1 refills | Status: DC
Start: 2020-11-19 — End: 2020-11-26

## 2020-11-18 MED ORDER — INSULIN GLARGINE-YFGN 100 UNIT/ML ~~LOC~~ SOLN
8.0000 [IU] | Freq: Two times a day (BID) | SUBCUTANEOUS | Status: DC
  Administered 2020-11-18 – 2020-11-21 (×6): 8 [IU] via SUBCUTANEOUS
  Filled 2020-11-18 (×7): qty 0.08

## 2020-11-18 MED ORDER — ENOXAPARIN SODIUM 40 MG/0.4ML IJ SOSY
40.0000 mg | PREFILLED_SYRINGE | Freq: Every day | INTRAMUSCULAR | Status: DC
  Administered 2020-11-19 – 2020-11-26 (×8): 40 mg via SUBCUTANEOUS
  Filled 2020-11-18 (×8): qty 0.4

## 2020-11-18 MED ORDER — BLOOD PRESSURE CONTROL BOOK
Freq: Once | Status: AC
  Administered 2020-11-18: 1
  Filled 2020-11-18: qty 1

## 2020-11-18 MED ORDER — CLOPIDOGREL BISULFATE 75 MG PO TABS: 75.0000 mg | ORAL_TABLET | Freq: Every day | ORAL | 0 refills | Status: DC

## 2020-11-18 MED ORDER — BISACODYL 5 MG PO TBEC
5.0000 mg | DELAYED_RELEASE_TABLET | Freq: Every day | ORAL | Status: DC | PRN
  Administered 2020-11-18 – 2020-11-24 (×2): 5 mg via ORAL
  Filled 2020-11-18 (×2): qty 1

## 2020-11-18 MED ORDER — ASPIRIN 81 MG PO TBEC: 81.0000 mg | DELAYED_RELEASE_TABLET | Freq: Every day | ORAL | 11 refills | Status: DC

## 2020-11-18 MED ORDER — LOSARTAN POTASSIUM 25 MG PO TABS: 25.0000 mg | ORAL_TABLET | Freq: Every day | ORAL | 0 refills | Status: DC

## 2020-11-18 MED ORDER — INSULIN ASPART 100 UNIT/ML IJ SOLN
0.0000 [IU] | Freq: Three times a day (TID) | INTRAMUSCULAR | Status: DC
  Administered 2020-11-18: 3 [IU] via SUBCUTANEOUS
  Administered 2020-11-19: 2 [IU] via SUBCUTANEOUS
  Administered 2020-11-19: 8 [IU] via SUBCUTANEOUS
  Administered 2020-11-20: 11 [IU] via SUBCUTANEOUS
  Administered 2020-11-20 – 2020-11-21 (×2): 3 [IU] via SUBCUTANEOUS
  Administered 2020-11-21: 11 [IU] via SUBCUTANEOUS
  Administered 2020-11-22 – 2020-11-23 (×2): 8 [IU] via SUBCUTANEOUS
  Administered 2020-11-23: 2 [IU] via SUBCUTANEOUS
  Administered 2020-11-24: 3 [IU] via SUBCUTANEOUS
  Administered 2020-11-24: 5 [IU] via SUBCUTANEOUS
  Administered 2020-11-25: 3 [IU] via SUBCUTANEOUS
  Administered 2020-11-25: 5 [IU] via SUBCUTANEOUS

## 2020-11-18 MED ORDER — ATORVASTATIN CALCIUM 40 MG PO TABS
40.0000 mg | ORAL_TABLET | Freq: Every day | ORAL | Status: DC
  Administered 2020-11-19 – 2020-11-26 (×8): 40 mg via ORAL
  Filled 2020-11-18 (×8): qty 1

## 2020-11-18 MED ORDER — DORZOLAMIDE HCL 2 % OP SOLN
1.0000 [drp] | Freq: Two times a day (BID) | OPHTHALMIC | Status: DC
  Administered 2020-11-18 – 2020-11-26 (×16): 1 [drp] via OPHTHALMIC
  Filled 2020-11-18: qty 10

## 2020-11-18 MED ORDER — PREDNISONE 20 MG PO TABS
60.0000 mg | ORAL_TABLET | Freq: Every day | ORAL | Status: DC
  Administered 2020-11-19 – 2020-11-21 (×3): 60 mg via ORAL
  Filled 2020-11-18 (×3): qty 3

## 2020-11-18 MED ORDER — ONDANSETRON HCL 4 MG/2ML IJ SOLN: 4.0000 mg | Freq: Four times a day (QID) | INTRAMUSCULAR | Status: DC | PRN

## 2020-11-18 MED ORDER — ASPIRIN EC 81 MG PO TBEC
81.0000 mg | DELAYED_RELEASE_TABLET | Freq: Every day | ORAL | Status: DC
  Administered 2020-11-19 – 2020-11-26 (×8): 81 mg via ORAL
  Filled 2020-11-18 (×8): qty 1

## 2020-11-18 MED ORDER — ATORVASTATIN CALCIUM 40 MG PO TABS: 40.0000 mg | ORAL_TABLET | Freq: Every day | ORAL | 1 refills | Status: DC

## 2020-11-18 MED ORDER — CLOPIDOGREL BISULFATE 75 MG PO TABS
75.0000 mg | ORAL_TABLET | Freq: Every day | ORAL | Status: DC
  Administered 2020-11-19 – 2020-11-26 (×8): 75 mg via ORAL
  Filled 2020-11-18 (×8): qty 1

## 2020-11-18 MED ORDER — AMLODIPINE BESYLATE 5 MG PO TABS
5.0000 mg | ORAL_TABLET | Freq: Every day | ORAL | Status: DC
  Administered 2020-11-19 – 2020-11-21 (×3): 5 mg via ORAL
  Filled 2020-11-18 (×4): qty 1

## 2020-11-18 MED ORDER — LOSARTAN POTASSIUM 25 MG PO TABS
25.0000 mg | ORAL_TABLET | Freq: Every day | ORAL | Status: DC
  Administered 2020-11-19 – 2020-11-26 (×8): 25 mg via ORAL
  Filled 2020-11-18 (×8): qty 1

## 2020-11-18 MED ORDER — INSULIN GLARGINE-YFGN 100 UNIT/ML ~~LOC~~ SOLN: 8.0000 [IU] | Freq: Two times a day (BID) | SUBCUTANEOUS | 11 refills | Status: DC

## 2020-11-18 NOTE — Progress Notes (Signed)
Inpatient Rehab Admissions Coordinator:   I have an additional CIR bed today, so Pt. Will transfer today instead of tomorrow. RN may call report to [phone].  Clemens Catholic, [place], [place] Admissions Coordinator  [phone] ([place]) [phone] (office)

## 2020-11-18 NOTE — Progress Notes (Signed)
Report given to 4W

## 2020-11-18 NOTE — Progress Notes (Addendum)
PMR Admission Coordinator Pre-Admission Assessment  Patient: Jenna Long is an 86 y.o., female MRN: [id number] DOB: 07/27/1934 Height: 5' 7" (170.2 cm) Weight: 60 kg  Insurance Information HMO:     PPO: yes     PCP:      IPA:      80/20:      OTHER:  PRIMARY: [employer]      Policy#: [id number]      Subscriber: Pt.  CM Name: Stephanie      Phone#: [phone]     Fax#: [phone] Stephanie with [facility] called 11/16/20 with approval for admission for 7 days.  Pre-Cert#: [id number]      Employer:  Benefits:  Phone #:      Name:  Effff Date: 02/19/2014 - still active Deductible: no deductible ($0) OOP Max: $3,450 ($1,359.94 met) CIR: $325/day co-pay for days 1-6, $0/day days 7-90 SNF:  $0/day co-pay for days 1-20, $184/day co-pay for days 21-100; limited to 100 days/benefit period Outpatient: $30/visit co-pay; limited by medical necessity Home Health:  100% coverage DME: 80% coverage; 20% co-insurance  SECONDARY:  none      Policy#:      Phone#:   Financial Counselor:       Phone#:   The "Data Collection Information Summary" for patients in Inpatient Rehabilitation Facilities with attached "Privacy Act Statement-Health Care Records" was provided and verbally reviewed with: Patient  Emergency Contact Information Contact Information     Name Relation Home Work Mobile   Atkins,Donna Daughter [phone]         Current Medical History  Patient Admitting Diagnosis:Temporal arteritis, CVA History of Present Illness: BBetty B. Long is a 86-year-old right-handed female with history of hypertension, hyperlipidemia, diabetes mellitus and left hip hemiarthroplasty 02/18/2020 after a fall.  Per chart review patient lives alone.  1 level home 3 steps to entry.  Independent with assistive device.  She performs her own grocery shopping and manages medications.  Her granddaughter will be providing assistance on discharge.  Presented 11/07/2020 with right eye vision loss.  She denied any  headache but did have some temporal discomfort over the past 2 weeks since her sinuses were giving her problems.  CT/MRI showed 2 subcentimeter acute infarcts within the right inferior right cerebellar hemisphere.  Signal abnormality within the right vertebral artery at the level of the foramen magnum suggesting high-grade stenosis or vessel occlusion at that site.  Chronic lacunar infarcts within the left cerebral hemispheric white matter bilateral basal ganglia and bilateral thalami.  MRI orbits abnormal enhancement along the optic nerve sheaths bilaterally, compatible with bilateral optic nerve perineuritis.  CT angiogram head and neck severe stenosis of the mid and distal basilar artery and bilateral P2 PCAs.  Severe stenosis of distal M1 and proximal M2 MCA branches bilaterally.  Severe stenosis of the left A2 ACA.  Multifocal severe stenosis of the right vertebral artery in the neck with occlusion at the C2-3 level and irregular opacification of a small V3/V4 vertebral artery from collaterals.  Bilateral carotid bifurcation atherosclerosis without greater than 50% stenosis.  Echocardiogram with ejection fraction of 70 to 75% no wall motion abnormalities grade 1 diastolic dysfunction.  Her admission chemistries were unremarkable except sodium 134 glucose 205, hemoglobin 10, sedimentation rate 111, C-reactive protein 8.6, hemoglobin A1c 6.3.  Neurology follow-up concern for temporal arteriolitis with right temporal artery biopsy completed 11/09/2020 per Dr. Hawken.  She did complete a 3-day course of Solu-Medrol and started on prednisone 60 mg daily..  Biopsy results   confirm diagnosis of temporal arteritis.  Plan is for very slow taper of prednisone decreased to 10 mg every 2 weeks at follow-up with rheumatology as well as neurology.  Patient did have an episode of orthostatic hypotension while standing at the sink 11/11/2020 dropping to 97 and monitored.  She is currently maintained on aspirin 81 mg daily and  Plavix 75 mg day x3 weeks and aspirin alone.  Placed on Lovenox for DVT prophylaxis.  Tolerating a regular consistency diet.    Complete NIHSS TOTAL: 0  Patient's medical record from [facility] has been reviewed by the rehabilitation admission coordinator and physician.  Past Medical History  Past Medical History:  Diagnosis Date   Diabetes mellitus type II    DNR (do not resuscitate) 11/08/2020   Hypercholesteremia    Hypertension     Has the patient had major surgery during 100 days prior to admission? Yes  Family History   family history is not on file.  Current Medications  Current Facility-Administered Medications:    0.9 %  sodium chloride infusion, , Intravenous, Continuous, Eveland, Matthew, PA-C, Last Rate: 10 mL/hr at 11/09/20 0930, Restarted at 11/09/20 1006   acetaminophen (TYLENOL) tablet 650 mg, 650 mg, Oral, Q6H PRN, 650 mg at 11/10/20 0923 **OR** acetaminophen (TYLENOL) suppository 650 mg, 650 mg, Rectal, Q6H PRN, Eveland, Matthew, PA-C   amLODipine (NORVASC) tablet 10 mg, 10 mg, Oral, Daily, Amin, Sumayya, MD, 10 mg at 11/12/20 0812   [COMPLETED] aspirin tablet 325 mg, 325 mg, Oral, Once, 325 mg at 11/08/20 1858 **FOLLOWED BY** aspirin EC tablet 325 mg, 325 mg, Oral, Daily, Eveland, Matthew, PA-C, 325 mg at 11/12/20 0813   atorvastatin (LIPITOR) tablet 40 mg, 40 mg, Oral, Daily, Xu, Jindong, MD, 40 mg at 11/12/20 0813   bisacodyl (DULCOLAX) EC tablet 5 mg, 5 mg, Oral, Daily PRN, Eveland, Matthew, PA-C   [COMPLETED] clopidogrel (PLAVIX) tablet 300 mg, 300 mg, Oral, Once, 300 mg at 11/08/20 1858 **FOLLOWED BY** clopidogrel (PLAVIX) tablet 75 mg, 75 mg, Oral, Daily, Eveland, Matthew, PA-C, 75 mg at 11/12/20 0813   docusate sodium (COLACE) capsule 100 mg, 100 mg, Oral, BID, Eveland, Matthew, PA-C, 100 mg at 11/12/20 0813   dorzolamide (TRUSOPT) 2 % ophthalmic solution 1 drop, 1 drop, Both Eyes, BID, Eveland, Matthew, PA-C, 1 drop at 11/12/20 0817    enoxaparin (LOVENOX) injection 40 mg, 40 mg, Subcutaneous, Daily, Eveland, Matthew, PA-C, 40 mg at 11/12/20 0814   hydrALAZINE (APRESOLINE) injection 5 mg, 5 mg, Intravenous, Q4H PRN, Eveland, Matthew, PA-C   HYDROcodone-acetaminophen (NORCO/VICODIN) 5-325 MG per tablet 1-2 tablet, 1-2 tablet, Oral, Q4H PRN, Eveland, Matthew, PA-C, 1 tablet at 11/10/20 1301   insulin aspart (novoLOG) injection 0-15 Units, 0-15 Units, Subcutaneous, TID WC, Eveland, Matthew, PA-C, 2 Units at 11/12/20 1159   insulin glargine-yfgn (SEMGLEE) injection 10 Units, 10 Units, Subcutaneous, BID, Amin, Sumayya, MD, 10 Units at 11/12/20 0815   losartan (COZAAR) tablet 25 mg, 25 mg, Oral, Daily, Amin, Sumayya, MD, 25 mg at 11/12/20 0813   morphine 2 MG/ML injection 2 mg, 2 mg, Intravenous, Q2H PRN, Eveland, Matthew, PA-C   ondansetron (ZOFRAN) tablet 4 mg, 4 mg, Oral, Q6H PRN **OR** ondansetron (ZOFRAN) injection 4 mg, 4 mg, Intravenous, Q6H PRN, Eveland, Matthew, PA-C, 4 mg at 11/09/20 0807   polyethylene glycol (MIRALAX / GLYCOLAX) packet 17 g, 17 g, Oral, Daily PRN, Eveland, Matthew, PA-C   predniSONE (DELTASONE) tablet 60 mg, 60 mg, Oral, Q breakfast, Amin, Sumayya, MD, 60   mg at 11/12/20 0812   sodium chloride flush (NS) 0.9 % injection 3 mL, 3 mL, Intravenous, Q12H, Eveland, Matthew, PA-C, 3 mL at 11/11/20 2233  Patients Current Diet:  Diet Order             Diet heart healthy/carb modified Room service appropriate? Yes with Assist; Fluid consistency: Thin  Diet effective now                   Precautions / Restrictions Precautions Precautions: Fall, Other (comment) Precaution Comments: monitor for syncopal symptoms Restrictions Weight Bearing Restrictions: No   Has the patient had 2 or more falls or a fall with injury in the past year? Yes  Prior Activity Level Limited Community (1-2x/wk): Pt. went out 1x a week  Prior Functional Level Self Care: Did the patient need help bathing, dressing, using the  toilet or eating? Independent  Indoor Mobility: Did the patient need assistance with walking from room to room (with or without device)? Independent  Stairs: Did the patient need assistance with internal or external stairs (with or without device)? Independent  Functional Cognition: Did the patient need help planning regular tasks such as shopping or remembering to take medications? Independent  Patient Information Are you of Hispanic, Latino/a,or Spanish origin?: A. No, not of Hispanic, Latino/a, or Spanish origin What is your race?: A. White Do you need or want an interpreter to communicate with a doctor or health care staff?: 0. No  Patient's Response To:  Health Literacy and Transportation Is the patient able to respond to health literacy and transportation needs?: No  Home Assistive Devices / Equipment Home Assistive Devices/Equipment: Eyeglasses, Cane (specify quad or straight), Other (Comment), CBG Meter (gripper to get clothes on) Home Equipment: Walker - 2 wheels, Bedside commode, Cane - single point, Shower seat, Wheelchair - manual, Grab bars - tub/shower  Prior Device Use: Indicate devices/aids used by the patient prior to current illness, exacerbation or injury? None of the above  Current Functional Level Cognition  Overall Cognitive Status:  (family present but not commenting on pt) Current Attention Level: Selective Orientation Level: Oriented X4 Following Commands: Follows one step commands inconsistently, Follows one step commands with increased time Safety/Judgement: Decreased awareness of safety, Decreased awareness of deficits General Comments: pt is clearly limited with standing tolerance and unabel to get to chair this AM, but asking when she is leaving for home    Extremity Assessment (includes Sensation/Coordination)  Upper Extremity Assessment: Generalized weakness  Lower Extremity Assessment: Defer to PT evaluation RLE Coordination: decreased gross motor,  decreased fine motor LLE Coordination: decreased fine motor, decreased gross motor    ADLs  Overall ADL's : Needs assistance/impaired Eating/Feeding: Set up, Sitting Eating/Feeding Details (indicate cue type and reason): assist to open OJ cup Grooming: Set up, Sitting, Wash/dry face Grooming Details (indicate cue type and reason): seated at sink Upper Body Bathing: Set up, Sitting Upper Body Bathing Details (indicate cue type and reason): to wash under arms Lower Body Bathing: Moderate assistance, Sit to/from stand Lower Body Bathing Details (indicate cue type and reason): able to stand and wash anterior/posterior region. provided linen to dry self with beginning of syncopal episode emerging Lower Body Dressing: Moderate assistance, Sit to/from stand Lower Body Dressing Details (indicate cue type and reason): assist to doff/don brief around posterior region Toilet Transfer: Minimal assistance, Stand-pivot, RW Toilet Transfer Details (indicate cue type and reason): via handheld assist, noted unsteadiness but able to problem solve best hand placement Toileting- Clothing   Manipulation and Hygiene: Moderate assistance, Sitting/lateral lean, Sit to/from stand Toileting - Clothing Manipulation Details (indicate cue type and reason): unable to urinate as pt thought on BSC, assist to doff/don brief around bottom General ADL Comments: Pt with noted unsteadiness in standing even with cane use, also with sudden onset of syncopal episode during ADLs at sink. Pt at increased risk for falls along with impaired R dominant vision    Mobility  Overal bed mobility: Needs Assistance Bed Mobility: Supine to Sit, Sit to Supine Supine to sit: Min assist Sit to supine: Mod assist General bed mobility comments: mod assist with vc's for sequence to return to bed    Transfers  Overall transfer level: Needs assistance Equipment used: 1 person hand held assist, Rolling walker (2 wheeled) (CNA standing  by) Transfers: Sit to/from Stand Sit to Stand: Min assist Stand pivot transfers: Min assist, Total assist, +2 safety/equipment, +2 physical assistance General transfer comment: min assist with cues for hand placement and sequence    Ambulation / Gait / Stairs / Wheelchair Mobility  Ambulation/Gait Ambulation/Gait assistance: Min assist Gait Distance (Feet): 20 Feet Assistive device: Straight cane Gait Pattern/deviations: Step-to pattern, Decreased stride length, Ataxic, Wide base of support, Trunk flexed General Gait Details: pt deferred due to light headed feelings Gait velocity: decreased    Posture / Balance Balance Overall balance assessment: Needs assistance, History of Falls Sitting-balance support: Feet supported Sitting balance-Leahy Scale: Fair Standing balance support: Bilateral upper extremity supported, During functional activity Standing balance-Leahy Scale: Poor Standing balance comment: requires walker and holding pt due to recent near syncopal episode    Special needs/care consideration Skin surgical incision to the temple Diabetic Management: Novolog sQ 0-15 units 3x. Day with meals. Semglee sQ8 units 2x/day   Previous Home Environment (from acute therapy documentation) Living Arrangements: Alone  Lives With: Alone Available Help at Discharge: Family, Available PRN/intermittently Type of Home: House Home Layout: One level Home Access: Stairs to enter Entrance Stairs-Rails: Right, Left, Can reach both Entrance Stairs-Number of Steps: 3 Bathroom Shower/Tub: Tub/shower unit Bathroom Toilet: Handicapped height Home Care Services: No Additional Comments: bathes at sink due to tub being too tall to step over  Discharge Living Setting Plans for Discharge Living Setting: Patient's home Type of Home at Discharge: House Discharge Home Layout: One level Discharge Home Access: Stairs to enter Entrance Stairs-Rails: Right, Left, Can reach both Entrance Stairs-Number  of Steps: 3 Discharge Bathroom Shower/Tub: Tub/shower unit Discharge Bathroom Toilet: Handicapped height Discharge Bathroom Accessibility: Yes How Accessible: Accessible via walker, Accessible via wheelchair Does the patient have any problems obtaining your medications?: No  Social/Family/Support Systems Patient Roles: Other (Comment) Contact Information: [phone] Anticipated Caregiver: Donna Adkins (daughter) Anticipated Caregiver's Contact Information: [phone] Ability/Limitations of Caregiver: Can do min A Caregiver Availability: 24/7 Discharge Plan Discussed with Primary Caregiver: Yes Is Caregiver In Agreement with Plan?: Yes Does Caregiver/Family have Issues with Lodging/Transportation while Pt is in Rehab?: No  Goals Patient/Family Goal for Rehab: PT/OT Supervision Expected length of stay: 5-7 days Pt/Family Agrees to Admission and willing to participate: Yes Program Orientation Provided & Reviewed with Pt/Caregiver Including Roles  & Responsibilities: Yes  Decrease burden of Care through IP rehab admission: Specialzed equipment needs, Decrease number of caregivers, Bowel and bladder program, and Patient/family education  Possible need for SNF placement upon discharge: not anticipated   Patient Condition: I have reviewed medical records from [facility], spoken with CM, and patient. I met with patient at the bedside for inpatient   rehabilitation assessment.  Patient will benefit from ongoing PT and OT, can actively participate in 3 hours of therapy a day 5 days of the week, and can make measurable gains during the admission.  Patient will also benefit from the coordinated team approach during an Inpatient Acute Rehabilitation admission.  The patient will receive intensive therapy as well as Rehabilitation physician, nursing, social worker, and care management interventions.  Due to safety, skin/wound care, disease management, medication administration, pain  management, and patient education the patient requires 24 hour a day rehabilitation nursing.  The patient is currently min-mod A with mobility and basic ADLs.  Discharge setting and therapy post discharge at home with home health is anticipated.  Patient has agreed to participate in the Acute Inpatient Rehabilitation Program and will admit Saturday, 11/19/20.  Preadmission Screen Completed By:  Mika Griffitts B Salil Raineri, 11/12/2020 2:18 PM ______________________________________________________________________   Discussed status with Dr. Patel on 9/30 at 9:30 and received approval for admission today.  Admission Coordinator:  Dartanion Teo B Brantley Wiley, CCC-SLP, time 1100/Date 11/19/2020   Assessment/Plan: Diagnosis:  Right cerebellar infarct  Does the need for close, 24 hr/day Medical supervision in concert with the patient's rehab needs make it unreasonable for this patient to be served in a less intensive setting? Yes Co-Morbidities requiring supervision/potential complications: temporal arteritis, diabetes, hypertension Due to bladder management, bowel management, safety, skin/wound care, disease management, medication administration, pain management, and patient education, does the patient require 24 hr/day rehab nursing? Yes Does the patient require coordinated care of a physician, rehab nurse, PT, OT, and SLP to address physical and functional deficits in the context of the above medical diagnosis(es)? Yes Addressing deficits in the following areas: balance, endurance, locomotion, strength, transferring, bowel/bladder control, bathing, dressing, feeding, grooming, toileting, cognition, and psychosocial support Can the patient actively participate in an intensive therapy program of at least 3 hrs of therapy 5 days a week? Yes The potential for patient to make measurable gains while on inpatient rehab is good Anticipated functional outcomes upon discharge from inpatient rehab: supervision and min assist PT, supervision  and min assist OT, modified independent and supervision SLP Estimated rehab length of stay to reach the above functional goals is: 10-14 days. Anticipated discharge destination: Home 10. Overall Rehab/Functional Prognosis: good   MD Signature: Ankit Patel, MD, ABPMR   

## 2020-11-18 NOTE — H&P (Signed)
Physical Medicine and Rehabilitation Admission H&P    Chief Complaint  Patient presents with   Loss of Vision  : HPI: Jenna Long is a 86 year old right-handed female with history of hypertension, hyperlipidemia, diabetes mellitus and left hip hemiarthroplasty 02/18/2020 after a fall.  History taken from chart review patient.  Patient lives alone.  1 level home 3 steps to entry.  Independent with assistive device.  She performs her own grocery shopping and manages medications.  Her granddaughter will be providing assistance on discharge.  She presented on 11/07/20 with vision right eye.  She denied any headache but did have some temporal discomfort x 2 weeks since her sinuses were giving her problems.  CT/MRI showed 2 subcentimeter acute infarcts within the right inferior cerebellar hemisphere.  Signal abnormality within the right vertebral artery at the level of the foramen magnum suggesting high-grade stenosis or vessel occlusion at that site.  Chronic lacunar infarcts within the left cerebral hemispheric white matter bilateral basal ganglia and bilateral thalami.  MRI orbits abnormal enhancement along the optic nerve sheaths bilaterally, compatible with bilateral optic nerve perineuritis.  CT angiogram head and neck severe stenosis of the mid and distal basilar artery and bilateral P2 PCAs.  Severe stenosis of distal M1 and proximal M2 MCA branches bilaterally.  Severe stenosis of the left A2 ACA.  Multifocal severe stenosis of the right vertebral artery in the neck with occlusion at the C2-3 level and irregular opacification of a small V3/V4 vertebral artery from collaterals.  Bilateral carotid bifurcation atherosclerosis without greater than 50% stenosis.  Echocardiogram with ejection fraction of 70-75%, no wall motion abnormalities grade 1 diastolic dysfunction.  Her admission chemistries were unremarkable except sodium 134 glucose 205, hemoglobin 10, sedimentation rate 111, C-reactive protein  8.6, hemoglobin A1c 6.3.  Neurology follow-up concern for temporal arteriolitis with right temporal artery biopsy completed 11/09/2020 per Dr. Stanford Breed.  She did complete a 3-day course of Solu-Medrol and started on prednisone 60 mg daily..  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  Plan is for very slow taper of prednisone decreased to 10 mg every 2 weeks at follow-up with rheumatology as well as neurology.  Patient did have an episode of orthostatic hypotension while standing at the sink 11/11/2020 dropping to 97 and monitored.  She is currently maintained on aspirin 81 mg daily and Plavix 75 mg day x3 weeks and aspirin alone.  Placed on Lovenox for DVT prophylaxis.  Tolerating a regular consistency diet.  Due to patient decreased functional ability right eye vision loss patient was admitted for a comprehensive rehab program.  Patient with resulting functional deficits with mobility, self-care.  Please see preadmission assessment from earlier today as well.  Review of Systems  Constitutional:  Negative for chills and fever.  HENT:  Negative for hearing loss.   Eyes:        Right eye intermittent vision loss  Respiratory:  Negative for cough and shortness of breath.   Cardiovascular:  Negative for chest pain, palpitations and leg swelling.  Gastrointestinal:  Positive for constipation. Negative for heartburn, nausea and vomiting.  Genitourinary:  Negative for dysuria, flank pain and hematuria.  Musculoskeletal:  Positive for joint pain and myalgias.  Skin:  Negative for rash.  Neurological:  Positive for weakness.       Temporal headache  All other systems reviewed and are negative. Past Medical History:  Diagnosis Date   Diabetes mellitus type II    DNR (do not resuscitate) 11/08/2020  Hypercholesteremia    Hypertension    Past Surgical History:  Procedure Laterality Date   ABDOMINAL HYSTERECTOMY  1991   [facility], [place]   left, [place]   ARTERY BIOPSY Right  11/09/2020   Procedure: BIOPSY TEMPORAL ARTERY;  Surgeon: Cherre Robins, MD;  Location: [facility];  Service: Vascular;  Laterality: Right;   CATARACT EXTRACTION W/ INTRAOCULAR LENS IMPLANT  06/10/06    Right, [facility], [place]   CATARACT EXTRACTION W/PHACO  09/18/2010   Procedure: CATARACT EXTRACTION PHACO AND INTRAOCULAR LENS PLACEMENT ([place]);  Surgeon: Williams Che;  Location: [facility];  Service: Ophthalmology;  Laterality: Left;   COLONOSCOPY W/ POLYPECTOMY  06/20/10   [facility], [place]   HIP ARTHROPLASTY Left 02/18/2020   Procedure: ARTHROPLASTY BIPOLAR HIP (HEMIARTHROPLASTY);  Surgeon: Mordecai Rasmussen, MD;  Location: [facility];  Service: Orthopedics;  Laterality: Left;   Family History  Problem Relation Age of Onset   Anesthesia problems Neg Hx    Social History:  reports that she has never smoked. She has never used smokeless tobacco. She reports that she does not drink alcohol and does not use drugs. Allergies: No Known Allergies Medications Prior to Admission  Medication Sig Dispense Refill   acetaminophen (TYLENOL) 325 MG tablet Take 325-650 mg by mouth every 6 (six) hours as needed for moderate pain or headache.     amLODipine (NORVASC) 5 MG tablet Take 5 mg by mouth daily.     dorzolamide (TRUSOPT) 2 % ophthalmic solution Place 1 drop into both eyes 2 (two) times daily.     JANUVIA 100 MG tablet Take 100 mg by mouth daily.     metFORMIN (GLUCOPHAGE) 1000 MG tablet Take 1,000 mg by mouth 2 (two) times daily with a meal.     Multiple Vitamin (MULTIVITAMIN) tablet Take 1 tablet by mouth daily.     pravastatin (PRAVACHOL) 40 MG tablet Take 40 mg by mouth at bedtime.      Drug Regimen Review Drug regimen was reviewed and remains appropriate with no significant issues identified  Home: Home Living Family/patient expects to be discharged to:: Private residence Living Arrangements: Alone Available Help at Discharge: Family, Available PRN/intermittently Type of Home: House Home Access: Stairs to  enter [employer] of Steps: 3 Entrance Stairs-Rails: Right, Left, Can reach both Home Layout: One level Bathroom Shower/Tub: Chiropodist: Handicapped height Home Equipment: Environmental consultant - 2 wheels, Bedside commode, Cane - single point, Shower seat, Wheelchair - manual, Grab bars - tub/shower Additional Comments: bathes at sink due to tub being too tall to step over  Lives With: Alone   Functional History: Prior Function Level of Independence: Independent with assistive device(s) Comments: Patient states independent with ADL and community ambulator with [place]. Performs grocery shopping, manages own medications. Patient states granddaughter will be coming to stay with her at dischage. Reports one fall 9 months ago resulting in hip fx (said she fell outside, crawled to porch and stayed there all night)  Functional Status:  Mobility: Bed Mobility Overal bed mobility: Needs Assistance Bed Mobility: Supine to Sit Supine to sit: HOB elevated, Mod assist Sit to supine: Supervision, HOB elevated General bed mobility comments: ModA for HHA to elevate trunk Transfers Overall transfer level: Needs assistance Equipment used: Rolling walker (2 wheeled) Transfers: Sit to/from Stand Sit to Stand: Min assist, Mod assist Stand pivot transfers: Min assist, +2 safety/equipment General transfer comment: Pt requiring multiple attempts for initial stand from EOB to RW, ultimately  requiring minA for trunk elevation and stability; additional trial standing from bed with SPC, reliant on modA to prevent posterior LOB Ambulation/Gait Ambulation/Gait assistance: Min assist, Mod assist Gait Distance (Feet): 20 Feet (+80+40) Assistive device: Rolling walker (2 wheeled), Straight cane Gait Pattern/deviations: Decreased stride length, Shuffle, Step-through pattern, Trunk flexed General Gait Details: Initial bouts of in-room and hallway ambulation with RW and minA for stability, seated rest  after performing ADL tasks at sink; additional gait trial with pt's cane, pt requiring up to modA to prevent LOB, endorses "my (left) leg feels like it's giving out" Gait velocity: Decreased    ADL: ADL Overall ADL's : Needs assistance/impaired Eating/Feeding: Set up, Sitting Eating/Feeding Details (indicate cue type and reason): assist to open OJ cup Grooming: Set up, Sitting, Wash/dry face Grooming Details (indicate cue type and reason): seated at sink Upper Body Bathing: Set up, Sitting Upper Body Bathing Details (indicate cue type and reason): to wash under arms Lower Body Bathing: Minimal assistance, Sit to/from stand Lower Body Bathing Details (indicate cue type and reason): able to bathe peri region with min guard to maintain balance. Difficulty reaching feet Lower Body Dressing: Maximal assistance, Sit to/from stand Lower Body Dressing Details (indicate cue type and reason): Max A to don around feet, cues to assist around waist. Pt daughter present reports pt uses reacher to get clothing around feet at home Toilet Transfer: Minimal assistance, Stand-pivot Toilet Transfer Details (indicate cue type and reason): pt declining use of a RW this session; min A for boosting and verbal cues for transfer without RW. pt had good hand placement and demonstrated goo dsafety with transfer Toileting- Clothing Manipulation and Hygiene: Moderate assistance, Sitting/lateral lean, Sit to/from stand Toileting - Clothing Manipulation Details (indicate cue type and reason): unable to urinate as pt thought on BSC, assist to doff/don brief around bottom Functional mobility during ADLs: Minimal assistance, Rolling walker General ADL Comments: Pt noted with increased impulsivity and decreased safety awareness. Entering as pt sitting EOB and reporting desire to transfer to chair (bed alarm sounding) with daughter present. Encouragement needed to change soiled brief (daughter also encouraged) with pt agreeable.  No drops in BP noted. Pt reports vision issues still present but too distractible to attend to tasks  Cognition: Cognition Overall Cognitive Status: Impaired/Different from baseline Orientation Level: Oriented X4 Cognition Arousal/Alertness: Awake/alert Behavior During Therapy: WFL for tasks assessed/performed, Impulsive Overall Cognitive Status: Impaired/Different from baseline Area of Impairment: Safety/judgement, Problem solving, Following commands, Awareness, Attention Current Attention Level: Selective Following Commands: Follows one step commands consistently, Follows multi-step commands inconsistently Safety/Judgement: Decreased awareness of safety, Decreased awareness of deficits Awareness: Emergent Problem Solving: Slow processing, Requires tactile cues, Requires verbal cues General Comments: Pt very conversant and distractable during session; looks for frequent positive reinforcement related to mobility. Cues for safety awareness, difficulty multitasking  Physical Exam: Blood pressure (!) 158/76, pulse (!) 57, temperature 97.6 F (36.4 C), temperature source Oral, resp. rate 18, height 5\' 7"  (1.702 m), weight 60 kg, SpO2 100 %. Physical Exam Vitals reviewed.  Constitutional:      General: She is not in acute distress.    Appearance: Normal appearance. She is not ill-appearing.  HENT:     Head: Normocephalic.     Comments: Right chest wall incision healing    Nose: Nose normal.  Eyes:     General:        Right eye: No discharge.        Left eye: No discharge.  Extraocular Movements: Extraocular movements intact.     Comments: Patient with complete vision loss on the right.  Cardiovascular:     Rate and Rhythm: Normal rate and regular rhythm.  Pulmonary:     Effort: Pulmonary effort is normal. No respiratory distress.     Breath sounds: No stridor.  Abdominal:     General: Abdomen is flat. Bowel sounds are normal. There is no distension.  Musculoskeletal:      Cervical back: Normal range of motion and neck supple.     Comments: No edema or tenderness in extremities  Skin:    General: Skin is warm and dry.     Comments: Right temporal area with dried blood  Neurological:     Mental Status: She is alert.     Comments: Alert and oriented x3 Some difficulty following two-step commands Motor: Appears to be 4+/5 throughout, however some difficulty following commands  Psychiatric:        Mood and Affect: Mood normal.        Behavior: Behavior normal.     Comments: Appears slightly confused    Results for orders placed or performed during the hospital encounter of 11/07/20 (from the past 48 hour(s))  Glucose, capillary     Status: None   Collection Time: 11/16/20  6:19 AM  Result Value Ref Range   Glucose-Capillary 82 70 - 99 mg/dL    Comment: Glucose reference range applies only to samples taken after fasting for at least 8 hours.  Glucose, capillary     Status: Abnormal   Collection Time: 11/16/20 12:08 PM  Result Value Ref Range   Glucose-Capillary 265 (H) 70 - 99 mg/dL    Comment: Glucose reference range applies only to samples taken after fasting for at least 8 hours.  Glucose, capillary     Status: Abnormal   Collection Time: 11/16/20  4:42 PM  Result Value Ref Range   Glucose-Capillary 221 (H) 70 - 99 mg/dL    Comment: Glucose reference range applies only to samples taken after fasting for at least 8 hours.  Glucose, capillary     Status: Abnormal   Collection Time: 11/16/20  8:59 PM  Result Value Ref Range   Glucose-Capillary 177 (H) 70 - 99 mg/dL    Comment: Glucose reference range applies only to samples taken after fasting for at least 8 hours.  Basic metabolic panel     Status: Abnormal   Collection Time: 11/17/20  1:43 AM  Result Value Ref Range   Sodium 137 135 - 145 mmol/L   Potassium 4.1 3.5 - 5.1 mmol/L   Chloride 104 98 - 111 mmol/L   CO2 25 22 - 32 mmol/L   Glucose, Bld 102 (H) 70 - 99 mg/dL    Comment: Glucose  reference range applies only to samples taken after fasting for at least 8 hours.   BUN 18 8 - 23 mg/dL   Creatinine, Ser 0.65 0.44 - 1.00 mg/dL   Calcium 8.7 (L) 8.9 - 10.3 mg/dL   GFR, Estimated >60 >60 mL/min    Comment: (NOTE) Calculated using the CKD-EPI Creatinine Equation (2021)    Anion gap 8 5 - 15    Comment: Performed at [facility] [address], [place], [place] [address]  Glucose, capillary     Status: Abnormal   Collection Time: 11/17/20  6:47 AM  Result Value Ref Range   Glucose-Capillary 68 (L) 70 - 99 mg/dL    Comment: Glucose reference  range applies only to samples taken after fasting for at least 8 hours.  Glucose, capillary     Status: Abnormal   Collection Time: 11/17/20  7:21 AM  Result Value Ref Range   Glucose-Capillary 111 (H) 70 - 99 mg/dL    Comment: Glucose reference range applies only to samples taken after fasting for at least 8 hours.  Glucose, capillary     Status: Abnormal   Collection Time: 11/17/20 11:29 AM  Result Value Ref Range   Glucose-Capillary 196 (H) 70 - 99 mg/dL    Comment: Glucose reference range applies only to samples taken after fasting for at least 8 hours.  Glucose, capillary     Status: Abnormal   Collection Time: 11/17/20  4:01 PM  Result Value Ref Range   Glucose-Capillary 256 (H) 70 - 99 mg/dL    Comment: Glucose reference range applies only to samples taken after fasting for at least 8 hours.  Glucose, capillary     Status: Abnormal   Collection Time: 11/17/20  8:42 PM  Result Value Ref Range   Glucose-Capillary 245 (H) 70 - 99 mg/dL    Comment: Glucose reference range applies only to samples taken after fasting for at least 8 hours.  Glucose, capillary     Status: Abnormal   Collection Time: 11/17/20  9:01 PM  Result Value Ref Range   Glucose-Capillary 234 (H) 70 - 99 mg/dL    Comment: Glucose reference range applies only to samples taken after fasting for at least 8 hours.   Comment 1 Notify RN    Comment 2  Document in Chart    No results found.     Medical Problem List and Plan: 1.  Deficits with mobility, transfers, vision secondary to right temporal arteritis secondary to severe right vertebral artery stenosis. PLAN PREDNISONE TAPER BY 10 MG EVERY 2-WEEK AND FOLLOW UP NEUROLOGY/RHEUMATOLOGY  -patient may shower  -ELOS/Goals: 13-16 days/supervision/min a  Admit to CIR 2.  Antithrombotics: -DVT/anticoagulation:  Pharmaceutical: Lovenox  -antiplatelet therapy: Aspirin 81 mg daily and Plavix 75 mg day x3 weeks then aspirin alone 3. Pain Management: Hydrocodone as needed 4. Mood: Provide emotional support  -antipsychotic agents: N/A 5. Neuropsych: This patient is capable of making decisions on her own behalf. 6. Skin/Wound Care: Routine skin checks 7. Fluids/Electrolytes/Nutrition: Routine in and outs  CMP ordered 8.  Hypertension.  Norvasc 5 mg daily, Cozaar 25 mg daily.   Monitor with increased mobility  9.  Hyperlipidemia for Lipitor 10.  Diabetes mellitus.  Hemoglobin A1c 6.3.  Semglee 8 units twice daily  Monitor with increased mobility 11.  Right cerebellar infarct  Cathlyn Parsons, PA-C 11/18/2020  I have personally performed a face to face diagnostic evaluation, including, but not limited to relevant history and physical exam findings, of this patient and developed relevant assessment and plan.  Additionally, I have reviewed and concur with the physician assistant's documentation above.  Delice Lesch, MD, ABPMR  The patient's status has not changed. Any changes from the pre-admission screening or documentation from the acute chart are noted above.   Delice Lesch, MD, ABPMR

## 2020-11-18 NOTE — Plan of Care (Signed)

## 2020-11-18 NOTE — Discharge Summary (Signed)
Physician Discharge Summary  Jenna Long LNL:[id number] DOB: 12-17-1934 DOA: 11/07/2020  PCP: Sharilyn Sites, MD  Admit date: 11/07/2020.  Discharge date: 11/18/2020.   Admitted From:  Home.  Disposition:   Inpatient Rehab.  Recommendations for Outpatient Follow-up:  Follow up with PCP in 1-2 weeks. Please obtain BMP/CBC in one week. Patient is admitted for temporal arteritis.  Patient is discharged to inpatient rehab. Advised to take prednisone and continue for slow taper over 4- 6 weeks. Follow-up outpatient rheumatologist. Advised to continue aspirin and Plavix for 21 days followed by Aspirin only.  Home Health: None. Equipment/Devices: None  Discharge Condition: Stable. CODE STATUS: DNR Diet recommendation: Heart Healthy   Brief Summary / Hospital Course: This 86 years old female with PMH significant for hypertension, hyperlipidemia, diabetes presented with complaints of right eye vision loss. Patient reported temporal discomfort for about 2 weeks following sinus infection. She was found to have elevated inflammatory markers. MRI consistent with new small right cerebellar infarct. Patient underwent right temporal artery biopsy and she was started on high-dose steroids for 3 days.  Biopsy result confirms the diagnosis of temporal arteritis.  Patient still reports no vision in the right, this could be permanent vision loss , since the symptoms started approximately 2 weeks ago.  Patient was seen by neurology who recommended aspirin and Plavix for 3 weeks followed by aspirin only.  With gradual tapering of his steroids,  Patient has seen some improvement in her right-sided vision.  Patient needs outpatient Neurology and rheumatology appointments.  Patient is medically stable. Patient is being discharged to inpatient rehab.  She was managed for below problems.  Discharge Diagnoses:  Principal Problem:   Temporal arteritis ([place]) Active Problems:   Essential hypertension   Mixed  hyperlipidemia   Diabetes mellitus type 2 in nonobese (HCC)   DNR (do not resuscitate)   Cerebral thrombosis with cerebral infarction   Vision loss of right eye   Dyslipidemia   Controlled type 2 diabetes mellitus with complication, with long-term current use of insulin (HCC)  Right-sided vision loss could be secondary to temporal arteritis : Patient reports subacute temporal pain following sinus infection. Found to have elevated ESR and CRP There was a concern for giant cell arteritis versus ischemic optic neuropathy Temporal artery biopsy results positive for temporal arteritis. Vascular surgery was reconsulted due to presyncopal episodes with standing, they are recommending medical management as there is no significant cervical vascular stenosis. 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  2 small acute infarcts involving the inferior right cerebellar hemisphere.  Chronic lacunar infarcts. Arterial duplex of temporal arteries positive for halo sign on right which is consistent with temporal arteritis.  Completed a 3-day course of high-dose steroid. She is started on on 60 mg of prednisone daily, She will need very slow taper over the course of many weeks in the future.  Decrease 10 mg every 2-week.  She will also need a close follow-up by PCP and vascular surgery.  She will also need follow-up with rheumatology and neurology. Patient reports some improvement in her right-sided vision. Continue Trusopt in B eyes for now (glaucoma). Patient will need outpatient ophthalmology evaluation.   Recurrent syncope: This could be vasovagal but patient do have multiple severe stenosis involving both intra and extracranial vessels.  Small decrease in blood pressure can decrease the perfusion of brain which can become symptomatic. No other focal deficit.  Patient reports slight improvement in her right vision. Neurology was reconsulted due to her complaint of  transient loss of vision in left  eye. Reimaging without any new changes.  Orthostatic vitals positive.  Vascular surgery is recommending medical management only. Continue Compression stockings. PT recommended CIR and patient is approved.  Acute small inferior right cerebellar infarcts.   CTA with moderate to severe stenosis involving multiple vessels which include basilar, bilateral P2 PCAs.  Severe stenosis of M1 and M2 of MCA bilaterally.  Severe stenosis of left ACA.  Severe stenosis of right vertebral artery and moderate stenosis of left vertebral artery.  Moderate stenosis of carotid bifurcation. Lipid panel with LDL of 100, goal of less than 70. Continue with statin, aspirin and Plavix..   Type 2 diabetes mellitus.   Hemoglobin A1c 6.3 which is well controlled. CBC currently within goal after increasing basal. Continue long-acting to 10 units twice daily. Continue with SSI   Hypertension: Blood pressure improving after addition of losartan.  Due to multiple intra and extracranial vascular stenosis she is very sensitive to low blood pressure, per neurology which should allow mild permissive hypertension all the time.  Keep systolic around 876 might be helpful. Decrease the dose of amlodipine to 5 mg daily. Continue losartan and monitor.  Discharge Instructions  Discharge Instructions     Ambulatory referral to Neurology   Complete by: As directed    Follow up with Dr. Leonie Man at [facility] in 4 weeks. Too complicated for NP to follow. Thanks.   Call MD for:  difficulty breathing, headache or visual disturbances   Complete by: As directed    Call MD for:  persistant dizziness or light-headedness   Complete by: As directed    Call MD for:  persistant nausea and vomiting   Complete by: As directed    Diet - low sodium heart healthy   Complete by: As directed    Diet - low sodium heart healthy   Complete by: As directed    Diet - low sodium heart healthy   Complete by: As directed    Discharge instructions   Complete  by: As directed    It was pleasure taking care of you. You are being given aspirin and Plavix which she will take it together for 21 days and then stop taking Plavix and continue with aspirin.  Please be mindful as it will increase the chance of bleeding. We increased the dose of amlodipine as your blood pressure was high, you are on steroids that can increase the blood pressure and blood glucose level, please follow-up with your primary care provider very closely so they can monitor and make adjustment to your medications as needed. You are being discharged on prednisone 60 mg daily which she will take for next 2 weeks, then decrease to 50 mg for another 2 weeks, plan is to decrease to 10 mg every 2 weeks, please follow-up very closely with your primary care provider for further recommendations. Follow-up with your eye doctor.   Discharge instructions   Complete by: As directed    Patient is being admitted for temporal arteritis.  Patient is discharged to inpatient rehab. Advised to take prednisone and continue for slow taper over 3 weeks. Follow-up outpatient rheumatologist   Discharge wound care:   Complete by: As directed    Continue wound care in Rehab.   Increase activity slowly   Complete by: As directed    Increase activity slowly   Complete by: As directed    Increase activity slowly   Complete by: As directed    No dressing  needed   Complete by: As directed    No wound care   Complete by: As directed       Allergies as of 11/18/2020   No Known Allergies      Medication List     STOP taking these medications    pravastatin 40 MG tablet Commonly known as: PRAVACHOL       TAKE these medications    acetaminophen 325 MG tablet Commonly known as: TYLENOL Take 325-650 mg by mouth every 6 (six) hours as needed for moderate pain or headache.   amLODipine 5 MG tablet Commonly known as: NORVASC Take 1 tablet (5 mg total) by mouth daily. Start taking on: November 19, 2020   aspirin 81 MG EC tablet Take 1 tablet (81 mg total) by mouth daily. Swallow whole. Start taking on: November 19, 2020   atorvastatin 40 MG tablet Commonly known as: LIPITOR Take 1 tablet (40 mg total) by mouth daily.   clopidogrel 75 MG tablet Commonly known as: PLAVIX Take 1 tablet (75 mg total) by mouth daily for 21 days.   dorzolamide 2 % ophthalmic solution Commonly known as: TRUSOPT Place 1 drop into both eyes 2 (two) times daily.   insulin glargine-yfgn 100 UNIT/ML injection Commonly known as: SEMGLEE Inject 0.08 mLs (8 Units total) into the skin 2 (two) times daily.   Januvia 100 MG tablet Generic drug: sitaGLIPtin Take 100 mg by mouth daily.   losartan 25 MG tablet Commonly known as: COZAAR Take 1 tablet (25 mg total) by mouth daily. Start taking on: November 19, 2020   metFORMIN 1000 MG tablet Commonly known as: GLUCOPHAGE Take 1,000 mg by mouth 2 (two) times daily with a meal.   multivitamin tablet Take 1 tablet by mouth daily.   predniSONE 20 MG tablet Commonly known as: DELTASONE Take 3 tablets (60 mg total) by mouth daily with breakfast.               Discharge Care Instructions  (From admission, onward)           Start     Ordered   11/18/20 0000  No dressing needed        11/18/20 1325   11/18/20 0000  Discharge wound care:       Comments: Continue wound care in Rehab.   11/18/20 1236            Follow-up Information     Garvin Fila, MD Follow up.   Specialties: Neurology, Radiology Contact information: [address] [place] [address] [phone]         Sharilyn Sites, MD Follow up in 1 week(s).   Specialty: Family Medicine Contact information: [address] [place] [address] [phone]                No Known Allergies  Consultations: Neurology   Procedures/Studies: CT ANGIO HEAD NECK W WO CM  Result Date: 11/14/2020 CLINICAL DATA:  Syncope, recurrent EXAM: CT  ANGIOGRAPHY HEAD AND NECK TECHNIQUE: Multidetector CT imaging of the head and neck was performed using the standard protocol during bolus administration of intravenous contrast. Multiplanar CT image reconstructions and MIPs were obtained to evaluate the vascular anatomy. Carotid stenosis measurements (when applicable) are obtained utilizing NASCET criteria, using the distal internal carotid diameter as the denominator. CONTRAST:  59m OMNIPAQUE IOHEXOL 350 MG/ML SOLN COMPARISON:  11/08/2020 FINDINGS: CT HEAD Brain: There is no acute intracranial hemorrhage, mass effect, or edema. Gray-white differentiation is  preserved. There is no extra-axial fluid collection. Ventricles and sulci are stable in size and configuration. Stable findings of chronic microvascular ischemic changes in the cerebral white matter. Vascular: No hyperdense vessel. Skull: Calvarium is unremarkable. Sinuses/Orbits: No acute finding. Other: None. Review of the MIP images confirms the above findings CTA NECK Aortic arch: Stable appearance.  Great vessel origins are patent. Right carotid system: Stable appearance. Minimal calcified plaque at the ICA origin. No stenosis. Partially retropharyngeal course. Left carotid system: Stable. Calcified plaque along the distal common carotid and proximal internal carotid with less than 50% stenosis. Vertebral arteries: Stable appearance of patent left vertebral artery with calcified plaque causing stenosis at the origin. Multifocal severe stenosis of the right vertebral artery with occlusion at the C2-C3 level. Decreased reconstitution of proximal right V3 segment. Skeleton: Stable appearance. Other neck: No new findings. Upper chest: No new findings. Review of the MIP images confirms the above findings CTA HEAD Anterior circulation: Intracranial internal carotid arteries are patent with calcified plaque causing similar mild narrowing, left greater than right. Anterior and middle cerebral arteries are patent.  Anterior and middle cerebral arteries are patent. Stable appearance of bilateral stenoses. Posterior circulation: Stable appearance of partially opacified and irregular appearing intracranial right vertebral artery. Stable appearance of patent left vertebral artery and plaque. Stable appearance of basilar artery with areas of stenosis. Stable appearance of posterior cerebral arteries with areas of stenosis. Venous sinuses: Patent as allowed by contrast bolus timing. Review of the MIP images confirms the above findings IMPRESSION: No acute intracranial abnormality. No substantial change in appearance of vascular imaging compared to recent prior study with multifocal atherosclerosis and stenoses of anterior and posterior circulations. Electronically Signed   By: Macy Mis M.D.   On: 11/14/2020 16:13   CT ANGIO HEAD NECK W WO CM  Result Date: 11/08/2020 CLINICAL DATA:  Stroke/TIA, assess intracranial arteries EXAM: CT ANGIOGRAPHY HEAD AND NECK TECHNIQUE: Multidetector CT imaging of the head and neck was performed using the standard protocol during bolus administration of intravenous contrast. Multiplanar CT image reconstructions and MIPs were obtained to evaluate the vascular anatomy. Carotid stenosis measurements (when applicable) are obtained utilizing NASCET criteria, using the distal internal carotid diameter as the denominator. CONTRAST:  71m OMNIPAQUE IOHEXOL 350 MG/ML SOLN COMPARISON:  MRI of the same day.  CT head from 11/07/2020. FINDINGS: CT HEAD FINDINGS Brain: Small right cerebellar infarcts better characterized on same-day MRI. No evidence of interval acute large vascular territory infarct, acute hemorrhage, mass lesion, midline shift, hydrocephalus, or extra-axial fluid collection. Small remote lacunar infarcts also better seen on prior MRI. Patchy white matter hypoattenuation, nonspecific but compatible with chronic microvascular ischemic disease Vascular: See below Skull: No acute fracture.  Sinuses: Clear sinuses. Orbits: See same day MRI orbits for more sensitive evaluation. Review of the MIP images confirms the above findings CTA NECK FINDINGS Aortic arch: Atherosclerosis.  Great vessel origins are patent. Right carotid system: Mild atherosclerosis without significant (greater than 50%) stenosis. Retropharyngeal course of the ICA. Left carotid system: Moderate atherosclerosis at the carotid bifurcation without greater than 50% stenosis. Vertebral arteries: Multifocal severe stenosis of the right vertebral artery with occlusion at the C2-C3 level. Skeleton: Other neck: No acute abnormality. Upper chest: Biapical pleural-parenchymal scarring. Otherwise, visualized lung apices are clear. Review of the MIP images confirms the above findings CTA HEAD FINDINGS Anterior circulation: Bilateral intracranial ICAs are patent with mild narrowing due to calcific atherosclerosis. Severe narrowing of the distal M1 and proximal M2 MCA branches  bilaterally. Bilateral A1 ACAs are patent. Severe stenosis of the left A2 ACA. Mild right A2 ACA stenosis. Posterior circulation: Irregular opacification of a small right V3/V4 vertebral artery from muscular collaterals. Moderate stenosis of the proximal left intradural vertebral artery. Severe stenosis of the mid and distal basilar artery. Severe stenosis of the right P2 PCA. Small left P1 PCA with severe stenosis of the proximal left P2 PCA. Venous sinuses: As permitted by contrast timing, patent. Review of the MIP images confirms the above findings IMPRESSION: CT head: 1. Small inferior right cerebellar infarcts better seen on same-day MRI. 2. No evidence of interval acute abnormality. CTA: 1. Severe stenosis of the mid and distal basilar artery and bilateral P2 PCAs. 2. Severe stenosis of the distal M1 and proximal M2 MCA branches bilaterally. 3. Severe stenosis of the left A2 ACA. 4. Multifocal severe stenosis of the right vertebral artery in the neck with occlusion at  the C2-C3 level and irregular opacification of a small V3/V4 vertebral artery from collaterals. 5. Moderate stenosis of the intradural left vertebral artery. 6. Bilateral carotid bifurcation atherosclerosis without greater than 50% stenosis. Electronically Signed   By: Margaretha Sheffield M.D.   On: 11/08/2020 18:41   CT HEAD WO CONTRAST  Result Date: 11/07/2020 CLINICAL DATA:  Headache. EXAM: CT HEAD WITHOUT CONTRAST TECHNIQUE: Contiguous axial images were obtained from the base of the skull through the vertex without intravenous contrast. COMPARISON:  Jun 27, 2020 FINDINGS: Brain: There is mild cerebral atrophy with widening of the extra-axial spaces and ventricular dilatation. There are areas of decreased attenuation within the white matter tracts of the supratentorial brain, consistent with microvascular disease changes. Vascular: No hyperdense vessel or unexpected calcification. Skull: Normal. Negative for fracture or focal lesion. Sinuses/Orbits: No acute finding. Other: None. IMPRESSION: 1. Generalized cerebral atrophy. 2. No acute intracranial abnormality. Electronically Signed   By: Virgina Norfolk M.D.   On: 11/07/2020 21:26   MR Brain W and Wo Contrast  Addendum Date: 11/08/2020   ADDENDUM REPORT: 11/08/2020 12:45 ADDENDUM: MRI brain impressions 2 and 3, and MRI orbits impression 2 discussed with Dr. Langston Masker of the emergency department by telephone at 12:40 p.m. on 11/08/2020. Electronically Signed   By: Kellie Simmering D.O.   On: 11/08/2020 12:45   Result Date: 11/08/2020 CLINICAL DATA:  Vision loss, binocular; right-sided complete vision loss in setting of sinus infection, abducent nerve palsy right eye, differential including GCA versus cavernous thrombosis/infection. EXAM: MRI HEAD AND ORBITS WITHOUT AND WITH CONTRAST TECHNIQUE: Multiplanar, multiecho pulse sequences of the brain and surrounding structures were obtained without and with intravenous contrast. Multiplanar, multiecho pulse sequences  of the orbits and surrounding structures were obtained including fat saturation techniques, before and after intravenous contrast administration. CONTRAST:  50m GADAVIST GADOBUTROL 1 MMOL/ML IV SOLN COMPARISON:  Head CT 11/07/2020. FINDINGS: MRI HEAD FINDINGS Mild intermittent motion degradation. Brain: Mild for age generalized cerebral and cerebellar atrophy. There are two subcentimeter acute infarcts within the inferior right cerebellar hemisphere (for instance as seen on series 2, images 8 and 9). Chronic lacunar infarcts within the left cerebral hemispheric white matter, bilateral basal ganglia and bilateral thalami. Background moderate multifocal T2/FLAIR hyperintensity within the cerebral white matter and pons, nonspecific but compatible with chronic small vessel ischemic disease. Chronic microhemorrhage within the periventricular left frontal lobe (series 7, image 71). No evidence of an intracranial mass. No extra-axial fluid collection. No midline shift. Partially empty sella turcica. No pathologic intracranial enhancement identified. Vascular: Signal abnormality within the  right vertebral artery at the level of the foramen magnum suggesting high-grade stenosis or vessel occlusion at this site (series 5, image 3). Skull and upper cervical spine: No focal suspicious marrow lesion. MRI ORBITS FINDINGS Mild intermittent motion degradation. Orbits: Bilateral lens replacements. The globes are normal in size and contour. The extraocular muscles are symmetric and unremarkable. There is abnormal enhancement along the optic nerve sheaths bilaterally, compatible with bilateral optic nerve perineuritis. No intraorbital mass identified. Visualized sinuses: Trace mucosal thickening within the bilateral ethmoid air cells. Soft tissues: The visualized maxillofacial and upper neck soft tissues are unremarkable. IMPRESSION: MRI brain: 1. Mildly motion degraded exam. 2. Two subcentimeter acute infarcts within the inferior  right cerebellar hemisphere. 3. Signal abnormality within the right vertebral artery at the level of the foramen magnum, suggesting high-grade stenosis or vessel occlusion at this site. 4. Chronic lacunar infarcts within the left cerebral hemispheric white matter, bilateral basal ganglia, and bilateral thalami. Background moderate chronic small vessel ischemic changes within the cerebral white matter, and within the pons. 5. Mild generalized parenchymal atrophy. MRI orbits: 1. Mildly motion degraded exam. 2. Abnormal enhancement along the optic nerve sheaths bilaterally, compatible with bilateral optic nerve perineuritis. 3. Minimal mucosal thickening within the bilateral ethmoid air cells. Electronically Signed: By: Kellie Simmering D.O. On: 11/08/2020 12:31   ECHOCARDIOGRAM COMPLETE  Result Date: 11/10/2020    ECHOCARDIOGRAM REPORT   Patient Name:   Jenna Long Date of Exam: 11/10/2020 Medical Rec #:  [id number]      Height:       67.0 in Accession #:    [id number]     Weight:       132.3 lb Date of Birth:  March 09, 1934       BSA:          1.696 m Patient Age:    59 years       BP:           160/84 mmHg Patient Gender: F              HR:           78 bpm. Exam Location:  Inpatient Procedure: 2D Echo, Cardiac Doppler and Color Doppler Indications:    CVA  History:        Patient has no prior history of Echocardiogram examinations.  Sonographer:    Allen Referring Phys: [id number] [place]  1. Left ventricular ejection fraction, by estimation, is 70 to 75%. The left ventricle has hyperdynamic function. The left ventricle has no regional wall motion abnormalities. There is mild left ventricular hypertrophy. Left ventricular diastolic parameters are consistent with Grade I diastolic dysfunction (impaired relaxation).  2. Right ventricular systolic function is normal. The right ventricular size is normal.  3. The mitral valve is normal in structure. No evidence of mitral valve regurgitation. No evidence of  mitral stenosis.  4. The aortic valve is tricuspid. Aortic valve regurgitation is not visualized. Mild aortic valve sclerosis is present, with no evidence of aortic valve stenosis.  5. The inferior vena cava is normal in size with greater than 50% respiratory variability, suggesting right atrial pressure of 3 mmHg. FINDINGS  Left Ventricle: Left ventricular ejection fraction, by estimation, is 70 to 75%. The left ventricle has hyperdynamic function. The left ventricle has no regional wall motion abnormalities. The left ventricular internal cavity size was normal in size. There is mild left ventricular hypertrophy. Left ventricular diastolic parameters are consistent with Grade I diastolic  dysfunction (impaired relaxation). Right Ventricle: The right ventricular size is normal. Right ventricular systolic function is normal. Left Atrium: Left atrial size was normal in size. Right Atrium: Right atrial size was normal in size. Pericardium: There is no evidence of pericardial effusion. Mitral Valve: The mitral valve is normal in structure. No evidence of mitral valve regurgitation. No evidence of mitral valve stenosis. Tricuspid Valve: The tricuspid valve is normal in structure. Tricuspid valve regurgitation is trivial. No evidence of tricuspid stenosis. Aortic Valve: The aortic valve is tricuspid. Aortic valve regurgitation is not visualized. Mild aortic valve sclerosis is present, with no evidence of aortic valve stenosis. Aortic valve mean gradient measures 10.0 mmHg. Aortic valve peak gradient measures 19.7 mmHg. Aortic valve area, by VTI measures 2.63 cm. Pulmonic Valve: The pulmonic valve was not well visualized. Pulmonic valve regurgitation is not visualized. No evidence of pulmonic stenosis. Aorta: The aortic root is normal in size and structure. Venous: The inferior vena cava is normal in size with greater than 50% respiratory variability, suggesting right atrial pressure of 3 mmHg. IAS/Shunts: No atrial level  shunt detected by color flow Doppler.  LEFT VENTRICLE PLAX 2D LVIDd:         4.00 cm     Diastology LVIDs:         2.50 cm     LV e' medial:    5.44 cm/s LV PW:         1.60 cm     LV E/e' medial:  13.8 LV IVS:        1.20 cm     LV e' lateral:   6.74 cm/s LVOT diam:     2.30 cm     LV E/e' lateral: 11.2 LV SV:         108 LV SV Index:   64 LVOT Area:     4.15 cm  LV Volumes (MOD) LV vol d, MOD A4C: 40.7 ml LV vol s, MOD A4C: 16.4 ml LV SV MOD A4C:     40.7 ml RIGHT VENTRICLE             IVC RV S prime:     11.20 cm/s  IVC diam: 2.10 cm TAPSE (M-mode): 2.4 cm LEFT ATRIUM             Index       RIGHT ATRIUM           Index LA diam:        1.70 cm 1.00 cm/m  RA Area:     14.50 cm LA Vol (A2C):   35.0 ml 20.63 ml/m RA Volume:   29.90 ml  17.63 ml/m LA Vol (A4C):   17.9 ml 10.55 ml/m LA Biplane Vol: 26.7 ml 15.74 ml/m  AORTIC VALVE                    PULMONIC VALVE AV Area (Vmax):    2.60 cm     PV Vmax:       0.89 m/s AV Area (Vmean):   2.67 cm     PV Peak grad:  3.1 mmHg AV Area (VTI):     2.63 cm AV Vmax:           222.00 cm/s AV Vmean:          152.000 cm/s AV VTI:            0.412 m AV Peak Grad:      19.7 mmHg AV Mean Grad:  10.0 mmHg LVOT Vmax:         139.00 cm/s LVOT Vmean:        97.700 cm/s LVOT VTI:          0.261 m LVOT/AV VTI ratio: 0.63  AORTA Ao Root diam: 3.00 cm Ao Asc diam:  3.40 cm MITRAL VALVE MV Area (PHT): 2.12 cm     SHUNTS MV E velocity: 75.30 cm/s   Systemic VTI:  0.26 m MV A velocity: 132.00 cm/s  Systemic Diam: 2.30 cm MV E/A ratio:  0.57 Kirk Ruths MD Electronically signed by Kirk Ruths MD Signature Date/Time: 11/10/2020/12:42:08 PM    Final    VAS [place] TEMPORAL ARTERY BILATERAL  Result Date: 11/10/2020  TEMPORAL ARTERY REPORT Patient Name:  Jenna Long  Date of Exam:   11/10/2020 Medical Rec #: [id number]       Accession #:    [id number] Date of Birth: Sep 26, 1934        Patient Gender: F Patient Age:   31 years Exam Location:  [facility] Procedure:      VAS  [place] Referring Phys: Dagoberto Ligas --------------------------------------------------------------------------------  Indications: Gradual loss of vision- right eye and headache. High Risk Factors: Age > 67 yrs and female.  Comparison Study: No prior study Performing Technologist: Maudry Mayhew MHA, RDMS, RVT, RDCS  Examination Guidelines: Patient in reclined position. 2D, color and spectral doppler sampling in the temporal artery along the hairline and temple in the longitudinal plane. 2D images along the hairline and temple in the transverse plane. Exam is bilateral.  ++-------------------+------------------+ Right Halo POSITIVELeft Halo POSITIVE ++-------------------+------------------+ +---------------+----------+-----------+----------+-----------+                Width (cm)Lenght (cm)Width (cm)Lenght (cm) +---------------+----------+-----------+----------+-----------+ Temporal Artery0.10                                       +---------------+----------+-----------+----------+-----------+ Proximal       0.10                                       +---------------+----------+-----------+----------+-----------+ Mid            0.05                                       +---------------+----------+-----------+----------+-----------+ Distal         0.04                                       +---------------+----------+-----------+----------+-----------+ Parietal branch0.02                                       +---------------+----------+-----------+----------+-----------+ Frontal branch 0.04                                       +---------------+----------+-----------+----------+-----------+ Right superficial temporal artery exhibits hyperechoic halo sign with intimal thickness measuring 0.1cm. The right superficial temporal artery is noncompressible. Left superficial temporal artery without obvious  evidence of halo sign. Left superficial  temporal artery is compressible. Study was technically difficult due to vessel tortuosity/cording. Summary: Absence of a "halo" sign in the left temporal artery, although not definitive, makes a diagnosis of temporal arteritis unlikely. Presence of a "halo" sign in the right temporal artery suggests temporal arteritis.  *See table(s) above for measurements and observations.  Electronically signed by Jamelle Haring on 11/10/2020 at 6:55:05 PM.   Final    MR ORBITS W WO CONTRAST  Addendum Date: 11/08/2020   ADDENDUM REPORT: 11/08/2020 12:45 ADDENDUM: MRI brain impressions 2 and 3, and MRI orbits impression 2 discussed with Dr. Langston Masker of the emergency department by telephone at 12:40 p.m. on 11/08/2020. Electronically Signed   By: Kellie Simmering D.O.   On: 11/08/2020 12:45   Result Date: 11/08/2020 CLINICAL DATA:  Vision loss, binocular; right-sided complete vision loss in setting of sinus infection, abducent nerve palsy right eye, differential including GCA versus cavernous thrombosis/infection. EXAM: MRI HEAD AND ORBITS WITHOUT AND WITH CONTRAST TECHNIQUE: Multiplanar, multiecho pulse sequences of the brain and surrounding structures were obtained without and with intravenous contrast. Multiplanar, multiecho pulse sequences of the orbits and surrounding structures were obtained including fat saturation techniques, before and after intravenous contrast administration. CONTRAST:  80m GADAVIST GADOBUTROL 1 MMOL/ML IV SOLN COMPARISON:  Head CT 11/07/2020. FINDINGS: MRI HEAD FINDINGS Mild intermittent motion degradation. Brain: Mild for age generalized cerebral and cerebellar atrophy. There are two subcentimeter acute infarcts within the inferior right cerebellar hemisphere (for instance as seen on series 2, images 8 and 9). Chronic lacunar infarcts within the left cerebral hemispheric white matter, bilateral basal ganglia and bilateral thalami. Background moderate multifocal T2/FLAIR hyperintensity within the cerebral  white matter and pons, nonspecific but compatible with chronic small vessel ischemic disease. Chronic microhemorrhage within the periventricular left frontal lobe (series 7, image 71). No evidence of an intracranial mass. No extra-axial fluid collection. No midline shift. Partially empty sella turcica. No pathologic intracranial enhancement identified. Vascular: Signal abnormality within the right vertebral artery at the level of the foramen magnum suggesting high-grade stenosis or vessel occlusion at this site (series 5, image 3). Skull and upper cervical spine: No focal suspicious marrow lesion. MRI ORBITS FINDINGS Mild intermittent motion degradation. Orbits: Bilateral lens replacements. The globes are normal in size and contour. The extraocular muscles are symmetric and unremarkable. There is abnormal enhancement along the optic nerve sheaths bilaterally, compatible with bilateral optic nerve perineuritis. No intraorbital mass identified. Visualized sinuses: Trace mucosal thickening within the bilateral ethmoid air cells. Soft tissues: The visualized maxillofacial and upper neck soft tissues are unremarkable. IMPRESSION: MRI brain: 1. Mildly motion degraded exam. 2. Two subcentimeter acute infarcts within the inferior right cerebellar hemisphere. 3. Signal abnormality within the right vertebral artery at the level of the foramen magnum, suggesting high-grade stenosis or vessel occlusion at this site. 4. Chronic lacunar infarcts within the left cerebral hemispheric white matter, bilateral basal ganglia, and bilateral thalami. Background moderate chronic small vessel ischemic changes within the cerebral white matter, and within the pons. 5. Mild generalized parenchymal atrophy. MRI orbits: 1. Mildly motion degraded exam. 2. Abnormal enhancement along the optic nerve sheaths bilaterally, compatible with bilateral optic nerve perineuritis. 3. Minimal mucosal thickening within the bilateral ethmoid air cells.  Electronically Signed: By: KKellie SimmeringD.O. On: 11/08/2020 12:31     Subjective: Patient was seen and examined at bedside.  Overnight events noted.   Patient reports slight improvement in her right-sided vision. Patient is approved  for CIR.  Patient is being discharged.  Discharge Exam: Vitals:   11/18/20 0900 11/18/20 1137  BP: 114/80 (!) 149/81  Pulse: 69 70  Resp: 16 17  Temp: 97.6 F (36.4 C) 97.7 F (36.5 C)  SpO2: 93% 94%   Vitals:   11/18/20 0353 11/18/20 0751 11/18/20 0900 11/18/20 1137  BP: (!) 158/76 103/68 114/80 (!) 149/81  Pulse: (!) 57 60 69 70  Resp: '18 19 16 17  ' Temp: 97.6 F (36.4 C) 97.7 F (36.5 C) 97.6 F (36.4 C) 97.7 F (36.5 C)  TempSrc: Oral Oral Oral Oral  SpO2: 100% 100% 93% 94%  Weight:      Height:        General: Appears comfortable, not in any acute distress. Cardiovascular: RRR, S1/S2 +, no rubs, no gallops Respiratory: CTA bilaterally, no wheezing, no rhonchi Abdominal: Soft, NT, ND, bowel sounds + Extremities: no edema, no cyanosis    The results of significant diagnostics from this hospitalization (including imaging, microbiology, ancillary and laboratory) are listed below for reference.     Microbiology: No results found for this or any previous visit (from the past 240 hour(s)).   Labs: BNP (last 3 results) No results for input(s): BNP in the last 8760 hours. Basic Metabolic Panel: Recent Labs  Lab 11/15/20 0242 11/17/20 0143  NA 136 137  K 3.3* 4.1  CL 102 104  CO2 28 25  GLUCOSE 128* 102*  BUN 22 18  CREATININE 0.65 0.65  CALCIUM 8.6* 8.7*  MG 2.1  --    Liver Function Tests: No results for input(s): AST, ALT, ALKPHOS, BILITOT, PROT, ALBUMIN in the last 168 hours. No results for input(s): LIPASE, AMYLASE in the last 168 hours. No results for input(s): AMMONIA in the last 168 hours. CBC: Recent Labs  Lab 11/15/20 0242  WBC 6.6  NEUTROABS 4.7  HGB 10.9*  HCT 33.9*  MCV 86.0  PLT 363   Cardiac  Enzymes: No results for input(s): CKTOTAL, CKMB, CKMBINDEX, TROPONINI in the last 168 hours. BNP: Invalid input(s): POCBNP CBG: Recent Labs  Lab 11/17/20 1601 11/17/20 2042 11/17/20 2101 11/18/20 0600 11/18/20 1136  GLUCAP 256* 245* 234* 71 348*   D-Dimer No results for input(s): DDIMER in the last 72 hours. Hgb A1c No results for input(s): HGBA1C in the last 72 hours. Lipid Profile No results for input(s): CHOL, HDL, LDLCALC, TRIG, CHOLHDL, LDLDIRECT in the last 72 hours. Thyroid function studies No results for input(s): TSH, T4TOTAL, T3FREE, THYROIDAB in the last 72 hours.  Invalid input(s): FREET3 Anemia work up No results for input(s): VITAMINB12, FOLATE, FERRITIN, TIBC, IRON, RETICCTPCT in the last 72 hours. Urinalysis    Component Value Date/Time   COLORURINE YELLOW 02/19/2020 1646   APPEARANCEUR HAZY (A) 02/19/2020 1646   LABSPEC 1.020 02/19/2020 1646   PHURINE 5.0 02/19/2020 1646   GLUCOSEU >=500 (A) 02/19/2020 1646   HGBUR NEGATIVE 02/19/2020 1646   BILIRUBINUR NEGATIVE 02/19/2020 [place] 02/19/2020 1646   PROTEINUR 30 (A) 02/19/2020 1646   NITRITE NEGATIVE 02/19/2020 1646   LEUKOCYTESUR SMALL (A) 02/19/2020 1646   Sepsis Labs Invalid input(s): PROCALCITONIN,  WBC,  LACTICIDVEN Microbiology No results found for this or any previous visit (from the past 240 hour(s)).   Time coordinating discharge: Over 30 minutes  SIGNED:   Shawna Clamp, MD  [facility] 11/18/2020, 1:55 PM Pager   If 7PM-7AM, please contact night-coverage

## 2020-11-18 NOTE — TOC Transition Note (Signed)
Transition of Care [facility]) - CM/SW Discharge Note   Patient Details  Name: Jenna Long MRN: [id number] Date of Birth: 04-09-1934  [facility] CM/SW Contact:  Verdell Carmine, RN Phone Number: 11/18/2020, 12:50 PM   Clinical Narrative:     Patient will be going to CIR today. Will likely need HH post CIR.   Final next level of care: IP Rehab Facility Barriers to Discharge: Insurance Authorization   Patient Goals and CMS Choice Patient states their goals for this hospitalization and ongoing recovery are:: INPT rehab then home with [facility] CMS Medicare.gov Compare Post Acute Care list provided to:: Patient Choice offered to / list presented to : Patient, Adult Children  Discharge Placement             DC to CIR          Discharge Plan and Services   Discharge Planning Services: CM Consult Post Acute Care Choice: Home Health, IP Rehab                    HH Arranged: RN, PT, OT, Nurse's Aide          Social Determinants of Health (SDOH) Interventions     Readmission Risk Interventions No flowsheet data found.

## 2020-11-18 NOTE — Progress Notes (Signed)
Inpatient Rehabilitation Medication Review by a Pharmacist  A complete drug regimen review was completed for this patient to identify any potential clinically significant medication issues.  High Risk Drug Classes Is patient taking? Indication by Medication  Antipsychotic No   Anticoagulant Yes Enoxaparin for VTE proph  Antibiotic No   Opioid Yes Norco for moderate pain  Antiplatelet Yes Aspirin and Plavix for secondary prevention of stroke  Hypoglycemics/insulin Yes Semglee and SSI for DM  Vasoactive Medication Yes Amlodipine and losartan for HTN  Chemotherapy No   Other Yes Prednisone for temporal arteritis     Type of Medication Issue Identified Description of Issue Recommendation(s)  Drug Interaction(s) (clinically significant)     Duplicate Therapy     Allergy     No Medication Administration End Date     Incorrect Dose     Additional Drug Therapy Needed     Significant med changes from prior encounter (inform family/care partners about these prior to discharge).    Other  Metformin and sitagliptin not resumed. MVI not resumed. Resume as appropriate for DM.    Clinically significant medication issues were identified that warrant physician communication and completion of prescribed/recommended actions by midnight of the next day:  No  Name of provider notified for urgent issues identified: n/a  Provider Method of Notification: n/a    Pharmacist comments: Consider resuming metformin and sitagliptin as blood sugars require.   Time spent performing this drug regimen review (minutes):  10   Thank you for involving pharmacy in this patient's care.  Renold Genta, PharmD, BCPS Clinical Pharmacist Clinical phone for 11/18/2020 until 10p is [phone] 11/18/2020 3:03 PM  **Pharmacist phone directory can be found on [email or web address] listed under [place]**

## 2020-11-18 NOTE — H&P (Signed)
Physical Medicine and Rehabilitation Admission H&P    Chief Complaint  Patient presents with   Loss of Vision  : HPI: Jenna Long is a 86 year old right-handed female with history of hypertension, hyperlipidemia, diabetes mellitus and left hip hemiarthroplasty 02/18/2020 after a fall.  History taken from chart review patient.  Patient lives alone.  1 level home 3 steps to entry.  Independent with assistive device.  She performs her own grocery shopping and manages medications.  Her granddaughter will be providing assistance on discharge.  She presented on 11/07/20 with vision right eye.  She denied any headache but did have some temporal discomfort x 2 weeks since her sinuses were giving her problems.  CT/MRI showed 2 subcentimeter acute infarcts within the right inferior cerebellar hemisphere.  Signal abnormality within the right vertebral artery at the level of the foramen magnum suggesting high-grade stenosis or vessel occlusion at that site.  Chronic lacunar infarcts within the left cerebral hemispheric white matter bilateral basal ganglia and bilateral thalami.  MRI orbits abnormal enhancement along the optic nerve sheaths bilaterally, compatible with bilateral optic nerve perineuritis.  CT angiogram head and neck severe stenosis of the mid and distal basilar artery and bilateral P2 PCAs.  Severe stenosis of distal M1 and proximal M2 MCA branches bilaterally.  Severe stenosis of the left A2 ACA.  Multifocal severe stenosis of the right vertebral artery in the neck with occlusion at the C2-3 level and irregular opacification of a small V3/V4 vertebral artery from collaterals.  Bilateral carotid bifurcation atherosclerosis without greater than 50% stenosis.  Echocardiogram with ejection fraction of 70-75%, no wall motion abnormalities grade 1 diastolic dysfunction.  Her admission chemistries were unremarkable except sodium 134 glucose 205, hemoglobin 10, sedimentation rate 111, C-reactive protein  8.6, hemoglobin A1c 6.3.  Neurology follow-up concern for temporal arteriolitis with right temporal artery biopsy completed 11/09/2020 per Dr. Stanford Breed.  She did complete a 3-day course of Solu-Medrol and started on prednisone 60 mg daily..  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  Plan is for very slow taper of prednisone decreased to 10 mg every 2 weeks at follow-up with rheumatology as well as neurology.  Patient did have an episode of orthostatic hypotension while standing at the sink 11/11/2020 dropping to 97 and monitored.  She is currently maintained on aspirin 81 mg daily and Plavix 75 mg day x3 weeks and aspirin alone.  Placed on Lovenox for DVT prophylaxis.  Tolerating a regular consistency diet.  Due to patient decreased functional ability right eye vision loss patient was admitted for a comprehensive rehab program.  Patient with resulting functional deficits with mobility, self-care.  Please see preadmission assessment from earlier today as well.  Review of Systems  Constitutional:  Negative for chills and fever.  HENT:  Negative for hearing loss.   Eyes:        Right eye intermittent vision loss  Respiratory:  Negative for cough and shortness of breath.   Cardiovascular:  Negative for chest pain, palpitations and leg swelling.  Gastrointestinal:  Positive for constipation. Negative for heartburn, nausea and vomiting.  Genitourinary:  Negative for dysuria, flank pain and hematuria.  Musculoskeletal:  Positive for joint pain and myalgias.  Skin:  Negative for rash.  Neurological:  Positive for weakness.       Temporal headache  All other systems reviewed and are negative. Past Medical History:  Diagnosis Date   Diabetes mellitus type II    DNR (do not resuscitate) 11/08/2020  Hypercholesteremia    Hypertension    Past Surgical History:  Procedure Laterality Date   ABDOMINAL HYSTERECTOMY  1991   [facility], [place]   left, [place]   ARTERY BIOPSY Right  11/09/2020   Procedure: BIOPSY TEMPORAL ARTERY;  Surgeon: Cherre Robins, MD;  Location: [facility];  Service: Vascular;  Laterality: Right;   CATARACT EXTRACTION W/ INTRAOCULAR LENS IMPLANT  06/10/06    Right, [facility], [place]   CATARACT EXTRACTION W/PHACO  09/18/2010   Procedure: CATARACT EXTRACTION PHACO AND INTRAOCULAR LENS PLACEMENT ([place]);  Surgeon: Williams Che;  Location: [facility];  Service: Ophthalmology;  Laterality: Left;   COLONOSCOPY W/ POLYPECTOMY  06/20/10   [facility], [place]   HIP ARTHROPLASTY Left 02/18/2020   Procedure: ARTHROPLASTY BIPOLAR HIP (HEMIARTHROPLASTY);  Surgeon: Mordecai Rasmussen, MD;  Location: [facility];  Service: Orthopedics;  Laterality: Left;   Family History  Problem Relation Age of Onset   Anesthesia problems Neg Hx    Social History:  reports that she has never smoked. She has never used smokeless tobacco. She reports that she does not drink alcohol and does not use drugs. Allergies: No Known Allergies Medications Prior to Admission  Medication Sig Dispense Refill   acetaminophen (TYLENOL) 325 MG tablet Take 325-650 mg by mouth every 6 (six) hours as needed for moderate pain or headache.     amLODipine (NORVASC) 5 MG tablet Take 5 mg by mouth daily.     dorzolamide (TRUSOPT) 2 % ophthalmic solution Place 1 drop into both eyes 2 (two) times daily.     JANUVIA 100 MG tablet Take 100 mg by mouth daily.     metFORMIN (GLUCOPHAGE) 1000 MG tablet Take 1,000 mg by mouth 2 (two) times daily with a meal.     Multiple Vitamin (MULTIVITAMIN) tablet Take 1 tablet by mouth daily.     pravastatin (PRAVACHOL) 40 MG tablet Take 40 mg by mouth at bedtime.      Drug Regimen Review Drug regimen was reviewed and remains appropriate with no significant issues identified  Home: Home Living Family/patient expects to be discharged to:: Private residence Living Arrangements: Alone Available Help at Discharge: Family, Available PRN/intermittently Type of Home: House Home Access: Stairs to  enter [employer] of Steps: 3 Entrance Stairs-Rails: Right, Left, Can reach both Home Layout: One level Bathroom Shower/Tub: Chiropodist: Handicapped height Home Equipment: Environmental consultant - 2 wheels, Bedside commode, Cane - single point, Shower seat, Wheelchair - manual, Grab bars - tub/shower Additional Comments: bathes at sink due to tub being too tall to step over  Lives With: Alone   Functional History: Prior Function Level of Independence: Independent with assistive device(s) Comments: Patient states independent with ADL and community ambulator with [place]. Performs grocery shopping, manages own medications. Patient states granddaughter will be coming to stay with her at dischage. Reports one fall 9 months ago resulting in hip fx (said she fell outside, crawled to porch and stayed there all night)  Functional Status:  Mobility: Bed Mobility Overal bed mobility: Needs Assistance Bed Mobility: Supine to Sit Supine to sit: HOB elevated, Mod assist Sit to supine: Supervision, HOB elevated General bed mobility comments: ModA for HHA to elevate trunk Transfers Overall transfer level: Needs assistance Equipment used: Rolling walker (2 wheeled) Transfers: Sit to/from Stand Sit to Stand: Min assist, Mod assist Stand pivot transfers: Min assist, +2 safety/equipment General transfer comment: Pt requiring multiple attempts for initial stand from EOB to RW, ultimately  requiring minA for trunk elevation and stability; additional trial standing from bed with SPC, reliant on modA to prevent posterior LOB Ambulation/Gait Ambulation/Gait assistance: Min assist, Mod assist Gait Distance (Feet): 20 Feet (+80+40) Assistive device: Rolling walker (2 wheeled), Straight cane Gait Pattern/deviations: Decreased stride length, Shuffle, Step-through pattern, Trunk flexed General Gait Details: Initial bouts of in-room and hallway ambulation with RW and minA for stability, seated rest  after performing ADL tasks at sink; additional gait trial with pt's cane, pt requiring up to modA to prevent LOB, endorses "my (left) leg feels like it's giving out" Gait velocity: Decreased    ADL: ADL Overall ADL's : Needs assistance/impaired Eating/Feeding: Set up, Sitting Eating/Feeding Details (indicate cue type and reason): assist to open OJ cup Grooming: Set up, Sitting, Wash/dry face Grooming Details (indicate cue type and reason): seated at sink Upper Body Bathing: Set up, Sitting Upper Body Bathing Details (indicate cue type and reason): to wash under arms Lower Body Bathing: Minimal assistance, Sit to/from stand Lower Body Bathing Details (indicate cue type and reason): able to bathe peri region with min guard to maintain balance. Difficulty reaching feet Lower Body Dressing: Maximal assistance, Sit to/from stand Lower Body Dressing Details (indicate cue type and reason): Max A to don around feet, cues to assist around waist. Pt daughter present reports pt uses reacher to get clothing around feet at home Toilet Transfer: Minimal assistance, Stand-pivot Toilet Transfer Details (indicate cue type and reason): pt declining use of a RW this session; min A for boosting and verbal cues for transfer without RW. pt had good hand placement and demonstrated goo dsafety with transfer Toileting- Clothing Manipulation and Hygiene: Moderate assistance, Sitting/lateral lean, Sit to/from stand Toileting - Clothing Manipulation Details (indicate cue type and reason): unable to urinate as pt thought on BSC, assist to doff/don brief around bottom Functional mobility during ADLs: Minimal assistance, Rolling walker General ADL Comments: Pt noted with increased impulsivity and decreased safety awareness. Entering as pt sitting EOB and reporting desire to transfer to chair (bed alarm sounding) with daughter present. Encouragement needed to change soiled brief (daughter also encouraged) with pt agreeable.  No drops in BP noted. Pt reports vision issues still present but too distractible to attend to tasks  Cognition: Cognition Overall Cognitive Status: Impaired/Different from baseline Orientation Level: Oriented X4 Cognition Arousal/Alertness: Awake/alert Behavior During Therapy: WFL for tasks assessed/performed, Impulsive Overall Cognitive Status: Impaired/Different from baseline Area of Impairment: Safety/judgement, Problem solving, Following commands, Awareness, Attention Current Attention Level: Selective Following Commands: Follows one step commands consistently, Follows multi-step commands inconsistently Safety/Judgement: Decreased awareness of safety, Decreased awareness of deficits Awareness: Emergent Problem Solving: Slow processing, Requires tactile cues, Requires verbal cues General Comments: Pt very conversant and distractable during session; looks for frequent positive reinforcement related to mobility. Cues for safety awareness, difficulty multitasking  Physical Exam: Blood pressure (!) 158/76, pulse (!) 57, temperature 97.6 F (36.4 C), temperature source Oral, resp. rate 18, height 5\' 7"  (1.702 m), weight 60 kg, SpO2 100 %. Physical Exam Vitals reviewed.  Constitutional:      General: She is not in acute distress.    Appearance: Normal appearance. She is not ill-appearing.  HENT:     Head: Normocephalic.     Comments: Right chest wall incision healing    Nose: Nose normal.  Eyes:     General:        Right eye: No discharge.        Left eye: No discharge.  Extraocular Movements: Extraocular movements intact.     Comments: Patient with complete vision loss on the right.  Cardiovascular:     Rate and Rhythm: Normal rate and regular rhythm.  Pulmonary:     Effort: Pulmonary effort is normal. No respiratory distress.     Breath sounds: No stridor.  Abdominal:     General: Abdomen is flat. Bowel sounds are normal. There is no distension.  Musculoskeletal:      Cervical back: Normal range of motion and neck supple.     Comments: No edema or tenderness in extremities  Skin:    General: Skin is warm and dry.     Comments: Right temporal area with dried blood  Neurological:     Mental Status: She is alert.     Comments: Alert and oriented x3 Some difficulty following two-step commands Motor: Appears to be 4+/5 throughout, however some difficulty following commands  Psychiatric:        Mood and Affect: Mood normal.        Behavior: Behavior normal.     Comments: Appears slightly confused    Results for orders placed or performed during the hospital encounter of 11/07/20 (from the past 48 hour(s))  Glucose, capillary     Status: None   Collection Time: 11/16/20  6:19 AM  Result Value Ref Range   Glucose-Capillary 82 70 - 99 mg/dL    Comment: Glucose reference range applies only to samples taken after fasting for at least 8 hours.  Glucose, capillary     Status: Abnormal   Collection Time: 11/16/20 12:08 PM  Result Value Ref Range   Glucose-Capillary 265 (H) 70 - 99 mg/dL    Comment: Glucose reference range applies only to samples taken after fasting for at least 8 hours.  Glucose, capillary     Status: Abnormal   Collection Time: 11/16/20  4:42 PM  Result Value Ref Range   Glucose-Capillary 221 (H) 70 - 99 mg/dL    Comment: Glucose reference range applies only to samples taken after fasting for at least 8 hours.  Glucose, capillary     Status: Abnormal   Collection Time: 11/16/20  8:59 PM  Result Value Ref Range   Glucose-Capillary 177 (H) 70 - 99 mg/dL    Comment: Glucose reference range applies only to samples taken after fasting for at least 8 hours.  Basic metabolic panel     Status: Abnormal   Collection Time: 11/17/20  1:43 AM  Result Value Ref Range   Sodium 137 135 - 145 mmol/L   Potassium 4.1 3.5 - 5.1 mmol/L   Chloride 104 98 - 111 mmol/L   CO2 25 22 - 32 mmol/L   Glucose, Bld 102 (H) 70 - 99 mg/dL    Comment: Glucose  reference range applies only to samples taken after fasting for at least 8 hours.   BUN 18 8 - 23 mg/dL   Creatinine, Ser 0.65 0.44 - 1.00 mg/dL   Calcium 8.7 (L) 8.9 - 10.3 mg/dL   GFR, Estimated >60 >60 mL/min    Comment: (NOTE) Calculated using the CKD-EPI Creatinine Equation (2021)    Anion gap 8 5 - 15    Comment: Performed at [facility] [address] [place], [place] [address]  Glucose, capillary     Status: Abnormal   Collection Time: 11/17/20  6:47 AM  Result Value Ref Range   Glucose-Capillary 68 (L) 70 - 99 mg/dL    Comment: Glucose reference  range applies only to samples taken after fasting for at least 8 hours.  Glucose, capillary     Status: Abnormal   Collection Time: 11/17/20  7:21 AM  Result Value Ref Range   Glucose-Capillary 111 (H) 70 - 99 mg/dL    Comment: Glucose reference range applies only to samples taken after fasting for at least 8 hours.  Glucose, capillary     Status: Abnormal   Collection Time: 11/17/20 11:29 AM  Result Value Ref Range   Glucose-Capillary 196 (H) 70 - 99 mg/dL    Comment: Glucose reference range applies only to samples taken after fasting for at least 8 hours.  Glucose, capillary     Status: Abnormal   Collection Time: 11/17/20  4:01 PM  Result Value Ref Range   Glucose-Capillary 256 (H) 70 - 99 mg/dL    Comment: Glucose reference range applies only to samples taken after fasting for at least 8 hours.  Glucose, capillary     Status: Abnormal   Collection Time: 11/17/20  8:42 PM  Result Value Ref Range   Glucose-Capillary 245 (H) 70 - 99 mg/dL    Comment: Glucose reference range applies only to samples taken after fasting for at least 8 hours.  Glucose, capillary     Status: Abnormal   Collection Time: 11/17/20  9:01 PM  Result Value Ref Range   Glucose-Capillary 234 (H) 70 - 99 mg/dL    Comment: Glucose reference range applies only to samples taken after fasting for at least 8 hours.   Comment 1 Notify RN    Comment 2  Document in Chart    No results found.     Medical Problem List and Plan: 1.  Deficits with mobility, transfers, vision secondary to right temporal arteritis secondary to severe right vertebral artery stenosis. PLAN PREDNISONE TAPER BY 10 MG EVERY 2-WEEK AND FOLLOW UP NEUROLOGY/RHEUMATOLOGY  -patient may shower  -ELOS/Goals: 13-16 days/supervision/min a  Admit to CIR 2.  Antithrombotics: -DVT/anticoagulation:  Pharmaceutical: Lovenox  -antiplatelet therapy: Aspirin 81 mg daily and Plavix 75 mg day x3 weeks then aspirin alone 3. Pain Management: Hydrocodone as needed 4. Mood: Provide emotional support  -antipsychotic agents: N/A 5. Neuropsych: This patient is capable of making decisions on her own behalf. 6. Skin/Wound Care: Routine skin checks 7. Fluids/Electrolytes/Nutrition: Routine in and outs  CMP ordered 8.  Hypertension.  Norvasc 5 mg daily, Cozaar 25 mg daily.   Monitor with increased mobility  9.  Hyperlipidemia for Lipitor 10.  Diabetes mellitus.  Hemoglobin A1c 6.3.  Semglee 8 units twice daily  Monitor with increased mobility 11.  Right cerebellar infarct  Cathlyn Parsons, PA-C 11/18/2020  I have personally performed a face to face diagnostic evaluation, including, but not limited to relevant history and physical exam findings, of this patient and developed relevant assessment and plan.  Additionally, I have reviewed and concur with the physician assistant's documentation above.  Delice Lesch, MD, ABPMR

## 2020-11-18 NOTE — Progress Notes (Signed)
Inpatient Diabetes Program Recommendations  AACE/ADA: New Consensus Statement on Inpatient Glycemic Control (2015)  Target Ranges:  Prepandial:   less than 140 mg/dL      Peak postprandial:   less than 180 mg/dL (1-2 hours)      Critically ill patients:  140 - 180 mg/dL   Lab Results  Component Value Date   GLUCAP 348 (H) 11/18/2020   HGBA1C 6.3 (H) 11/08/2020    Review of Glycemic Control Results for Jenna Long, Jenna Long (MRN [id number]) as of 11/18/2020 12:02  Ref. Range 11/17/2020 20:42 11/17/2020 21:01 11/18/2020 06:00 11/18/2020 11:36  Glucose-Capillary Latest Ref Range: 70 - 99 mg/dL 245 (H) 234 (H) 71 348 (H)  Diabetes history: Type 2 DM Outpatient Diabetes medications: Januvia 100 mg QD, Metformin 1000 mg BID Current orders for Inpatient glycemic control: Novolog 0-15 units TID, Semglee 8 units BID Prednisone 60 mg QD   Inpatient Diabetes Program Recommendations:     In the setting of steroids, Consider adding Novolog 3 units TID (assuming patient is consuming >50% of meals).   Thanks, Bronson Curb, MSN, RNC-OB Diabetes Coordinator [phone] (8a-5p)

## 2020-11-18 NOTE — Progress Notes (Signed)
Patient transferred to 4W 7.  Patient transported by [employer].

## 2020-11-18 NOTE — Progress Notes (Signed)
Mobility Specialist Progress Note   11/18/20 1045  Mobility  Activity Ambulated in hall  Level of Assistance Minimal assist, patient does 75% or more  Assistive Device Four wheel walker  Distance Ambulated (ft) 220 ft  Mobility Ambulated with assistance in hallway  Mobility Response Tolerated well  Mobility performed by Mobility specialist  Bed Position Chair  $Mobility charge 1 Mobility   Received pt in recliner ecstatic to go for walk. Used rollator this session based off the needed seated breaks on 09/29's mobility session. Pt actually needed no breaks and denies any symptoms. Returned back to recliner w/ call bell by side wanting to walk again after lunch.  Holland Falling Mobility Specialist Phone Number [phone]

## 2020-11-18 NOTE — Progress Notes (Signed)
Inpatient Rehab Admissions Coordinator:   I have a bed for this Pt. On CIR tomorrow, Saturday 11/19/20. MD to place d/c order Saturday morning. RN may call report to [phone].  Clemens Catholic, [place], [place] Admissions Coordinator  [phone] ([place]) [phone] (office)

## 2020-11-18 NOTE — Discharge Instructions (Signed)
Patient is being admitted for temporal arteritis.  Patient is discharged to inpatient rehab. Advised to take prednisone and continue for slow taper over 3 weeks. Follow-up outpatient rheumatologist

## 2020-11-19 DIAGNOSIS — M316 Other giant cell arteritis: Secondary | ICD-10-CM

## 2020-11-19 LAB — GLUCOSE, CAPILLARY
Glucose-Capillary: 78 mg/dL (ref 70–99)
Glucose-Capillary: 133 mg/dL — ABNORMAL HIGH (ref 70–99)
Glucose-Capillary: 292 mg/dL — ABNORMAL HIGH (ref 70–99)
Glucose-Capillary: 238 mg/dL — ABNORMAL HIGH (ref 70–99)

## 2020-11-19 NOTE — Progress Notes (Signed)
PROGRESS NOTE   Subjective/Complaints:  Discussed low am CBG and elevated BP No problems overnite, per LPN pt  a little confused on admit Reviewed labs ROS- No CP, SOB, N/V/D Objective:   No results found. Recent Labs    11/18/20 1433  WBC 8.8  HGB 11.3*  HCT 35.9*  PLT 319   Recent Labs    11/17/20 0143 11/18/20 1433  NA 137  --   K 4.1  --   CL 104  --   CO2 25  --   GLUCOSE 102*  --   BUN 18  --   CREATININE 0.65 0.94  CALCIUM 8.7*  --     Intake/Output Summary (Last 24 hours) at 11/19/2020 0711 Last data filed at 11/19/2020 0402 Gross per 24 hour  Intake 120 ml  Output 2 ml  Net 118 ml        Physical Exam: Vital Signs Blood pressure (!) 190/77, pulse (!) 56, temperature 98 F (36.7 C), resp. rate 18, height 5\' 7"  (1.702 m), weight 59.4 kg, SpO2 98 %.  General: No acute distress Mood and affect are appropriate Heart: Regular rate and rhythm no rubs murmurs or extra sounds Lungs: Clear to auscultation, breathing unlabored, no rales or wheezes Abdomen: Positive bowel sounds, soft nontender to palpation, nondistended Extremities: No clubbing, cyanosis, or edema Skin: No evidence of breakdown, no evidence of rash Neurologic: Cranial nerves II through XII intact, motor strength is 5/5 in bilateral deltoid, bicep, tricep, grip, hip flexor, knee extensors, ankle dorsiflexor and plantar flexor Sensory exam normal sensation to light touch and proprioception in bilateral upper and lower extremities Cerebellar exam normal finger to nose to finger  in bilateral upper  Musculoskeletal: Full range of motion in all 4 extremities. No joint swelling    Assessment/Plan: 1. Functional deficits which require 3+ hours per day of interdisciplinary therapy in a comprehensive inpatient rehab setting. Physiatrist is providing close team supervision and 24 hour management of active medical problems listed  below. Physiatrist and rehab team continue to assess barriers to discharge/monitor patient progress toward functional and medical goals  Care Tool:  Bathing              Bathing assist       Upper Body Dressing/Undressing Upper body dressing   What is the patient wearing?: Hospital gown only    Upper body assist Assist Level: Set up assist    Lower Body Dressing/Undressing Lower body dressing            Lower body assist       Toileting Toileting    Toileting assist Assist for toileting: Moderate Assistance - Patient 50 - 74%     Transfers Chair/bed transfer  Transfers assist           Locomotion Ambulation   Ambulation assist              Walk 10 feet activity   Assist           Walk 50 feet activity   Assist           Walk 150 feet activity   Assist  Walk 10 feet on uneven surface  activity   Assist           Wheelchair     Assist               Wheelchair 50 feet with 2 turns activity    Assist            Wheelchair 150 feet activity     Assist          Blood pressure (!) 190/77, pulse (!) 56, temperature 98 F (36.7 C), resp. rate 18, height 5\' 7"  (1.702 m), weight 59.4 kg, SpO2 98 %.  Medical Problem List and Plan: 1.  Deficits with mobility, transfers, vision secondary to right temporal arteritis secondary to severe right vertebral artery stenosis. PLAN PREDNISONE TAPER BY 10 MG EVERY 2-WEEK AND FOLLOW UP NEUROLOGY/RHEUMATOLOGY Right cerebellar infarcts             -patient may shower             -ELOS/Goals: 13-16 days/supervision/min a             Admit to CIR 2.  Antithrombotics: -DVT/anticoagulation:  Pharmaceutical: Lovenox             -antiplatelet therapy: Aspirin 81 mg daily and Plavix 75 mg day x3 weeks then aspirin alone 3. Pain Management: Hydrocodone as needed 4. Mood: Provide emotional support             -antipsychotic agents: N/A 5. Neuropsych:  This patient is capable of making decisions on her own behalf. 6. Skin/Wound Care: Routine skin checks 7. Fluids/Electrolytes/Nutrition: Routine in and outs             CMP ordered 8.  Hypertension.  Norvasc 5 mg daily, Cozaar 25 mg daily.   Vitals:   11/18/20 1933 11/19/20 0404  BP: 139/72 (!) 190/77  Pulse: 66 (!) 56  Resp: 18 18  Temp: 98 F (36.7 C) 98 F (36.7 C)  SpO2: 98% 98%  Elevated this am prior to meds check orthostatics prior to dose change  9.  Hyperlipidemia for Lipitor 10.  Diabetes mellitus.  Hemoglobin A1c 6.3.  Semglee 8 units twice daily             Monitor with increased mobility CBG (last 3)  Recent Labs    11/18/20 1651 11/18/20 2107 11/19/20 0555  GLUCAP 198* 224* 78  Add hs snack  11.  Right cerebellar infarct    LOS: 1 days A FACE TO FACE EVALUATION WAS PERFORMED  Charlett Blake 11/19/2020, 7:11 AM

## 2020-11-19 NOTE — Plan of Care (Signed)
  Problem: RH Balance Goal: LTG Patient will maintain dynamic sitting balance (PT) Description: LTG:  Patient will maintain dynamic sitting balance with assistance during mobility activities (PT) Flowsheets (Taken 11/19/2020 1436) LTG: Pt will maintain dynamic sitting balance during mobility activities with:: Independent with assistive device  Goal: LTG Patient will maintain dynamic standing balance (PT) Description: LTG:  Patient will maintain dynamic standing balance with assistance during mobility activities (PT) Flowsheets (Taken 11/19/2020 1436) LTG: Pt will maintain dynamic standing balance during mobility activities with:: Supervision/Verbal cueing   Problem: Sit to Stand Goal: LTG:  Patient will perform sit to stand with assistance level (PT) Description: LTG:  Patient will perform sit to stand with assistance level (PT) Flowsheets (Taken 11/19/2020 1436) LTG: PT will perform sit to stand in preparation for functional mobility with assistance level: Supervision/Verbal cueing   Problem: RH Bed Mobility Goal: LTG Patient will perform bed mobility with assist (PT) Description: LTG: Patient will perform bed mobility with assistance, with/without cues (PT). Flowsheets (Taken 11/19/2020 1436) LTG: Pt will perform bed mobility with assistance level of: Supervision/Verbal cueing   Problem: RH Bed to Chair Transfers Goal: LTG Patient will perform bed/chair transfers w/assist (PT) Description: LTG: Patient will perform bed to chair transfers with assistance (PT). Flowsheets (Taken 11/19/2020 1436) LTG: Pt will perform Bed to Chair Transfers with assistance level: Supervision/Verbal cueing   Problem: RH Car Transfers Goal: LTG Patient will perform car transfers with assist (PT) Description: LTG: Patient will perform car transfers with assistance (PT). Flowsheets (Taken 11/19/2020 1436) LTG: Pt will perform car transfers with assist:: Minimal Assistance - Patient > 75%   Problem: RH Furniture  Transfers Goal: LTG Patient will perform furniture transfers w/assist (OT/PT) Description: LTG: Patient will perform furniture transfers  with assistance (OT/PT). Flowsheets (Taken 11/19/2020 1436) LTG: Pt will perform furniture transfers with assist:: Contact Guard/Touching assist   Problem: RH Ambulation Goal: LTG Patient will ambulate in controlled environment (PT) Description: LTG: Patient will ambulate in a controlled environment, # of feet with assistance (PT). Flowsheets (Taken 11/19/2020 1436) LTG: Pt will ambulate in controlled environ  assist needed:: Supervision/Verbal cueing LTG: Ambulation distance in controlled environment: 120ft with LRAD Goal: LTG Patient will ambulate in home environment (PT) Description: LTG: Patient will ambulate in home environment, # of feet with assistance (PT). Flowsheets (Taken 11/19/2020 1436) LTG: Pt will ambulate in home environ  assist needed:: Supervision/Verbal cueing LTG: Ambulation distance in home environment: 72ft with LRAD   Problem: RH Wheelchair Mobility Goal: LTG Patient will propel w/c in controlled environment (PT) Description: LTG: Patient will propel wheelchair in controlled environment, # of feet with assist (PT) Flowsheets (Taken 11/19/2020 1436) LTG: Pt will propel w/c in controlled environ  assist needed:: Supervision/Verbal cueing LTG: Propel w/c distance in controlled environment: 146ft   Problem: RH Stairs Goal: LTG Patient will ambulate up and down stairs w/assist (PT) Description: LTG: Patient will ambulate up and down # of stairs with assistance (PT) Flowsheets (Taken 11/19/2020 1436) LTG: Pt will ambulate up/down stairs assist needed:: Contact Guard/Touching assist LTG: Pt will  ambulate up and down number of stairs: 4 steps with BUE support

## 2020-11-19 NOTE — Progress Notes (Signed)
Physical Therapy Session Note  Patient Details  Name: Jenna Long MRN: [id number] Date of Birth: January 22, 1935  Today's Date: 11/19/2020 PT Individual Time: 1650-1730 PT Individual Time Calculation (min): 40 min   Short Term Goals: Week 1:  PT Short Term Goal 1 (Week 1): STG=LTG due to ELOS  Skilled Therapeutic Interventions/Progress Updates:  Pt received supine in bed and agreeable to PT. Supine>sit transfer with min assist. Pt performed stand pivot transfer to [facility] with min assist and no AD.   Gait training through day room with RW x 44f and 910fwith CGA-min assist for safety in turns.   Sit<>stand transfers with min A* throughout session to [facility].  Nustep reciprocal movement training x 5 min +2 min with 2 min rest break between bouts and cues for decreased speed throughout.   Standing balance training/coordination to perform foot tap on 1 of 3 cones x 5 each and to complete peg board puzzle with min cues for awareness of mistakes. Patient returned to room and performed stand pivot to recliner with min assist and no AD . Pt left sitting in recliner with call bell in reach and all needs met.       Therapy Documentation Precautions:  Precautions Precautions: Fall, Other (comment) Precaution Comments: h/o orthostatic hypotension and syncope during admission Restrictions Weight Bearing Restrictions: No  Pain: denies Trunk/Postural Assessment : Cervical Assessment Cervical Assessment: Exceptions to [facility]) Thoracic Assessment Thoracic Assessment: Exceptions to [facility] shoulders) Lumbar Assessment Lumbar Assessment: Exceptions to [facility]stonpost pelvic tilt) Postural Control Postural Control: Deficits on evaluation (delayed)     Therapy/Group: Individual Therapy  AuLorie Phenix0/02/2020, 5:45 PM

## 2020-11-19 NOTE — Evaluation (Signed)
Occupational Therapy Assessment and Plan  Patient Details  Name: Jenna Long MRN: [id number] Date of Birth: August 27, 1934  OT Diagnosis: abnormal posture, ataxia, cognitive deficits, disturbance of vision, and muscle weakness (generalized) Rehab Potential: Rehab Potential (ACUTE ONLY): Good ELOS: 7-10   Today's Date: 11/19/2020 OT Individual Time: 0800-0900 OT Individual Time Calculation (min): 60 min     Today's Date: 11/19/2020 OT Individual Time: 1030-1100 OT Individual Time Calculation (min): 30 min     Hospital Problem: Principal Problem:   Temporal arteritis ([place]) Active Problems:   H/O temporal arteritis   Past Medical History:  Past Medical History:  Diagnosis Date   Diabetes mellitus type II    DNR (do not resuscitate) 11/08/2020   Hypercholesteremia    Hypertension    Past Surgical History:  Past Surgical History:  Procedure Laterality Date   ABDOMINAL HYSTERECTOMY  1991   [facility], [place]   left, [place]   ARTERY BIOPSY Right 11/09/2020   Procedure: BIOPSY TEMPORAL ARTERY;  Surgeon: Cherre Robins, MD;  Location: [place];  Service: Vascular;  Laterality: Right;   CATARACT EXTRACTION W/ INTRAOCULAR LENS IMPLANT  06/10/06    Right, [facility], [place]   CATARACT EXTRACTION W/PHACO  09/18/2010   Procedure: CATARACT EXTRACTION PHACO AND INTRAOCULAR LENS PLACEMENT ([place]);  Surgeon: Williams Che;  Location: [facility];  Service: Ophthalmology;  Laterality: Left;   COLONOSCOPY W/ POLYPECTOMY  06/20/10   [facility], [place]   HIP ARTHROPLASTY Left 02/18/2020   Procedure: ARTHROPLASTY BIPOLAR HIP (HEMIARTHROPLASTY);  Surgeon: Mordecai Rasmussen, MD;  Location: [facility];  Service: Orthopedics;  Laterality: Left;    Assessment & Plan Clinical Impression: Pt is 86 y/o female presented to ED on 9/20 for R vision loss. Was being sent to opthamologist, however office was closed prior to arrival and decided to come to ED. 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 MRI orbits revelaed abnormal enhancement along optic nerve sheaths bilaterally, compatible with bilateral optic nerve perineuritis. 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 PMH: HTN, HLD, DM   Patient currently requires min with basic self-care skills secondary to muscle weakness, decreased cardiorespiratoy endurance, impaired timing and sequencing, unbalanced muscle activation, ataxia, and decreased coordination, decreased visual acuity, decreased visual perceptual skills, and decreased visual motor skills, decreased attention to right, decreased attention, decreased awareness, decreased problem solving, decreased safety awareness, and decreased memory, and decreased sitting balance, decreased standing balance, decreased postural control, and decreased balance strategies.  Prior to hospitalization, patient could complete BADL/IADL with modified independent .  Patient will benefit from skilled intervention to decrease level of assist with basic self-care skills and increase independence with basic self-care skills prior to discharge home with care partner.  Anticipate patient will require 24 hour supervision and follow up home health.  OT - End of Session Activity Tolerance: Tolerates 30+ min activity with multiple rests Endurance Deficit: Yes OT Assessment Rehab Potential (ACUTE ONLY): Good OT Barriers to Discharge: Decreased caregiver support;Incontinence OT Patient demonstrates impairments in the following area(s): Balance;Cognition;Endurance;Motor;Nutrition;Pain;Perception;Safety;Vision OT Basic ADL's Functional Problem(s): Grooming;Bathing;Dressing;Toileting OT Transfers Functional Problem(s): Toilet OT Plan OT Intensity: Minimum of 1-2 x/day, 45 to 90 minutes OT Frequency: 5 out of 7 days OT Duration/Estimated Length of Stay: 7-10 OT Treatment/Interventions: Balance/vestibular training;Discharge  planning;Self Care/advanced ADL retraining;Therapeutic Activities;UE/LE Coordination activities;Therapeutic Exercise;Visual/perceptual remediation/compensation;Skin care/wound managment;Patient/family education;Functional mobility training;Disease mangement/prevention;Cognitive remediation/compensation;Community reintegration;DME/adaptive equipment instruction;Neuromuscular re-education;Psychosocial support;Splinting/orthotics;UE/LE Strength  taining/ROM;Wheelchair propulsion/positioning OT Self Feeding Anticipated Outcome(s): no goal OT Basic Self-Care Anticipated Outcome(s): S OT Toileting Anticipated Outcome(s): S OT Bathroom Transfers Anticipated Outcome(s): S OT Recommendation Patient destination: Home Follow Up Recommendations: Home health OT Equipment Details: pt has shower chair, elevated toilet and SPC- does not shower   OT Evaluation Precautions/Restrictions  Precautions Precautions: Fall;Other (comment) Precaution Comments: h/o orthostatic hypotension and syncope during admission General Chart Reviewed: Yes Family/Caregiver Present: No Vital Signs  Pain Pain Assessment Pain Score: 0-No pain Home Living/Prior Functioning Home Living Type of Home: House Home Access: Stairs to enter Entrance Stairs-Number of Steps: 3; pt reporting no steps in front Entrance Stairs-Rails: Right, Left, Can reach both Home Layout: One level Bathroom Shower/Tub: Tub/shower unit, Door [employer] Toilet: Handicapped height Bathroom Accessibility: Yes Additional Comments: bathes at sink due to tub being too tall to step over IADL History Homemaking Responsibilities: Yes Meal Prep Responsibility: Primary Laundry Responsibility: Primary Cleaning Responsibility: Primary Bill Paying/Finance Responsibility: Primary Shopping Responsibility: Primary Current License: No Leisure and Hobbies: go tochurch non sunday Prior Function Level of Independence: Independent with basic ADLs, Independent with  homemaking with ambulation, Requires assistive device for independence Comments: Patient states independent with ADL and community ambulator with SPC. Performs grocery shopping, manages own medications. Patient states granddaughter will be coming to stay with her at dischage. Reports one fall 9 months ago resulting in hip fx (said she fell outside, crawled to porch and stayed there all night) Vision Baseline Vision/History: 1 Wears glasses Ability to See in Adequate Light: 2 Moderately impaired Patient Visual Report: Other (comment) Vision Assessment?: Vision impaired- to be further tested in functional context;Yes Eye Alignment: Impaired (comment) Ocular Range of Motion: Within Functional Limits;Other (comment) (L eye;) Tracking/Visual Pursuits: Decreased smoothness of vertical tracking;Decreased smoothness of horizontal tracking Saccades: Undershoots Convergence: Impaired (comment) Additional Comments: 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 Perception  Perception: Impaired Inattention/Neglect: Does not attend to right visual field Praxis Praxis: Intact Cognition Overall Cognitive Status: Impaired/Different from baseline Orientation Level: Person;Place;Situation Person: Oriented Place: Oriented Situation: Oriented Year: 2022 Month: October Day of Week: Correct Memory: Appears intact Immediate Memory Recall: Sock;Blue;Bed Memory Recall Sock: Not able to recall Memory Recall Blue: Without Cue Memory Recall Bed: Without Cue Awareness: Impaired Problem Solving: Impaired Safety/Judgment: Impaired Sensation Sensation Light Touch: Appears Intact Proprioception: Appears Intact Coordination Gross Motor Movements are Fluid and Coordinated: Yes Fine Motor Movements are Fluid and Coordinated: No Finger Nose Finger Test: RUE decreased smoothness- mild ataxia/dysmetria Motor  Motor Motor: Hemiplegia;Abnormal postural alignment and control  Trunk/Postural Assessment  Cervical  Assessment Cervical Assessment:  (head forward) Thoracic Assessment Thoracic Assessment:  (rounded shoulders) Lumbar Assessment Lumbar Assessment:  (post pelvic tilt) Postural Control Postural Control: Deficits on evaluation (delayed)  Balance Balance Balance Assessed: Yes Dynamic Sitting Balance Dynamic Sitting - Level of Assistance: 5: Stand by assistance Static Standing Balance Static Standing - Level of Assistance: 5: Stand by assistance;4: Min assist Dynamic Standing Balance Dynamic Standing - Level of Assistance: 4: Min assist;3: Mod assist Extremity/Trunk Assessment RUE Assessment RUE Assessment: Exceptions to [facility] General Strength Comments: decreased coordination; mild ataxia RUE Body System: Neuro Brunstrum levels for arm and hand: Hand;Arm Brunstrum level for arm: Stage V Relative Independence from Synergy Brunstrum level for hand: Stage VI Isolated joint movements LUE Assessment LUE Assessment: Exceptions to [facility] General Strength Comments: decrease coordination (better than RUE)  Care Tool Care Tool Self Care Eating   Eating Assist Level: Set up assist  Oral Care    Oral Care Assist Level: Minimal Assistance - Patient > 75%    Bathing   Body parts bathed by patient: Right arm;Left arm;Chest;Abdomen;Front perineal area;Buttocks;Right upper leg;Left upper leg;Face Body parts bathed by helper: Left lower leg;Right lower leg   Assist Level: Minimal Assistance - Patient > 75%    Upper Body Dressing(including orthotics)   What is the patient wearing?: Pull over shirt   Assist Level: Supervision/Verbal cueing    Lower Body Dressing (excluding footwear)   What is the patient wearing?: Pants Assist for lower body dressing: Minimal Assistance - Patient > 75%    Putting on/Taking off footwear   What is the patient wearing?: Socks;Shoes Assist for footwear: Moderate Assistance - Patient 50 - 74%       Care Tool Toileting Toileting activity   Assist for  toileting: Minimal Assistance - Patient > 75%     Care Tool Bed Mobility Roll left and right activity        Sit to lying activity        Lying to sitting on side of bed activity         Care Tool Transfers Sit to stand transfer        Chair/bed transfer         Toilet transfer   Assist Level: Minimal Assistance - Patient > 75%     Care Tool Cognition  Expression of Ideas and Wants Expression of Ideas and Wants: 3. Some difficulty - exhibits some difficulty with expressing needs and ideas (e.g, some words or finishing thoughts) or speech is not clear  Understanding Verbal and Non-Verbal Content Understanding Verbal and Non-Verbal Content: 3. Usually understands - understands most conversations, but misses some part/intent of message. Requires cues at times to understand   Memory/Recall Ability Memory/Recall Ability : That he or she is in a hospital/hospital unit   Refer to Care Plan for Long Term Goals  SHORT TERM GOAL WEEK 1 OT Short Term Goal 1 (Week 1): STG = LTG d/t ELOS  Recommendations for other services: Therapeutic Recreation  Pet therapy, Kitchen group, and Outing/community reintegration   Skilled Therapeutic Intervention Session 1 Pt received in bed awake, with no pain ready to go. Pt completes BADL at EOB as stated below with reacher provided as this is how pt dresses at home. Pt requires tacile facilitation at pelvis for sitting tall with upright posture. Pt requires CGA- MIN A for sit to stand at the EOB and min to mod at facilitation at pelvis for weight shifting for stepping mobility to sink with SPC. Pt with small narrow stepping/BOS and states "I dont know why im walking like this." Edu re OT role/purpose, CVA recovery, CIR and POC. Exited session with pt seated in recliner, exit alarm on and call light in reach   Session 2: Pt received in recliner with  no pain reported  Therapeutic activity BUE hand assessments from seated in the  recliner:  9HPT RUE 37.5 LUE 36.5  Box and blocks: RUE 33 LUE 36  Dynamometer RUE average over 3 trials: 26.5# LUE 20# Pt left at end of session in recliner with exit alarm on, call light in reach and all needs met   ADL ADL Grooming: Minimal assistance Where Assessed-Grooming: Standing at sink Upper Body Bathing: Supervision/safety Where Assessed-Upper Body Bathing: Edge of bed Lower Body Bathing: Moderate assistance Where Assessed-Lower Body Bathing: Edge of bed Upper Body Dressing: Supervision/safety Where Assessed-Upper Body Dressing: Edge of bed Lower  Body Dressing: Minimal assistance Where Assessed-Lower Body Dressing: Edge of bed Toileting: Minimal assistance Where Assessed-Toileting: Glass blower/designer: Psychiatric nurse Method: Counselling psychologist: Grab bars Mobility  Bed Mobility Bed Mobility: Sit to Supine;Supine to Sit Supine to Sit: Minimal Assistance - Patient > 75% Sit to Supine: Minimal Assistance - Patient > 75% Transfers Sit to Stand: Minimal Assistance - Patient > 75% Stand to Sit: Minimal Assistance - Patient > 75%   Discharge Criteria: Patient will be discharged from OT if patient refuses treatment 3 consecutive times without medical reason, if treatment goals not met, if there is a change in medical status, if patient makes no progress towards goals or if patient is discharged from hospital.  The above assessment, treatment plan, treatment alternatives and goals were discussed and mutually agreed upon: by patient  Tonny Branch 11/19/2020, 11:19 AM

## 2020-11-19 NOTE — Evaluation (Signed)
Physical Therapy Assessment and Plan  Patient Details  Name: Jenna Long MRN: [id number] Date of Birth: [birth year]  PT Diagnosis: Abnormal posture, Abnormality of gait, Ataxia, Ataxic gait, Coordination disorder, Hemiplegia dominant, Impaired sensation, and Muscle weakness Rehab Potential: Good ELOS: 7-10 days   Today's Date: 11/19/2020 PT Individual Time: 1305-1400 PT Individual Time Calculation (min): 55 min    Hospital Problem: Principal Problem:   Temporal arteritis ([place]) Active Problems:   H/O temporal arteritis   Past Medical History:  Past Medical History:  Diagnosis Date   Diabetes mellitus type II    DNR (do not resuscitate) 11/08/2020   Hypercholesteremia    Hypertension    Past Surgical History:  Past Surgical History:  Procedure Laterality Date   ABDOMINAL HYSTERECTOMY  1991   [facility], [facility]   left, [place]   ARTERY BIOPSY Right 11/09/2020   Procedure: BIOPSY TEMPORAL ARTERY;  Surgeon: Cherre Robins, MD;  Location: [place];  Service: Vascular;  Laterality: Right;   CATARACT EXTRACTION W/ INTRAOCULAR LENS IMPLANT  06/10/06    Right, [facility], [place]   CATARACT EXTRACTION W/PHACO  09/18/2010   Procedure: CATARACT EXTRACTION PHACO AND INTRAOCULAR LENS PLACEMENT ([place]);  Surgeon: Williams Che;  Location: [facility];  Service: Ophthalmology;  Laterality: Left;   COLONOSCOPY W/ POLYPECTOMY  06/20/10   [facility], [place]   HIP ARTHROPLASTY Left 02/18/2020   Procedure: ARTHROPLASTY BIPOLAR HIP (HEMIARTHROPLASTY);  Surgeon: Mordecai Rasmussen, MD;  Location: [facility];  Service: Orthopedics;  Laterality: Left;    Assessment & Plan Clinical Impression: Patient is a 86 year old right-handed female with history of hypertension, hyperlipidemia, diabetes mellitus and left hip hemiarthroplasty 02/18/2020 after a fall.  History taken from chart review patient.  Patient lives alone.  1 level home 3 steps to entry.  Independent with assistive device.  She performs her own grocery  shopping and manages medications.  Her granddaughter will be providing assistance on discharge.  She presented on 11/07/20 with vision right eye.  She denied any headache but did have some temporal discomfort x 2 weeks since her sinuses were giving her problems.  CT/MRI showed 2 subcentimeter acute infarcts within the right inferior cerebellar hemisphere.  Signal abnormality within the right vertebral artery at the level of the foramen magnum suggesting high-grade stenosis or vessel occlusion at that site.  Chronic lacunar infarcts within the left cerebral hemispheric white matter bilateral basal ganglia and bilateral thalami.  MRI orbits abnormal enhancement along the optic nerve sheaths bilaterally, compatible with bilateral optic nerve perineuritis.  CT angiogram head and neck severe stenosis of the mid and distal basilar artery and bilateral P2 PCAs.  Severe stenosis of distal M1 and proximal M2 MCA branches bilaterally.  Severe stenosis of the left A2 ACA.  Multifocal severe stenosis of the right vertebral artery in the neck with occlusion at the C2-3 level and irregular opacification of a small V3/V4 vertebral artery from collaterals.  Bilateral carotid bifurcation atherosclerosis without greater than 50% stenosis.  Echocardiogram with ejection fraction of 70-75%, no wall motion abnormalities grade 1 diastolic dysfunction.  Her admission chemistries were unremarkable except sodium 134 glucose 205, hemoglobin 10, sedimentation rate 111, C-reactive protein 8.6, hemoglobin A1c 6.3.  Neurology follow-up concern for temporal arteriolitis with right temporal artery biopsy completed 11/09/2020 per Dr. Stanford Breed.  She did complete a 3-day course of Solu-Medrol and started on prednisone 60 mg daily..  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  Plan is for very slow  taper of prednisone decreased to 10 mg every 2 weeks at follow-up with rheumatology as well as neurology.  Patient did have an episode of  orthostatic hypotension while standing at the sink 11/11/2020 dropping to 97 and monitored. Patient transferred to CIR on 11/18/2020 .   Patient currently requires min with mobility secondary to muscle weakness and muscle joint tightness, decreased cardiorespiratoy endurance, decreased coordination, decreased visual acuity, decreased visual perceptual skills, decreased visual motor skills, and field cut, decreased attention to right, and decreased sitting balance, decreased standing balance, decreased postural control, hemiplegia, and decreased balance strategies.  Prior to hospitalization, patient was modified independent  with mobility and lived with   in a House home.  Home access is 3; pt reporting no steps in frontStairs to enter.  Patient will benefit from skilled PT intervention to maximize safe functional mobility, minimize fall risk, and decrease caregiver burden for planned discharge home with 24 hour supervision.  Anticipate patient will benefit from follow up [place] at discharge.  PT - End of Session Activity Tolerance: Tolerates 10 - 20 min activity with multiple rests Endurance Deficit: Yes PT Assessment Rehab Potential (ACUTE/IP ONLY): Good PT Barriers to Discharge: [place] home environment;Decreased caregiver support;Home environment access/layout;Insurance for SNF coverage;Behavior PT Patient demonstrates impairments in the following area(s): Balance;Behavior;Endurance;Motor;Perception;Safety;Sensory;Skin Integrity PT Transfers Functional Problem(s): Bed Mobility;Bed to Chair;Car;Furniture;Floor PT Locomotion Functional Problem(s): Ambulation;Wheelchair Mobility;Stairs PT Plan PT Intensity: Minimum of 1-2 x/day ,45 to 90 minutes PT Frequency: 5 out of 7 days PT Duration Estimated Length of Stay: 7-10 days PT Treatment/Interventions: Ambulation/gait training;Community reintegration;DME/adaptive equipment instruction;Neuromuscular re-education;Psychosocial support;UE/LE Strength  taining/ROM;Stair training;Wheelchair propulsion/positioning;Balance/vestibular training;Cognitive remediation/compensation;Discharge planning;Disease management/prevention;Functional electrical stimulation;Functional mobility training;Pain management;Patient/family education;Therapeutic Exercise;Therapeutic Activities;Skin care/wound management;Visual/perceptual remediation/compensation;UE/LE Coordination activities;Splinting/orthotics PT Transfers Anticipated Outcome(s): Supervision assist with LRAD PT Locomotion Anticipated Outcome(s): Supervision assist ambulatory with LRAD PT Recommendation Recommendations for Other Services: Therapeutic Recreation consult Therapeutic Recreation Interventions: Stress management;Outing/community reintergration Follow Up Recommendations: Home health PT Patient destination: Home Equipment Recommended: To be determined;Rolling walker with 5" wheels   PT Evaluation Precautions/Restrictions Restrictions Weight Bearing Restrictions: No General   Vital SignsTherapy Vitals Temp: (!) 97.5 F (36.4 C) Temp Source: Oral Pulse Rate: 77 Resp: 18 BP: (!) 149/82 Patient Position (if appropriate): Sitting Oxygen Therapy SpO2: 94 % O2 Device: Room Air Pain Pain Assessment Pain Scale: 0-10 Pain Score: 0-No pain Pain Interference Pain Interference Pain Effect on Sleep: 1. Rarely or not at all Pain Interference with Therapy Activities: 2. Occasionally Pain Interference with Day-to-Day Activities: 2. Occasionally Home Living/Prior Functioning Home Living Type of Home: House Home Access: Stairs to enter Entrance Stairs-Number of Steps: 3; pt reporting no steps in front Entrance Stairs-Rails: Right;Left;Can reach both Home Layout: One level Bathroom Shower/Tub: Tub/shower unit;Door Bathroom Toilet: Handicapped height Bathroom Accessibility: Yes Additional Comments: bathes at sink due to tub being too tall to step over Prior Function Level of  Independence: Independent with basic ADLs;Independent with homemaking with ambulation;Requires assistive device for independence  Able to Take Stairs?: Yes Driving: No Vocation: Retired Comments: Patient states independent with ADL and community ambulator with [place]. Performs grocery shopping, manages own medications. Patient states granddaughter will be coming to stay with her at dischage. Reports one fall 9 months ago resulting in hip fx (said she fell outside, crawled to porch and stayed there all night) Vision/Perception  Vision - Assessment Eye Alignment: Impaired (comment) Tracking/Visual Pursuits: Decreased smoothness of vertical tracking;Decreased smoothness of horizontal tracking Saccades: Undershoots Convergence: Impaired (comment) Perception Inattention/Neglect: Does not attend to right  visual field Praxis Praxis: Intact  Cognition Overall Cognitive Status: Impaired/Different from baseline Orientation Level: Oriented X4 Year: 2022 Month: October Day of Week: Correct Safety/Judgment: Impaired Comments: mild decreased awareness of deficits Sensation Sensation Light Touch: Appears Intact Proprioception: Appears Intact Coordination Gross Motor Movements are Fluid and Coordinated: No Fine Motor Movements are Fluid and Coordinated: No Coordination and Movement Description: RLE decreased smoothness- mild ataxia/dysmetria Heel Shin Test: poor ability to follow instructions. mild dysmetria RLE Motor  Motor Motor: Hemiplegia;Abnormal postural alignment and control   Trunk/Postural Assessment  Cervical Assessment Cervical Assessment: Exceptions to [facility] (head forward) Thoracic Assessment Thoracic Assessment: Exceptions to [facility] (rounded shoulders) Lumbar Assessment Lumbar Assessment: Exceptions to [facility] (post pelvic tilt) Postural Control Postural Control: Deficits on evaluation (delayed)  Balance Balance Balance Assessed: Yes Standardized Balance Assessment Standardized  Balance Assessment: Berg Balance Test Berg Balance Test Sit to Stand: Needs minimal aid to stand or to stabilize Standing Unsupported: Able to stand 30 seconds unsupported Sitting with Back Unsupported but Feet Supported on Floor or Stool: Able to sit safely and securely 2 minutes Stand to Sit: Controls descent by using hands Transfers: Needs one person to assist Standing Unsupported with Eyes Closed: Able to stand 3 seconds Standing Ubsupported with Feet Together: Needs help to attain position but able to stand for 30 seconds with feet together From Standing, Reach Forward with Outstretched Arm: Reaches forward but needs supervision From Standing Position, Pick up Object from Floor: Unable to try/needs assist to keep balance From Standing Position, Turn to Look Behind Over each Shoulder: Looks behind one side only/other side shows less weight shift Turn 360 Degrees: Needs assistance while turning Standing Unsupported, Alternately Place Feet on Step/Stool: Needs assistance to keep from falling or unable to try Standing Unsupported, One Foot in Front: Needs help to step but can hold 15 seconds Standing on One Leg: Tries to lift leg/unable to hold 3 seconds but remains standing independently Total Score: 20 Dynamic Sitting Balance Dynamic Sitting - Level of Assistance: 5: Stand by assistance Static Standing Balance Static Standing - Level of Assistance: 5: Stand by assistance;4: Min assist Dynamic Standing Balance Dynamic Standing - Level of Assistance: 4: Min assist;3: Mod assist Extremity Assessment      RLE Assessment RLE Assessment: Within Functional Limits General Strength Comments: grossly 4+/5 LLE Assessment LLE Assessment: Exceptions to [facility] General Strength Comments: hx of L hip fx and repair. grossly 4-/5 to 4/5  Care Tool Care Tool Bed Mobility Roll left and right activity   Roll left and right assist level: Minimal Assistance - Patient > 75%    Sit to lying activity    Sit to lying assist level: Minimal Assistance - Patient > 75%    Lying to sitting on side of bed activity   Lying to sitting on side of bed assist level: the ability to move from lying on the back to sitting on the side of the bed with no back support.: Minimal Assistance - Patient > 75%     Care Tool Transfers Sit to stand transfer   Sit to stand assist level: Minimal Assistance - Patient > 75%    Chair/bed transfer   Chair/bed transfer assist level: Minimal Assistance - Patient > 75%     Toilet transfer   Assist Level: Minimal Assistance - Patient > 75%    Car transfer   Car transfer assist level: Moderate Assistance - Patient 50 - 74%      Care Tool Locomotion Ambulation   Assist  level: Minimal Assistance - Patient > 75% Assistive device: Hand held assist Max distance: 10  Walk 10 feet activity   Assist level: Minimal Assistance - Patient > 75% Assistive device: Hand held assist   Walk 50 feet with 2 turns activity Walk 50 feet with 2 turns activity did not occur: Safety/medical concerns      Walk 150 feet activity Walk 150 feet activity did not occur: Safety/medical concerns      Walk 10 feet on uneven surfaces activity Walk 10 feet on uneven surfaces activity did not occur: Safety/medical concerns      Stairs Stair activity did not occur: Safety/medical concerns        Walk up/down 1 step activity Walk up/down 1 step or curb (drop down) activity did not occur: Safety/medical concerns     Walk up/down 4 steps activity did not occuR: Safety/medical concerns  Walk up/down 4 steps activity      Walk up/down 12 steps activity Walk up/down 12 steps activity did not occur: Safety/medical concerns      Pick up small objects from floor Pick up small object from the floor (from standing position) activity did not occur: Safety/medical concerns      Wheelchair Is the patient using a wheelchair?: No Type of Wheelchair: Manual   Wheelchair assist level: Minimal  Assistance - Patient > 75% Max wheelchair distance: 150  Wheel 50 feet with 2 turns activity   Assist Level: Minimal Assistance - Patient > 75%  Wheel 150 feet activity   Assist Level: Minimal Assistance - Patient > 75%    Refer to Care Plan for Long Term Goals  SHORT TERM GOAL WEEK 1 PT Short Term Goal 1 (Week 1): STG=LTG due to ELOS  Recommendations for other services: Therapeutic Recreation  Stress management and Outing/community reintegration  Skilled Therapeutic Intervention  Pt received supine in bed and agreeable to PT. Supine>sit transfer with min assist and cues for use of RUE. PT instructed patient in PT Evaluation and initiated treatment intervention; see above for results. PT educated patient in [place], rehab potential, rehab goals, and discharge recommendations along with recommendation for follow-up rehabilitation services.   Pt transported to rehab gym in [facility]. Gait training with SPC in gym x 21ft with min assist. Gait without UE support x 34ft and min assist. Mild pain in the LLE due to hx of hip fx. Stair management with min-mod assist from PT as pt attempted perform step through on the LLE causing pain 2/10. Patient demonstrates increased fall risk as noted by score of   20/56 on Berg Balance Scale.  (<36= high risk for falls, close to 100%; 37-45 significant >80%; 46-51 moderate >50%; 52-55 lower >25%) car transfer with min-mod assist from PT due to mild lateral LOB. Pt returned to room and performed stand pivot transfer to bed with min assist. Sit>supine completed with min assist and left supine in bed with call bell in reach and all needs met.      Mobility Bed Mobility Bed Mobility: Sit to Supine;Supine to Sit Supine to Sit: Minimal Assistance - Patient > 75% Sit to Supine: Minimal Assistance - Patient > 75% Transfers Transfers: Sit to Stand;Stand to Sit;Stand Pivot Transfers Sit to Stand: Minimal Assistance - Patient > 75% Stand to Sit: Minimal Assistance - Patient >  75% Stand Pivot Transfers: Minimal Assistance - Patient > 75% Stand Pivot Transfer Details: Verbal cues for gait pattern Transfer (Assistive device): 1 person hand held assist Locomotion  Gait Ambulation: Yes  Gait Assistance: Minimal Assistance - Patient > 75% Gait Distance (Feet): 75 Feet Assistive device: Straight cane Gait Gait: Yes Gait Pattern: Impaired Gait Pattern: Narrow base of support;Ataxic;Lateral hip instability Stairs / Additional Locomotion Stairs: Yes Stairs Assistance: Minimal Assistance - Patient > 75%;Moderate Assistance - Patient 50 - 74% Stair Management Technique: Two rails Number of Stairs: 4 Height of Stairs: 6 Wheelchair Mobility Wheelchair Mobility: Yes Wheelchair Assistance: Minimal assistance - Patient >75% Wheelchair Propulsion: Both upper extremities Wheelchair Parts Management: Needs assistance Distance: 150   Discharge Criteria: Patient will be discharged from PT if patient refuses treatment 3 consecutive times without medical reason, if treatment goals not met, if there is a change in medical status, if patient makes no progress towards goals or if patient is discharged from hospital.  The above assessment, treatment plan, treatment alternatives and goals were discussed and mutually agreed upon: by patient  Lorie Phenix 11/19/2020, 2:31 PM

## 2020-11-20 LAB — GLUCOSE, CAPILLARY
Glucose-Capillary: 63 mg/dL — ABNORMAL LOW (ref 70–99)
Glucose-Capillary: 86 mg/dL (ref 70–99)
Glucose-Capillary: 192 mg/dL — ABNORMAL HIGH (ref 70–99)
Glucose-Capillary: 304 mg/dL — ABNORMAL HIGH (ref 70–99)
Glucose-Capillary: 231 mg/dL — ABNORMAL HIGH (ref 70–99)

## 2020-11-20 MED ORDER — BISACODYL 10 MG RE SUPP
10.0000 mg | Freq: Every day | RECTAL | Status: DC | PRN
  Administered 2020-11-20: 10 mg via RECTAL
  Filled 2020-11-20: qty 1

## 2020-11-20 MED ORDER — SORBITOL 70 % SOLN
30.0000 mL | Freq: Once | Status: AC
  Administered 2020-11-20: 30 mL via ORAL
  Filled 2020-11-20: qty 30

## 2020-11-20 NOTE — Progress Notes (Signed)
Occupational Therapy Session Note  Patient Details  Name: Jenna Long MRN: [id number] Date of Birth: 07/28/1934  Today's Date: 11/21/2020 OT Individual Time: 1035-1130 and 1330-1426 OT Individual Time Calculation (min): 55 min and 56 min  Short Term Goals: Week 1:  OT Short Term Goal 1 (Week 1): STG = LTG d/t ELOS  Skilled Therapeutic Interventions/Progress Updates:    Pt greeted in the recliner with no c/o pain, ADL needs met. She wanted to walk, did not want to work on IADLs in the room or kitchen today. Tried to use the shoe horn to increase functional independence with donning her Rt shoe however pt unable and did not appear receptive to education provided by OT in regards to how to use AE effectively. Stand pivot<w/c completed without AD, note that pt had a major LOB towards the Rt side and required Mod A to correct. Escorted pt via w/c to a community environment of the hospital for psychosocial health. Pt ambulated using RW in the food court and gift shop settings, also self propelled the w/c to work on UB strengthening/endurance and cognitive skills. Pt required vcs for symmetrical arm movement, technique for changing direction, and how to lock/unlock brakes during seated level mobility. Note that she required several rest breaks due to fatigue. She transferred to a backless and armless sofa seat with CGA. Note that pt had 1 posterior LOB due to thinking that the seat had a backrest, Mod A to correct balance loss. Note in the gift shop, pt often removed both UEs from her walker to touch clothing and items on display. She is an avid shopper, no LOBs here. Pt then ambulated back to her w/c using RW with CGA. She was returned to the room via w/c and then used her [place] to transfer to the bed, note that pt had 2 lateral LOBs while using cane, requiring Mod A to correct. Vcs also to improve eccentric control when lowering onto bed due to "plopping" down. She remained in bed at close of session, all  needs within reach and bed alarm set. Tx focus placed on UE strengthening, functional cognition, dynamic balance, and activity tolerance.   2nd Session 1:1 tx (56 min) Pt greeted in bed with no c/o pain. She reported that she has a flat bed without bedrails at home, Max A for supine<sit from flat bed without bedrails today, pt requiring max cues for technique though note pt did not listen to instruction. Once seated EOB, we donned her shoes with Min A for the Lt foot. Pt reported that she needed to use the restroom. CGA for ambulatory transfer to toilet using her cane, CGA for toileting tasks with pt having +bladder void. She wanted to focus on walking during session. Pt ambulated with RW to the tub shower room and we practiced tub shower transfers. Pt reports that she has a large seat in her shower at home but "it takes up half the shower" space. Pt reported that she'd have to side-step into the tub. After performing a TTB transfer using device, pt reported that she preferred this seating option for showering at home vs the seat they already have. Per pt: "Hot diggity, I like this thing!" Afterwards pt ambulated to the therapy apartment. Practiced sit<stands and stand<sit transitions from couch and also recliner seat given vcs and Min-Mod A for power ups. Pt reports she uses both seating options at home. Pt then ambulated to the main gym area and sat in another chair, did much  better with controlled sitting after repetitive practice. Worked on functional cognition by having pt pathfind her way back to the room, which she did with min questioning cues. Pt transferred to the recliner and remained sitting up, left with all needs within reach and chair alarm set. Note that she is very impulsive with sit<stands and often stands without notifying therapist.   In total pt ambulated ~270 ft!  Therapy Documentation Precautions:  Precautions Precautions: Fall, Other (comment) Precaution Comments: h/o orthostatic  hypotension and syncope during admission Restrictions Weight Bearing Restrictions: No  ADL: ADL Grooming: Minimal assistance Where Assessed-Grooming: Standing at sink Upper Body Bathing: Supervision/safety Where Assessed-Upper Body Bathing: Edge of bed Lower Body Bathing: Moderate assistance Where Assessed-Lower Body Bathing: Edge of bed Upper Body Dressing: Supervision/safety Where Assessed-Upper Body Dressing: Edge of bed Lower Body Dressing: Minimal assistance Where Assessed-Lower Body Dressing: Edge of bed Toileting: Minimal assistance Where Assessed-Toileting: Glass blower/designer: Psychiatric nurse Method: Counselling psychologist: Grab bars    Therapy/Group: Individual Therapy  Biance Moncrief A Mishel Sans 11/21/2020, 12:46 PM

## 2020-11-20 NOTE — Discharge Instructions (Addendum)
Inpatient Rehab Discharge Instructions  [name] Discharge date and time: No discharge date for patient encounter.   Activities/Precautions/ Functional Status: Activity: As tolerated Diet: Diabetic diet Wound Care: Routine skin checks Functional status:  ___ No restrictions     ___ Walk up steps independently ___ 24/7 supervision/assistance   ___ Walk up steps with assistance ___ Intermittent supervision/assistance  ___ Bathe/dress independently ___ Walk with walker     _x__ Bathe/dress with assistance ___ Walk Independently    ___ Shower independently ___ Walk with assistance    ___ Shower with assistance ___ No alcohol     ___ Return to work/school ________  COMMUNITY REFERRALS UPON DISCHARGE:    Outpatient: PT     OT  ST                Agency: [facility]  TRZNB:[phone]              Appointment Date/Time: TBD    Special Instructions: No driving smoking or alcohol  Ambulatory referral obtained with rheumatology services   My questions have been answered and I understand these instructions. I will adhere to these goals and the provided educational materials after my discharge from the hospital.  Patient/Caregiver Signature _______________________________ Date __________  Clinician Signature _______________________________________ Date __________  Please bring this form and your medication list with you to all your follow-up doctor's appointments.

## 2020-11-20 NOTE — Progress Notes (Signed)
Hypoglycemic Event  CBG: 63   Treatment: 4 oz juice/soda  Symptoms: None  Follow-up CBG: [id number] CBG Result:86  Possible Reasons for Event: Unknown  Comments/MD notified:MD lovorn notified.     Zacaria Pousson, [place]

## 2020-11-21 DIAGNOSIS — E119 Type 2 diabetes mellitus without complications: Secondary | ICD-10-CM

## 2020-11-21 DIAGNOSIS — I10 Essential (primary) hypertension: Secondary | ICD-10-CM

## 2020-11-21 LAB — CBC WITH DIFFERENTIAL/PLATELET
Abs Immature Granulocytes: 0.05 10*3/uL (ref 0.00–0.07)
Basophils Absolute: 0 10*3/uL (ref 0.0–0.1)
Basophils Relative: 0 %
Eosinophils Absolute: 0 10*3/uL (ref 0.0–0.5)
Eosinophils Relative: 0 %
HCT: 33.1 % — ABNORMAL LOW (ref 36.0–46.0)
Hemoglobin: 10.8 g/dL — ABNORMAL LOW (ref 12.0–15.0)
Immature Granulocytes: 1 %
Lymphocytes Relative: 22 %
Lymphs Abs: 2 10*3/uL (ref 0.7–4.0)
MCH: 28.4 pg (ref 26.0–34.0)
MCHC: 32.6 g/dL (ref 30.0–36.0)
MCV: 87.1 fL (ref 80.0–100.0)
Monocytes Absolute: 0.6 10*3/uL (ref 0.1–1.0)
Monocytes Relative: 7 %
Neutro Abs: 6.1 10*3/uL (ref 1.7–7.7)
Neutrophils Relative %: 70 %
Platelets: 278 10*3/uL (ref 150–400)
RBC: 3.8 MIL/uL — ABNORMAL LOW (ref 3.87–5.11)
RDW: 15.7 % — ABNORMAL HIGH (ref 11.5–15.5)
WBC: 8.8 10*3/uL (ref 4.0–10.5)
nRBC: 0 % (ref 0.0–0.2)

## 2020-11-21 LAB — GLUCOSE, CAPILLARY
Glucose-Capillary: 86 mg/dL (ref 70–99)
Glucose-Capillary: 194 mg/dL — ABNORMAL HIGH (ref 70–99)
Glucose-Capillary: 305 mg/dL — ABNORMAL HIGH (ref 70–99)
Glucose-Capillary: 183 mg/dL — ABNORMAL HIGH (ref 70–99)

## 2020-11-21 LAB — COMPREHENSIVE METABOLIC PANEL
ALT: 29 U/L (ref 0–44)
AST: 19 U/L (ref 15–41)
Albumin: 2.5 g/dL — ABNORMAL LOW (ref 3.5–5.0)
Alkaline Phosphatase: 47 U/L (ref 38–126)
Anion gap: 7 (ref 5–15)
BUN: 21 mg/dL (ref 8–23)
CO2: 27 mmol/L (ref 22–32)
Calcium: 8.7 mg/dL — ABNORMAL LOW (ref 8.9–10.3)
Chloride: 105 mmol/L (ref 98–111)
Creatinine, Ser: 0.79 mg/dL (ref 0.44–1.00)
GFR, Estimated: >60 mL/min (ref >60)
Glucose, Bld: 87 mg/dL (ref 70–99)
Potassium: 3.7 mmol/L (ref 3.5–5.1)
Sodium: 139 mmol/L (ref 135–145)
Total Bilirubin: 0.6 mg/dL (ref 0.3–1.2)
Total Protein: 5.4 g/dL — ABNORMAL LOW (ref 6.5–8.1)

## 2020-11-21 MED ORDER — INSULIN GLARGINE-YFGN 100 UNIT/ML ~~LOC~~ SOLN
8.0000 [IU] | Freq: Every day | SUBCUTANEOUS | Status: DC
  Administered 2020-11-21 – 2020-11-22 (×2): 8 [IU] via SUBCUTANEOUS
  Filled 2020-11-21 (×3): qty 0.08

## 2020-11-21 MED ORDER — INSULIN GLARGINE-YFGN 100 UNIT/ML ~~LOC~~ SOLN
4.0000 [IU] | Freq: Once | SUBCUTANEOUS | Status: AC
  Administered 2020-11-21: 4 [IU] via SUBCUTANEOUS
  Filled 2020-11-21: qty 0.04

## 2020-11-21 MED ORDER — INSULIN GLARGINE-YFGN 100 UNIT/ML ~~LOC~~ SOLN
12.0000 [IU] | Freq: Every day | SUBCUTANEOUS | Status: DC
  Administered 2020-11-22 – 2020-11-25 (×4): 12 [IU] via SUBCUTANEOUS
  Filled 2020-11-21 (×4): qty 0.12

## 2020-11-21 NOTE — Progress Notes (Signed)
Occupational Therapy Session Note  Patient Details  Name: Jenna Long MRN: [id number] Date of Birth: June 13, 1934  Today's Date: 11/21/2020 OT Individual Time: 2633-3545 OT Individual Time Calculation (min): 26 min    Short Term Goals: Week 1:  OT Short Term Goal 1 (Week 1): STG = LTG d/t ELOS  Skilled Therapeutic Interventions/Progress Updates:  Patient met lying supine in bed in agreement with OT treatment session. 0/10 pain reported at rest and with activity. Patient reports episode of urinary incontinence in supine. Min guard for supine to EOB and Min guard for sit to stand from EOB to RW. Min guard for toilet transfer with BSC over standard height commode. Patient completed 3/3 parts of toileting task with Min A for hygiene/clothing management to doff soiled brief. UB dressing seated on commode with set-up assist and LB dressing with use of reacher (patient reports use of reacher at baseline) with Min A and increased time. Hand washing and oral hygiene standing at sink level with Min guard to supervision A with and without unilateral UE support on sink surface vs RW. Session concluded with patient seated in recliner with call bell within reach, belt alarm activated and all needs met.   Therapy Documentation Precautions:  Precautions Precautions: Fall, Other (comment) Precaution Comments: h/o orthostatic hypotension and syncope during admission Restrictions Weight Bearing Restrictions: No General:    Therapy/Group: Individual Therapy  Hanzel Pizzo R [place] 11/21/2020, 8:56 AM

## 2020-11-21 NOTE — Progress Notes (Signed)
Physical Therapy Session Note  Patient Details  Name: SYRIAH DELISI MRN: [id number] Date of Birth: [birth year]  Today's Date: 11/21/2020 PT Individual Time: 0906-1003 PT Individual Time Calculation (min): 57 min   Short Term Goals: Week 1:  PT Short Term Goal 1 (Week 1): STG=LTG due to ELOS  Skilled Therapeutic Interventions/Progress Updates:  Patient seated in recliner on entrance to room. Patient alert and agreeable to PT session.   Patient with no pain complaint throughout session.  Therapeutic Activity: Transfers: Patient performed sit<>stand and stand pivot transfers throughout session with CGA/ supervision. Provided verbal cues for forward lean/ technique.  Gait Training:  Patient ambulated 160 ft x2 using RW with CGA. Demonstrated slow pace with adequate bilateral step height and upright posture with forward gaze.  One instance of close proximity to wall/ handrail on R side of hall. Provided vc/ tc for scan to R in order to maintain safe distances, and watch for obstacles. Also cued to conserve energy with consistent slow pace rather than attempt to reach destination too quickly.  Neuromuscular Re-ed: NMR facilitated during session with focus on dynamic balance and coordination. Pt guided in alternating toe touches to 6" step. Performs well with good clearance on and off step bilaterally but even with increased physical assist for balance, pt is unwilling to attempt with no UE support. Guided in lateral step outs and forward reaching for objects.Demonstrates increased fear of falling and decreased confidence in balance without Bil UE support and unwilling to try progressions into one handed dynamic balance activities despite max encouragement.  NMR performed for improvements in motor control and coordination, balance, sequencing, judgement, and self confidence/ efficacy in performing all aspects of mobility at highest level of independence.   TUG completed in average of 43.71 seconds  (A score of >13.5 seconds indicates patient is at a high fall risk. Pt educated on interpretation of their score)   Patient seated upright  in recliner at end of session with brakes locked, belt alarm set, and all needs within reach.        Therapy Documentation Precautions:  Precautions Precautions: Fall, Other (comment) Precaution Comments: h/o orthostatic hypotension and syncope during admission Restrictions Weight Bearing Restrictions: No General:   Vital Signs: Therapy Vitals BP: (!) 156/64 Pain:   Mobility:   Locomotion :    Trunk/Postural Assessment :    Balance: Balance Balance Assessed: Yes Standardized Balance Assessment Standardized Balance Assessment: Timed Up and Go Test Timed Up and Go Test TUG: Normal TUG Normal TUG (seconds): 43.71 Exercises:   Other Treatments:      Therapy/Group: Individual Therapy  Alger Simons PT, DPT 11/21/2020, 10:26 AM

## 2020-11-21 NOTE — Progress Notes (Signed)
Inpatient Rehabilitation  Patient information reviewed and entered into eRehab system by Daveion Robar Sherlene Rickel, OTR/L.   Information including medical coding, functional ability and quality indicators will be reviewed and updated through discharge.    

## 2020-11-21 NOTE — Progress Notes (Signed)
Inpatient Rehabilitation Care Coordinator Assessment and Plan Patient Details  Name: Jenna Long MRN: [id number] Date of Birth: [birth year]  Today's Date: 11/21/2020  Hospital Problems: Principal Problem:   Temporal arteritis ([place]) Active Problems:   H/O temporal arteritis  Past Medical History:  Past Medical History:  Diagnosis Date   Diabetes mellitus type II    DNR (do not resuscitate) 11/08/2020   Hypercholesteremia    Hypertension    Past Surgical History:  Past Surgical History:  Procedure Laterality Date   ABDOMINAL HYSTERECTOMY  1991   [facility], [place]   left, [place]   ARTERY BIOPSY Right 11/09/2020   Procedure: BIOPSY TEMPORAL ARTERY;  Surgeon: Cherre Robins, MD;  Location: [place];  Service: Vascular;  Laterality: Right;   CATARACT EXTRACTION W/ INTRAOCULAR LENS IMPLANT  06/10/06    Right, [facility], [place]   CATARACT EXTRACTION W/PHACO  09/18/2010   Procedure: CATARACT EXTRACTION PHACO AND INTRAOCULAR LENS PLACEMENT ([place]);  Surgeon: Williams Che;  Location: [facility];  Service: Ophthalmology;  Laterality: Left;   COLONOSCOPY W/ POLYPECTOMY  06/20/10   [facility], [place]   HIP ARTHROPLASTY Left 02/18/2020   Procedure: ARTHROPLASTY BIPOLAR HIP (HEMIARTHROPLASTY);  Surgeon: Mordecai Rasmussen, MD;  Location: [facility];  Service: Orthopedics;  Laterality: Left;   Social History:  reports that she has never smoked. She has never used smokeless tobacco. She reports that she does not drink alcohol and does not use drugs.  Family / Support Systems Children: Aneta Mins (Daughter) Anticipated Caregiver: [place] Ability/Limitations of Caregiver: Min A Caregiver Availability: 24/7 Family Dynamics: Some support from family  Social History Preferred language: English Religion:  Health Literacy - How often do you need to have someone help you when you read instructions, pamphlets, or other written material from your doctor or pharmacy?: Never Writes: Yes Legal  History/Current Legal Issues: n/a Guardian/Conservator: daughter   Abuse/Neglect Abuse/Neglect Assessment Can Be Completed: Yes Physical Abuse: Denies Verbal Abuse: Denies Sexual Abuse: Denies Exploitation of patient/patient's resources: Denies Self-Neglect: Denies  Patient response to: Social Isolation - How often do you feel lonely or isolated from those around you?: Never  Emotional Status Pt's affect, behavior and adjustment status: pleasant Recent Psychosocial Issues: n/a Psychiatric History: n/a Substance Abuse History: n/a  Patient / Family Perceptions, Expectations & Goals Pt/Family understanding of illness & functional limitations: yes Premorbid pt/family roles/activities: Previously Independent with AD, Independent with shopping and med management Anticipated changes in roles/activities/participation: Daughter able to provide assistance at d/c Pt/family expectations/goals: [place]: None Premorbid Home Care/DME Agencies: Other (Comment) ([employer], Bedside [employer], Single [employer], Civil engineer, contracting and wheelchair) Transportation available at discharge: Family able to transport Is the patient able to respond to transportation needs?: Yes In the past 12 months, has lack of transportation kept you from medical appointments or from getting medications?: No In the past 12 months, has lack of transportation kept you from meetings, work, or from getting things needed for daily living?: No  Discharge Planning Living Arrangements: Alone Support Systems: Other relatives, Children (Granddaughter) Type of Residence: Private residence (1 level home, 3 steps) Insurance Resources: Multimedia programmer (specify) ([employer] Advantage) Financial Resources: SSD, Family Support Financial Screen Referred: No Living Expenses: Own Money Management: Patient Does the patient have any problems obtaining your medications?: No Home Management:  Independent Patient/Family Preliminary Plans: Granddaughter able to assist at discharge Care Coordinator Barriers to Discharge: Insurance for SNF coverage Care Coordinator Anticipated Follow  Up Needs: HH/OP Expected length of stay: 5-7 Days  Clinical Impression Sw met with pt at bedside, pt having lunch. Introduced self, explained role and addressed questions and concerns. Pt reports family will not be present today, but will be on tomorrow. SW left contact information, no additional questions or concerns. Sw will continue to follow up.   Dyanne Iha 11/21/2020, 12:17 PM

## 2020-11-21 NOTE — IPOC Note (Signed)
Overall Plan of Care [facility]) Patient Details Name: Jenna Long MRN: [id number] DOB: May 08, 1934  Admitting Diagnosis: Temporal arteritis [facility])  Hospital Problems: Principal Problem:   Temporal arteritis ([place]) Active Problems:   H/O temporal arteritis     Functional Problem List: Nursing Bladder, Medication Management, Safety, Endurance, Bowel  PT Balance, Behavior, Endurance, Motor, Perception, Safety, Sensory, Skin Integrity  OT Balance, Cognition, Endurance, Motor, Nutrition, Pain, Perception, Safety, Vision  SLP    TR         Basic ADL's: OT Grooming, Bathing, Dressing, Toileting     Advanced  ADL's: OT       Transfers: PT Bed Mobility, Bed to Chair, Car, [employer], Floor  OT Toilet     Locomotion: PT Ambulation, Emergency planning/management officer, Stairs     Additional Impairments: OT    SLP        TR      Anticipated Outcomes Item Anticipated Outcome  Self Feeding no goal  Swallowing      Basic self-care  S  Toileting  S   Bathroom Transfers S  Bowel/Bladder  manage bowel w mod I and bladder w min assist  Transfers  Supervision assist with LRAD  Locomotion  Supervision assist ambulatory with LRAD  Communication     Cognition     Pain  at or below level 4  Safety/Judgment  maintain safety w cues   Therapy Plan: PT Intensity: Minimum of 1-2 x/day ,45 to 90 minutes PT Frequency: 5 out of 7 days PT Duration Estimated Length of Stay: 7-10 days OT Intensity: Minimum of 1-2 x/day, 45 to 90 minutes OT Frequency: 5 out of 7 days OT Duration/Estimated Length of Stay: 7-10     Due to the current state of emergency, patients may not be receiving their 3-hours of Medicare-mandated therapy.   Team Interventions: Nursing Interventions Bladder Management, Disease Management/Prevention, Medication Management, Discharge Planning, Pain Management, Bowel Management, Patient/Family Education  PT interventions Ambulation/gait training, Community reintegration,  DME/adaptive equipment instruction, Neuromuscular re-education, Psychosocial support, UE/LE Strength taining/ROM, Stair training, Wheelchair propulsion/positioning, Training and development officer, Cognitive remediation/compensation, Discharge planning, Disease management/prevention, Functional electrical stimulation, Functional mobility training, Pain management, Patient/family education, Therapeutic Exercise, Therapeutic Activities, Skin care/wound management, Visual/perceptual remediation/compensation, UE/LE Coordination activities, Splinting/orthotics  OT Interventions Balance/vestibular training, Discharge planning, Self Care/advanced ADL retraining, Therapeutic Activities, UE/LE Coordination activities, Therapeutic Exercise, Visual/perceptual remediation/compensation, Skin care/wound managment, Patient/family education, Functional mobility training, Disease mangement/prevention, Cognitive remediation/compensation, Academic librarian, Engineer, drilling, Neuromuscular re-education, Psychosocial support, Splinting/orthotics, UE/LE Strength taining/ROM, Wheelchair propulsion/positioning  SLP Interventions    TR Interventions    SW/CM Interventions Discharge Planning, Psychosocial Support, Patient/Family Education, Disease Management/Prevention   Barriers to Discharge MD  Medical stability  Nursing Decreased caregiver support, Home environment access/layout, Incontinence 1 level 3 ste solo, grand daughter to check in and assist  PT Inaccessible home environment, Decreased caregiver support, Home environment Child psychotherapist, Insurance underwriter for SNF coverage, Behavior    OT Decreased caregiver support, Incontinence    SLP      SW Insurance for SNF coverage     Team Discharge Planning: Destination: PT-Home ,OT- Home , SLP-  Projected Follow-up: PT-Home health PT, OT-  Home health OT, SLP-  Projected Equipment Needs: PT-To be determined, Rolling walker with 5" wheels, OT-  , SLP-   Equipment Details: PT- , OT-pt has shower chair, elevated toilet and SPC- does not shower Patient/family involved in discharge planning: PT- Patient,  OT-Patient, SLP-   MD ELOS: 7-10 days Medical Rehab Prognosis:  Excellent  Assessment: The patient has been admitted for CIR therapies with the diagnosis of temporal artertitis, cva. The team will be addressing functional mobility, strength, stamina, balance, safety, adaptive techniques and equipment, self-care, bowel and bladder mgt, patient and caregiver education, NMR, community reentry. Goals have been set at supervision for mobility and self-care.   Due to the current state of emergency, patients may not be receiving their 3 hours per day of Medicare-mandated therapy.    Meredith Staggers, MD, FAAPMR     See Team Conference Notes for weekly updates to the plan of care

## 2020-11-21 NOTE — Progress Notes (Signed)
PROGRESS NOTE   Subjective/Complaints:  Says she had a good night. No complaints  ROS: Patient denies fever, rash, sore throat, blurred vision, nausea, vomiting, diarrhea, cough, shortness of breath or chest pain, joint or back pain, headache, or mood change.    Objective:   No results found. Recent Labs    11/18/20 1433 11/21/20 0603  WBC 8.8 8.8  HGB 11.3* 10.8*  HCT 35.9* 33.1*  PLT 319 278   Recent Labs    11/18/20 1433 11/21/20 0603  NA  --  139  K  --  3.7  CL  --  105  CO2  --  27  GLUCOSE  --  87  BUN  --  21  CREATININE 0.94 0.79  CALCIUM  --  8.7*    Intake/Output Summary (Last 24 hours) at 11/21/2020 1047 Last data filed at 11/21/2020 0826 Gross per 24 hour  Intake 578 ml  Output --  Net 578 ml        Physical Exam: Vital Signs Blood pressure (!) 156/64, pulse 69, temperature 98.9 F (37.2 C), resp. rate 14, height 5\' 7"  (1.702 m), weight 59.4 kg, SpO2 97 %.  Constitutional: No distress . Vital signs reviewed. HEENT: NCAT, EOMI, oral membranes moist Neck: supple Cardiovascular: RRR without murmur. No JVD    Respiratory/Chest: CTA Bilaterally without wheezes or rales. Normal effort    GI/Abdomen: BS +, non-tender, non-distended Ext: no clubbing, cyanosis, or edema Psych: pleasant and cooperative  Skin: healing wound right temple Neurologic: Cranial nerves II through XII intact, motor strength is 5/5 in bilateral deltoid, bicep, tricep, grip, hip flexor, knee extensors, ankle dorsiflexor and plantar flexor Normal sensory and cerebellar exam.  Musculoskeletal: Full ROM, No pain with AROM or PROM in the neck, trunk, or extremities. Posture appropriate     Assessment/Plan: 1. Functional deficits which require 3+ hours per day of interdisciplinary therapy in a comprehensive inpatient rehab setting. Physiatrist is providing close team supervision and 24 hour management of active medical  problems listed below. Physiatrist and rehab team continue to assess barriers to discharge/monitor patient progress toward functional and medical goals  Care Tool:  Bathing    Body parts bathed by patient: Right arm, Left arm, Chest, Abdomen, Front perineal area, Buttocks, Right upper leg, Left upper leg, Face   Body parts bathed by helper: Left lower leg, Right lower leg     Bathing assist Assist Level: Minimal Assistance - Patient > 75%     Upper Body Dressing/Undressing Upper body dressing   What is the patient wearing?: Pull over shirt    Upper body assist Assist Level: Set up assist    Lower Body Dressing/Undressing Lower body dressing      What is the patient wearing?: Incontinence brief, Pants     Lower body assist Assist for lower body dressing: Contact Guard/Touching assist     Toileting Toileting    Toileting assist Assist for toileting: Minimal Assistance - Patient > 75%     Transfers Chair/bed transfer  Transfers assist     Chair/bed transfer assist level: Minimal Assistance - Patient > 75%     Locomotion Ambulation   Ambulation assist  Assist level: Minimal Assistance - Patient > 75% Assistive device: Hand held assist Max distance: 10   Walk 10 feet activity   Assist     Assist level: Minimal Assistance - Patient > 75% Assistive device: Hand held assist   Walk 50 feet activity   Assist Walk 50 feet with 2 turns activity did not occur: Safety/medical concerns         Walk 150 feet activity   Assist Walk 150 feet activity did not occur: Safety/medical concerns         Walk 10 feet on uneven surface  activity   Assist Walk 10 feet on uneven surfaces activity did not occur: Safety/medical concerns         Wheelchair     Assist Is the patient using a wheelchair?: Yes (per PT note) Type of Wheelchair: Manual    Wheelchair assist level: Minimal Assistance - Patient > 75% Max wheelchair distance: 150     Wheelchair 50 feet with 2 turns activity    Assist        Assist Level: Minimal Assistance - Patient > 75%   Wheelchair 150 feet activity     Assist      Assist Level: Minimal Assistance - Patient > 75%   Blood pressure (!) 156/64, pulse 69, temperature 98.9 F (37.2 C), resp. rate 14, height 5\' 7"  (1.702 m), weight 59.4 kg, SpO2 97 %.  Medical Problem List and Plan: 1.  Deficits with mobility, transfers, vision secondary to right temporal arteritis secondary to severe right vertebral artery stenosis. PLAN PREDNISONE TAPER BY 10 MG EVERY 2-WEEK AND FOLLOW UP NEUROLOGY/RHEUMATOLOGY Right cerebellar infarcts             -patient may shower             -ELOS/Goals: 13-16 days/supervision/min a            -Continue CIR therapies including PT, OT  2.  Antithrombotics: -DVT/anticoagulation:  Pharmaceutical: Lovenox             -antiplatelet therapy: Aspirin 81 mg daily and Plavix 75 mg day x3 weeks then aspirin alone 3. Pain Management: Hydrocodone as needed 4. Mood: Provide emotional support             -antipsychotic agents: N/A 5. Neuropsych: This patient is capable of making decisions on her own behalf. 6. Skin/Wound Care: Routine skin checks 7. Fluids/Electrolytes/Nutrition: Routine in and outs             I personally reviewed the patient's labs today.   8.  Hypertension.  Norvasc 5 mg daily, Cozaar 25 mg daily.   Vitals:   11/21/20 0355 11/21/20 0912  BP: (!) 157/67 (!) 156/64  Pulse: 69   Resp: 14   Temp: 98.9 F (37.2 C)   SpO2: 97%   10/3 fair control today  9.  Hyperlipidemia for Lipitor 10.  Diabetes mellitus.  Hemoglobin A1c 6.3.  Semglee 8 units twice daily             Monitor with increased mobility CBG (last 3)  Recent Labs    11/20/20 1658 11/20/20 2058 11/21/20 0608  GLUCAP 304* 231* 86  Added hs snack with some improvement  10/3 -sugars still high in PM. Coverage also leading to AM hypoglycemia  -increase am semglee to 12u , continue  8u q pm 11.  Right cerebellar infarct    LOS: 3 days A FACE TO FACE EVALUATION WAS PERFORMED  Meredith Staggers  11/21/2020, 10:47 AM

## 2020-11-21 NOTE — Progress Notes (Signed)
Patient ID: Jenna Long, female   DOB: March 31, 1934, 86 y.o.   MRN: [id number] Met with the patient to introduce self and role of the nurse CM. Reviewed secondary risk factors, rehab schedule and plan of care including DAPT x 3 weeks then ASA solo. Discussed dietary modifications recommended and medications. Patient reported family will help at discharge and have hired caregivers to supplement family. Patient given handouts on information reviewed including arteritis and protein food sources. Continue to follow along to discharge to address educational needs and collaborate with the team to facilitate preparation for discharge. Margarito Liner

## 2020-11-21 NOTE — Progress Notes (Signed)
Inpatient Rehabilitation Center Individual Statement of Services  Patient Name:  Jenna Long  Date:  11/21/2020  Welcome to the [place].  Our goal is to provide you with an individualized program based on your diagnosis and situation, designed to meet your specific needs.  With this comprehensive rehabilitation program, you will be expected to participate in at least 3 hours of rehabilitation therapies Monday-Friday, with modified therapy programming on the weekends.  Your rehabilitation program will include the following services:  Physical Therapy (PT), Occupational Therapy (OT), Speech Therapy (ST), 24 hour per day rehabilitation nursing, Therapeutic Recreaction (TR), Neuropsychology, Care Coordinator, Rehabilitation Medicine, Nutrition Services, Pharmacy Services, and Other  Weekly team conferences will be held on Wednesdays to discuss your progress.  Your Inpatient Rehabilitation Care Coordinator will talk with you frequently to get your input and to update you on team discussions.  Team conferences with you and your family in attendance may also be held.  Expected length of stay:  5-7 Days  Overall anticipated outcome:  Supervision  Depending on your progress and recovery, your program may change. Your Inpatient Rehabilitation Care Coordinator will coordinate services and will keep you informed of any changes. Your Inpatient Rehabilitation Care Coordinator's name and contact numbers are listed  below.  The following services may also be recommended but are not provided by the [facility]:   [place] will be made to provide these services after discharge if needed.  Arrangements include referral to agencies that provide these services.  Your insurance has been verified to be:   [employer]  Your primary doctor is:  Sharilyn Sites, MD  Pertinent  information will be shared with your doctor and your insurance company.  Inpatient Rehabilitation Care Coordinator:  Erlene Quan, [place] or [phone]  Information discussed with and copy given to patient by: Dyanne Iha, 11/21/2020, 10:25 AM

## 2020-11-22 LAB — GLUCOSE, CAPILLARY
Glucose-Capillary: 99 mg/dL (ref 70–99)
Glucose-Capillary: 279 mg/dL — ABNORMAL HIGH (ref 70–99)
Glucose-Capillary: 116 mg/dL — ABNORMAL HIGH (ref 70–99)
Glucose-Capillary: 219 mg/dL — ABNORMAL HIGH (ref 70–99)

## 2020-11-22 MED ORDER — PREDNISONE 20 MG PO TABS
50.0000 mg | ORAL_TABLET | Freq: Every day | ORAL | Status: DC
  Administered 2020-11-22 – 2020-11-26 (×5): 50 mg via ORAL
  Filled 2020-11-22 (×5): qty 2

## 2020-11-22 MED ORDER — AMLODIPINE BESYLATE 10 MG PO TABS
10.0000 mg | ORAL_TABLET | Freq: Every day | ORAL | Status: DC
  Administered 2020-11-22 – 2020-11-26 (×5): 10 mg via ORAL
  Filled 2020-11-22 (×5): qty 1

## 2020-11-22 NOTE — Progress Notes (Signed)
PROGRESS NOTE   Subjective/Complaints:  No issues overnite , no pains, no bowel or bladder problems    ROS: Patient denies CP, SOB, N/V/D  Objective:   No results found. Recent Labs    11/21/20 0603  WBC 8.8  HGB 10.8*  HCT 33.1*  PLT 278    Recent Labs    11/21/20 0603  NA 139  K 3.7  CL 105  CO2 27  GLUCOSE 87  BUN 21  CREATININE 0.79  CALCIUM 8.7*     Intake/Output Summary (Last 24 hours) at 11/22/2020 0745 Last data filed at 11/22/2020 0739 Gross per 24 hour  Intake 938 ml  Output --  Net 938 ml         Physical Exam: Vital Signs Blood pressure (!) 171/78, pulse (!) 58, temperature 97.7 F (36.5 C), resp. rate 18, height 5\' 7"  (1.702 m), weight 59.4 kg, SpO2 98 %.   General: No acute distress Mood and affect are appropriate Heart: Regular rate and rhythm no rubs murmurs or extra sounds Lungs: Clear to auscultation, breathing unlabored, no rales or wheezes Abdomen: Positive bowel sounds, soft nontender to palpation, nondistended Extremities: No clubbing, cyanosis, or edema Skin: healing wound right temple Neurologic: Cranial nerves II through XII intact, motor strength is 5/5 in bilateral deltoid, bicep, tricep, grip, hip flexor, knee extensors, ankle dorsiflexor and plantar flexor Normal sensory and cerebellar exam.  Musculoskeletal: Full ROM, No pain with AROM or PROM in the neck, trunk, or extremities. Posture appropriate     Assessment/Plan: 1. Functional deficits which require 3+ hours per day of interdisciplinary therapy in a comprehensive inpatient rehab setting. Physiatrist is providing close team supervision and 24 hour management of active medical problems listed below. Physiatrist and rehab team continue to assess barriers to discharge/monitor patient progress toward functional and medical goals  Care Tool:  Bathing    Body parts bathed by patient: Right arm, Left arm,  Chest, Abdomen, Front perineal area, Buttocks, Right upper leg, Left upper leg, Face   Body parts bathed by helper: Left lower leg, Right lower leg     Bathing assist Assist Level: Minimal Assistance - Patient > 75%     Upper Body Dressing/Undressing Upper body dressing   What is the patient wearing?: Pull over shirt    Upper body assist Assist Level: Set up assist    Lower Body Dressing/Undressing Lower body dressing      What is the patient wearing?: Incontinence brief, Pants     Lower body assist Assist for lower body dressing: Contact Guard/Touching assist     Toileting Toileting    Toileting assist Assist for toileting: Minimal Assistance - Patient > 75%     Transfers Chair/bed transfer  Transfers assist     Chair/bed transfer assist level: Minimal Assistance - Patient > 75%     Locomotion Ambulation   Ambulation assist      Assist level: Minimal Assistance - Patient > 75% Assistive device: Hand held assist Max distance: 10   Walk 10 feet activity   Assist     Assist level: Minimal Assistance - Patient > 75% Assistive device: Hand held assist   Walk 50  feet activity   Assist Walk 50 feet with 2 turns activity did not occur: Safety/medical concerns         Walk 150 feet activity   Assist Walk 150 feet activity did not occur: Safety/medical concerns         Walk 10 feet on uneven surface  activity   Assist Walk 10 feet on uneven surfaces activity did not occur: Safety/medical concerns         Wheelchair     Assist Is the patient using a wheelchair?: Yes (per PT note) Type of Wheelchair: Manual    Wheelchair assist level: Minimal Assistance - Patient > 75% Max wheelchair distance: 150    Wheelchair 50 feet with 2 turns activity    Assist        Assist Level: Minimal Assistance - Patient > 75%   Wheelchair 150 feet activity     Assist      Assist Level: Minimal Assistance - Patient > 75%    Blood pressure (!) 171/78, pulse (!) 58, temperature 97.7 F (36.5 C), resp. rate 18, height 5\' 7"  (1.702 m), weight 59.4 kg, SpO2 98 %.  Medical Problem List and Plan: 1.  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 PLAN PREDNISONE TAPER BY 10 MG EVERY 2-WEEK AND FOLLOW UP NEUROLOGY/RHEUMATOLOGY Right cerebellar infarcts             -patient may shower             -ELOS/Goals: 13-16 days/supervision/min a            -Continue CIR therapies including PT, OT , team conf in am  2.  Antithrombotics: -DVT/anticoagulation:  Pharmaceutical: Lovenox             -antiplatelet therapy: Aspirin 81 mg daily and Plavix 75 mg day x3 weeks then aspirin alone 3. Pain Management: Hydrocodone as needed 4. Mood: Provide emotional support             -antipsychotic agents: N/A 5. Neuropsych: This patient is capable of making decisions on her own behalf. 6. Skin/Wound Care: Routine skin checks 7. Fluids/Electrolytes/Nutrition: Routine in and outs             I personally reviewed the patient's labs today.   8.  Hypertension.  Norvasc 5 mg daily, Cozaar 25 mg daily.   Vitals:   11/21/20 1926 11/22/20 0545  BP: (!) 153/59 (!) 171/78  Pulse: (!) 57 (!) 58  Resp: 18 18  Temp: 97.8 F (36.6 C) 97.7 F (36.5 C)  SpO2: 99% 98%  10/4 remains elevated increase amlodipine to 10mg    9.  Hyperlipidemia for Lipitor 10.  Diabetes mellitus.  Hemoglobin A1c 6.3.  Semglee 8 units twice daily             Monitor with increased mobility CBG (last 3)  Recent Labs    11/21/20 1643 11/21/20 2059 11/22/20 0551  GLUCAP 305* 183* 99   Added hs snack with some improvement  10/3 -sugars still high in PM. Coverage also leading to AM hypoglycemia  -increase am semglee to 12u , continue 8u q pm Monitor CBG with changes     LOS: 4 days A FACE TO FACE EVALUATION WAS PERFORMED  Charlett Blake 11/22/2020, 7:45 AM

## 2020-11-22 NOTE — Progress Notes (Signed)
Physical Therapy Session Note  Patient Details  Name: Jenna Long MRN: [id number] Date of Birth: 1934/07/20  Today's Date: 11/22/2020 PT Individual Time: 9628-3662 and 1502-1600 PT Individual Time Calculation (min): 73 min and 58 min   Short Term Goals: Week 1:  PT Short Term Goal 1 (Week 1): STG=LTG due to ELOS  Skilled Therapeutic Interventions/Progress Updates:  Session 1: Patient seated upright in recliner on entrance to room. Patient alert and agreeable to PT session.   Patient with no pain complaint throughout session.  Therapeutic Activity: Transfers: Patient performed sit<>stand and stand pivot transfers throughout session with CGA. Provided verbal cues for technique with focus on forward lean. Improvement noted after NMR.  Gait Training:  Patient ambulated >300 ft x2/ 135' x1 using RW for longer bouts and hurry cane for shorter bouts between seats set up 60' apart in day room. Ambulates with RW with CGA/ supervision and with CGA with hurrycane. Demonstrated increased instability in balance over RLE with use of cane vs RW. Instances of R inattention requiring vc and need for scanning to R in order to clear corners and some obstacles.   Neuromuscular Re-ed: NMR facilitated during session with focus on dynamic standing balance, dynamic stepping. Pt guided in forward lunges with BUE support on therapist's arms. Hesitant at first and improving in step distance and pressure into UE support throughout. Next guided in blocked practice of sit<>stand with focus on forward lean in order to perform. Improves from Min A to supervision throughout. Pushes from seat with no armrests available. Improved lift of reat end from seat.  Guided in stepping using hurrycane over low height obstacles including yardstick and cones laid on sides. Pt requires vc throughout for sequencing of safe stepping and balance attainment in order to perform. Completes stepping over obstacles with 50% clearance  throughout. 100% cues and Min A for balance.   Pt guided in continuous reciprocation of BUE and BLE using NuStep L3 x 48min with focus on maintaining speed and foot placement with equal push into extension. Started with BUE and BLE and progressing to BLE only in final 2 min. Maintains 60-73 steps/ min, reaches 305 steps, while ranging within 2.0-2.4 METs throughout. NMR performed for improvements in motor control and coordination, balance, sequencing, judgement, and self confidence/ efficacy in performing all aspects of mobility at highest level of independence.   Patient supine  in bed at end of session with brakes locked, bed alarm set, and all needs within reach.    Session 2: Patient supine in bed on entrance to room. Patient alert and agreeable to PT session.   Patient with no pain complaint throughout session.  Therapeutic Activity: Bed Mobility: Patient performed supine --> sit with supervision with vc/ tc required for technique and hand placement to bedrail. Transfers: Patient performed sit<>stand and stand pivot transfers throughout session with supervision. Continuing learned technique from previous session. Provided verbal cues for technique when needed.  Gait Training:  Patient ambulated >175' x2 using RW with CGA/ supervision. Provided vc/ tc for increased R sided awareness.   Neuromuscular Re-ed: NMR facilitated during session with focus on standing balance. Pt guided in use of Biodex for balance training and pt with difficulty managing balance on pressure plate as she moves feet in order to attempt to hit targets. Requires Min/ ModA to correctly shift balance statically as she continues to move balance and COM back and forth.  Pt brought to BITS for cognitive practice and attending to cues in sequencing alphabet  in scanning full visual field. Pt is > 80 % accurate with mishits d/t touches with other fingers, 4sec reaction time and 1:42 total time for alphabet, then 70% accurate  with 5.41 sec reaction time and completing in 2:21 with addition of attending to additional letter in center of screen. Requires cues to attend to center of screen. Good balance with pt using UUE support to walker with intermittent no UE support and no LOB.  NMR performed for improvements in motor control and coordination, balance, sequencing, judgement, and self confidence/ efficacy in performing all aspects of mobility at highest level of independence.   Patient seated upright  in reliner at end of session with brakes locked, belt alarm set, and all needs within reach. Tray table set to pt's side with all needs in reach.   Therapy Documentation Precautions:  Precautions Precautions: Fall, Other (comment) Precaution Comments: h/o orthostatic hypotension and syncope during admission Restrictions Weight Bearing Restrictions: No General:   Vital Signs: Therapy Vitals Temp: 97.7 F (36.5 C) Pulse Rate: (!) 58 Resp: 18 BP: (!) 171/78 Patient Position (if appropriate): Sitting Oxygen Therapy SpO2: 98 % O2 Device: Room Air Pain:   No complaints of pain throughout sessions.   Therapy/Group: Individual Therapy  Alger Simons PT, DPT 11/22/2020, 9:15 AM

## 2020-11-22 NOTE — Progress Notes (Signed)
Occupational Therapy Session Note  Patient Details  Name: Jenna Long MRN: [id number] Date of Birth: [birth year]  Today's Date: 11/22/2020 OT Individual Time: 1345-1420 OT Individual Time Calculation (min): 35 min  and Today's Date: 11/22/2020 OT Missed Time: 10 Minutes Missed Time Reason: Patient fatigue   Short Term Goals: Week 1:  OT Short Term Goal 1 (Week 1): STG = LTG d/t ELOS  Skilled Therapeutic Interventions/Progress Updates:    Pt greeted seated EOB with nurse tech and agreeable to OT treatment session. PT ambulated to the gym with RW and CGA. Worked on standing balance/endurance standing on foam while reaching behind pt to match cards. Pt needed CGA/min A for balance when reaching behind. UB there-ex using 2 lb dowel rod 2 sets of 10 chest press, bicep curl, and straight arm raises. Extended rest breaks in between. Pt reported max fatigue and requested to return to the room. Pt ambulated back to the room with CGA, RW and left semi-reclined in bed with needs met.   Therapy Documentation Precautions:  Precautions Precautions: Fall, Other (comment) Precaution Comments: h/o orthostatic hypotension and syncope during admission Restrictions Weight Bearing Restrictions: No General: General OT Amount of Missed Time: 10 Minutes    Pain:  Pt reports discomfort in neck, but would not rate pain. Pain went away when she laid down.    Therapy/Group: Individual Therapy  Valma Cava 11/22/2020, 2:28 PM

## 2020-11-22 NOTE — Progress Notes (Signed)
Occupational Therapy Session Note  Patient Details  Name: Jenna Long MRN: [id number] Date of Birth: 04/21/1934  Today's Date: 11/22/2020 OT Individual Time: 0701-0745 OT Individual Time Calculation (min): 44 min    Short Term Goals: Week 1:  OT Short Term Goal 1 (Week 1): STG = LTG d/t ELOS  Patient met seated in recliner in agreement with OT treatment session. 0/10 pain reported at rest and with activity. Patient unable to recall working with this therapist or details of treatment session yesterday. Functional mobility to BSC over standard height toilet in bathroom. Completed 3/3 parts of toileting task with steadying assist for clothing management in standing. Patient in agreement with bathing at shower level this date. Once seated on tub bench, patient put soap on washcloth and rubbed on face before rinsing body after which time patient stated she was finished bathing. This OT brought attention to need for bathing arms, chest, abdomen, legs, front perineal area and buttocks but patient again stated that she was finished bathing. Seated on BSC, patient donned UB clothing with set-up assist and LB clothing with use of reacher with steadying assist in standing. Min A to don L sock. At sink level, patient completed oral hygiene with steadying assist. Letter cancellation task seated in recliner to further assess vision, coordination and cognition. Patient able to read instructions with reading glasses on, adequate lighting and paper closer to face. Patient then completed task noting ability to find all letter B's but some difficulty placing X right over the top of each letter B. Education provided on vision strategies in prep for IADLs requiring reading upon d/c home. Session concluded with patient seated in recliner with call bell within reach, belt alarm activated and all needs met.   Therapy Documentation Precautions:  Precautions Precautions: Fall, Other (comment) Precaution Comments: h/o  orthostatic hypotension and syncope during admission Restrictions Weight Bearing Restrictions: No General:    Therapy/Group: Individual Therapy   R Howerton-Davis 11/22/2020, 6:45 AM 

## 2020-11-23 LAB — GLUCOSE, CAPILLARY
Glucose-Capillary: 70 mg/dL (ref 70–99)
Glucose-Capillary: 94 mg/dL (ref 70–99)
Glucose-Capillary: 144 mg/dL — ABNORMAL HIGH (ref 70–99)
Glucose-Capillary: 254 mg/dL — ABNORMAL HIGH (ref 70–99)
Glucose-Capillary: 178 mg/dL — ABNORMAL HIGH (ref 70–99)

## 2020-11-23 NOTE — Progress Notes (Signed)
Patient ID: Jenna Long, female   DOB: [birth year], 86 y.o.   MRN: [id number]  Sw made attempt to provide team conference update. Unable to leave VM, sw will continue to follow up.  [place], [place]

## 2020-11-23 NOTE — Progress Notes (Signed)
Recreational Therapy Session Note  Patient Details  Name: SHIVANGI LUTZ MRN: [id number] Date of Birth: 1934-02-26 Today's Date: 11/23/2020  Pain: no c/o  Pt participated in animal assisted activity seated EOB with supervision, petting the therapy dog and conversing with the pet partner team.  Pt appreciative of this visit.   Teela Narducci 11/23/2020, 12:03 PM

## 2020-11-23 NOTE — Progress Notes (Signed)
Physical Therapy Session Note  Patient Details  Name: Jenna Long MRN: [id number] Date of Birth: 04/30/1934  Today's Date: 11/23/2020 PT Individual Time: 1003-1030 PT Individual Time Calculation (min): 27 min   Short Term Goals: Week 1:  PT Short Term Goal 1 (Week 1): STG=LTG due to ELOS  Skilled Therapeutic Interventions/Progress Updates:     Pt received supine in bed and agrees to therapy. No complaint of pain. Supine to sit with supervision and cues for positioning. Pt performs stand pivot transfer to [facility] with minA and cues for positioning and sequencing. WC transport to gym. Stand pivot to mat table with minA. Pt performs aspects of BERG to work on standing balance and NMR for R lower extremity. Multiple reps of sit to stand with light minA at sternum and sacrum for proper body mechanics. Pt able to stand x2 minutes with close supervision and cues to maintain upright gaze to improve posture and balance. Pt then attempts standing with feet in narrow base of support. Able to balance ~15 seconds but begins to lose balance, requiring minA for safety. Following seated rest break, pt stands with narrow base of support for 1 minute without phsycial assistance. Pt then alternates standing with staggered stance with L foot forward and then with R foot forward. Pt loses balance with R foot forward, requiring minA for safety and then requiring seated rest break. Pt attempts additional bout of semi tandem stance and is able to complete x30 seconds with close supervision. Pt then cued to transfer back to [facility] without use of hands, and is able to do so with cues for sequencing. Pt left seated in recliner with alarm intact and all needs within reach.  Therapy Documentation Precautions:  Precautions Precautions: Fall, Other (comment) Precaution Comments: h/o orthostatic hypotension and syncope during admission Restrictions Weight Bearing Restrictions: No    Therapy/Group: Individual Therapy  Breck Coons, PT, DPT 11/23/2020, 4:04 PM

## 2020-11-23 NOTE — Plan of Care (Signed)
  Problem: Consults Goal: RH STROKE PATIENT EDUCATION Description: See Patient Education module for education specifics  Outcome: Progressing   Problem: RH BOWEL ELIMINATION Goal: RH STG MANAGE BOWEL WITH ASSISTANCE Description: STG Manage Bowel with mod I Assistance. Outcome: Progressing Goal: RH STG MANAGE BOWEL W/MEDICATION W/ASSISTANCE Description: STG Manage Bowel with Medication with mod I Assistance. Outcome: Progressing   Problem: RH BLADDER ELIMINATION Goal: RH STG MANAGE BLADDER WITH ASSISTANCE Description: STG Manage Bladder With min Assistance Outcome: Progressing   Problem: RH SAFETY Goal: RH STG ADHERE TO SAFETY PRECAUTIONS W/ASSISTANCE/DEVICE Description: STG Adhere to Safety Precautions With cues Assistance/Device. Outcome: Progressing   Problem: RH KNOWLEDGE DEFICIT Goal: RH STG INCREASE KNOWLEDGE OF DIABETES Description: Patient will be able to manage DM with medications and dietary modifications using handouts and educational resources independently Outcome: Progressing Goal: RH STG INCREASE KNOWLEDGE OF HYPERTENSION Description: Patient will be able to manage HTN with medications and dietary modifications using handouts and educational resources independently Outcome: Progressing Goal: RH STG INCREASE KNOWLEGDE OF HYPERLIPIDEMIA Description: Patient will be able to manage HLD with medications and dietary modifications using handouts and educational resources independently Outcome: Progressing Goal: RH STG INCREASE KNOWLEDGE OF STROKE PROPHYLAXIS Description: Patient will be able to manage secondary risks with medications and dietary modifications using handouts and educational resources independently Outcome: Progressing

## 2020-11-23 NOTE — Progress Notes (Signed)
PROGRESS NOTE   Subjective/Complaints: No issues overnite  Denies bowel or bladder isssues seen in PT, req CGA/Sup for amb with walker   ROS: Patient denies CP, SOB, N/V/D  Objective:   No results found. Recent Labs    11/21/20 0603  WBC 8.8  HGB 10.8*  HCT 33.1*  PLT 278    Recent Labs    11/21/20 0603  NA 139  K 3.7  CL 105  CO2 27  GLUCOSE 87  BUN 21  CREATININE 0.79  CALCIUM 8.7*     Intake/Output Summary (Last 24 hours) at 11/23/2020 6967 Last data filed at 11/22/2020 2047 Gross per 24 hour  Intake 712 ml  Output --  Net 712 ml         Physical Exam: Vital Signs Blood pressure (!) 165/65, pulse (!) 58, temperature 98.7 F (37.1 C), temperature source Oral, resp. rate 18, height 5' 7" (1.702 m), weight 59.4 kg, SpO2 98 %.   General: No acute distress Mood and affect are appropriate Heart: Regular rate and rhythm no rubs murmurs or extra sounds Lungs: Clear to auscultation, breathing unlabored, no rales or wheezes Abdomen: Positive bowel sounds, soft nontender to palpation, nondistended Extremities: No clubbing, cyanosis, or edema Skin: No evidence of breakdown, no evidence of rash  Neurologic: Cranial nerves II through XII intact, motor strength is 5/5 in bilateral deltoid, bicep, tricep, grip, hip flexor, knee extensors, ankle dorsiflexor and plantar flexor Normal sensory and cerebellar exam.  Musculoskeletal: Full ROM, No pain with AROM or PROM in the neck, trunk, or extremities. Posture appropriate     Assessment/Plan: 1. Functional deficits which require 3+ hours per day of interdisciplinary therapy in a comprehensive inpatient rehab setting. Physiatrist is providing close team supervision and 24 hour management of active medical problems listed below. Physiatrist and rehab team continue to assess barriers to discharge/monitor patient progress toward functional and medical  goals  Care Tool:  Bathing    Body parts bathed by patient: Right arm, Left arm, Chest, Abdomen, Front perineal area, Buttocks, Right upper leg, Left upper leg, Face   Body parts bathed by helper: Left lower leg, Right lower leg     Bathing assist Assist Level: Minimal Assistance - Patient > 75%     Upper Body Dressing/Undressing Upper body dressing   What is the patient wearing?: Pull over shirt    Upper body assist Assist Level: Set up assist    Lower Body Dressing/Undressing Lower body dressing      What is the patient wearing?: Incontinence brief, Pants     Lower body assist Assist for lower body dressing: Contact Guard/Touching assist     Toileting Toileting    Toileting assist Assist for toileting: Minimal Assistance - Patient > 75%     Transfers Chair/bed transfer  Transfers assist     Chair/bed transfer assist level: Minimal Assistance - Patient > 75%     Locomotion Ambulation   Ambulation assist      Assist level: Minimal Assistance - Patient > 75% Assistive device: Hand held assist Max distance: 10   Walk 10 feet activity   Assist     Assist level: Minimal Assistance -  Patient > 75% Assistive device: Hand held assist   Walk 50 feet activity   Assist Walk 50 feet with 2 turns activity did not occur: Safety/medical concerns         Walk 150 feet activity   Assist Walk 150 feet activity did not occur: Safety/medical concerns         Walk 10 feet on uneven surface  activity   Assist Walk 10 feet on uneven surfaces activity did not occur: Safety/medical concerns         Wheelchair     Assist Is the patient using a wheelchair?: Yes (per PT note) Type of Wheelchair: Manual    Wheelchair assist level: Minimal Assistance - Patient > 75% Max wheelchair distance: 150    Wheelchair 50 feet with 2 turns activity    Assist        Assist Level: Minimal Assistance - Patient > 75%   Wheelchair 150 feet  activity     Assist      Assist Level: Minimal Assistance - Patient > 75%   Blood pressure (!) 165/65, pulse (!) 58, temperature 98.7 F (37.1 C), temperature source Oral, resp. rate 18, height 5' 7" (1.702 m), weight 59.4 kg, SpO2 98 %.  Medical Problem List and Plan: 1.  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 PLAN PREDNISONE TAPER BY 10 MG EVERY 2-WEEK AND FOLLOW UP NEUROLOGY/RHEUMATOLOGY Right cerebellar infarcts             -patient may shower             -ELOS/Goals: 13-16 days/supervision/min a            -Continue CIR therapies including PT, OT , Team conference today please see physician documentation under team conference tab, met with team  to discuss problems,progress, and goals. Formulized individual treatment plan based on medical history, underlying problem and comorbidities.  2.  Antithrombotics: -DVT/anticoagulation:  Pharmaceutical: Lovenox             -antiplatelet therapy: Aspirin 81 mg daily and Plavix 75 mg day x3 weeks then aspirin alone 3. Pain Management: Hydrocodone as needed 4. Mood: Provide emotional support             -antipsychotic agents: N/A 5. Neuropsych: This patient is capable of making decisions on her own behalf. 6. Skin/Wound Care: Routine skin checks 7. Fluids/Electrolytes/Nutrition: Routine in and outs             I personally reviewed the patient's labs today.   8.  Hypertension.  Norvasc 5 mg daily, Cozaar 25 mg daily.   Vitals:   11/22/20 1959 11/23/20 0444  BP: 127/64 (!) 165/65  Pulse: 65 (!) 58  Resp: 16 18  Temp: 97.8 F (36.6 C) 98.7 F (37.1 C)  SpO2: 100% 98%  10/4 remains elevated increase amlodipine to 26m  monitor on higher dose   9.  Hyperlipidemia for Lipitor 10.  Diabetes mellitus.  Hemoglobin A1c 6.3.  Semglee 8 units twice daily             Monitor with increased mobility CBG (last 3)   Recent Labs    11/22/20 2043 11/23/20 0633 11/23/20 0655  GLUCAP 219* 70 94   Added hs snack with some improvement  10/3 -sugars still high in PM. Coverage also leading to AM hypoglycemia  -increase am semglee to 12u , continue 8u q pm- prednisone dose  reduced will DC pm semglee Monitor CBG with changes     LOS: 5 days A FACE TO FACE EVALUATION WAS PERFORMED  Charlett Blake 11/23/2020, 8:07 AM

## 2020-11-23 NOTE — Progress Notes (Signed)
Physical Therapy Session Note  Patient Details  Name: Jenna Long MRN: [id number] Date of Birth: 01-18-1935  Today's Date: 11/23/2020 PT Individual Time: 0800-0855 PT Individual Time Calculation (min): 55 min   Short Term Goals: Week 1:  PT Short Term Goal 1 (Week 1): STG=LTG due to ELOS  Skilled Therapeutic Interventions/Progress Updates:    Pt received in recliner and agreeable to therapy.  No complaint of pain. Pt requested to use bathroom. ambulatory transfer to bathroom with continent urine void, supervision for for 3/3 toileting tasks. Pt ambulated to therapy gym with RW and CGA, no LOB noted. MD in/out for rounds and nsg in/out for meds pass. Pt directed in walking  with hurry cane around gym and retrieving 15 cones from around room for visual scanning and dynamic balance training. Pt repeated this exercise x 3, adding cones on the floor, and stepping on/over various obstacles for increased challenge. Pt then stood on airex pad with RW and moved cones from one side of body to the other, with cross body reaching and occ no UE support, CGA. Pt then stood on airex pad with RW and retrieved cones from ground, handing to therapist over head. Gait x 250 ft with hurrycane and CGA, increased cognitive challenge of discussing grandchildren and holiday traditions while continuing to walk. Pt returned to recliner after session and was left with all needs in reach and alarm active.   Therapy Documentation Precautions:  Precautions Precautions: Fall, Other (comment) Precaution Comments: h/o orthostatic hypotension and syncope during admission Restrictions Weight Bearing Restrictions: No    Therapy/Group: Individual Therapy  Mickel Fuchs 11/23/2020, 8:59 AM

## 2020-11-23 NOTE — Progress Notes (Signed)
Occupational Therapy Session Note  Patient Details  Name: Jenna Long MRN: [id number] Date of Birth: 11-18-1934  Today's Date: 11/23/2020 OT Individual Time: 9861-4830 OT Individual Time Calculation (min): 54 min    Short Term Goals: Week 1:  OT Short Term Goal 1 (Week 1): STG = LTG d/t ELOS  Skilled Therapeutic Interventions/Progress Updates:    Pt received awake in bed, no c/o pain, agreeable to therapy. Session focus on self-care retraining, activity tolerance, R visual scanning, dynamic standing balance, functional cognition in prep for improved ADL/IADL/func mobility performance + decreased caregiver burden.  Came to sitting EOB with distant S. Sit to stand with RW and increased time to power up from low surface. Amb to sink with CGA and RW, no overt LOB. Bathed full-body at sink at sit to stand level with CGA for standing, requires increased time to come into standing and use of BUE to push up from w/c. Reports having been incontinent of bladder and would like to cont to work on her "balance" prior to DC. Donned shirt /completing grooming tasks and oral care in standing with S for balance. Only required assist to locate deodorant on L side of sink. Completed LBD with overall min A to pick up pants after they had fallen down, ind in use of reacher to facilitate hemi-technique. Req min A to adjust L shoe over L heel. Reports she usually uses a shoe horn.   Total A w/c transport to gym with total A 2/2 time management and energy conservation. Short ambulatory transfer to mat with CGA and RW. Completed 5 rounds of corn hole to target dynamic standing balance and activity tolerance and R sided visual scanning. Required CGA throughout for balance, no overt LOB and able to self-correct mild unsteadiness. Utilized BUE to toss bean bags, required minimally increased time to locate and retrieve bags when presented on R side at various heights. Mod assist to keep track of total points.  Ambulatory  transfer to room with RW + CGA to target dynamic standing balance , activity tolerance, and R visual scanning, min Vcs to locate room.  Reviewed required DME (TTB, already has 3in1) and pt reports her granddaughter will be with her 24/7 upon DC and can drive her to outpatient therapy if needed.  Pt left seated in recliner with safety belt alarm engaged, call bell in reach, and all immediate needs met.    Therapy Documentation Precautions:  Precautions Precautions: Fall, Other (comment) Precaution Comments: h/o orthostatic hypotension and syncope during admission Restrictions Weight Bearing Restrictions: No  Pain:  denies ADL: See Care Tool for more details. Therapy/Group: Individual Therapy  Volanda Napoleon MS, OTR/L  11/23/2020, 6:37 AM

## 2020-11-23 NOTE — Progress Notes (Signed)
Physical Therapy Session Note  Patient Details  Name: Jenna Long MRN: [id number] Date of Birth: 1935/01/26  Today's Date: 11/23/2020 PT Individual Time: 1430-1527 PT Individual Time Calculation (min): 57 min   Short Term Goals: Week 1:  PT Short Term Goal 1 (Week 1): STG=LTG due to ELOS  Skilled Therapeutic Interventions/Progress Updates:    Patient received reclined in bed, agreeable to PT. RN present for vitals. She denies pain. She was able to come sit edge of bed with supervision and transfer to wc via stand pivot with CGA. Patient with noted truncal ataxia and minimal general instability. PT transported patient to gym in wc for energy conservation and time management. She ambulated with RW x60ft with CGA. Mild truncal ataxia noted. Patient ambulating same path again with Hurrycane and noted to have increased instability, but no LOB. Patient ambulating again with 1.5# ankle weights with mild improvement in decreasing deviation from path. Patient ambulating final time with 10# weight on walker, no ankle weights and demonstrated significantly less instability, but slower gait speed. Seated truncal perturbations provided with theraband and no overt LOB noted- patient able to maintain relative midline. Patient completed seated HSC, hip abd with red theraband 3x8. Patient ambulating back to her room with RW and CGA. Returning to bed, bed alarm on, call light within reach.   Therapy Documentation Precautions:  Precautions Precautions: Fall, Other (comment) Precaution Comments: h/o orthostatic hypotension and syncope during admission Restrictions Weight Bearing Restrictions: No   Therapy/Group: Individual Therapy  Karoline Caldwell, PT, DPT, CBIS  11/23/2020, 7:49 AM

## 2020-11-23 NOTE — Patient Care Conference (Signed)
Inpatient RehabilitationTeam Conference and Plan of Care Update Date: 11/23/2020   Time: 10:22 AM    Patient Name: Jenna Long      Medical Record Number: [id number]  Date of Birth: March 25, 1934 Sex: Female         Room/Bed: 4M07C/4M07C-01 Payor Info: Payor: [employer] / Plan: [employer] Must PPO / Product Type: *No Product type* /    Admit Date/Time:  11/18/2020  2:15 PM  Primary Diagnosis:  Temporal arteritis [facility])  Hospital Problems: Principal Problem:   Temporal arteritis ([place]) Active Problems:   H/O temporal arteritis    Expected Discharge Date: Expected Discharge Date: 11/26/20  Team Members Present: Physician leading conference: Dr. Alysia Penna Social Worker Present: Erlene Quan, BSW Nurse Present: Dorien Chihuahua, RN PT Present: Estevan Ryder, PT OT Present: Providence Lanius, OT PPS Coordinator present : Gunnar Fusi, SLP     Current Status/Progress Goal Weekly Team Focus  Bowel/Bladder   Continent of bowel and pt has periods of incontinence (urine); LBM 11/20/20  To be continent of bladder and bowel.  Toilet q 2 hours while awake and prn   Swallow/Nutrition/ Hydration             ADL's   min ambulatory toilet transfer with RW due to occasional LOB, LBD, full-body bathing, S for UBD and standing grooming  S bathroom transfers/ADL  self-care/balance/transfer retraining, func cognition/safety awareness, activity tolerance, pt/family AE/DME education   Mobility             Communication             Safety/Cognition/ Behavioral Observations            Pain   Pt denies pain  Remain pain free  Assess pain q shift and prn   Skin   Skin intact, scattered bruising  Remain free from skin breakdown and infection  Assess skin q shift and prn     Discharge Planning:  d/c home with daughter to provide 24-7 assistance   Team Discussion: Cerebellar CVA/ temporal arteritis with truncal ataxia. Prednisone taper affecting CBGs; MD adjusting  insulin dose. MD also adjusting BP meds to treat HTN.  Patient on target to meet rehab goals: yes, currently CGA for toileting , upper body bathing and lower body dressing with occasional loss of balance. Supervision overall for seated self care tasks. Supervision - CGA for mobility with a cane. Goals for discharge set for supervision level.   *See Care Plan and progress notes for long and short-term goals.   Revisions to Treatment Plan:  None  Teaching Needs: Safety, transfers, toileting, medications, secondary risk management, etc  Current Barriers to Discharge: Decreased caregiver support and Home enviroment access/layout  Possible Resolutions to Barriers: Family education with daughter OP neuro rehab follow up services     Medical Summary Current Status: BP still labile , + constipation, balance improving  Barriers to Discharge: Medical stability;Other (comments);Decreased family/caregiver support  Barriers to Discharge Comments: lives alone Possible Resolutions to [employer] Focus: cont to work on balance , BP management with avoidance of orthostasis   Continued Need for Acute Rehabilitation Level of Care: The patient requires daily medical management by a physician with specialized training in physical medicine and rehabilitation for the following reasons: Direction of a multidisciplinary physical rehabilitation program to maximize functional independence : Yes Medical management of patient stability for increased activity during participation in an intensive rehabilitation regime.: Yes Analysis of laboratory values and/or radiology reports with any subsequent need for medication adjustment  and/or medical intervention. : Yes   I attest that I was present, lead the team conference, and concur with the assessment and plan of the team.   Dorien Chihuahua B 11/23/2020, 11:44 AM

## 2020-11-24 LAB — GLUCOSE, CAPILLARY
Glucose-Capillary: 70 mg/dL (ref 70–99)
Glucose-Capillary: 185 mg/dL — ABNORMAL HIGH (ref 70–99)
Glucose-Capillary: 231 mg/dL — ABNORMAL HIGH (ref 70–99)
Glucose-Capillary: 229 mg/dL — ABNORMAL HIGH (ref 70–99)

## 2020-11-24 MED ORDER — AMLODIPINE BESYLATE 10 MG PO TABS: 10.0000 mg | ORAL_TABLET | Freq: Every day | ORAL | 0 refills | Status: DC

## 2020-11-24 MED ORDER — HYDROCODONE-ACETAMINOPHEN 5-325 MG PO TABS: 1.0000 | ORAL_TABLET | ORAL | 0 refills | Status: DC | PRN

## 2020-11-24 MED ORDER — ATORVASTATIN CALCIUM 40 MG PO TABS: 40.0000 mg | ORAL_TABLET | Freq: Every day | ORAL | 0 refills | Status: AC

## 2020-11-24 MED ORDER — CLOPIDOGREL BISULFATE 75 MG PO TABS: ORAL_TABLET | ORAL | 0 refills | Status: DC

## 2020-11-24 MED ORDER — DOCUSATE SODIUM 100 MG PO CAPS: 100.0000 mg | ORAL_CAPSULE | Freq: Two times a day (BID) | ORAL | 0 refills | Status: DC

## 2020-11-24 MED ORDER — INSULIN GLARGINE-YFGN 100 UNIT/ML ~~LOC~~ SOLN: 12.0000 [IU] | Freq: Every day | SUBCUTANEOUS | 11 refills | Status: DC

## 2020-11-24 MED ORDER — DORZOLAMIDE HCL 2 % OP SOLN: 1.0000 [drp] | Freq: Two times a day (BID) | OPHTHALMIC | 12 refills | Status: AC

## 2020-11-24 MED ORDER — PREDNISONE 10 MG PO TABS: ORAL_TABLET | ORAL | 1 refills | Status: DC

## 2020-11-24 MED ORDER — LOSARTAN POTASSIUM 25 MG PO TABS: 25.0000 mg | ORAL_TABLET | Freq: Every day | ORAL | 0 refills | Status: DC

## 2020-11-24 NOTE — Progress Notes (Signed)
Inpatient Rehabilitation Medication Review by a Pharmacist  A complete drug regimen review was completed for this patient to identify any potential clinically significant medication issues.  High Risk Drug Classes Is patient taking? Indication by Medication  Antipsychotic No   Anticoagulant Yes Enoxaparin for VTE proph  Antibiotic No   Opioid Yes Norco for moderate pain  Antiplatelet Yes Aspirin and Plavix for secondary prevention of stroke  Hypoglycemics/insulin Yes Semglee and SSI for DM  Vasoactive Medication Yes Amlodipine and losartan for HTN  Chemotherapy No   Other Yes Prednisone for temporal arteritis     Type of Medication Issue Identified Description of Issue Recommendation(s)  Drug Interaction(s) (clinically significant)     Duplicate Therapy     Allergy     No Medication Administration End Date     Incorrect Dose     Additional Drug Therapy Needed     Significant med changes from prior encounter (inform family/care partners about these prior to discharge).    Other  Metformin and sitagliptin not resumed. MVI not resumed. Resume as appropriate for DM.    Clinically significant medication issues were identified that warrant physician communication and completion of prescribed/recommended actions by midnight of the next day:  No  Name of provider notified for urgent issues identified: n/a  Provider Method of Notification: n/a    Pharmacist comments: Consider resuming metformin and sitagliptin as blood sugars require.   Time spent performing this drug regimen review (minutes):  [place], PharmD, [place], AAHIVP, CPP Infectious Disease Pharmacist 11/24/2020 8:38 AM

## 2020-11-24 NOTE — Progress Notes (Signed)
PROGRESS NOTE   Subjective/Complaints: Pt in good spirits. Denies any problems. Excited to be going home this weekend!  ROS: Patient denies fever, rash, sore throat, blurred vision, nausea, vomiting, diarrhea, cough, shortness of breath or chest pain, joint or back pain, headache, or mood change.   Objective:   No results found. No results for input(s): WBC, HGB, HCT, PLT in the last 72 hours. No results for input(s): NA, K, CL, CO2, GLUCOSE, BUN, CREATININE, CALCIUM in the last 72 hours.  Intake/Output Summary (Last 24 hours) at 11/24/2020 1032 Last data filed at 11/24/2020 0810 Gross per 24 hour  Intake 720 ml  Output --  Net 720 ml        Physical Exam: Vital Signs Blood pressure (!) 143/92, pulse 76, temperature 97.8 F (36.6 C), temperature source Oral, resp. rate 18, height 5\' 7"  (1.702 m), weight 59.4 kg, SpO2 98 %.   Constitutional: No distress . Vital signs reviewed. HEENT: NCAT, EOMI, oral membranes moist Neck: supple Cardiovascular: RRR without murmur. No JVD    Respiratory/Chest: CTA Bilaterally without wheezes or rales. Normal effort    GI/Abdomen: BS +, non-tender, non-distended Ext: no clubbing, cyanosis, or edema Psych: pleasant and cooperative  Skin:right temporal wound cdi Neurologic: Cranial nerves II through XII intact, motor strength is 5/5 in bilateral deltoid, bicep, tricep, grip, hip flexor, knee extensors, ankle dorsiflexor and plantar flexor Normal sensation and cerebellar  Musculoskeletal: Full ROM, No pain with AROM or PROM in the neck, trunk, or extremities. Posture appropriate     Assessment/Plan: 1. Functional deficits which require 3+ hours per day of interdisciplinary therapy in a comprehensive inpatient rehab setting. Physiatrist is providing close team supervision and 24 hour management of active medical problems listed below. Physiatrist and rehab team continue to assess  barriers to discharge/monitor patient progress toward functional and medical goals  Care Tool:  Bathing    Body parts bathed by patient: Right arm, Left arm, Chest, Abdomen, Front perineal area, Buttocks, Right upper leg, Left upper leg, Face, Right lower leg, Left lower leg   Body parts bathed by helper: Left lower leg, Right lower leg     Bathing assist Assist Level: Contact Guard/Touching assist     Upper Body Dressing/Undressing Upper body dressing   What is the patient wearing?: Pull over shirt    Upper body assist Assist Level: Set up assist    Lower Body Dressing/Undressing Lower body dressing      What is the patient wearing?: Incontinence brief, Pants     Lower body assist Assist for lower body dressing: Contact Guard/Touching assist     Toileting Toileting    Toileting assist Assist for toileting: Minimal Assistance - Patient > 75%     Transfers Chair/bed transfer  Transfers assist     Chair/bed transfer assist level: Minimal Assistance - Patient > 75%     Locomotion Ambulation   Ambulation assist      Assist level: Minimal Assistance - Patient > 75% Assistive device: Hand held assist Max distance: 10   Walk 10 feet activity   Assist     Assist level: Minimal Assistance - Patient > 75% Assistive device: Hand held  assist   Walk 50 feet activity   Assist Walk 50 feet with 2 turns activity did not occur: Safety/medical concerns         Walk 150 feet activity   Assist Walk 150 feet activity did not occur: Safety/medical concerns         Walk 10 feet on uneven surface  activity   Assist Walk 10 feet on uneven surfaces activity did not occur: Safety/medical concerns         Wheelchair     Assist Is the patient using a wheelchair?: Yes (per PT note) Type of Wheelchair: Manual    Wheelchair assist level: Minimal Assistance - Patient > 75% Max wheelchair distance: 150    Wheelchair 50 feet with 2 turns  activity    Assist        Assist Level: Minimal Assistance - Patient > 75%   Wheelchair 150 feet activity     Assist      Assist Level: Minimal Assistance - Patient > 75%   Blood pressure (!) 143/92, pulse 76, temperature 97.8 F (36.6 C), temperature source Oral, resp. rate 18, height 5\' 7"  (1.702 m), weight 59.4 kg, SpO2 98 %.  Medical Problem List and Plan: 1.  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   PREDNISONE TAPER BY 10 MG EVERY 2-WEEK AND FOLLOW UP NEUROLOGY/RHEUMATOLOGY Right cerebellar infarcts             -patient may shower             -ELOS/Goals: 10/8           -Continue CIR therapies including PT, OT   2.  Antithrombotics: -DVT/anticoagulation:  Pharmaceutical: Lovenox             -antiplatelet therapy: Aspirin 81 mg daily and Plavix 75 mg day x3 weeks then aspirin alone 3. Pain Management: Hydrocodone as needed 4. Mood: Provide emotional support             -antipsychotic agents: N/A 5. Neuropsych: This patient is capable of making decisions on her own behalf. 6. Skin/Wound Care: Routine skin checks 7. Fluids/Electrolytes/Nutrition: Routine in and outs                8.  Hypertension.  Norvasc 5 mg daily, Cozaar 25 mg daily.   Vitals:   11/23/20 1943 11/24/20 0529  BP: (!) 144/67 (!) 143/92  Pulse: 65 76  Resp: 16 18  Temp: (!) 97.5 F (36.4 C) 97.8 F (36.6 C)  SpO2: 99% 98%  10/6 improvement with increase of amlodipine to 10mg   --follow  9.  Hyperlipidemia for Lipitor 10.  Diabetes mellitus.  Hemoglobin A1c 6.3.  Semglee 8 units twice daily             Monitor with increased mobility CBG (last 3)  Recent Labs    11/23/20 1614 11/23/20 2018 11/24/20 0616  GLUCAP 254* 178* 70  Added hs snack with some improvement  10/6 -sugars still high in PM. Coverage also leading to AM hypoglycemia  -increased am semglee to  12u, pm dose stopped d/t steroid decrease    LOS: 6 days A FACE TO FACE EVALUATION WAS PERFORMED  Meredith Staggers 11/24/2020, 10:32 AM

## 2020-11-24 NOTE — Progress Notes (Signed)
Occupational Therapy Session Note  Patient Details  Name: Jenna Long MRN: [id number] Date of Birth: 08/11/1934  Today's Date: 11/24/2020 OT Group Time: 1103-1203 OT Group Time Calculation (min): 60 min   Short Term Goals: Week 1:  OT Short Term Goal 1 (Week 1): STG = LTG d/t ELOS  Skilled Therapeutic Interventions/Progress Updates:  Pt participated in group session with a focus on kitchen tasks/mobility, energy conservation strategies ( ECS) and safe kitchen set- up for DC. Pt reports she wants to be able to make simple meals at home but reports most of her kitchen has already been modified to her needs d/t prior hip surgeries. Discussed energy conservation strategies in relation to kitchen tasks such as using RW bag, rearranging frequently used kitchen items and eliminating potential fall risks in the kitchen. Discussed how to transport hot coffee in kitchen and utilize counter tops to maneuver items in kitchen. Pt participating in group conversation of safety questions such as "how would you clean up spilled water?", "what do you do if the smoke alarm goes off?", " what would you do if there was a fire?", and "what would you do if you drop something?" Pt actively participating in group discussions and even offering advice to other group participants. Issued pt RW bag at end of session to assist with transporting items in kitchen and around the home.  Issued pt educational handout to increase carryover about ECS and community re-entry. Pt transported back to room by RT.  Therapy Documentation Precautions:  Precautions Precautions: Fall, Other (comment) Precaution Comments: h/o orthostatic hypotension and syncope during admission Restrictions Weight Bearing Restrictions: No  Pain: pt reports no pain during session     Therapy/Group: Group Therapy  Precious Haws 11/24/2020, 12:28 PM

## 2020-11-24 NOTE — Progress Notes (Signed)
Occupational Therapy Discharge Summary  Patient Details  Name: Jenna Long MRN: [id number] Date of Birth: 1934-12-10   Patient has met 28 of 82 long term goals due to improved activity tolerance, improved balance, postural control, ability to compensate for deficits, functional use of  RIGHT upper and LEFT upper extremity, improved attention, improved awareness, and improved coordination.  Patient to discharge at overall Supervision level.  Patient's care partner unavailable to provide the necessary physical and cognitive assistance at discharge.    Reasons goals not met: NA  Recommendation:  Patient will benefit from ongoing skilled OT services in outpatient setting to continue to advance functional skills in the area of BADL, iADL, and Reduce care partner burden.  Equipment: TTB  Reasons for discharge: treatment goals met and discharge from hospital  Patient/family agrees with progress made and goals achieved: Yes  OT Discharge Precautions/Restrictions  Precautions Precautions: Fall Restrictions Weight Bearing Restrictions: No  ADL ADL Eating: Independent Where Assessed-Eating: Wheelchair, Chair, Edge of bed Grooming: Supervision/safety Where Assessed-Grooming: Standing at sink, Sitting at sink Upper Body Bathing: Supervision/safety Where Assessed-Upper Body Bathing: Sitting at sink Lower Body Bathing: Supervision/safety Where Assessed-Lower Body Bathing: Standing at sink Upper Body Dressing: Supervision/safety Where Assessed-Upper Body Dressing: Sitting at sink Lower Body Dressing: Supervision/safety Where Assessed-Lower Body Dressing: Edge of bed, Wheelchair Toileting: Supervision/safety Where Assessed-Toileting: Glass blower/designer: Close supervision Toilet Transfer Method: Counselling psychologist: Energy manager: Close supervison Clinical cytogeneticist Method: Optometrist: Civil engineer, contracting: Not assessed Vision Baseline Vision/History: 1 Wears glasses (readers) Patient Visual Report: Peripheral vision impairment (R) Vision Assessment?: Yes Eye Alignment: Within Functional Limits Ocular Range of Motion: Restricted on the left Tracking/Visual Pursuits: Decreased smoothness of vertical tracking;Decreased smoothness of horizontal tracking Saccades: Decreased speed of saccadic movement;Additional head turns occurred during testing Convergence: Impaired (comment) Visual Fields: Right visual field deficit Perception  Perception: Impaired Inattention/Neglect: Does not attend to right visual field Praxis Praxis: Intact Cognition Overall Cognitive Status: Impaired/Different from baseline Arousal/Alertness: Awake/alert Orientation Level: Oriented X4 Year: 2022 Month: October Day of Week: Correct Memory: Appears intact Immediate Memory Recall: Sock;Blue;Bed Memory Recall Sock: Not able to recall Memory Recall Blue: Without Cue Memory Recall Bed: Without Cue Safety/Judgment: Appears intact Comments: mild decreased awareness of deficits Sensation Sensation Light Touch: Appears Intact Hot/Cold: Appears Intact Proprioception: Appears Intact Stereognosis: Appears Intact Coordination Gross Motor Movements are Fluid and Coordinated: No Fine Motor Movements are Fluid and Coordinated: No Coordination and Movement Description: RLE decreased smoothness- mild ataxia/dysmetria Finger Nose Finger Test: BUE decreased smoothness- mild ataxia/dysmetria R>L Motor  Motor Motor: Hemiplegia;Abnormal postural alignment and control Motor - Discharge Observations: mild R hemi Mobility  Bed Mobility Bed Mobility: Supine to Sit Supine to Sit: Independent Transfers Sit to Stand: Supervision/Verbal cueing Stand to Sit: Supervision/Verbal cueing  Trunk/Postural Assessment  Cervical Assessment Cervical Assessment: Exceptions to [facility] (forward head) Thoracic Assessment Thoracic  Assessment: Exceptions to [facility] (rounded shoulders) Lumbar Assessment Lumbar Assessment: Exceptions to [facility] (post pelvic tilt) Postural Control Postural Control: Within Functional Limits  Balance Balance Balance Assessed: Yes Static Sitting Balance Static Sitting - Balance Support: Feet supported Static Sitting - Level of Assistance: 6: Modified independent (Device/Increase time) Static Sitting - Comment/# of Minutes: increased time Dynamic Sitting Balance Dynamic Sitting - Balance Support: Feet supported Dynamic Sitting - Level of Assistance: 5: Stand by assistance Dynamic Sitting Balance - Compensations: S Static Standing Balance Static Standing - Balance Support: Bilateral upper extremity supported;During functional activity Static  Standing - Level of Assistance: 5: Stand by assistance Static Standing - Comment/# of Minutes: S Dynamic Standing Balance Dynamic Standing - Balance Support: During functional activity;Bilateral upper extremity supported Dynamic Standing - Level of Assistance: 5: Stand by assistance Dynamic Standing - Comments: S Extremity/Trunk Assessment RUE Assessment RUE Assessment: Within Functional Limits [facility] for ADL assessed) General Strength Comments: mild ataxia/hemi, 4/5 in shoulder flexion RUE Body System: Neuro Brunstrum levels for arm and hand: Hand;Arm Brunstrum level for arm: Stage V Relative Independence from Synergy Brunstrum level for hand: Stage VI Isolated joint movements LUE Assessment LUE Assessment: Within Functional Limits (WFL for ADL assessed) General Strength Comments: mild ataxia, 4/5 in shoulder flexion   Volanda Napoleon MS, OTR/L  11/25/2020, 6:56 AM

## 2020-11-24 NOTE — Progress Notes (Signed)
Occupational Therapy Session Note  Patient Details  Name: Jenna Long MRN: [id number] Date of Birth: 1935/01/06  Today's Date: 11/24/2020 OT Individual Time: 0701-0809 OT Individual Time Calculation (min): 68 min    Short Term Goals: Week 1:  OT Short Term Goal 1 (Week 1): STG = LTG d/t ELOS  Skilled Therapeutic Interventions/Progress Updates:    Pt received semi-reclined in bed, denies pain, agreeable to therapy. Session focus on self-care retraining, activity tolerance, dynamic standing balance, R visual scanning, IADL retraining, falls risk education in prep for improved ADL/IADL/func mobility performance + decreased caregiver burden.  Came to sitting EOB mod I. Doffed hospital gown and donned shirt with ind. Donned pants with S for balance + increased time for sit to stand. Amb throughout session with RW with S, noted to come close to therapist on R/ wall, but did not collide with obstacles. Complete oral and hair care in standing with S. Amb to kitchen with RW + S. Pt able to gather items necessary to microwave oatmeal with CGA and heavy cuing for safe transport of items with RW despite initial visual demonstration. Reports granddaughter will take care of cooking primarily. Discussed having granddaughter assist with transport of items to dining room and to set-up pt's personal/most frequently used items within easy reach for pt. Pt able to identify 4/5 safety/ fall risks in kitchen (open cabinets, loose electrical cords, etc), req cues to identify running water.   In [place], completed 5 passes of obstacle course (involving stepping over 1 inch objects, weaving in out cones, carrying glass of water, etc.) with use of hurry-cane. No overt LOB but pt noted to be unsteady with narrow base of support, did not spill water, but with difficulty clearing 1 inch objects.   Amb transfer back to recliner same manner as above.  Pt left seated in recliner with safety belt alarm engaged, call bell in  reach, and all immediate needs met.    Therapy Documentation Precautions:  Precautions Precautions: Fall, Other (comment) Precaution Comments: h/o orthostatic hypotension and syncope during admission Restrictions Weight Bearing Restrictions: No  Pain: denies   ADL: See Care Tool for more details.  Therapy/Group: Individual Therapy  Volanda Napoleon MS, OTR/L  11/24/2020, 6:40 AM

## 2020-11-24 NOTE — Progress Notes (Signed)
Physical Therapy Session Note  Patient Details  Name: Jenna Long MRN: [id number] Date of Birth: January 12, 1935  Today's Date: 11/24/2020 PT Individual Time: 1600-1700 PT Individual Time Calculation (min): 60 min   Short Term Goals: Week 1:  PT Short Term Goal 1 (Week 1): STG=LTG due to ELOS Week 2:    Week 3:     Skilled Therapy  Pt initially oob in recliner, denies pain, agreeable to session.  Dos shoes w/set up. Sit to stand to RW w/close supervision.  Gait >164ft w/rw w/cga.    Dynamic balance, worked on transferring objects to/from multilevel sufaces including cross body transfer using single UE and no UE support, cga only for all.  Standing w/RW, dual UE support alt tapping 5in step cga.  When attempting to perform w/single UE support requires mod assist for balance/unable without assist.  Functional gait: Pt worked on Best boy around furntiure placed thru gym to simulate home environment, path included 5x STS, backing, negotiating tight spaces w/walker, cga to occasional min assist due to mild post loss of balance w/backing/negotiating tight space.   Stairs:,  Pt ascended/descended 4 stairs w/2 rails w/cga Step to gait pattern.  Gait 140ft w/RW, cga. Pt left oob in recliner w/chair alarm set and needs in reach.  Pt demonstrated occasional post tendency w/transitional movements/Sit to stand and w/backing w/RW.  Therapy Documentation Precautions:  Precautions Precautions: Fall, Other (comment) Precaution Comments: h/o orthostatic hypotension and syncope during admission Restrictions Weight Bearing Restrictions: No     Therapy/Group: Individual Therapy Callie Fielding, [place] 11/24/2020, 4:51 PM

## 2020-11-24 NOTE — Progress Notes (Signed)
Inpatient Rehabilitation Care Coordinator Discharge Note   Patient Details  Name: Jenna Long MRN: [id number] Date of Birth: 1934-12-07   Discharge location: Home  Length of Stay: 18 Days  Discharge activity level: sup/cga  Home/community participation: daughter assisting at home  Patient response [id number] Literacy - How often do you need to have someone help you when you read instructions, pamphlets, or other written material from your doctor or pharmacy?: Often  Patient response [id number] Isolation - How often do you feel lonely or isolated from those around you?: Never  Services provided included: Neuropsych, Pharmacy, TR, CM, RN, SLP, OT, PT, MD, RD  Financial Services:  Financial Services Utilized: Private Insurance HTA  Choices offered to/list presented to: Pt and daughter (grand daughter)  Follow-up services arranged:  Outpatient    Outpatient Servicies: [facility]      Patient response to transportation need: Is the patient able to respond to transportation needs?: Yes In the past 12 months, has lack of transportation kept you from medical appointments or from getting medications?: No In the past 12 months, has lack of transportation kept you from meetings, work, or from getting things needed for daily living?: No    Comments (or additional information):  Patient/Family verbalized understanding of follow-up arrangements:     Individual responsible for coordination of the follow-up plan: Butch Penny [phone]  Confirmed correct DME delivered: Dyanne Iha 11/24/2020    Dyanne Iha

## 2020-11-24 NOTE — Discharge Summary (Addendum)
Physician Discharge Summary  Patient ID: Jenna Long MRN: [id number] DOB/AGE: 1934-10-12 86 y.o.  Admit date: 11/18/2020 Discharge date: 11/26/2020  Discharge Diagnoses:  Principal Problem:   Temporal arteritis ([place]) Active Problems:   H/O temporal arteritis DVT prophylaxis Hypertension Hyperlipidemia Diabetes mellitus  Discharged Condition: Stable  Significant Diagnostic Studies: CT ANGIO HEAD NECK W WO CM  Result Date: 11/14/2020 CLINICAL DATA:  Syncope, recurrent EXAM: CT ANGIOGRAPHY HEAD AND NECK TECHNIQUE: Multidetector CT imaging of the head and neck was performed using the standard protocol during bolus administration of intravenous contrast. Multiplanar CT image reconstructions and MIPs were obtained to evaluate the vascular anatomy. Carotid stenosis measurements (when applicable) are obtained utilizing NASCET criteria, using the distal internal carotid diameter as the denominator. CONTRAST:  22mL OMNIPAQUE IOHEXOL 350 MG/ML SOLN COMPARISON:  11/08/2020 FINDINGS: CT HEAD Brain: There is no acute intracranial hemorrhage, mass effect, or edema. Gray-white differentiation is preserved. There is no extra-axial fluid collection. Ventricles and sulci are stable in size and configuration. Stable findings of chronic microvascular ischemic changes in the cerebral white matter. Vascular: No hyperdense vessel. Skull: Calvarium is unremarkable. Sinuses/Orbits: No acute finding. Other: None. Review of the MIP images confirms the above findings CTA NECK Aortic arch: Stable appearance.  Great vessel origins are patent. Right carotid system: Stable appearance. Minimal calcified plaque at the ICA origin. No stenosis. Partially retropharyngeal course. Left carotid system: Stable. Calcified plaque along the distal common carotid and proximal internal carotid with less than 50% stenosis. Vertebral arteries: Stable appearance of patent left vertebral artery with calcified plaque causing stenosis at the  origin. Multifocal severe stenosis of the right vertebral artery with occlusion at the C2-C3 level. Decreased reconstitution of proximal right V3 segment. Skeleton: Stable appearance. Other neck: No new findings. Upper chest: No new findings. Review of the MIP images confirms the above findings CTA HEAD Anterior circulation: Intracranial internal carotid arteries are patent with calcified plaque causing similar mild narrowing, left greater than right. Anterior and middle cerebral arteries are patent. Anterior and middle cerebral arteries are patent. Stable appearance of bilateral stenoses. Posterior circulation: Stable appearance of partially opacified and irregular appearing intracranial right vertebral artery. Stable appearance of patent left vertebral artery and plaque. Stable appearance of basilar artery with areas of stenosis. Stable appearance of posterior cerebral arteries with areas of stenosis. Venous sinuses: Patent as allowed by contrast bolus timing. Review of the MIP images confirms the above findings IMPRESSION: No acute intracranial abnormality. No substantial change in appearance of vascular imaging compared to recent prior study with multifocal atherosclerosis and stenoses of anterior and posterior circulations. Electronically Signed   By: Macy Mis M.D.   On: 11/14/2020 16:13   CT ANGIO HEAD NECK W WO CM  Result Date: 11/08/2020 CLINICAL DATA:  Stroke/TIA, assess intracranial arteries EXAM: CT ANGIOGRAPHY HEAD AND NECK TECHNIQUE: Multidetector CT imaging of the head and neck was performed using the standard protocol during bolus administration of intravenous contrast. Multiplanar CT image reconstructions and MIPs were obtained to evaluate the vascular anatomy. Carotid stenosis measurements (when applicable) are obtained utilizing NASCET criteria, using the distal internal carotid diameter as the denominator. CONTRAST:  58mL OMNIPAQUE IOHEXOL 350 MG/ML SOLN COMPARISON:  MRI of the same day.   CT head from 11/07/2020. FINDINGS: CT HEAD FINDINGS Brain: Small right cerebellar infarcts better characterized on same-day MRI. No evidence of interval acute large vascular territory infarct, acute hemorrhage, mass lesion, midline shift, hydrocephalus, or extra-axial fluid collection. Small remote lacunar infarcts also better  seen on prior MRI. Patchy white matter hypoattenuation, nonspecific but compatible with chronic microvascular ischemic disease Vascular: See below Skull: No acute fracture. Sinuses: Clear sinuses. Orbits: See same day MRI orbits for more sensitive evaluation. Review of the MIP images confirms the above findings CTA NECK FINDINGS Aortic arch: Atherosclerosis.  Great vessel origins are patent. Right carotid system: Mild atherosclerosis without significant (greater than 50%) stenosis. Retropharyngeal course of the ICA. Left carotid system: Moderate atherosclerosis at the carotid bifurcation without greater than 50% stenosis. Vertebral arteries: Multifocal severe stenosis of the right vertebral artery with occlusion at the C2-C3 level. Skeleton: Other neck: No acute abnormality. Upper chest: Biapical pleural-parenchymal scarring. Otherwise, visualized lung apices are clear. Review of the MIP images confirms the above findings CTA HEAD FINDINGS Anterior circulation: Bilateral intracranial ICAs are patent with mild narrowing due to calcific atherosclerosis. Severe narrowing of the distal M1 and proximal M2 MCA branches bilaterally. Bilateral A1 ACAs are patent. Severe stenosis of the left A2 ACA. Mild right A2 ACA stenosis. Posterior circulation: Irregular opacification of a small right V3/V4 vertebral artery from muscular collaterals. Moderate stenosis of the proximal left intradural vertebral artery. Severe stenosis of the mid and distal basilar artery. Severe stenosis of the right P2 PCA. Small left P1 PCA with severe stenosis of the proximal left P2 PCA. Venous sinuses: As permitted by contrast  timing, patent. Review of the MIP images confirms the above findings IMPRESSION: CT head: 1. Small inferior right cerebellar infarcts better seen on same-day MRI. 2. No evidence of interval acute abnormality. CTA: 1. Severe stenosis of the mid and distal basilar artery and bilateral P2 PCAs. 2. Severe stenosis of the distal M1 and proximal M2 MCA branches bilaterally. 3. Severe stenosis of the left A2 ACA. 4. Multifocal severe stenosis of the right vertebral artery in the neck with occlusion at the C2-C3 level and irregular opacification of a small V3/V4 vertebral artery from collaterals. 5. Moderate stenosis of the intradural left vertebral artery. 6. Bilateral carotid bifurcation atherosclerosis without greater than 50% stenosis. Electronically Signed   By: Margaretha Sheffield M.D.   On: 11/08/2020 18:41   CT HEAD WO CONTRAST  Result Date: 11/07/2020 CLINICAL DATA:  Headache. EXAM: CT HEAD WITHOUT CONTRAST TECHNIQUE: Contiguous axial images were obtained from the base of the skull through the vertex without intravenous contrast. COMPARISON:  Jun 27, 2020 FINDINGS: Brain: There is mild cerebral atrophy with widening of the extra-axial spaces and ventricular dilatation. There are areas of decreased attenuation within the white matter tracts of the supratentorial brain, consistent with microvascular disease changes. Vascular: No hyperdense vessel or unexpected calcification. Skull: Normal. Negative for fracture or focal lesion. Sinuses/Orbits: No acute finding. Other: None. IMPRESSION: 1. Generalized cerebral atrophy. 2. No acute intracranial abnormality. Electronically Signed   By: Virgina Norfolk M.D.   On: 11/07/2020 21:26   MR Brain W and Wo Contrast  Addendum Date: 11/08/2020   ADDENDUM REPORT: 11/08/2020 12:45 ADDENDUM: MRI brain impressions 2 and 3, and MRI orbits impression 2 discussed with Dr. Langston Masker of the emergency department by telephone at 12:40 p.m. on 11/08/2020. Electronically Signed   By: Kellie Simmering D.O.   On: 11/08/2020 12:45   Result Date: 11/08/2020 CLINICAL DATA:  Vision loss, binocular; right-sided complete vision loss in setting of sinus infection, abducent nerve palsy right eye, differential including GCA versus cavernous thrombosis/infection. EXAM: MRI HEAD AND ORBITS WITHOUT AND WITH CONTRAST TECHNIQUE: Multiplanar, multiecho pulse sequences of the brain and surrounding structures were obtained  without and with intravenous contrast. Multiplanar, multiecho pulse sequences of the orbits and surrounding structures were obtained including fat saturation techniques, before and after intravenous contrast administration. CONTRAST:  73mL GADAVIST GADOBUTROL 1 MMOL/ML IV SOLN COMPARISON:  Head CT 11/07/2020. FINDINGS: MRI HEAD FINDINGS Mild intermittent motion degradation. Brain: Mild for age generalized cerebral and cerebellar atrophy. There are two subcentimeter acute infarcts within the inferior right cerebellar hemisphere (for instance as seen on series 2, images 8 and 9). Chronic lacunar infarcts within the left cerebral hemispheric white matter, bilateral basal ganglia and bilateral thalami. Background moderate multifocal T2/FLAIR hyperintensity within the cerebral white matter and pons, nonspecific but compatible with chronic small vessel ischemic disease. Chronic microhemorrhage within the periventricular left frontal lobe (series 7, image 71). No evidence of an intracranial mass. No extra-axial fluid collection. No midline shift. Partially empty sella turcica. No pathologic intracranial enhancement identified. Vascular: Signal abnormality within the right vertebral artery at the level of the foramen magnum suggesting high-grade stenosis or vessel occlusion at this site (series 5, image 3). Skull and upper cervical spine: No focal suspicious marrow lesion. MRI ORBITS FINDINGS Mild intermittent motion degradation. Orbits: Bilateral lens replacements. The globes are normal in size and contour.  The extraocular muscles are symmetric and unremarkable. There is abnormal enhancement along the optic nerve sheaths bilaterally, compatible with bilateral optic nerve perineuritis. No intraorbital mass identified. Visualized sinuses: Trace mucosal thickening within the bilateral ethmoid air cells. Soft tissues: The visualized maxillofacial and upper neck soft tissues are unremarkable. IMPRESSION: MRI brain: 1. Mildly motion degraded exam. 2. Two subcentimeter acute infarcts within the inferior right cerebellar hemisphere. 3. Signal abnormality within the right vertebral artery at the level of the foramen magnum, suggesting high-grade stenosis or vessel occlusion at this site. 4. Chronic lacunar infarcts within the left cerebral hemispheric white matter, bilateral basal ganglia, and bilateral thalami. Background moderate chronic small vessel ischemic changes within the cerebral white matter, and within the pons. 5. Mild generalized parenchymal atrophy. MRI orbits: 1. Mildly motion degraded exam. 2. Abnormal enhancement along the optic nerve sheaths bilaterally, compatible with bilateral optic nerve perineuritis. 3. Minimal mucosal thickening within the bilateral ethmoid air cells. Electronically Signed: By: Kellie Simmering D.O. On: 11/08/2020 12:31   ECHOCARDIOGRAM COMPLETE  Result Date: 11/10/2020    ECHOCARDIOGRAM REPORT   Patient Name:   Jenna Long Date of Exam: 11/10/2020 Medical Rec #:  [id number]      Height:       67.0 in Accession #:    [id number]     Weight:       132.3 lb Date of Birth:  April 17, 1934       BSA:          1.696 m Patient Age:    72 years       BP:           160/84 mmHg Patient Gender: F              HR:           78 bpm. Exam Location:  Inpatient Procedure: 2D Echo, Cardiac Doppler and Color Doppler Indications:    CVA  History:        Patient has no prior history of Echocardiogram examinations.  Sonographer:    [place] Referring Phys: [id number] [place]  1. Left ventricular  ejection fraction, by estimation, is 70 to 75%. The left ventricle has hyperdynamic function. The left ventricle has no regional wall motion abnormalities.  There is mild left ventricular hypertrophy. Left ventricular diastolic parameters are consistent with Grade I diastolic dysfunction (impaired relaxation).  2. Right ventricular systolic function is normal. The right ventricular size is normal.  3. The mitral valve is normal in structure. No evidence of mitral valve regurgitation. No evidence of mitral stenosis.  4. The aortic valve is tricuspid. Aortic valve regurgitation is not visualized. Mild aortic valve sclerosis is present, with no evidence of aortic valve stenosis.  5. The inferior vena cava is normal in size with greater than 50% respiratory variability, suggesting right atrial pressure of 3 mmHg. FINDINGS  Left Ventricle: Left ventricular ejection fraction, by estimation, is 70 to 75%. The left ventricle has hyperdynamic function. The left ventricle has no regional wall motion abnormalities. The left ventricular internal cavity size was normal in size. There is mild left ventricular hypertrophy. Left ventricular diastolic parameters are consistent with Grade I diastolic dysfunction (impaired relaxation). Right Ventricle: The right ventricular size is normal. Right ventricular systolic function is normal. Left Atrium: Left atrial size was normal in size. Right Atrium: Right atrial size was normal in size. Pericardium: There is no evidence of pericardial effusion. Mitral Valve: The mitral valve is normal in structure. No evidence of mitral valve regurgitation. No evidence of mitral valve stenosis. Tricuspid Valve: The tricuspid valve is normal in structure. Tricuspid valve regurgitation is trivial. No evidence of tricuspid stenosis. Aortic Valve: The aortic valve is tricuspid. Aortic valve regurgitation is not visualized. Mild aortic valve sclerosis is present, with no evidence of aortic valve stenosis.  Aortic valve mean gradient measures 10.0 mmHg. Aortic valve peak gradient measures 19.7 mmHg. Aortic valve area, by VTI measures 2.63 cm. Pulmonic Valve: The pulmonic valve was not well visualized. Pulmonic valve regurgitation is not visualized. No evidence of pulmonic stenosis. Aorta: The aortic root is normal in size and structure. Venous: The inferior vena cava is normal in size with greater than 50% respiratory variability, suggesting right atrial pressure of 3 mmHg. IAS/Shunts: No atrial level shunt detected by color flow Doppler.  LEFT VENTRICLE PLAX 2D LVIDd:         4.00 cm     Diastology LVIDs:         2.50 cm     LV e' medial:    5.44 cm/s LV PW:         1.60 cm     LV E/e' medial:  13.8 LV IVS:        1.20 cm     LV e' lateral:   6.74 cm/s LVOT diam:     2.30 cm     LV E/e' lateral: 11.2 LV SV:         108 LV SV Index:   64 LVOT Area:     4.15 cm  LV Volumes (MOD) LV vol d, MOD A4C: 40.7 ml LV vol s, MOD A4C: 16.4 ml LV SV MOD A4C:     40.7 ml RIGHT VENTRICLE             IVC RV S prime:     11.20 cm/s  IVC diam: 2.10 cm TAPSE (M-mode): 2.4 cm LEFT ATRIUM             Index       RIGHT ATRIUM           Index LA diam:        1.70 cm 1.00 cm/m  RA Area:     14.50 cm LA Vol (A2C):  35.0 ml 20.63 ml/m RA Volume:   29.90 ml  17.63 ml/m LA Vol (A4C):   17.9 ml 10.55 ml/m LA Biplane Vol: 26.7 ml 15.74 ml/m  AORTIC VALVE                    PULMONIC VALVE AV Area (Vmax):    2.60 cm     PV Vmax:       0.89 m/s AV Area (Vmean):   2.67 cm     PV Peak grad:  3.1 mmHg AV Area (VTI):     2.63 cm AV Vmax:           222.00 cm/s AV Vmean:          152.000 cm/s AV VTI:            0.412 m AV Peak Grad:      19.7 mmHg AV Mean Grad:      10.0 mmHg LVOT Vmax:         139.00 cm/s LVOT Vmean:        97.700 cm/s LVOT VTI:          0.261 m LVOT/AV VTI ratio: 0.63  AORTA Ao Root diam: 3.00 cm Ao Asc diam:  3.40 cm MITRAL VALVE MV Area (PHT): 2.12 cm     SHUNTS MV E velocity: 75.30 cm/s   Systemic VTI:  0.26 m MV A  velocity: 132.00 cm/s  Systemic Diam: 2.30 cm MV E/A ratio:  0.57 Kirk Ruths MD Electronically signed by Kirk Ruths MD Signature Date/Time: 11/10/2020/12:42:08 PM    Final    VAS [place] TEMPORAL ARTERY BILATERAL  Result Date: 11/10/2020  TEMPORAL ARTERY REPORT Patient Name:  Jenna Long  Date of Exam:   11/10/2020 Medical Rec #: [id number]       Accession #:    [id number] Date of Birth: 1934-03-24        Patient Gender: F Patient Age:   44 years Exam Location:  [facility] Procedure:      VAS [place] Referring Phys: Dagoberto Ligas --------------------------------------------------------------------------------  Indications: Gradual loss of vision- right eye and headache. High Risk Factors: Age > 75 yrs and female.  Comparison Study: No prior study Performing Technologist: Maudry Mayhew MHA, RDMS, RVT, RDCS  Examination Guidelines: Patient in reclined position. 2D, color and spectral doppler sampling in the temporal artery along the hairline and temple in the longitudinal plane. 2D images along the hairline and temple in the transverse plane. Exam is bilateral.  ++-------------------+------------------+ Right Halo POSITIVELeft Halo POSITIVE ++-------------------+------------------+ +---------------+----------+-----------+----------+-----------+                Width (cm)Lenght (cm)Width (cm)Lenght (cm) +---------------+----------+-----------+----------+-----------+ Temporal Artery0.10                                       +---------------+----------+-----------+----------+-----------+ Proximal       0.10                                       +---------------+----------+-----------+----------+-----------+ Mid            0.05                                       +---------------+----------+-----------+----------+-----------+  Distal         0.04                                       +---------------+----------+-----------+----------+-----------+  Parietal branch0.02                                       +---------------+----------+-----------+----------+-----------+ Frontal branch 0.04                                       +---------------+----------+-----------+----------+-----------+ Right superficial temporal artery exhibits hyperechoic halo sign with intimal thickness measuring 0.1cm. The right superficial temporal artery is noncompressible. Left superficial temporal artery without obvious evidence of halo sign. Left superficial temporal artery is compressible. Study was technically difficult due to vessel tortuosity/cording. Summary: Absence of a "halo" sign in the left temporal artery, although not definitive, makes a diagnosis of temporal arteritis unlikely. Presence of a "halo" sign in the right temporal artery suggests temporal arteritis.  *See table(s) above for measurements and observations.  Electronically signed by Jamelle Haring on 11/10/2020 at 6:55:05 PM.   Final    MR ORBITS W WO CONTRAST  Addendum Date: 11/08/2020   ADDENDUM REPORT: 11/08/2020 12:45 ADDENDUM: MRI brain impressions 2 and 3, and MRI orbits impression 2 discussed with Dr. Langston Masker of the emergency department by telephone at 12:40 p.m. on 11/08/2020. Electronically Signed   By: Kellie Simmering D.O.   On: 11/08/2020 12:45   Result Date: 11/08/2020 CLINICAL DATA:  Vision loss, binocular; right-sided complete vision loss in setting of sinus infection, abducent nerve palsy right eye, differential including GCA versus cavernous thrombosis/infection. EXAM: MRI HEAD AND ORBITS WITHOUT AND WITH CONTRAST TECHNIQUE: Multiplanar, multiecho pulse sequences of the brain and surrounding structures were obtained without and with intravenous contrast. Multiplanar, multiecho pulse sequences of the orbits and surrounding structures were obtained including fat saturation techniques, before and after intravenous contrast administration. CONTRAST:  68mL GADAVIST GADOBUTROL 1 MMOL/ML IV  SOLN COMPARISON:  Head CT 11/07/2020. FINDINGS: MRI HEAD FINDINGS Mild intermittent motion degradation. Brain: Mild for age generalized cerebral and cerebellar atrophy. There are two subcentimeter acute infarcts within the inferior right cerebellar hemisphere (for instance as seen on series 2, images 8 and 9). Chronic lacunar infarcts within the left cerebral hemispheric white matter, bilateral basal ganglia and bilateral thalami. Background moderate multifocal T2/FLAIR hyperintensity within the cerebral white matter and pons, nonspecific but compatible with chronic small vessel ischemic disease. Chronic microhemorrhage within the periventricular left frontal lobe (series 7, image 71). No evidence of an intracranial mass. No extra-axial fluid collection. No midline shift. Partially empty sella turcica. No pathologic intracranial enhancement identified. Vascular: Signal abnormality within the right vertebral artery at the level of the foramen magnum suggesting high-grade stenosis or vessel occlusion at this site (series 5, image 3). Skull and upper cervical spine: No focal suspicious marrow lesion. MRI ORBITS FINDINGS Mild intermittent motion degradation. Orbits: Bilateral lens replacements. The globes are normal in size and contour. The extraocular muscles are symmetric and unremarkable. There is abnormal enhancement along the optic nerve sheaths bilaterally, compatible with bilateral optic nerve perineuritis. No intraorbital mass identified. Visualized sinuses: Trace mucosal thickening within the bilateral ethmoid air cells. Soft tissues: The visualized maxillofacial and upper  neck soft tissues are unremarkable. IMPRESSION: MRI brain: 1. Mildly motion degraded exam. 2. Two subcentimeter acute infarcts within the inferior right cerebellar hemisphere. 3. Signal abnormality within the right vertebral artery at the level of the foramen magnum, suggesting high-grade stenosis or vessel occlusion at this site. 4. Chronic  lacunar infarcts within the left cerebral hemispheric white matter, bilateral basal ganglia, and bilateral thalami. Background moderate chronic small vessel ischemic changes within the cerebral white matter, and within the pons. 5. Mild generalized parenchymal atrophy. MRI orbits: 1. Mildly motion degraded exam. 2. Abnormal enhancement along the optic nerve sheaths bilaterally, compatible with bilateral optic nerve perineuritis. 3. Minimal mucosal thickening within the bilateral ethmoid air cells. Electronically Signed: By: Kellie Simmering D.O. On: 11/08/2020 12:31    Labs:  Basic Metabolic Panel: Recent Labs  Lab 11/18/20 1433 11/21/20 0603  NA  --  139  K  --  3.7  CL  --  105  CO2  --  27  GLUCOSE  --  87  BUN  --  21  CREATININE 0.94 0.79  CALCIUM  --  8.7*    CBC: Recent Labs  Lab 11/18/20 1433 11/21/20 0603  WBC 8.8 8.8  NEUTROABS  --  6.1  HGB 11.3* 10.8*  HCT 35.9* 33.1*  MCV 89.1 87.1  PLT 319 278    CBG: Recent Labs  Lab 11/23/20 2018 11/24/20 0616 11/24/20 1202 11/24/20 1710 11/24/20 2112  GLUCAP 178* 70 185* 231* 229*   Family history.  Positive for hypertension hyperlipidemia as well as diabetes mellitus.  Denies any colon cancer esophageal cancer or rectal cancer  Brief HPI:   Jenna Long is a 86 y.o. right-handed female with history of hypertension hyperlipidemia diabetes mellitus as well as left hip hemiarthroplasty 02/18/2020 after a fall.  Patient lives alone independent with assistive device.  He has a granddaughter with good support.  Presented 11/07/2020 with vision loss on the right side.  Denied any headache but did have some temporal discomfort x2 weeks since her sinuses were giving her problems.  CT/MRI showed 2 subcentimeter acute infarcts within the right inferior cerebellar hemisphere.  Signal abnormality within the right vertebral artery at the level of the foramen magnum suggesting high-grade stenosis or vessel occlusion at that site.  Chronic  lacunar infarcts within the left cerebral hemispheric white matter bilateral basal ganglia and bilateral thalami.  MRI orbits abnormal enhancement along the optic nerve sheaths bilaterally, compatible with bilateral optic nerve perineuritis.  CT angiogram head and neck severe stenosis of the mid and distal basilar artery and bilateral P2 PCAs.  Severe stenosis of distal M1 and proximal M2 MCA branches bilaterally.  Severe stenosis of the left A2 ACA.  Multifocal severe stenosis of the right vertebral artery in the neck with occlusion at the C2-3 level with irregular opacification of a small V3/V4 vertebral artery from collaterals.  Bilateral carotid bifurcation atherosclerosis without greater than 50% stenosis.  Echocardiogram with ejection fraction of 70 to 75% no wall motion abnormalities grade 1 diastolic dysfunction.  Her admission chemistries were unremarkable except glucose 205 sedimentation rate 111 C-reactive protein 8.6.  Neurology follow-up concern for temporal arteritis with right temporal artery biopsy completed 11/09/2020.  She did complete a 3-day course of Solu-Medrol started on prednisone 60 mg daily.  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  Plan is for very slow taper of prednisone decreased to 10 mg every 2 weeks follow-up rheumatology as well as neurology.  Patient did have an  episode of orthostatic hypotension while standing at the sink 11/11/2020 dropping to 97 and monitored.  Currently maintained on aspirin and Plavix x3 weeks and aspirin alone.  Lovenox for DVT prophylaxis.  Due to patient decreased functional mobility she was admitted for a comprehensive rehab program.   Hospital Course: Jenna Long was admitted to rehab 11/18/2020 for inpatient therapies to consist of PT, ST and OT at least three hours five days a week. Past admission physiatrist, therapy team and rehab RN have worked together to provide customized collaborative inpatient rehab.  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  She remained on aspirin and Plavix x3 weeks and aspirin alone with follow-up neurology services.  In regards to her temporal arteritis prednisone therapy with slow taper as advised outpatient rheumatology referral as well as neurology.  Subcutaneous Lovenox for DVT prophylaxis no bleeding episodes.  Blood pressure controlled on Norvasc's as well as Cozaar.  Blood sugars monitored hemoglobin A1c 6.3 she was currently on insulin therapy and monitored while on prednisone.  Lipitor ongoing for hyperlipidemia.   Blood pressures were monitored on TID basis and controlled  Diabetes has been monitored with ac/hs CBG checks and SSI was use prn for tighter BS control.    Rehab course: During patient's stay in rehab weekly team conferences were held to monitor patient's progress, set goals and discuss barriers to discharge. At admission, patient required min mod assist 20 feet rolling walker min mod assist sit to stand supervision supine to sit  Physical exam.  Blood pressure 158/76 pulse 57 temperature 97.6 respirations 18 oxygen saturations 100% Constitutional.  No acute distress HEENT Head.  Normocephalic and atraumatic Eyes.  No discharge EOMs intact patient with complete vision loss on the right Neck.  Supple nontender no JVD without thyromegaly Cardiac regular rate rhythm any extra sounds or murmur heard Abdomen.  Soft nontender positive bowel sounds without rebound Respiratory effort normal no respiratory distress without wheeze Skin.  Warm and dry Neurologic.  Alert oriented x3 follows commands. Motor.  Appears to be 4+/5 throughout however some difficulty following commands  He/She  has had improvement in activity tolerance, balance, postural control as well as ability to compensate for deficits. He/She has had improvement in functional use RUE/LUE  and RLE/LLE as well as improvement in  awareness.  Supine to sit with supervision.  Stand pivot transfers to wheelchair minimal assist.  Performs aspect of Berg to work on standing balance.  Multiple reps of sit to stand light min assist.  Ambulatory transfers to the bathroom.  Supervision for toileting task.  Ambulates to therapy gym with rolling walker contact-guard assist.  Full family teaching completed plan discharged to home       Disposition: Discharged to home   Diet: Diabetic diet  Special Instructions: No driving smoking or alcohol  Ambulatory referral obtained with rheumatology services  Prednisone taper by 10 mg every 2 weeks  Medications at discharge 1.  Tylenol as needed 2.  Norvasc 10 mg p.o. daily 3.  Aspirin 81 mg p.o. daily 4.  Lipitor 40 mg p.o. daily 5.  Plavix 75 mg daily x5 days and stop 6.  Colace 100 mg p.o. twice daily 7.  Trusopt ophthalmic solution 1 drop both eyes twice daily 8.  Hydrocodone 1 to 2 tablets every 4 hours as needed pain 9.  Cozaar 25 mg p.o. daily 10.  Prednisone 50 mg daily x2 weeks decreasing by 10 mg every  2 weeks until completed 11.  Semglee 12 units daily  30-35 minutes was spent completing discharge summary and discharge planning  Discharge Instructions     Ambulatory referral to Neurology   Complete by: As directed    An appointment is requested in approximately: 4 weeks temporal arteritis   Ambulatory referral to Physical Medicine Rehab   Complete by: As directed    Moderate complexity follow up 1-2 weeks temporal arteritis   Ambulatory referral to Rheumatology   Complete by: As directed    Evaluation and treat temporal arteritis        Follow-up Information     Sae Handrich, Luanna Salk, MD Follow up.   Specialty: Physical Medicine and Rehabilitation Why: Office to call for appointment Contact information: [place] [address] [phone]                 Signed: Cathlyn Parsons 11/25/2020, 5:24 AM

## 2020-11-24 NOTE — Evaluation (Signed)
Recreational Therapy Assessment and Plan  Patient Details  Name: Jenna Long MRN: [id number] Date of Birth: 04/01/1934 Today's Date: 11/24/2020  Rehab Potential:  Good ELOS:   d/c 10/8  Assessment Hospital Problem: Principal Problem:   Temporal arteritis ([place]) Active Problems:   H/O temporal arteritis     Past Medical History:      Past Medical History:  Diagnosis Date   Diabetes mellitus type II     DNR (do not resuscitate) 11/08/2020   Hypercholesteremia     Hypertension      Past Surgical History:       Past Surgical History:  Procedure Laterality Date   ABDOMINAL HYSTERECTOMY   1991    [facility], [place]    left, [place]   ARTERY BIOPSY Right 11/09/2020    Procedure: BIOPSY TEMPORAL ARTERY;  Surgeon: Cherre Robins, MD;  Location: [facility];  Service: Vascular;  Laterality: Right;   CATARACT EXTRACTION W/ INTRAOCULAR LENS IMPLANT   06/10/06     Right, [facility], [place]   CATARACT EXTRACTION W/PHACO   09/18/2010    Procedure: CATARACT EXTRACTION PHACO AND INTRAOCULAR LENS PLACEMENT (IOC);  Surgeon: Williams Che;  Location: [facility];  Service: Ophthalmology;  Laterality: Left;   COLONOSCOPY W/ POLYPECTOMY   06/20/10    [facility], [place]   HIP ARTHROPLASTY Left 02/18/2020    Procedure: ARTHROPLASTY BIPOLAR HIP (HEMIARTHROPLASTY);  Surgeon: Mordecai Rasmussen, MD;  Location: [facility];  Service: Orthopedics;  Laterality: Left;      Assessment & Plan Clinical Impression: Pt is 86 y/o female presented to ED on 9/20 for R vision loss. Was being sent to opthamologist, however office was closed prior to arrival and decided to come to ED. 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 MRI orbits revelaed abnormal enhancement along optic nerve sheaths bilaterally, compatible with bilateral optic nerve perineuritis. 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 PMH: HTN, HLD, DM    Pt  presents with decreased activity tolerance, decreased functional mobility, decreased balance, decreased coordination, decreased vision, decreased attention, decreased awareness, decreased problem solving, decreased safety awareness, decreased memory Limiting pt's independence with leisure/community pursuits.  Pt referred by team for participation in kitchen/home safety group. Pt participated in group education/discussion today with an emphasis on reducing fall risks within the home, specifically the kitchen, energy conservation techniques and home safety.  Extensive education on how to safely transport items, how set  up kitchen space for ease of use and safety precautions.  Pt offered suggestions to other pts in the group as well.  Handout provided.  Plan No further TR  Recommendations for other services: None   Discharge Criteria: Patient will be discharged from TR if patient refuses treatment 3 consecutive times without medical reason.  If treatment goals not met, if there is a change in medical status, if patient makes no progress towards goals or if patient is discharged from hospital.  The above assessment, treatment plan, treatment alternatives and goals were discussed and mutually agreed upon: by patient  [place] 11/24/2020, 4:15 PM

## 2020-11-25 LAB — GLUCOSE, CAPILLARY
Glucose-Capillary: 75 mg/dL (ref 70–99)
Glucose-Capillary: 199 mg/dL — ABNORMAL HIGH (ref 70–99)
Glucose-Capillary: 248 mg/dL — ABNORMAL HIGH (ref 70–99)
Glucose-Capillary: 182 mg/dL — ABNORMAL HIGH (ref 70–99)

## 2020-11-25 LAB — CREATININE, SERUM
Creatinine, Ser: 0.82 mg/dL (ref 0.44–1.00)
GFR, Estimated: >60 mL/min (ref >60)

## 2020-11-25 MED ORDER — INSULIN GLARGINE-YFGN 100 UNIT/ML ~~LOC~~ SOLN
14.0000 [IU] | Freq: Every day | SUBCUTANEOUS | Status: DC
  Administered 2020-11-26: 14 [IU] via SUBCUTANEOUS
  Filled 2020-11-25: qty 0.14

## 2020-11-25 NOTE — Progress Notes (Signed)
Occupational Therapy Session Note  Patient Details  Name: Jenna Long MRN: [id number] Date of Birth: [birth year]  Today's Date: 11/25/2020 OT Individual Time: 2951-8841 OT Individual Time Calculation (min): 75 min + 38 min   Short Term Goals: Week 1:  OT Short Term Goal 1 (Week 1): STG = LTG d/t ELOS  Skilled Therapeutic Interventions/Progress Updates:    Session 1 (6606-3016): Pt received semi-reclined in bed, denies pain, agreeable to therapy. Session focus on self-care retraining, activity tolerance, R visual scanning, dynamic standing balance, IADL retraining in prep for improved ADL/IADL/func mobility performance + decreased caregiver burden.  Completed bed mobility with mod I (increased time). Donned B shoes with S and increased time. Amb to sink with RW + S for balance, increased time to power up. Completed full-body sponge bathing with S in standing. Changed brief with S and use of reacher, noted to be incontinent of bladder, req prompting to change brief due to incontinence. Amb transfer to bathroom and completed 3/3 toilet tasks post continent void of bladder, as well with RW + S.   Pt able to gather her personal items and sort into bags in prep for DC tomorrow. Able to retrieve items from knee and shoulder height, and elected to sit down to fold clothing,  Ambulated throughout session with S and RW with intermittent rest breaks prompted by therapist. Pt reports feeling "winded" but "not that bad."   Pt gathered 12x2 cones from shoulder and knee heights placed around gym and busy hospital environment in hallway. Req min cues to locate all cones and no additional cuing needed for R visual scanning.  Finally, standing at [facility] to target activity tolerance and R visual scanning, static standing balance, completed 1 round of single target game with 51.26% accuracy, 1.95 RT. Min cues to locate targets on R.  Pt left seated in recliner with safety belt alarm engaged, call bell in reach,  and all immediate needs met.   Session 2 [id number]): Pt received supine in bed, denies pain, agreeable to therapy. Session focus on self-care retraining, activity tolerance, dynamic standing balance, IADL retraining, community reintegration in prep for improved ADL/IADL/func mobility performance + decreased caregiver burden. Completed bed mobility mod I (increased time). Donned B shoes with S. Amb to and from toilet with RW + S, toileting tasks with S post continent void. Small episode of anterior LOB, but pt able to self-correct without cues.  W/c transport to and from hospital atrium. Given verbal list of 5 "shopping list" items to locate in store x2. Pt required total A to locate 3/5 items, and assist to recall all 5/5 items. S for balance throughout with RW. Practiced ambulating through lobby tables/chairs to target safety awareness, dynamic standing balance, and activity tolerance in prep for return to community IADL. S for balance throughout.   Ambulatory transfer back to bed same manner as before, able to ind locate room once back on rehab unit. Returned to supine mod I. Pt with no questions prior to DC, reports daughter will assist with medication management.   Pt left supine in bed with bed alarm engaged, call bell in reach, and all immediate needs met.     Therapy Documentation Precautions:  Precautions Precautions: Fall, Other (comment) Precaution Comments: h/o orthostatic hypotension and syncope during admission Restrictions Weight Bearing Restrictions: No  Pain: denies   ADL: See Care Tool for more details.   Therapy/Group: Individual Therapy  Volanda Napoleon MS, OTR/L  11/25/2020, 6:38 AM

## 2020-11-25 NOTE — Plan of Care (Signed)
  Problem: RH Balance Goal: LTG: Patient will maintain dynamic sitting balance (OT) Description: LTG:  Patient will maintain dynamic sitting balance with assistance during activities of daily living (OT) Outcome: Completed/Met Goal: LTG Patient will maintain dynamic standing with ADLs (OT) Description: LTG:  Patient will maintain dynamic standing balance with assist during activities of daily living (OT)  Outcome: Completed/Met   Problem: RH Grooming Goal: LTG Patient will perform grooming w/assist,cues/equip (OT) Description: LTG: Patient will perform grooming with assist, with/without cues using equipment (OT) Outcome: Completed/Met   Problem: RH Bathing Goal: LTG Patient will bathe all body parts with assist levels (OT) Description: LTG: Patient will bathe all body parts with assist levels (OT) Outcome: Completed/Met   Problem: RH Dressing Goal: LTG Patient will perform upper body dressing (OT) Description: LTG Patient will perform upper body dressing with assist, with/without cues (OT). Outcome: Completed/Met Goal: LTG Patient will perform lower body dressing w/assist (OT) Description: LTG: Patient will perform lower body dressing with assist, with/without cues in positioning using equipment (OT) Outcome: Completed/Met   Problem: RH Toileting Goal: LTG Patient will perform toileting task (3/3 steps) with assistance level (OT) Description: LTG: Patient will perform toileting task (3/3 steps) with assistance level (OT)  Outcome: Completed/Met   Problem: RH Functional Use of Upper Extremity Goal: LTG Patient will use RT/LT upper extremity as a (OT) Description: LTG: Patient will use right/left upper extremity as a stabilizer/gross assist/diminished/nondominant/dominant level with assist, with/without cues during functional activity (OT) Outcome: Completed/Met   Problem: RH Simple Meal Prep Goal: LTG Patient will perform simple meal prep w/assist (OT) Description: LTG: Patient  will perform simple meal prep with assistance, with/without cues (OT). Outcome: Completed/Met   Problem: RH Laundry Goal: LTG Patient will perform laundry w/assist, cues (OT) Description: LTG: Patient will perform laundry with assistance, with/without cues (OT). Outcome: Completed/Met   Problem: RH Light Housekeeping Goal: LTG Patient will perform light housekeeping w/assist (OT) Description: LTG: Patient will perform light housekeeping with assistance, with/without cues (OT). Outcome: Completed/Met   Problem: RH Toilet Transfers Goal: LTG Patient will perform toilet transfers w/assist (OT) Description: LTG: Patient will perform toilet transfers with assist, with/without cues using equipment (OT) Outcome: Completed/Met   Problem: RH Memory Goal: LTG Patient will demonstrate ability for day to day recall/carry over during activities of daily living with assistance level (OT) Description: LTG:  Patient will demonstrate ability for day to day recall/carry over during activities of daily living with assistance level (OT). Outcome: Completed/Met   Problem: RH Awareness Goal: LTG: Patient will demonstrate awareness during functional activites type of (OT) Description: LTG: Patient will demonstrate awareness during functional activites type of (OT) Outcome: Completed/Met   Problem: RH Furniture Transfers Goal: LTG Patient will perform furniture transfers w/assist (OT/PT) Description: LTG: Patient will perform furniture transfers  with assistance (OT/PT). Outcome: Completed/Met

## 2020-11-25 NOTE — Progress Notes (Signed)
Physical Therapy Session Note  Patient Details  Name: Jenna Long MRN: [id number] Date of Birth: 1934/07/27  Today's Date: 11/25/2020 PT Individual Time: 1011-1036 PT Individual Time Calculation (min): 25 min   Short Term Goals: Week 1:  PT Short Term Goal 1 (Week 1): STG=LTG due to ELOS   Skilled Therapeutic Interventions/Progress Updates:  Patient supine in bed on entrance to room. Patient alert and agreeable to PT session.   Patient with no pain complaint throughout session.  Therapeutic Activity: Bed Mobility: Patient performed supine <> sit with Mod I using bedrail. No cues required.  Transfers: Patient performed sit<>stand and stand pivot transfers throughout session with supervision. Provided verbal cues following education for safe brake use on rollator prior to performing transfers. Pt improves to distant supervision using rollator throughout session.  Gait Training:  Patient ambulated >300 ft using rollator with close supervision. Travelled up/ down ramp with good control of rollator and used brakes prn to assist with control of speed. Provided vc/ tc for decreasing downward pressure into walker, and scanning entire visual field.   Patient seated upright  in recliner at end of session with brakes locked, belt alarm set, and all needs within reach.    Therapy Documentation Precautions:  Precautions Precautions: Fall Precaution Comments: h/o orthostatic hypotension and syncope during admission Restrictions Weight Bearing Restrictions: No  Pain:  No pain complaint throughout session.  Therapy/Group: Individual Therapy  Alger Simons PT, DPT 11/25/2020, 3:59 PM

## 2020-11-25 NOTE — Progress Notes (Signed)
Physical Therapy Session Note  Patient Details  Name: Jenna Long MRN: [id number] Date of Birth: May 10, 1934  Today's Date: 11/25/2020 PT Individual Time: 3419-6222 PT Individual Time Calculation (min): 68 min   Short Term Goals: Week 1:  PT Short Term Goal 1 (Week 1): STG=LTG due to ELOS   Skilled Therapeutic Interventions/Progress Updates:   Pt received sitting in WC and agreeable to PT. Gait training with Rollator x 128f [id number] supervision assist overall with 3 instances of CGA with turning to prevent mild posterior LOB.   PT instructed pt in dynamic balance training to place and remove 12 ren./green clothes pins x 2 bouts while standing on blue wedge. Min assist initially progressing to supervision assist. Noted to have 1 UE supported on table throughout as well as min cues for use of ankle strategy to improve COM control.   Dynamic gait training with Rollator to perform figure 8 around cones 2 x 3 with supervision assist-CGA for safety in turns to to the R. Dynamic gait training then performed without AD to step over 6 bar weights on the floor x 2 bouts. Min assist throughout and cues for improved sequencing and step length on BLE.   Nustep reciprocal movement training x 4 min +3 min +2 min with short therapeutic rest breaks between bouts. Cues for full ROM and decreased speed to maximize endurance benefits over entire endurance training exercises.   Patient returned to room and performed stand pivot to recliner with supervision assist and Rollator. Pt left sitting in recliner with call bell in reach and all needs met.        Therapy Documentation Precautions:  Precautions Precautions: Fall Precaution Comments: h/o orthostatic hypotension and syncope during admission Restrictions Weight Bearing Restrictions: No  Pain: Pain Assessment Pain Scale: 0-10 Pain Score: 0-No pain    Therapy/Group: Individual Therapy  AusLorie Phenix/08/2020, 12:08 PM

## 2020-11-25 NOTE — Progress Notes (Signed)
PROGRESS NOTE   Subjective/Complaints: Pt up in chair. Excited about discharge tomorrow  ROS: Patient denies fever, rash, sore throat, blurred vision, nausea, vomiting, diarrhea, cough, shortness of breath or chest pain, joint or back pain, headache, or mood change.   Objective:   No results found. No results for input(s): WBC, HGB, HCT, PLT in the last 72 hours. Recent Labs    11/25/20 0513  CREATININE 0.82    Intake/Output Summary (Last 24 hours) at 11/25/2020 1100 Last data filed at 11/25/2020 0753 Gross per 24 hour  Intake 680 ml  Output --  Net 680 ml        Physical Exam: Vital Signs Blood pressure (!) 159/76, pulse 71, temperature 98.3 F (36.8 C), temperature source Oral, resp. rate 16, height 5\' 7"  (1.702 m), weight 59.4 kg, SpO2 98 %.   Constitutional: No distress . Vital signs reviewed. HEENT: NCAT, EOMI, oral membranes moist Neck: supple Cardiovascular: RRR without murmur. No JVD    Respiratory/Chest: CTA Bilaterally without wheezes or rales. Normal effort    GI/Abdomen: BS +, non-tender, non-distended Ext: no clubbing, cyanosis, or edema Psych: pleasant and cooperative   Skin: right temporal wound cdi Neurologic: Alert and oriented x 3. Normal insight and awareness. Intact Memory. Normal language and speech. Cranial nerve exam unremarkable, motor strength is 5/5 in bilateral deltoid, bicep, tricep, grip, hip flexor, knee extensors, ankle dorsiflexor and plantar flexor Normal sensation and cerebellar  Musculoskeletal: Full ROM, No pain with AROM or PROM in the neck, trunk, or extremities. Posture appropriate     Assessment/Plan: 1. Functional deficits which require 3+ hours per day of interdisciplinary therapy in a comprehensive inpatient rehab setting. Physiatrist is providing close team supervision and 24 hour management of active medical problems listed below. Physiatrist and rehab team continue  to assess barriers to discharge/monitor patient progress toward functional and medical goals  Care Tool:  Bathing    Body parts bathed by patient: Right arm, Left arm, Chest, Abdomen, Front perineal area, Buttocks, Right upper leg, Left upper leg, Face, Right lower leg, Left lower leg   Body parts bathed by helper: Left lower leg, Right lower leg     Bathing assist Assist Level: Contact Guard/Touching assist     Upper Body Dressing/Undressing Upper body dressing   What is the patient wearing?: Pull over shirt    Upper body assist Assist Level: Independent    Lower Body Dressing/Undressing Lower body dressing      What is the patient wearing?: Pants     Lower body assist Assist for lower body dressing: Supervision/Verbal cueing     Toileting Toileting    Toileting assist Assist for toileting: Minimal Assistance - Patient > 75%     Transfers Chair/bed transfer  Transfers assist     Chair/bed transfer assist level: Contact Guard/Touching assist     Locomotion Ambulation   Ambulation assist      Assist level: Contact Guard/Touching assist Assistive device: Walker-rolling Max distance: 200   Walk 10 feet activity   Assist     Assist level: Contact Guard/Touching assist Assistive device: Walker-rolling   Walk 50 feet activity   Assist Walk 50 feet with  2 turns activity did not occur: Safety/medical concerns  Assist level: Contact Guard/Touching assist Assistive device: Walker-rolling    Walk 150 feet activity   Assist Walk 150 feet activity did not occur: Safety/medical concerns  Assist level: Contact Guard/Touching assist Assistive device: Walker-rolling    Walk 10 feet on uneven surface  activity   Assist Walk 10 feet on uneven surfaces activity did not occur: Safety/medical concerns         Wheelchair     Assist Is the patient using a wheelchair?: Yes (per PT note) Type of Wheelchair: Manual    Wheelchair assist  level: Minimal Assistance - Patient > 75% Max wheelchair distance: 150    Wheelchair 50 feet with 2 turns activity    Assist        Assist Level: Minimal Assistance - Patient > 75%   Wheelchair 150 feet activity     Assist      Assist Level: Minimal Assistance - Patient > 75%   Blood pressure (!) 159/76, pulse 71, temperature 98.3 F (36.8 C), temperature source Oral, resp. rate 16, height 5\' 7"  (1.702 m), weight 59.4 kg, SpO2 98 %.  Medical Problem List and Plan: 1.  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   PREDNISONE TAPER BY 10 MG EVERY 2-WEEK AND FOLLOW UP NEUROLOGY/RHEUMATOLOGY Right cerebellar infarcts             -patient may shower             -ELOS/Goals: 10/8          -Continue CIR therapies including PT, OT    2.  Antithrombotics: -DVT/anticoagulation:  Pharmaceutical: Lovenox             -antiplatelet therapy: Aspirin 81 mg daily and Plavix 75 mg day x3 weeks then aspirin alone 3. Pain Management: Hydrocodone as needed 4. Mood: Provide emotional support             -antipsychotic agents: N/A 5. Neuropsych: This patient is capable of making decisions on her own behalf. 6. Skin/Wound Care: Routine skin checks 7. Fluids/Electrolytes/Nutrition: Routine in and outs                8.  Hypertension.  Norvasc 5 mg daily, Cozaar 25 mg daily.   Vitals:   11/24/20 1915 11/25/20 0443  BP: 128/67 (!) 159/76  Pulse: 67 71  Resp: 17 16  Temp: 98.4 F (36.9 C) 98.3 F (36.8 C)  SpO2: 99% 98%  10/7 improvement with increase of amlodipine to 10mg   --follow  9.  Hyperlipidemia for Lipitor 10.  Diabetes mellitus.  Hemoglobin A1c 6.3.  Semglee 8 units twice daily             Monitor with increased mobility CBG (last 3)  Recent Labs    11/24/20 1710 11/24/20 2112 11/25/20 0635  GLUCAP 231* 229* 75  Added hs snack with some  improvement  10/7 -sugars still high in PM. Coverage also leading to AM hypoglycemia   -increase am semglee to 14u    LOS: 7 days A FACE TO FACE EVALUATION WAS PERFORMED  Meredith Staggers 11/25/2020, 11:00 AM

## 2020-11-26 LAB — GLUCOSE, CAPILLARY: Glucose-Capillary: 93 mg/dL (ref 70–99)

## 2020-11-26 NOTE — Progress Notes (Signed)
Physical Therapy Discharge Summary  Patient Details  Name: Jenna Long MRN: [id number] Date of Birth: [birth year]  Today's Date: 11/26/2020       Patient has met 10 of 10 long term goals due to improved activity tolerance, improved balance, improved postural control, increased strength, ability to compensate for deficits, improved attention, improved awareness, and improved coordination.  Patient to discharge at an ambulatory level Supervision.   Patient's care partner is independent to provide the necessary physical assistance at discharge.  Reasons goals not met: All PT goals met   Recommendation:  Patient will benefit from ongoing skilled PT services in home health setting to continue to advance safe functional mobility, address ongoing impairments in balance, coordination, safety, and minimize fall risk.  Equipment: No equipment provided  Reasons for discharge: treatment goals met and discharge from hospital  Patient/family agrees with progress made and goals achieved: Yes  PT Discharge    Pain Interference Pain Interference Pain Effect on Sleep: 1. Rarely or not at all Pain Interference with Therapy Activities: 2. Occasionally Pain Interference with Day-to-Day Activities: 2. Occasionally Vision/Perception  Perception Perception: Impaired Inattention/Neglect: Does not attend to right visual field Praxis Praxis: Intact   Sensation Sensation Light Touch: Appears Intact Hot/Cold: Appears Intact Proprioception: Appears Intact Stereognosis: Appears Intact Coordination Gross Motor Movements are Fluid and Coordinated: No Fine Motor Movements are Fluid and Coordinated: No Coordination and Movement Description: RLE decreased smoothness- mild ataxia/dysmetria Finger Nose Finger Test: BUE decreased smoothness- mild ataxia/dysmetria R>L Motor  Motor Motor: Hemiplegia;Abnormal postural alignment and control Motor - Discharge Observations: mild R hemi  Mobility Bed  Mobility Bed Mobility: Supine to Sit;Sit to Supine Supine to Sit: Independent Sit to Supine: Independent Transfers Transfers: Sit to Stand;Stand to Sit;Stand Pivot Transfers Sit to Stand: Supervision/Verbal cueing Stand to Sit: Supervision/Verbal cueing Stand Pivot Transfers: Supervision/Verbal cueing Transfer (Assistive device): Rolling walker Locomotion  Gait Ambulation: Yes Gait Assistance: Supervision/Verbal cueing Gait Distance (Feet): 150 Feet Assistive device: Rolling walker;4-wheeled walker Gait Gait: Yes Gait Pattern: Impaired Gait Pattern: Narrow base of support;Ataxic;Lateral hip instability Stairs / Additional Locomotion Stairs: Yes Stairs Assistance: Supervision/Verbal cueing Stair Management Technique: Two rails Number of Stairs: 12 Height of Stairs: 6 (and 3) Wheelchair Mobility Wheelchair Mobility: Yes Wheelchair Assistance: Chartered loss adjuster: Both upper extremities Wheelchair Parts Management: Needs assistance Distance: 150  Trunk/Postural Assessment  Cervical Assessment Cervical Assessment: Exceptions to [facility] (forward head) Thoracic Assessment Thoracic Assessment: Exceptions to [facility] (rounded shoulders) Lumbar Assessment Lumbar Assessment: Exceptions to [facility] (post pelvic tilt) Postural Control Postural Control: Within Functional Limits  Balance Balance Balance Assessed: Yes Static Sitting Balance Static Sitting - Level of Assistance: 7: Independent Dynamic Sitting Balance Dynamic Sitting - Balance Support: Feet supported Dynamic Sitting - Level of Assistance: 7: Independent Static Standing Balance Static Standing - Balance Support: Bilateral upper extremity supported;During functional activity Static Standing - Level of Assistance: 5: Stand by assistance Dynamic Standing Balance Dynamic Standing - Balance Support: During functional activity;Bilateral upper extremity supported Dynamic Standing - Level of Assistance: 5:  Stand by assistance Extremity Assessment      RLE Assessment RLE Assessment: Within Functional Limits General Strength Comments: grossly 4+/5 LLE Assessment LLE Assessment: Exceptions to [facility] General Strength Comments: hx of L hip fx and repair. grossly  4/5    Lorie Phenix 11/26/2020, 1:50 PM

## 2020-11-26 NOTE — Progress Notes (Signed)
PROGRESS NOTE   Subjective/Complaints: Pt in good spirits. Ready to go home!  ROS: Patient denies fever, rash, sore throat, blurred vision, nausea, vomiting, diarrhea, cough, shortness of breath or chest pain, joint or back pain, headache, or mood change.   Objective:   No results found. No results for input(s): WBC, HGB, HCT, PLT in the last 72 hours. Recent Labs    11/25/20 0513  CREATININE 0.82    Intake/Output Summary (Last 24 hours) at 11/26/2020 0836 Last data filed at 11/26/2020 0730 Gross per 24 hour  Intake 630 ml  Output --  Net 630 ml        Physical Exam: Vital Signs Blood pressure (!) 158/69, pulse (!) 55, temperature 98.1 F (36.7 C), temperature source Oral, resp. rate 18, height 5\' 7"  (1.702 m), weight 59.1 kg, SpO2 97 %.   Constitutional: No distress . Vital signs reviewed. HEENT: NCAT, EOMI, oral membranes moist Neck: supple Cardiovascular: RRR without murmur. No JVD    Respiratory/Chest: CTA Bilaterally without wheezes or rales. Normal effort    GI/Abdomen: BS +, non-tender, non-distended Ext: no clubbing, cyanosis, or edema Psych: pleasant and cooperative  Skin: right temporal wound dry Neurologic: Alert and oriented x 3. Normal insight and awareness. Intact Memory. Normal language and speech. Cranial nerve exam unremarkable, motor strength is 5/5 in bilateral deltoid, bicep, tricep, grip, hip flexor, knee extensors, ankle dorsiflexor and plantar flexor Normal sensation and cerebellar  Musculoskeletal: Full ROM, No pain with AROM or PROM in the neck, trunk, or extremities. Posture appropriate     Assessment/Plan: 1. Functional deficits which require 3+ hours per day of interdisciplinary therapy in a comprehensive inpatient rehab setting. Physiatrist is providing close team supervision and 24 hour management of active medical problems listed below. Physiatrist and rehab team continue to  assess barriers to discharge/monitor patient progress toward functional and medical goals  Care Tool:  Bathing    Body parts bathed by patient: Right arm, Left arm, Chest, Abdomen, Front perineal area, Buttocks, Right upper leg, Left upper leg, Face, Right lower leg, Left lower leg   Body parts bathed by helper: Left lower leg, Right lower leg     Bathing assist Assist Level: Supervision/Verbal cueing     Upper Body Dressing/Undressing Upper body dressing   What is the patient wearing?: Pull over shirt    Upper body assist Assist Level: Independent    Lower Body Dressing/Undressing Lower body dressing      What is the patient wearing?: Pants, Underwear/pull up     Lower body assist Assist for lower body dressing: Supervision/Verbal cueing     Toileting Toileting    Toileting assist Assist for toileting: Supervision/Verbal cueing     Transfers Chair/bed transfer  Transfers assist     Chair/bed transfer assist level: Supervision/Verbal cueing     Locomotion Ambulation   Ambulation assist      Assist level: Supervision/Verbal cueing Assistive device: Walker-rolling Max distance: 150   Walk 10 feet activity   Assist     Assist level: Supervision/Verbal cueing Assistive device: Walker-rolling   Walk 50 feet activity   Assist Walk 50 feet with 2 turns activity did not occur:  Safety/medical concerns  Assist level: Supervision/Verbal cueing Assistive device: Walker-rolling    Walk 150 feet activity   Assist Walk 150 feet activity did not occur: Safety/medical concerns  Assist level: Supervision/Verbal cueing Assistive device: Walker-rolling    Walk 10 feet on uneven surface  activity   Assist Walk 10 feet on uneven surfaces activity did not occur: Safety/medical concerns   Assist level: Supervision/Verbal cueing Assistive device: Walker-rolling   Wheelchair     Assist Is the patient using a wheelchair?: No Type of Wheelchair:  Manual    Wheelchair assist level: Supervision/Verbal cueing Max wheelchair distance: 150    Wheelchair 50 feet with 2 turns activity    Assist        Assist Level: Supervision/Verbal cueing   Wheelchair 150 feet activity     Assist      Assist Level: Supervision/Verbal cueing   Blood pressure (!) 158/69, pulse (!) 55, temperature 98.1 F (36.7 C), temperature source Oral, resp. rate 18, height 5\' 7"  (1.702 m), weight 59.1 kg, SpO2 97 %.  Medical Problem List and Plan: 1.  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   PREDNISONE TAPER BY 10 MG EVERY 2-WEEK AND FOLLOW UP NEUROLOGY/RHEUMATOLOGY Right cerebellar infarcts             -dc home today. F/u with [facility]  2.  Antithrombotics: -DVT/anticoagulation:  Pharmaceutical: Lovenox--dc             -antiplatelet therapy: Aspirin 81 mg daily and Plavix 75 mg day x3 weeks then aspirin alone 3. Pain Management: Hydrocodone as needed 4. Mood: Provide emotional support             -antipsychotic agents: N/A 5. Neuropsych: This patient is capable of making decisions on her own behalf. 6. Skin/Wound Care: Routine skin checks 7. Fluids/Electrolytes/Nutrition: Routine in and outs                8.  Hypertension.  Norvasc 5 mg daily, Cozaar 25 mg daily.   Vitals:   11/25/20 1930 11/26/20 0323  BP: 127/64 (!) 158/69  Pulse: 69 (!) 55  Resp: 18 18  Temp: 98.3 F (36.8 C) 98.1 F (36.7 C)  SpO2: 98% 97%  10/8 improvement with increase of amlodipine to 10mg   --follow  9.  Hyperlipidemia for Lipitor 10.  Diabetes mellitus.  Hemoglobin A1c 6.3.  Semglee 8 units twice daily             Monitor with increased mobility CBG (last 3)  Recent Labs    11/25/20 1714 11/25/20 2117 11/26/20 0633  GLUCAP 248* 182* 93  Added hs snack with some improvement  10/8 -sugars still high in PM. Coverage also leading to  AM hypoglycemia   -increased am semglee to 14u effective today   -told patient that she will have to adjust insulin regimen based on steroid dose and cbg readings.     LOS: 8 days A FACE TO FACE EVALUATION WAS PERFORMED  Meredith Staggers 11/26/2020, 8:36 AM

## 2020-11-26 NOTE — Plan of Care (Signed)
  Problem: RH Balance Goal: LTG Patient will maintain dynamic sitting balance (PT) Description: LTG:  Patient will maintain dynamic sitting balance with assistance during mobility activities (PT) Outcome: Completed/Met Goal: LTG Patient will maintain dynamic standing balance (PT) Description: LTG:  Patient will maintain dynamic standing balance with assistance during mobility activities (PT) Outcome: Completed/Met   Problem: Sit to Stand Goal: LTG:  Patient will perform sit to stand with assistance level (PT) Description: LTG:  Patient will perform sit to stand with assistance level (PT) Outcome: Completed/Met   Problem: RH Bed Mobility Goal: LTG Patient will perform bed mobility with assist (PT) Description: LTG: Patient will perform bed mobility with assistance, with/without cues (PT). Outcome: Completed/Met   Problem: RH Bed to Chair Transfers Goal: LTG Patient will perform bed/chair transfers w/assist (PT) Description: LTG: Patient will perform bed to chair transfers with assistance (PT). Outcome: Completed/Met   Problem: RH Car Transfers Goal: LTG Patient will perform car transfers with assist (PT) Description: LTG: Patient will perform car transfers with assistance (PT). Outcome: Completed/Met   Problem: RH Ambulation Goal: LTG Patient will ambulate in controlled environment (PT) Description: LTG: Patient will ambulate in a controlled environment, # of feet with assistance (PT). Outcome: Completed/Met Goal: LTG Patient will ambulate in home environment (PT) Description: LTG: Patient will ambulate in home environment, # of feet with assistance (PT). Outcome: Completed/Met   Problem: RH Wheelchair Mobility Goal: LTG Patient will propel w/c in controlled environment (PT) Description: LTG: Patient will propel wheelchair in controlled environment, # of feet with assist (PT) Outcome: Completed/Met   Problem: RH Stairs Goal: LTG Patient will ambulate up and down stairs  w/assist (PT) Description: LTG: Patient will ambulate up and down # of stairs with assistance (PT) Outcome: Completed/Met

## 2020-11-29 DIAGNOSIS — M1991 Primary osteoarthritis, unspecified site: Secondary | ICD-10-CM | POA: Diagnosis not present

## 2020-11-29 DIAGNOSIS — Z6821 Body mass index (BMI) 21.0-21.9, adult: Secondary | ICD-10-CM | POA: Diagnosis not present

## 2020-11-29 DIAGNOSIS — I10 Essential (primary) hypertension: Secondary | ICD-10-CM | POA: Diagnosis not present

## 2020-11-29 DIAGNOSIS — M316 Other giant cell arteritis: Secondary | ICD-10-CM | POA: Diagnosis not present

## 2020-11-29 DIAGNOSIS — E663 Overweight: Secondary | ICD-10-CM | POA: Diagnosis not present

## 2020-11-29 DIAGNOSIS — I6930 Unspecified sequelae of cerebral infarction: Secondary | ICD-10-CM | POA: Diagnosis not present

## 2020-11-29 DIAGNOSIS — E1165 Type 2 diabetes mellitus with hyperglycemia: Secondary | ICD-10-CM | POA: Diagnosis not present

## 2020-12-05 DIAGNOSIS — H401121 Primary open-angle glaucoma, left eye, mild stage: Secondary | ICD-10-CM | POA: Diagnosis not present

## 2020-12-07 ENCOUNTER — Encounter: Payer: PPO | Attending: Registered Nurse | Admitting: Registered Nurse

## 2020-12-13 ENCOUNTER — Inpatient Hospital Stay (HOSPITAL_COMMUNITY)
Admission: EM | Admit: 2020-12-13 | Discharge: 2020-12-16 | DRG: 065 | Disposition: A | Discharge status: Home Health | Payer: PPO | Attending: Internal Medicine | Admitting: Internal Medicine

## 2020-12-13 ENCOUNTER — Encounter (HOSPITAL_COMMUNITY): Payer: Self-pay | Admitting: Emergency Medicine

## 2020-12-13 ENCOUNTER — Emergency Department (HOSPITAL_COMMUNITY): Payer: PPO

## 2020-12-13 DIAGNOSIS — H5462 Unqualified visual loss, left eye, normal vision right eye: Secondary | ICD-10-CM | POA: Diagnosis present

## 2020-12-13 DIAGNOSIS — G9340 Encephalopathy, unspecified: Secondary | ICD-10-CM | POA: Diagnosis present

## 2020-12-13 DIAGNOSIS — N3000 Acute cystitis without hematuria: Secondary | ICD-10-CM | POA: Diagnosis present

## 2020-12-13 DIAGNOSIS — G8191 Hemiplegia, unspecified affecting right dominant side: Secondary | ICD-10-CM | POA: Diagnosis not present

## 2020-12-13 DIAGNOSIS — Z7902 Long term (current) use of antithrombotics/antiplatelets: Secondary | ICD-10-CM | POA: Diagnosis not present

## 2020-12-13 DIAGNOSIS — T380X5A Adverse effect of glucocorticoids and synthetic analogues, initial encounter: Secondary | ICD-10-CM | POA: Diagnosis not present

## 2020-12-13 DIAGNOSIS — I63522 Cerebral infarction due to unspecified occlusion or stenosis of left anterior cerebral artery: Secondary | ICD-10-CM | POA: Diagnosis not present

## 2020-12-13 DIAGNOSIS — I110 Hypertensive heart disease with heart failure: Secondary | ICD-10-CM | POA: Diagnosis present

## 2020-12-13 DIAGNOSIS — E1165 Type 2 diabetes mellitus with hyperglycemia: Secondary | ICD-10-CM

## 2020-12-13 DIAGNOSIS — Z8673 Personal history of transient ischemic attack (TIA), and cerebral infarction without residual deficits: Secondary | ICD-10-CM | POA: Diagnosis not present

## 2020-12-13 DIAGNOSIS — I5032 Chronic diastolic (congestive) heart failure: Secondary | ICD-10-CM | POA: Diagnosis present

## 2020-12-13 DIAGNOSIS — Z79899 Other long term (current) drug therapy: Secondary | ICD-10-CM

## 2020-12-13 DIAGNOSIS — M316 Other giant cell arteritis: Secondary | ICD-10-CM | POA: Diagnosis present

## 2020-12-13 DIAGNOSIS — Z7982 Long term (current) use of aspirin: Secondary | ICD-10-CM | POA: Diagnosis not present

## 2020-12-13 DIAGNOSIS — Z66 Do not resuscitate: Secondary | ICD-10-CM | POA: Diagnosis not present

## 2020-12-13 DIAGNOSIS — R29704 NIHSS score 4: Secondary | ICD-10-CM | POA: Diagnosis present

## 2020-12-13 DIAGNOSIS — Z96642 Presence of left artificial hip joint: Secondary | ICD-10-CM | POA: Diagnosis present

## 2020-12-13 DIAGNOSIS — I5033 Acute on chronic diastolic (congestive) heart failure: Secondary | ICD-10-CM | POA: Diagnosis not present

## 2020-12-13 DIAGNOSIS — R531 Weakness: Secondary | ICD-10-CM | POA: Diagnosis not present

## 2020-12-13 DIAGNOSIS — I471 Supraventricular tachycardia: Secondary | ICD-10-CM | POA: Diagnosis not present

## 2020-12-13 DIAGNOSIS — Z794 Long term (current) use of insulin: Secondary | ICD-10-CM

## 2020-12-13 DIAGNOSIS — E782 Mixed hyperlipidemia: Secondary | ICD-10-CM | POA: Diagnosis not present

## 2020-12-13 DIAGNOSIS — R739 Hyperglycemia, unspecified: Principal | ICD-10-CM

## 2020-12-13 DIAGNOSIS — Z20822 Contact with and (suspected) exposure to covid-19: Secondary | ICD-10-CM | POA: Diagnosis not present

## 2020-12-13 DIAGNOSIS — E119 Type 2 diabetes mellitus without complications: Secondary | ICD-10-CM

## 2020-12-13 DIAGNOSIS — E7800 Pure hypercholesterolemia, unspecified: Secondary | ICD-10-CM | POA: Diagnosis present

## 2020-12-13 DIAGNOSIS — Z7952 Long term (current) use of systemic steroids: Secondary | ICD-10-CM

## 2020-12-13 DIAGNOSIS — I639 Cerebral infarction, unspecified: Secondary | ICD-10-CM

## 2020-12-13 DIAGNOSIS — E1169 Type 2 diabetes mellitus with other specified complication: Secondary | ICD-10-CM | POA: Diagnosis not present

## 2020-12-13 DIAGNOSIS — H5461 Unqualified visual loss, right eye, normal vision left eye: Secondary | ICD-10-CM | POA: Diagnosis not present

## 2020-12-13 DIAGNOSIS — I10 Essential (primary) hypertension: Secondary | ICD-10-CM | POA: Diagnosis present

## 2020-12-13 DIAGNOSIS — I63422 Cerebral infarction due to embolism of left anterior cerebral artery: Principal | ICD-10-CM | POA: Diagnosis present

## 2020-12-13 LAB — URINALYSIS, ROUTINE W REFLEX MICROSCOPIC
Bilirubin Urine: NEGATIVE
Glucose, UA: >=500 mg/dL — AB
Hgb urine dipstick: NEGATIVE
Ketones, ur: NEGATIVE mg/dL
Nitrite: POSITIVE — AB
Protein, ur: NEGATIVE mg/dL
Specific Gravity, Urine: 1.015 (ref 1.005–1.030)
WBC, UA: >50 WBC/hpf — ABNORMAL HIGH (ref 0–5)
pH: 5 (ref 5.0–8.0)

## 2020-12-13 LAB — I-STAT VENOUS BLOOD GAS, ED
Acid-Base Excess: 1 mmol/L (ref 0.0–2.0)
Bicarbonate: 23.5 mmol/L (ref 20.0–28.0)
Calcium, Ion: 1.1 mmol/L — ABNORMAL LOW (ref 1.15–1.40)
HCT: 36 % (ref 36.0–46.0)
Hemoglobin: 12.2 g/dL (ref 12.0–15.0)
O2 Saturation: 79 %
Potassium: 3.9 mmol/L (ref 3.5–5.1)
Sodium: 133 mmol/L — ABNORMAL LOW (ref 135–145)
TCO2: 25 mmol/L (ref 22–32)
pCO2, Ven: 32 mmHg — ABNORMAL LOW (ref 44.0–60.0)
pH, Ven: 7.474 — ABNORMAL HIGH (ref 7.250–7.430)
pO2, Ven: 40 mmHg (ref 32.0–45.0)

## 2020-12-13 LAB — I-STAT CHEM 8, ED
BUN: 17 mg/dL (ref 8–23)
Calcium, Ion: 1.1 mmol/L — ABNORMAL LOW (ref 1.15–1.40)
Chloride: 100 mmol/L (ref 98–111)
Creatinine, Ser: 0.8 mg/dL (ref 0.44–1.00)
Glucose, Bld: 439 mg/dL — ABNORMAL HIGH (ref 70–99)
HCT: 38 % (ref 36.0–46.0)
Hemoglobin: 12.9 g/dL (ref 12.0–15.0)
Potassium: 3.9 mmol/L (ref 3.5–5.1)
Sodium: 133 mmol/L — ABNORMAL LOW (ref 135–145)
TCO2: 22 mmol/L (ref 22–32)

## 2020-12-13 LAB — CBC WITH DIFFERENTIAL/PLATELET
Abs Immature Granulocytes: 0.08 10*3/uL — ABNORMAL HIGH (ref 0.00–0.07)
Basophils Absolute: 0 10*3/uL (ref 0.0–0.1)
Basophils Relative: 0 %
Eosinophils Absolute: 0 10*3/uL (ref 0.0–0.5)
Eosinophils Relative: 0 %
HCT: 38.4 % (ref 36.0–46.0)
Hemoglobin: 12.5 g/dL (ref 12.0–15.0)
Immature Granulocytes: 1 %
Lymphocytes Relative: 28 %
Lymphs Abs: 1.7 10*3/uL (ref 0.7–4.0)
MCH: 29.7 pg (ref 26.0–34.0)
MCHC: 32.6 g/dL (ref 30.0–36.0)
MCV: 91.2 fL (ref 80.0–100.0)
Monocytes Absolute: 0.6 10*3/uL (ref 0.1–1.0)
Monocytes Relative: 10 %
Neutro Abs: 3.6 10*3/uL (ref 1.7–7.7)
Neutrophils Relative %: 61 %
Platelets: 308 10*3/uL (ref 150–400)
RBC: 4.21 MIL/uL (ref 3.87–5.11)
RDW: 18.6 % — ABNORMAL HIGH (ref 11.5–15.5)
WBC: 6 10*3/uL (ref 4.0–10.5)
nRBC: 0 % (ref 0.0–0.2)

## 2020-12-13 LAB — RESP PANEL BY RT-PCR (FLU A&B, COVID) ARPGX2
Influenza A by PCR: NEGATIVE
Influenza B by PCR: NEGATIVE
SARS Coronavirus 2 by RT PCR: NEGATIVE

## 2020-12-13 LAB — ETHANOL: Alcohol, Ethyl (B): <10 mg/dL (ref <10)

## 2020-12-13 LAB — TROPONIN I (HIGH SENSITIVITY)
Troponin I (High Sensitivity): 8 ng/L (ref <18)
Troponin I (High Sensitivity): 8 ng/L (ref <18)

## 2020-12-13 LAB — COMPREHENSIVE METABOLIC PANEL
ALT: 22 U/L (ref 0–44)
AST: 15 U/L (ref 15–41)
Albumin: 3.3 g/dL — ABNORMAL LOW (ref 3.5–5.0)
Alkaline Phosphatase: 76 U/L (ref 38–126)
Anion gap: 11 (ref 5–15)
BUN: 17 mg/dL (ref 8–23)
CO2: 23 mmol/L (ref 22–32)
Calcium: 9.2 mg/dL (ref 8.9–10.3)
Chloride: 97 mmol/L — ABNORMAL LOW (ref 98–111)
Creatinine, Ser: 0.97 mg/dL (ref 0.44–1.00)
GFR, Estimated: 58 mL/min — ABNORMAL LOW (ref >60)
Glucose, Bld: 439 mg/dL — ABNORMAL HIGH (ref 70–99)
Potassium: 3.8 mmol/L (ref 3.5–5.1)
Sodium: 131 mmol/L — ABNORMAL LOW (ref 135–145)
Total Bilirubin: 0.8 mg/dL (ref 0.3–1.2)
Total Protein: 6.5 g/dL (ref 6.5–8.1)

## 2020-12-13 LAB — PROTIME-INR
INR: 1 (ref 0.8–1.2)
Prothrombin Time: 13.4 seconds (ref 11.4–15.2)

## 2020-12-13 LAB — RAPID URINE DRUG SCREEN, HOSP PERFORMED
Amphetamines: NOT DETECTED
Barbiturates: NOT DETECTED
Benzodiazepines: NOT DETECTED
Cocaine: NOT DETECTED
Opiates: NOT DETECTED
Tetrahydrocannabinol: NOT DETECTED

## 2020-12-13 LAB — CBG MONITORING, ED
Glucose-Capillary: 405 mg/dL — ABNORMAL HIGH (ref 70–99)
Glucose-Capillary: 326 mg/dL — ABNORMAL HIGH (ref 70–99)

## 2020-12-13 LAB — OSMOLALITY: Osmolality: 300 mOsm/kg — ABNORMAL HIGH (ref 275–295)

## 2020-12-13 LAB — MAGNESIUM: Magnesium: 2.1 mg/dL (ref 1.7–2.4)

## 2020-12-13 LAB — BETA-HYDROXYBUTYRIC ACID: Beta-Hydroxybutyric Acid: 0.12 mmol/L (ref 0.05–0.27)

## 2020-12-13 LAB — APTT: aPTT: 21 seconds — ABNORMAL LOW (ref 24–36)

## 2020-12-13 IMAGING — DX DG CHEST 2V
2 series · 2 of 2 positions shown · non-contrast
Comparison: Chest x-ray [DATE]

CLINICAL DATA: Weakness

EXAM:
CHEST - 2 VIEW

[chest lat]
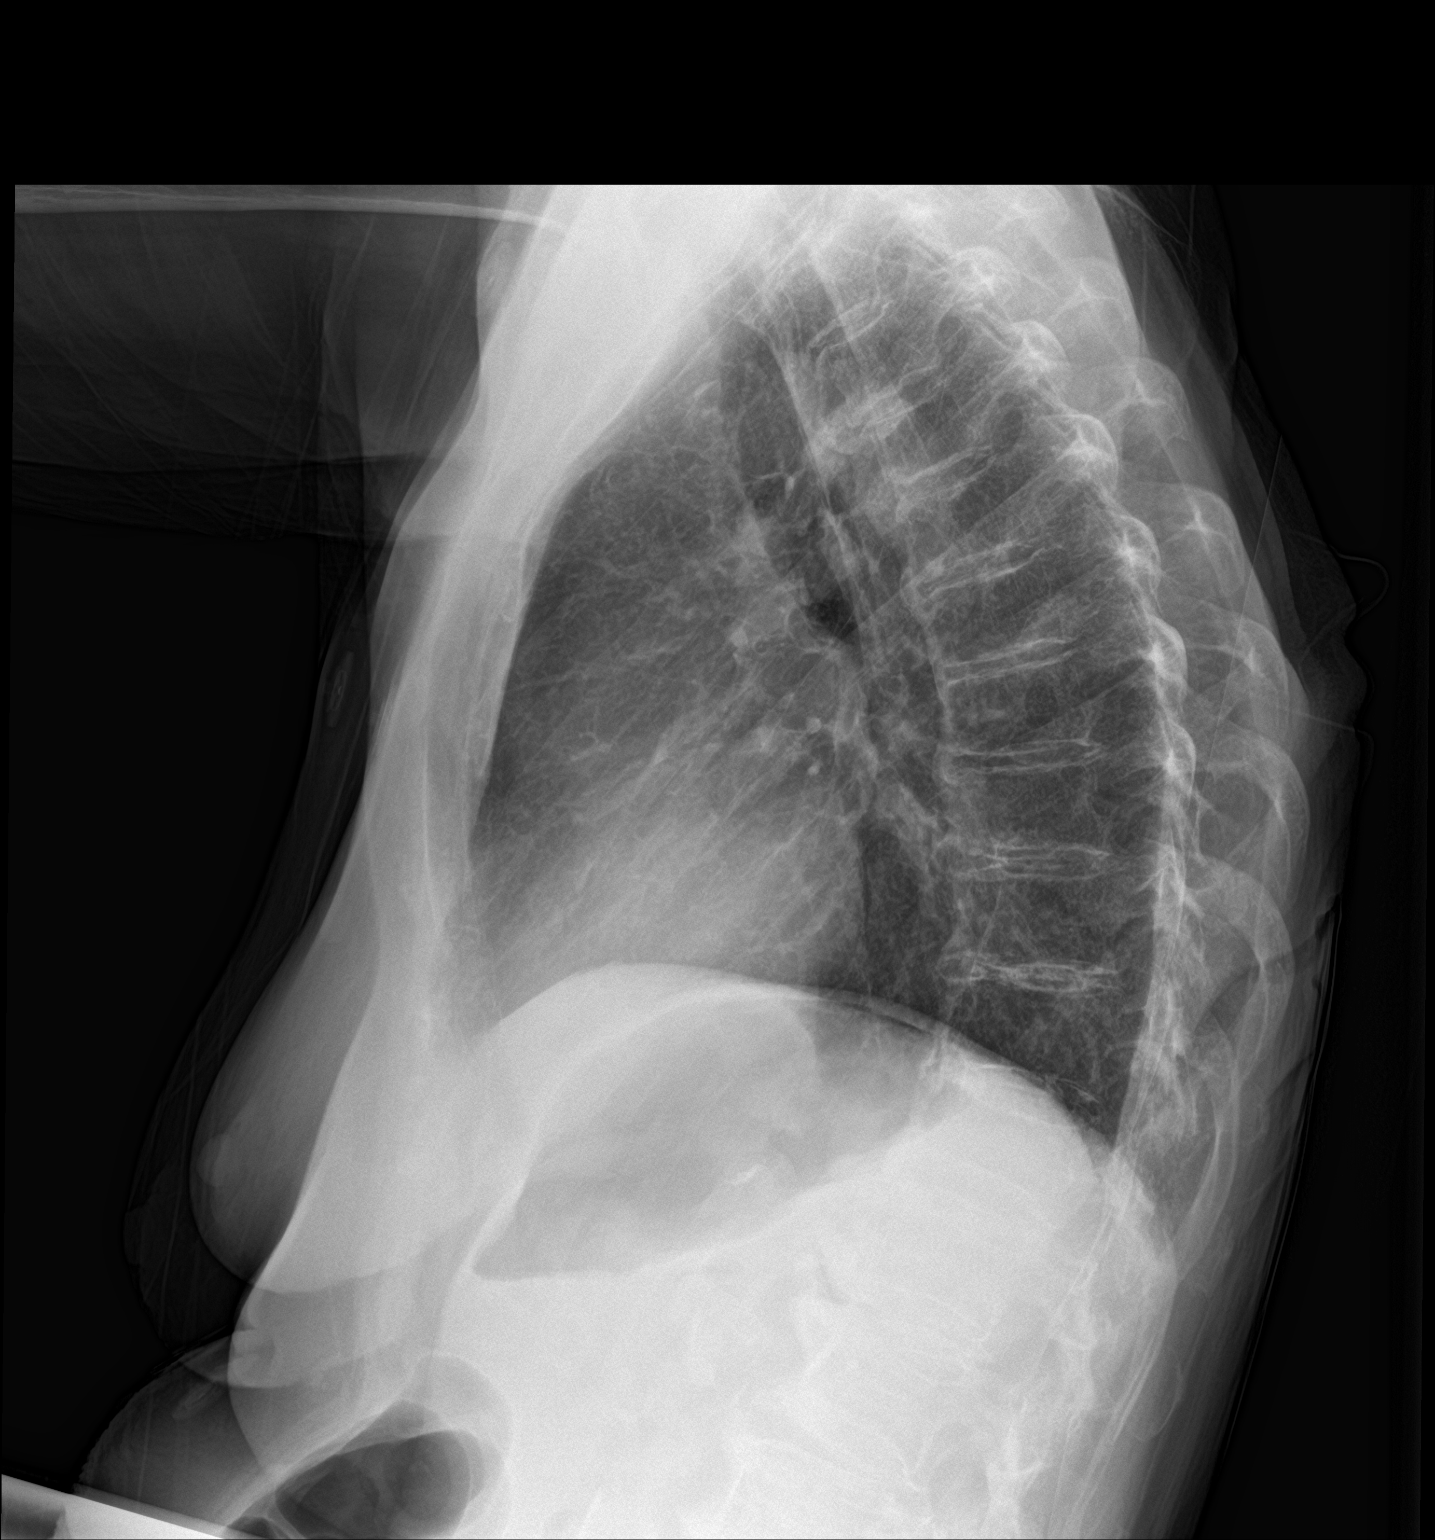

[chest ap]
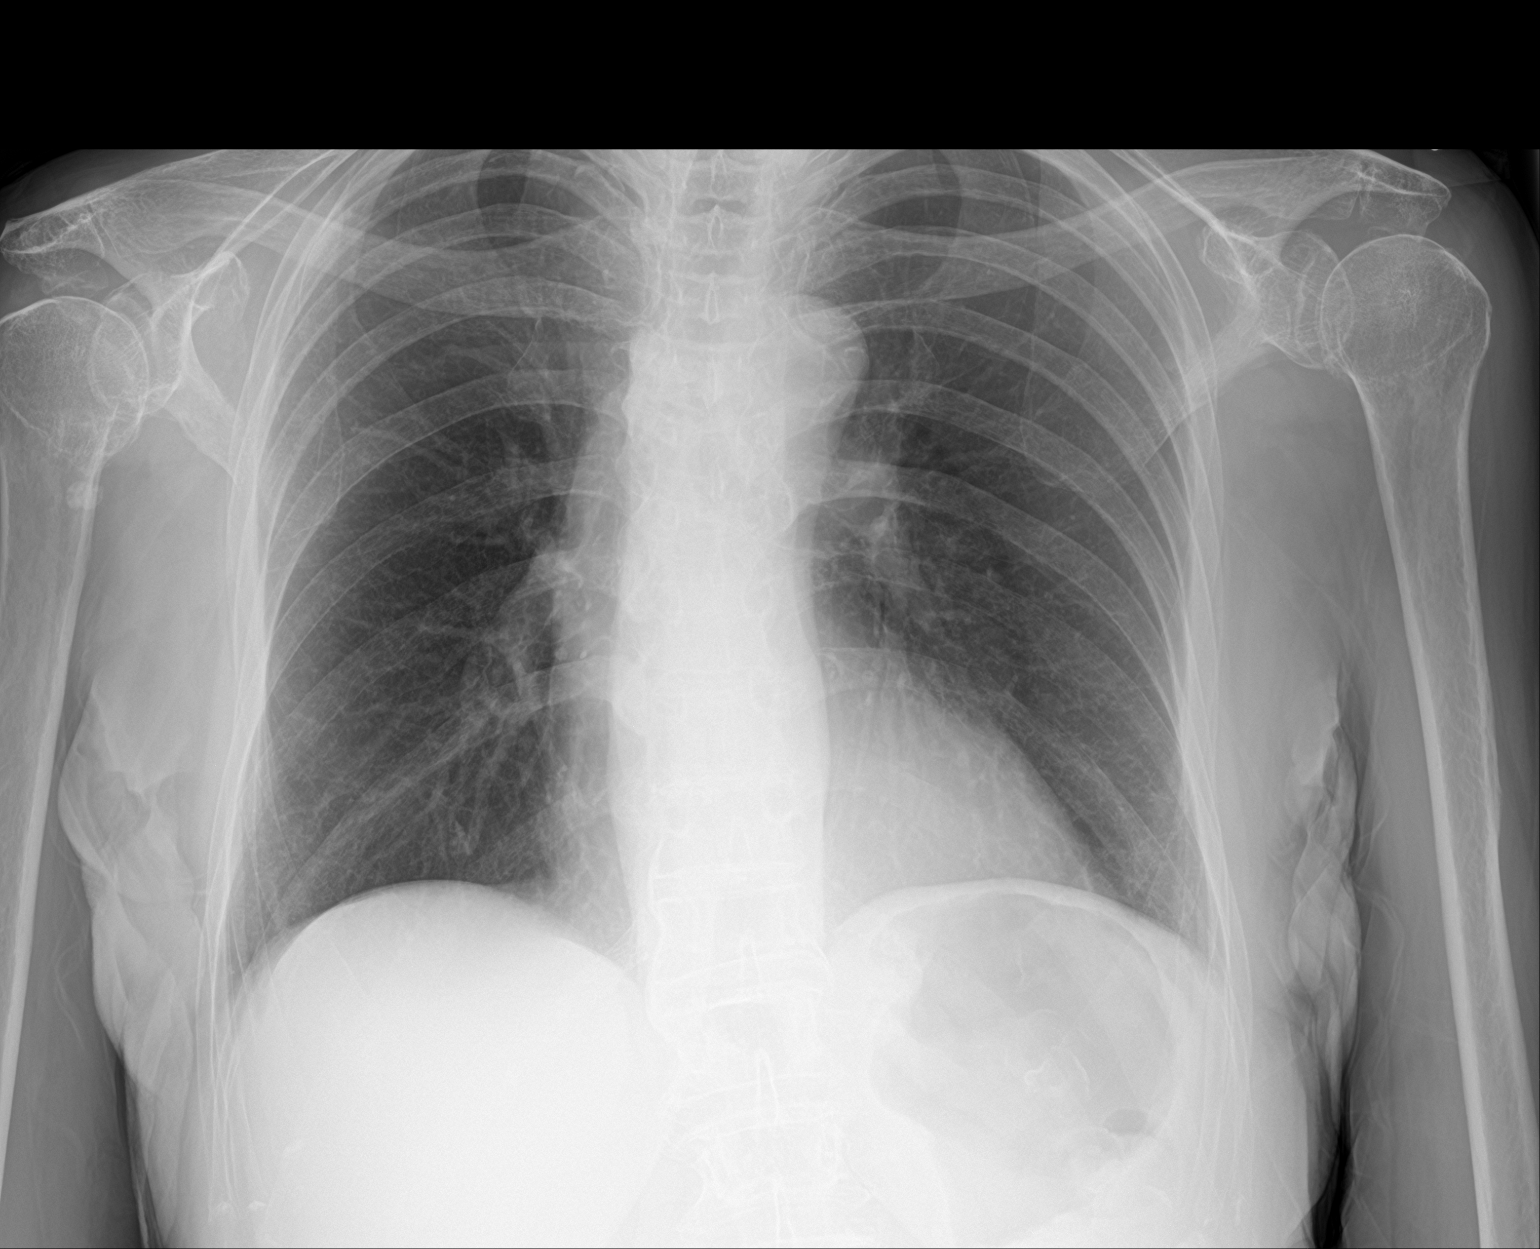

[2 of 2 positions shown; findings below may reference images not displayed]

FINDINGS: Heart size and mediastinum appear stable and within normal limits.
No focal consolidation identified. No pleural effusion or
pneumothorax. Bones are osteopenic.
IMPRESSION: No acute intrathoracic process identified.

## 2020-12-13 IMAGING — MR MR HEAD WO/W CM
9 of 13 series · 34 of 48 positions shown · IV contrast (gadavist)
Comparison: [DATE]

CLINICAL DATA: Neuro deficit, acute, stroke suspected vision
changes and confusion, hx of recent stroke and temporal arteritis

EXAM:
MRI HEAD WITHOUT AND WITH CONTRAST
TECHNIQUE: Multiplanar, multiecho pulse sequences of the brain and surrounding
structures were obtained without and with intravenous contrast.
CONTRAST:  6mL GADAVIST GADOBUTROL 1 MMOL/ML IV SOLN

[Series 3: DWI · axial · 3.0mm · 1.09mm/px · z∈[-77,+70]mm · 9 of 100 slices shown (1 of 4)]
[im 1/100]
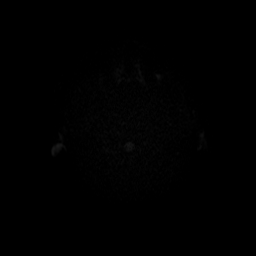
[im 13/100]
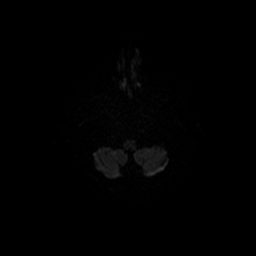
[im 25/100]
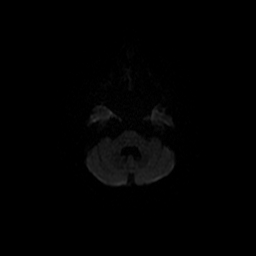
[im 38/100]
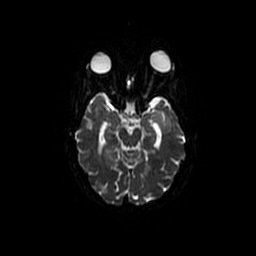
[im 50/100]
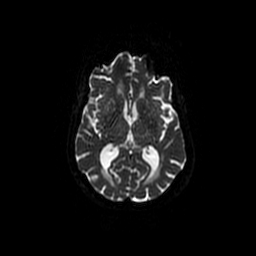
[im 62/100]
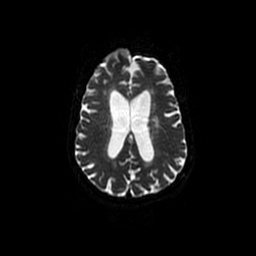
[im 75/100]
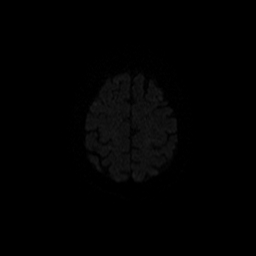
[im 87/100]
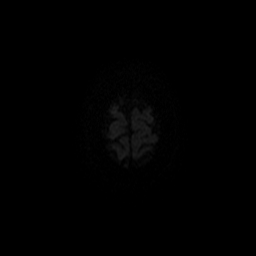
[im 100/100]
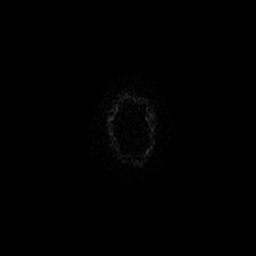

[Series 4: DWI · coronal · 5.0mm · 1.09mm/px · 6 of 70 slices shown (2 of 4)]
[im 1/70]
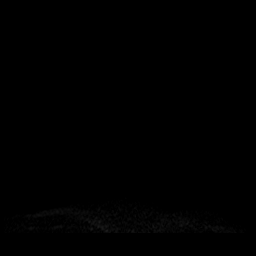
[im 14/70]
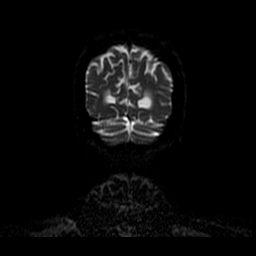
[im 28/70]
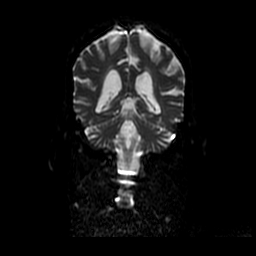
[im 42/70]
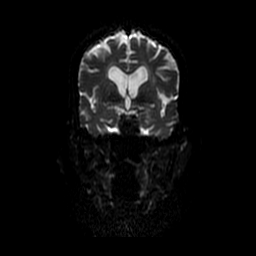
[im 56/70]
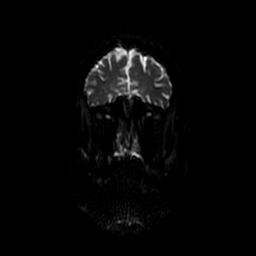
[im 70/70]
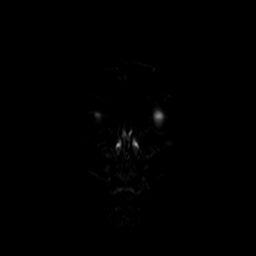

[Series 6: T2 · axial · 5.0mm · 0.43mm/px · z∈[-76,+68]mm · 2 of 25 slices shown]
[im 1/25]
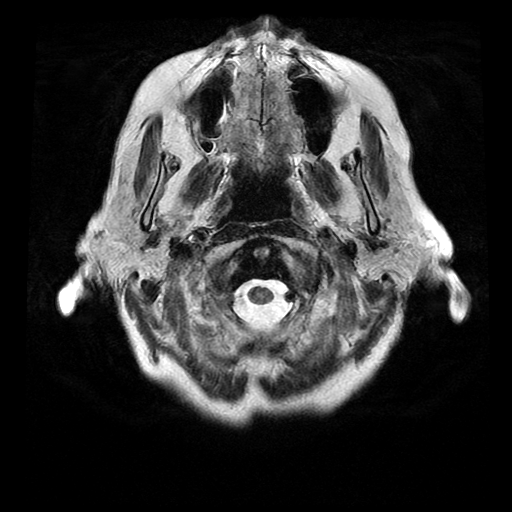
[im 25/25]
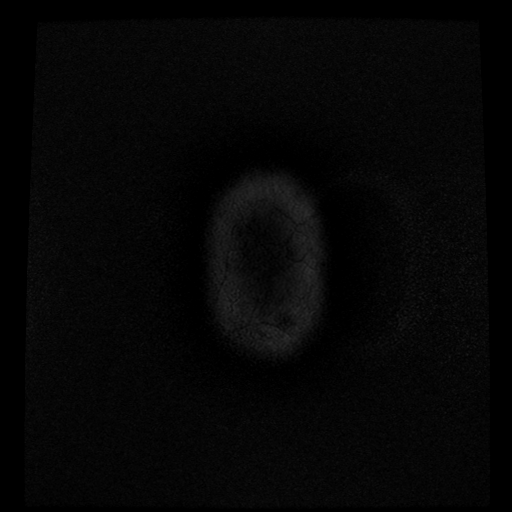

[Series 7: FLAIR · axial · 3.0mm · 0.43mm/px · z∈[-76,+68]mm · 2 of 25 slices shown]
[im 1/25]
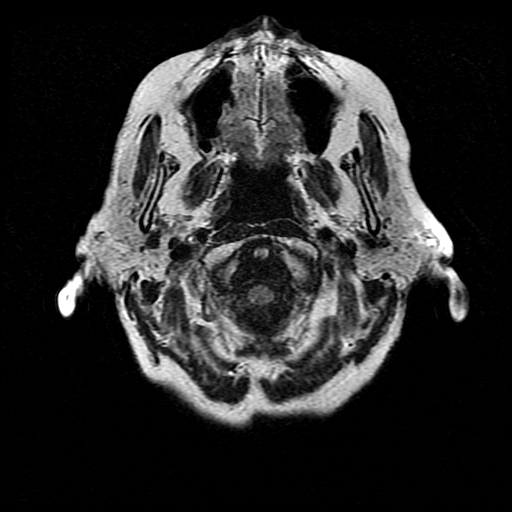
[im 25/25]
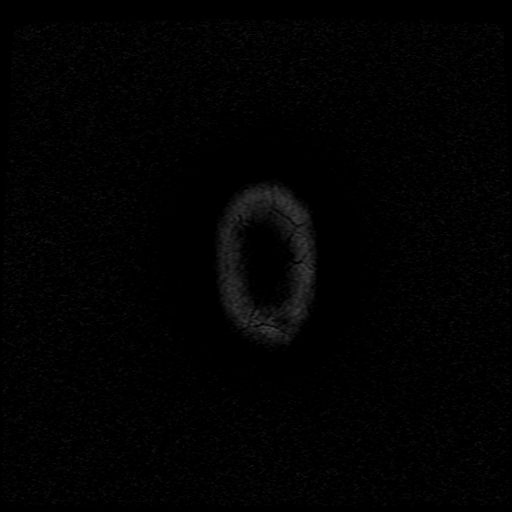

[Series 11: T1 post-contrast · axial · 3.0mm · 0.47mm/px · z∈[-77,+70]mm · 4 of 50 slices shown (1 of 3)]
[im 1/50]
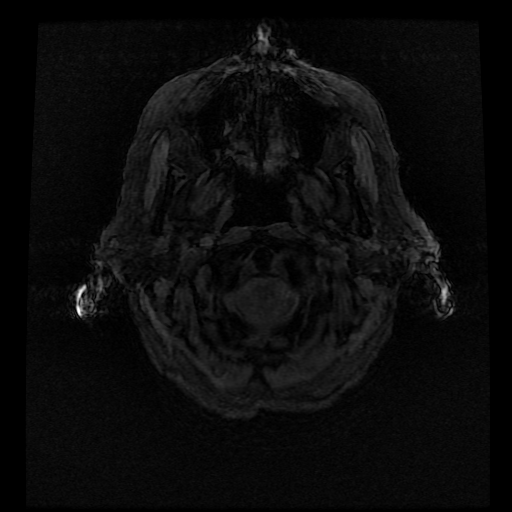
[im 17/50]
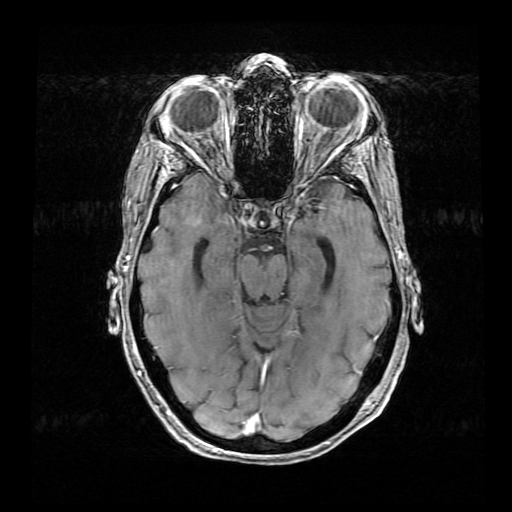
[im 33/50]
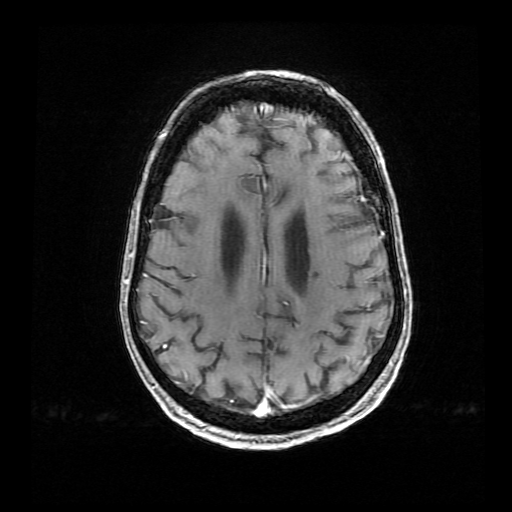
[im 50/50]
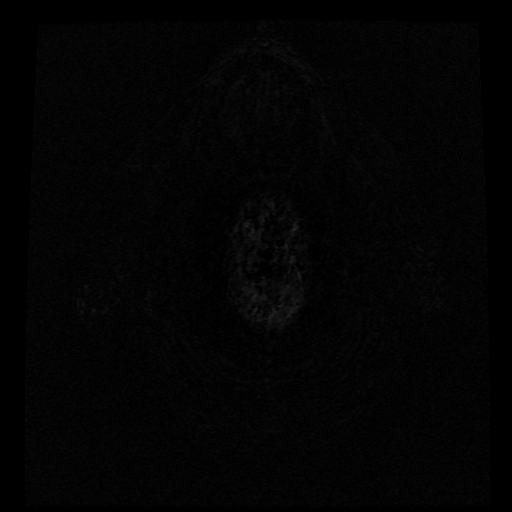

[Series 12: T1 post-contrast · coronal · 5.0mm · 0.39mm/px · 2 of 27 slices shown (2 of 3)]
[im 1/27]
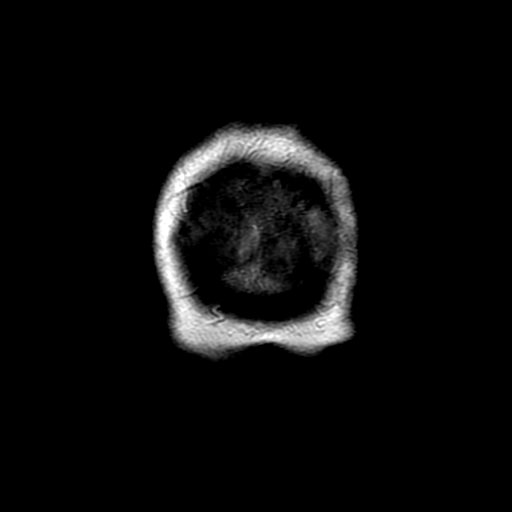
[im 27/27]
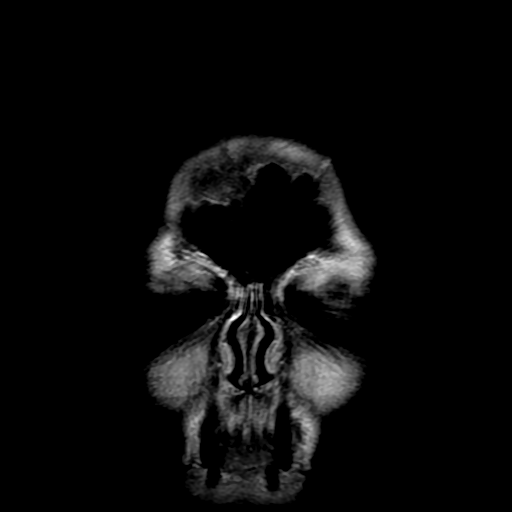

[Series 13: T1 post-contrast · sagittal · 5.0mm · 0.47mm/px · 2 of 23 slices shown (3 of 3)]
[im 1/23]
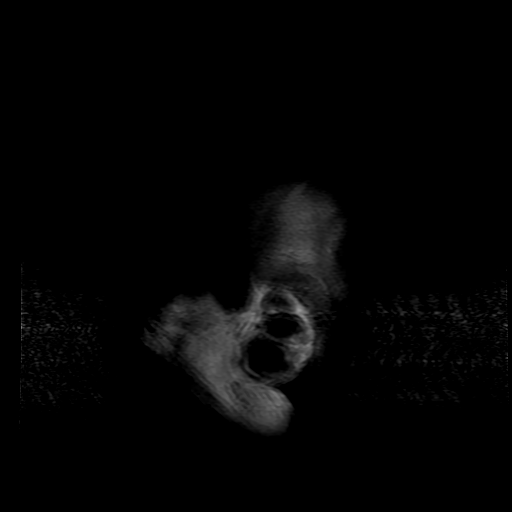
[im 23/23]
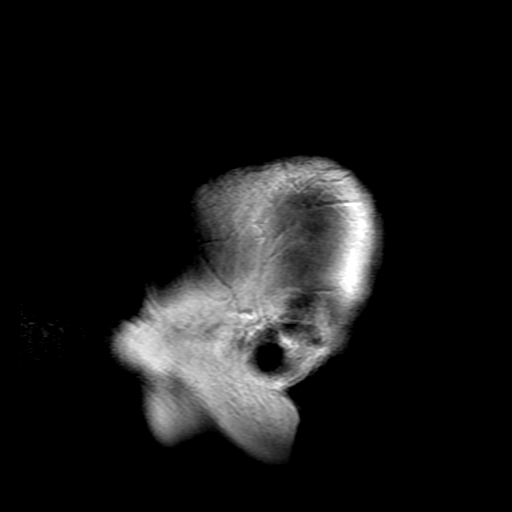

[Series 300: DWI · axial · 3.0mm · 1.09mm/px · z∈[-77,+70]mm · 4 of 50 slices shown (3 of 4)]
[im 1/50]
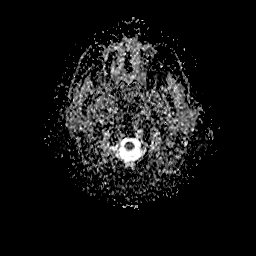
[im 17/50]
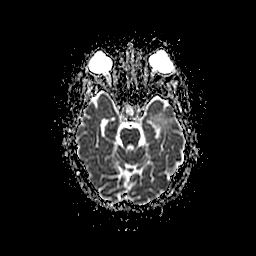
[im 33/50]
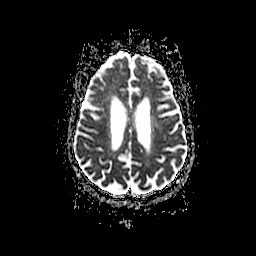
[im 50/50]
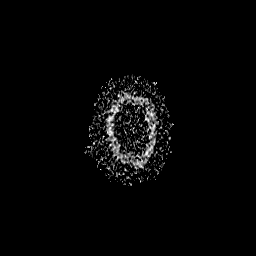

[Series 400: DWI · coronal · 5.0mm · 1.09mm/px · 3 of 35 slices shown (4 of 4)]
[im 1/35]
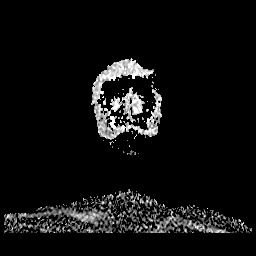
[im 18/35]
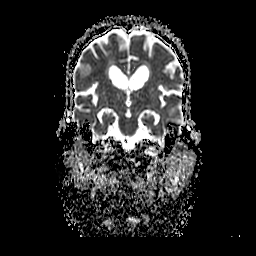
[im 35/35]
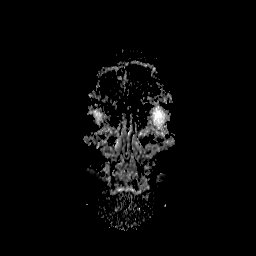

[34 of 48 positions shown; findings below may reference images not displayed]

FINDINGS: Brain: Small focus of mildly reduced diffusion in the posterior left
frontal lobe. Probable small foci of mild cortical diffusion
hyperintensity in the left parietal lobe.

There is no evidence of intracranial hemorrhage. There is no
intracranial mass or mass effect. There is no hydrocephalus or
extra-axial fluid collection. Prominence of the ventricles and sulci
reflects stable parenchymal volume loss. Patchy and confluent areas
of T2 hyperintensity in the supratentorial and pontine white matter
are nonspecific but probably reflect stable chronic microvascular
ischemic changes. A few superimposed chronic small vessel infarcts
again noted.

No abnormal enhancement.

Vascular: Major vessel flow voids at the skull base are stable in
appearance with diminished right vertebral flow void.

Skull and upper cervical spine: Normal marrow signal is preserved.

Sinuses/Orbits: Paranasal sinuses are aerated. Orbits are
unremarkable.

Other: Sella is partially empty.  Mastoid air cells are clear.
IMPRESSION: Few small acute to subacute infarcts in the left frontal and
parietal lobes.

Stable chronic microvascular ischemic changes.

## 2020-12-13 MED ORDER — INSULIN ASPART 100 UNIT/ML IJ SOLN
8.0000 [IU] | Freq: Once | INTRAMUSCULAR | Status: AC
  Administered 2020-12-13: 8 [IU] via SUBCUTANEOUS

## 2020-12-13 MED ORDER — SODIUM CHLORIDE 0.9 % IV BOLUS (SEPSIS)
500.0000 mL | Freq: Once | INTRAVENOUS | Status: AC
  Administered 2020-12-13: 500 mL via INTRAVENOUS

## 2020-12-13 MED ORDER — SODIUM CHLORIDE 0.9 % IV SOLN
1.0000 g | Freq: Once | INTRAVENOUS | Status: AC
  Administered 2020-12-13: 1 g via INTRAVENOUS
  Filled 2020-12-13: qty 10

## 2020-12-13 MED ORDER — SODIUM CHLORIDE 0.9 % IV SOLN
1000.0000 mL | INTRAVENOUS | Status: DC
  Administered 2020-12-13 – 2020-12-14 (×2): 1000 mL via INTRAVENOUS

## 2020-12-13 MED ORDER — GADOBUTROL 1 MMOL/ML IV SOLN
6.0000 mL | Freq: Once | INTRAVENOUS | Status: AC | PRN
  Administered 2020-12-13: 6 mL via INTRAVENOUS

## 2020-12-13 NOTE — ED Provider Notes (Signed)
     Provider Note MRN:  [id number]  Arrival date & time: 12/14/20    ED Course and Medical Decision Making  Assumed care from Dr Tomi Bamberger at shift change.  Patient up to the ED for hyperglycemia, altered mental status.  See note from Dr. Tomi Bamberger for full details.  In short patient presented to the ER secondary to vision changes, altered mental status, confusion.  Mildly improved in last 24 hours.  Patient with intermittent hyperglycemia likely attributed to steroids. Glycemic status is fair, doubt this is etiology of her current symptoms. MRI demonstrates small acute to subacute infarcts in left frontal parietal lobes. D/w neurology who recommends admission for thromboembolic workup. D/w admitting Dr Cyd Silence who accepts patient for admission.    Procedures  Final Clinical Impressions(s) / ED Diagnoses     ICD-10-CM   1. Cerebrovascular accident (CVA), unspecified mechanism ([place])  I63.9     2. Hyperglycemia  R73.9       ED Discharge Orders     None       Discharge Instructions   None       Jeanell Sparrow, DO 12/14/20 2359

## 2020-12-13 NOTE — H&P (Signed)
History and Physical    AHILYN NELL TOI:[id number] DOB: 22-Jun-1934 DOA: 12/13/2020  PCP: Sharilyn Sites, MD  Patient coming from: Home with family   Chief Complaint:  Chief Complaint  Patient presents with   Hyperglycemia   Altered Mental Status     HPI:    86 year old female with past medical history of hypertension, hyperlipidemia, insulin-dependent diabetes mellitus type 2 (hgba1c 6.3% 10/2020), recent diagnosis with temporal arteritis who presents to [facility] emergency department with family due to concerns for progressive confusion and hyperglycemia with worsening visual changes.  Of note, patient was recently admitted to [facility] 9/19 with rather rapid onset of right eye vision loss.  Ultimately diagnosed with temporal arteritis via right temporal artery biopsy.  MRI brain additionally revealed two small right cerebellar infarcts.  Due to markedly elevated inflammatory markers a right temporal artery biopsy was performed which was consistent with temporal arteritis.  Patient was given a 3-day course of high-dose intravenous steroids followed by a prolonged course of oral prednisone.  Patient was discharged to inpatient rehab on 9/30 with continued prednisone as well as dual antiplatelet therapy.  Patient was eventually discharged to home from inpatient rehab on 10/7.  The past several days patient has been experiencing progressively worsening bouts of confusion as well as increasing vision loss.  Patient denies associated facial droop, slurring of speech, loss of balance, focal weakness or headaches with the symptoms.  Due to patient's progressively worsening symptoms patient's daughter was quite concerned and she was eventually brought into [facility] emergency department for evaluation.  Of note, patient reports being compliant with her home regimen of prednisone therapy.  Upon evaluation in the emergency department code stroke was not called and tPA  was not administered due to patient being outside of the tPA window.  Case was discussed between the emergency department provider and Dr. Leonel Ramsay with neurology.  MRI was performed revealing a few small acute subacute infarction left frontal and parietal lobes.  The hospitalist group was then called to assess the patient for admission to the hospital.    Review of Systems:   Review of Systems  Unable to perform ROS: Mental acuity   Past Medical History:  Diagnosis Date   Diabetes mellitus type II    DNR (do not resuscitate) 11/08/2020   Hypercholesteremia    Hypertension     Past Surgical History:  Procedure Laterality Date   ABDOMINAL HYSTERECTOMY  1991   [facility], [place]   left, [place]   ARTERY BIOPSY Right 11/09/2020   Procedure: BIOPSY TEMPORAL ARTERY;  Surgeon: Cherre Robins, MD;  Location: [facility];  Service: Vascular;  Laterality: Right;   CATARACT EXTRACTION W/ INTRAOCULAR LENS IMPLANT  06/10/06    Right, [facility], [place]   CATARACT EXTRACTION W/PHACO  09/18/2010   Procedure: CATARACT EXTRACTION PHACO AND INTRAOCULAR LENS PLACEMENT ([place]);  Surgeon: Williams Che;  Location: [facility];  Service: Ophthalmology;  Laterality: Left;   COLONOSCOPY W/ POLYPECTOMY  06/20/10   [facility], [place]   HIP ARTHROPLASTY Left 02/18/2020   Procedure: ARTHROPLASTY BIPOLAR HIP (HEMIARTHROPLASTY);  Surgeon: Mordecai Rasmussen, MD;  Location: [facility];  Service: Orthopedics;  Laterality: Left;     reports that she has never smoked. She has never used smokeless tobacco. She reports that she does not drink alcohol and does not use drugs.  No Known Allergies  Family History  Problem Relation Age of Onset   Anesthesia problems  Neg Hx      Prior to Admission medications   Medication Sig Start Date End Date Taking? Authorizing Provider  acetaminophen (TYLENOL) 325 MG tablet Take 325-650 mg by mouth every 6 (six) hours as needed for moderate pain or headache.   Yes [provider]   amLODipine (NORVASC) 10 MG tablet Take 1 tablet (10 mg total) by mouth daily. 11/25/20  Yes Angiulli, Lavon Paganini, PA-C  aspirin EC 81 MG EC tablet Take 1 tablet (81 mg total) by mouth daily. Swallow whole. 11/19/20  Yes Shawna Clamp, MD  atorvastatin (LIPITOR) 40 MG tablet Take 1 tablet (40 mg total) by mouth daily. 11/24/20  Yes Angiulli, Lavon Paganini, PA-C  clopidogrel (PLAVIX) 75 MG tablet 1 tablet daily x5 more days for CVA prophylaxis 11/24/20  Yes Angiulli, Lavon Paganini, PA-C  dorzolamide (TRUSOPT) 2 % ophthalmic solution Place 1 drop into both eyes 2 (two) times daily. 11/24/20  Yes Angiulli, Lavon Paganini, PA-C  HYDROcodone-acetaminophen (NORCO/VICODIN) 5-325 MG tablet Take 1-2 tablets by mouth every 4 (four) hours as needed for moderate pain. 11/24/20  Yes Angiulli, Lavon Paganini, PA-C  losartan (COZAAR) 25 MG tablet Take 1 tablet (25 mg total) by mouth daily. 11/24/20  Yes Angiulli, Lavon Paganini, PA-C  Multiple Vitamin (MULTIVITAMIN) tablet Take 1 tablet by mouth daily.   Yes [provider]  predniSONE (DELTASONE) 10 MG tablet 5 tabs daily x10 days then 4 tabs daily x2 weeks then 3 tabs daily x2 weeks then 2 tabs daily x2 weeks then 1 tab daily x2 weeks and stop 11/24/20  Yes Angiulli, Lavon Paganini, PA-C  sitaGLIPtin (JANUVIA) 100 MG tablet Take 100 mg by mouth daily.   Yes [provider]  docusate sodium (COLACE) 100 MG capsule Take 1 capsule (100 mg total) by mouth 2 (two) times daily. Patient not taking: No sig reported 11/24/20   Angiulli, Lavon Paganini, PA-C  insulin glargine-yfgn (SEMGLEE) 100 UNIT/ML injection Inject 0.12 mLs (12 Units total) into the skin daily. Patient not taking: No sig reported 11/25/20   Cathlyn Parsons, PA-C    Physical Exam: Vitals:   12/13/20 2230 12/13/20 2300 12/13/20 2337 12/14/20 0057  BP: (!) 125/47 (!) 143/89  (!) 144/75  Pulse: (!) 42   80  Resp: (!) 22 18  16   Temp:   98.6 F (37 C) 98.4 F (36.9 C)  TempSrc:    Oral  SpO2: 98% 97%  99%   Constitutional:  Awake alert and oriented x2, no associated distress.   Skin: no rashes, no lesions, good skin turgor noted. Eyes: Pupils are equally reactive to light.  No evidence of scleral icterus or conjunctival pallor.  ENMT: Moist mucous membranes noted.  Posterior pharynx clear of any exudate or lesions.   Neck: normal, supple, no masses, no thyromegaly.  No evidence of jugular venous distension.   Respiratory: clear to auscultation bilaterally, no wheezing, no crackles. Normal respiratory effort. No accessory muscle use.  Cardiovascular: Regular rate and rhythm, no murmurs / rubs / gallops. No extremity edema. 2+ pedal pulses. No carotid bruits.  Chest:   Nontender without crepitus or deformity.   Back:   Nontender without crepitus or deformity. Abdomen: Abdomen is soft and nontender.  No evidence of intra-abdominal masses.  Positive bowel sounds noted in all quadrants.   Musculoskeletal: No joint deformity upper and lower extremities. Good ROM, no contractures. Normal muscle tone.  Neurologic: Awake alert and oriented x2.  Gross visual field loss of both eyes with the  ability only to make out shapes and forms of light bilaterally.  CN 2-12 grossly intact. Sensation intact.  Patient moving all 4 extremities spontaneously.  Patient is following all commands.  Patient is responsive to verbal stimuli.   Psychiatric: Patient exhibits normal mood with inappropriately flat affect.  Patient does not seem to possess insight as to her current situation.  Labs on Admission: I have personally reviewed following labs and imaging studies -   CBC: Recent Labs  Lab 12/13/20 1329 12/13/20 1338  WBC  --  6.0  NEUTROABS  --  3.6  HGB 12.9  12.2 12.5  HCT 38.0  36.0 38.4  MCV  --  91.2  PLT  --  259   Basic Metabolic Panel: Recent Labs  Lab 12/13/20 1329 12/13/20 1338  NA 133*  133* 131*  K 3.9  3.9 3.8  CL 100 97*  CO2  --  23  GLUCOSE 439* 439*  BUN 17 17  CREATININE 0.80 0.97  CALCIUM  --  9.2   MG  --  2.1   GFR: CrCl cannot be calculated (Unknown ideal weight.). Liver Function Tests: Recent Labs  Lab 12/13/20 1338  AST 15  ALT 22  ALKPHOS 76  BILITOT 0.8  PROT 6.5  ALBUMIN 3.3*   No results for input(s): LIPASE, AMYLASE in the last 168 hours. No results for input(s): AMMONIA in the last 168 hours. Coagulation Profile: Recent Labs  Lab 12/13/20 1338  INR 1.0   Cardiac Enzymes: No results for input(s): CKTOTAL, CKMB, CKMBINDEX, TROPONINI in the last 168 hours. BNP (last 3 results) No results for input(s): PROBNP in the last 8760 hours. HbA1C: No results for input(s): HGBA1C in the last 72 hours. CBG: Recent Labs  Lab 12/13/20 1306 12/13/20 1526 12/14/20 0335  GLUCAP 405* 326* 212*   Lipid Profile: No results for input(s): CHOL, HDL, LDLCALC, TRIG, CHOLHDL, LDLDIRECT in the last 72 hours. Thyroid Function Tests: No results for input(s): TSH, T4TOTAL, FREET4, T3FREE, THYROIDAB in the last 72 hours. Anemia Panel: No results for input(s): VITAMINB12, FOLATE, FERRITIN, TIBC, IRON, RETICCTPCT in the last 72 hours. Urine analysis:    Component Value Date/Time   COLORURINE YELLOW 12/13/2020 1630   APPEARANCEUR CLOUDY (A) 12/13/2020 1630   LABSPEC 1.015 12/13/2020 1630   PHURINE 5.0 12/13/2020 1630   GLUCOSEU >=500 (A) 12/13/2020 1630   HGBUR NEGATIVE 12/13/2020 [place] 12/13/2020 1630   KETONESUR NEGATIVE 12/13/2020 1630   PROTEINUR NEGATIVE 12/13/2020 1630   NITRITE POSITIVE (A) 12/13/2020 1630   LEUKOCYTESUR MODERATE (A) 12/13/2020 1630    Radiological Exams on Admission - Personally Reviewed: DG Chest 2 View  Result Date: 12/13/2020 CLINICAL DATA:  Weakness EXAM: CHEST - 2 VIEW COMPARISON:  Chest x-ray 06/27/2020 FINDINGS: Heart size and mediastinum appear stable and within normal limits. No focal consolidation identified. No pleural effusion or pneumothorax. Bones are osteopenic. IMPRESSION: No acute intrathoracic process  identified. Electronically Signed   By: Ofilia Neas   On: 12/13/2020 14:17   MR Brain W and Wo Contrast  Result Date: 12/13/2020 CLINICAL DATA:  Neuro deficit, acute, stroke suspected vision changes and confusion, hx of recent stroke and temporal arteritis EXAM: MRI HEAD WITHOUT AND WITH CONTRAST TECHNIQUE: Multiplanar, multiecho pulse sequences of the brain and surrounding structures were obtained without and with intravenous contrast. CONTRAST:  28mL GADAVIST GADOBUTROL 1 MMOL/ML IV SOLN COMPARISON:  11/08/2020 FINDINGS: Brain: Small focus of mildly reduced diffusion in the posterior left frontal lobe.  Probable small foci of mild cortical diffusion hyperintensity in the left parietal lobe. There is no evidence of intracranial hemorrhage. There is no intracranial mass or mass effect. There is no hydrocephalus or extra-axial fluid collection. Prominence of the ventricles and sulci reflects stable parenchymal volume loss. Patchy and confluent areas of T2 hyperintensity in the supratentorial and pontine white matter are nonspecific but probably reflect stable chronic microvascular ischemic changes. A few superimposed chronic small vessel infarcts again noted. No abnormal enhancement. Vascular: Major vessel flow voids at the skull base are stable in appearance with diminished right vertebral flow void. Skull and upper cervical spine: Normal marrow signal is preserved. Sinuses/Orbits: Paranasal sinuses are aerated. Orbits are unremarkable. Other: Sella is partially empty.  Mastoid air cells are clear. IMPRESSION: Few small acute to subacute infarcts in the left frontal and parietal lobes. Stable chronic microvascular ischemic changes. Electronically Signed   By: Macy Mis M.D.   On: 12/13/2020 18:59    EKG: Personally reviewed.  Rhythm is sinus tachycardia with heart rate of 106 bpm.  Evidence of associated PACs no dynamic ST segment changes appreciated.  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   Performing serial neurologic checks Monitoring patient on telemetry Initiating aspirin therapy Daily statin therapy Per my discussion with neurology repeat CTA of the head and neck is not necessary. Obtaining hemoglobin A1c and lipid panel in the morning Echocardiogram in the morning PT, OT, SLP evaluation Permissive hypertension with as needed antihypertensives only to be given if blood pressure greater than 220/115 Neurology following in consultation.   Type 2 diabetes mellitus with hyperglycemia, with long-term current use of insulin (HCC) Substantial hyperglycemia likely secondary to ongoing steroid use and possible noncompliance Patient been placed on Accu-Cheks before every meal and nightly with sliding scale insulin Placing patient back on home regimen of basal insulin therapy Hemoglobin A1C ordered Diabetic Diet   Acute cystitis without hematuria Although I personally was unable to family, according to review of emergency department notes daughter has been concerned about bouts of worsening confusion in the past several days  Bouts confusion are unlikely to be due to patient's stroke or temporal arteritis. Urinalysis is suggestive of a urinary tract infection.  I would be inclined to say this is asymptomatic bacteriuria but with the waxing and waning encephalopathy it may be a urinary tract infection Initiated on intravenous ceftriaxone for at least 3 days Urine culture obtained  Temporal arteritis  ([place]) Diagnosis of temporal arteritis with right eye visual loss confirmed via biopsy during hospitalization in September Patient was treated with several days of high-dose intravenous steroids followed by a prolonged prednisone steroid taper at time of discharge Is unclear as to whether patient has been compliant with this regimen Patient now seems to additionally be exhibiting left eye visual loss as well which makes me concerned that this is progression of the 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   Essential hypertension Holding patient's home regimen of oral antihypertensives for now until given clearance by neurology PRN intravenous antihypertensives for excessively elevated blood pressure in excess of 220/115        Code Status:  DNR  code status decision has been confirmed with patient.  Family Communication: I have attempted to contact the daughter via the listed home phone number and have been unsuccessful.  Status is: Inpatient  Remains inpatient appropriate because:   Progressively worsening neurologic deficits secondary to acute/subacute strokes as well as concern for progressive blindness secondary to temporal arteritis.  Patient requires extremely close work-up with frequent neurologic checks, continued management of blood pressure and blood sugar and coordination with expert consultation        Vernelle Emerald MD [facility] Pager [phone]  If 7PM-7AM, please contact night-coverage [email or web address] Use universal [place] password for that web site. If you do not have the password, please call the hospital operator.  12/14/2020, 3:40 AM

## 2020-12-13 NOTE — ED Provider Notes (Signed)
[facility] EMERGENCY DEPARTMENT Provider Note   CSN: [id number] Arrival date & time: 12/13/20  1154     History Chief Complaint  Patient presents with   Hyperglycemia   Altered Mental Status    Jenna Long is a 86 y.o. female.   Hyperglycemia Associated symptoms: altered mental status   Altered Mental Status  Patient was recently admitted to the hospital on September 19 where she was diagnosed with temporal arteritis.  This admission was precipitated by vision loss in her right eye.  Patient did have temporal artery biopsy that confirmed temporal arteritis.  Patient was readmitted to the hospital on September 30 and discharged on October 7.  Patient also had MRI testing that showed evidence of 2 subcentimeter acute infarcts in the right cerebellar hemisphere.  She also had evidence of bilateral optic nerve perineuritis.  Patient was dated with steroids.  She has been tapering off her steroids.  Family states over the last couple of days patient's had more vision difficulty.  She also has been somewhat confused.  The symptoms are somewhat better today.  Has had persistent high blood sugar for this reason family brought her back into the ED.  Patient denies any focal numbness or weakness.  Her vision continues to be poor out of her right eye.  She is not having any trouble with her speech.  No headache.  No fevers or chills.  Patient was post be taking insulin but could not afford the prescription.  Past Medical History:  Diagnosis Date   Diabetes mellitus type II    DNR (do not resuscitate) 11/08/2020   Hypercholesteremia    Hypertension     Patient Active Problem List   Diagnosis Date Noted   H/O temporal arteritis 11/18/2020   Vision loss of right eye    Dyslipidemia    Controlled type 2 diabetes mellitus with complication, with long-term current use of insulin (HCC)    Cerebral thrombosis with cerebral infarction 11/09/2020   Temporal arteritis ([place])  11/08/2020   Diabetes mellitus type 2 in nonobese ([place]) 11/08/2020   DNR (do not resuscitate) 11/08/2020   Left displaced femoral neck fracture ([place]) 02/18/2020   Essential hypertension 02/18/2020   Mixed hyperlipidemia 02/18/2020   Fall     Past Surgical History:  Procedure Laterality Date   ABDOMINAL HYSTERECTOMY  1991   [facility], [place]   left, [place]   ARTERY BIOPSY Right 11/09/2020   Procedure: BIOPSY TEMPORAL ARTERY;  Surgeon: Cherre Robins, MD;  Location: [place];  Service: Vascular;  Laterality: Right;   CATARACT EXTRACTION W/ INTRAOCULAR LENS IMPLANT  06/10/06    Right, [facility], [place]   CATARACT EXTRACTION W/PHACO  09/18/2010   Procedure: CATARACT EXTRACTION PHACO AND INTRAOCULAR LENS PLACEMENT ([place]);  Surgeon: Williams Che;  Location: [facility];  Service: Ophthalmology;  Laterality: Left;   COLONOSCOPY W/ POLYPECTOMY  06/20/10   [facility], [place]   HIP ARTHROPLASTY Left 02/18/2020   Procedure: ARTHROPLASTY BIPOLAR HIP (HEMIARTHROPLASTY);  Surgeon: Mordecai Rasmussen, MD;  Location: [facility];  Service: Orthopedics;  Laterality: Left;     OB History   No obstetric history on file.     Family History  Problem Relation Age of Onset   Anesthesia problems Neg Hx     Social History   Tobacco Use   Smoking status: Never   Smokeless tobacco: Never  Substance Use Topics   Alcohol use: No   Drug use: No  Home Medications Prior to Admission medications   Medication Sig Start Date End Date Taking? Authorizing Provider  acetaminophen (TYLENOL) 325 MG tablet Take 325-650 mg by mouth every 6 (six) hours as needed for moderate pain or headache.    [provider]  amLODipine (NORVASC) 10 MG tablet Take 1 tablet (10 mg total) by mouth daily. 11/25/20   Angiulli, Lavon Paganini, PA-C  aspirin EC 81 MG EC tablet Take 1 tablet (81 mg total) by mouth daily. Swallow whole. 11/19/20   Shawna Clamp, MD  atorvastatin (LIPITOR) 40 MG tablet Take 1 tablet (40 mg total) by  mouth daily. 11/24/20   Angiulli, Lavon Paganini, PA-C  clopidogrel (PLAVIX) 75 MG tablet 1 tablet daily x5 more days for CVA prophylaxis 11/24/20   Angiulli, Lavon Paganini, PA-C  docusate sodium (COLACE) 100 MG capsule Take 1 capsule (100 mg total) by mouth 2 (two) times daily. 11/24/20   Angiulli, Lavon Paganini, PA-C  dorzolamide (TRUSOPT) 2 % ophthalmic solution Place 1 drop into both eyes 2 (two) times daily. 11/24/20   Angiulli, Lavon Paganini, PA-C  HYDROcodone-acetaminophen (NORCO/VICODIN) 5-325 MG tablet Take 1-2 tablets by mouth every 4 (four) hours as needed for moderate pain. 11/24/20   Angiulli, Lavon Paganini, PA-C  insulin glargine-yfgn (SEMGLEE) 100 UNIT/ML injection Inject 0.12 mLs (12 Units total) into the skin daily. 11/25/20   Angiulli, Lavon Paganini, PA-C  losartan (COZAAR) 25 MG tablet Take 1 tablet (25 mg total) by mouth daily. 11/24/20   Angiulli, Lavon Paganini, PA-C  Multiple Vitamin (MULTIVITAMIN) tablet Take 1 tablet by mouth daily.    [provider]  predniSONE (DELTASONE) 10 MG tablet 5 tabs daily x10 days then 4 tabs daily x2 weeks then 3 tabs daily x2 weeks then 2 tabs daily x2 weeks then 1 tab daily x2 weeks and stop 11/24/20   Angiulli, Lavon Paganini, PA-C    Allergies    Patient has no known allergies.  Review of Systems   Review of Systems  All other systems reviewed and are negative.  Physical Exam Updated Vital Signs BP (!) 158/91 (BP Location: Left Arm)   Pulse (!) 49   Temp 98.6 F (37 C) (Oral)   Resp 14   SpO2 97%   Physical Exam Vitals and nursing note reviewed.  Constitutional:      General: She is not in acute distress.    Appearance: She is well-developed.  HENT:     Head: Normocephalic and atraumatic.     Right Ear: External ear normal.     Left Ear: External ear normal.  Eyes:     General: No scleral icterus.       Right eye: No discharge.        Left eye: No discharge.     Conjunctiva/sclera: Conjunctivae normal.  Neck:     Trachea: No tracheal deviation.   Cardiovascular:     Rate and Rhythm: Normal rate and regular rhythm.  Pulmonary:     Effort: Pulmonary effort is normal. No respiratory distress.     Breath sounds: Normal breath sounds. No stridor. No wheezing or rales.  Abdominal:     General: Bowel sounds are normal. There is no distension.     Palpations: Abdomen is soft.     Tenderness: There is no abdominal tenderness. There is no guarding or rebound.  Musculoskeletal:        General: No tenderness.     Cervical back: Neck supple.  Skin:    General: Skin is  warm and dry.     Findings: No rash.  Neurological:     Mental Status: She is alert and oriented to person, place, and time.     Cranial Nerves: No cranial nerve deficit (No facial droop, extraocular movements intact, tongue midline ).     Sensory: No sensory deficit.     Motor: No abnormal muscle tone or seizure activity.     Coordination: Coordination normal.     Comments: No pronator drift bilateral upper extrem, able to hold both legs off bed for 5 seconds, sensation intact in all extremities, no visual field cuts, no left or right sided neglect, normal finger-nose exam bilaterally, no nystagmus noted     ED Results / Procedures / Treatments   Labs (all labs ordered are listed, but only abnormal results are displayed) Labs Reviewed  CBC WITH DIFFERENTIAL/PLATELET - Abnormal; Notable for the following components:      Result Value   RDW 18.6 (*)    Abs Immature Granulocytes 0.08 (*)    All other components within normal limits  COMPREHENSIVE METABOLIC PANEL - Abnormal; Notable for the following components:   Sodium 131 (*)    Chloride 97 (*)    Glucose, Bld 439 (*)    Albumin 3.3 (*)    GFR, Estimated 58 (*)    All other components within normal limits  OSMOLALITY - Abnormal; Notable for the following components:   Osmolality 300 (*)    All other components within normal limits  APTT - Abnormal; Notable for the following components:   aPTT 21 (*)    All  other components within normal limits  CBG MONITORING, ED - Abnormal; Notable for the following components:   Glucose-Capillary 405 (*)    All other components within normal limits  I-STAT CHEM 8, ED - Abnormal; Notable for the following components:   Sodium 133 (*)    Glucose, Bld 439 (*)    Calcium, Ion 1.10 (*)    All other components within normal limits  I-STAT VENOUS BLOOD GAS, ED - Abnormal; Notable for the following components:   pH, Ven 7.474 (*)    pCO2, Ven 32.0 (*)    Sodium 133 (*)    Calcium, Ion 1.10 (*)    All other components within normal limits  RESP PANEL BY RT-PCR (FLU A&B, COVID) ARPGX2  BETA-HYDROXYBUTYRIC ACID  MAGNESIUM  ETHANOL  PROTIME-INR  URINALYSIS, ROUTINE W REFLEX MICROSCOPIC  BLOOD GAS, VENOUS  RAPID URINE DRUG SCREEN, HOSP PERFORMED  TROPONIN I (HIGH SENSITIVITY)  TROPONIN I (HIGH SENSITIVITY)    EKG None  Radiology DG Chest 2 View  Result Date: 12/13/2020 CLINICAL DATA:  Weakness EXAM: CHEST - 2 VIEW COMPARISON:  Chest x-ray 06/27/2020 FINDINGS: Heart size and mediastinum appear stable and within normal limits. No focal consolidation identified. No pleural effusion or pneumothorax. Bones are osteopenic. IMPRESSION: No acute intrathoracic process identified. Electronically Signed   By: Ofilia Neas   On: 12/13/2020 14:17    Procedures Procedures   Medications Ordered in ED Medications  sodium chloride 0.9 % bolus 500 mL (has no administration in time range)    Followed by  0.9 %  sodium chloride infusion (has no administration in time range)  insulin aspart (novoLOG) injection 8 Units (has no administration in time range)    ED Course  I have reviewed the triage vital signs and the nursing notes.  Pertinent labs & imaging results that were available during my care of the patient were  reviewed by me and considered in my medical decision making (see chart for details).    MDM Rules/Calculators/A&P                            Patient presented to the ED for evaluation of confusion and visual changes.  Patient has recent history of temporal arteritis and strokes.  She had extensive work-up during her previous hospitalization.  Initial exam is notable for visual defects primarily involving her right eye which was the eye affected by temporal arteritis.  Electrolyte panel shows hyperglycemia but no signs of acidosis.  Serum osmolality only slightly elevated.  Doubt hyperglycemic hyperosmolar syndrome.  We will plan MRI testing.  Evaluate for new stroke.  We will give IV fluids and a dose of insulin.  Care turned over to oncoming team. Final Clinical Impression(s) / ED Diagnoses Final diagnoses:  Hyperglycemia     Dorie Rank, MD 12/13/20 1502

## 2020-12-13 NOTE — Care Management (Signed)
ED RNCM noted a TOC consult for medication assistance. Reviewed patient's chart and  noted patient to have health insurance  so she would not be eligible for medication assistance from [facility].

## 2020-12-13 NOTE — ED Notes (Signed)
Patient transported to MRI 

## 2020-12-13 NOTE — ED Triage Notes (Signed)
Pt arrives with family who reports pt has been altered for a few days, blood sugar in the 400s yesterday and vision trouble.

## 2020-12-13 NOTE — ED Provider Notes (Signed)
Emergency Medicine Provider Triage Evaluation Note  Jenna Long , a 86 y.o. female  was evaluated in triage.  Pt complains of confusion for 3 days, hyperglycemia, and loss of vision in the left eye for 3 days.  Patient was recently admitted for temporal arteritis with loss of vision on the right eye.  Is currently on steroids and aspirin to manage this, however lost vision in her left eye 3 days ago according to patient.  Family states she has been quite confused..  Review of Systems  Positive: Loss of vision, confusion Negative: Chest pain, palpitations, shortness of breath  Physical Exam  BP (!) 158/91 (BP Location: Left Arm)   Pulse (!) 49   Temp 98.6 F (37 C) (Oral)   Resp 14   SpO2 97%  Gen:   Awake, no distress   Resp:  Normal effort  MSK:   Moves extremities without difficulty  Other:  Patient states she cannot see at all in her entire visual field is black, however she does attempt to identify the number of fingers I am holding up states that she can see 2 though I am holding 3.  Cranial nerves 3 through 12 with exception of nine Appear to be intact.  Sensation is intact and there is symmetric strength in the upper and lower extremities bilaterally  Medical Decision Making  Medically screening exam initiated at 1:18 PM.  Appropriate orders placed.  DECHELLE ATTAWAY was informed that the remainder of the evaluation will be completed by another provider, this initial triage assessment does not replace that evaluation, and the importance of remaining in the ED until their evaluation is complete.  CBG with glucose greater than 400.  Proceed with hyperglycemic and stroke work-up.  Code stroke not activated despite vision changes as last known well was greater than 3 days ago.  This chart was dictated using voice recognition software, Dragon. Despite the best efforts of this provider to proofread and correct errors, errors may still occur which can change documentation meaning.      Emeline Darling, PA-C 12/13/20 Stry[place], Adam, DO 12/13/20 1325

## 2020-12-14 ENCOUNTER — Inpatient Hospital Stay (HOSPITAL_COMMUNITY): Payer: PPO

## 2020-12-14 ENCOUNTER — Other Ambulatory Visit: Payer: Self-pay

## 2020-12-14 DIAGNOSIS — I10 Essential (primary) hypertension: Secondary | ICD-10-CM | POA: Diagnosis not present

## 2020-12-14 DIAGNOSIS — I63522 Cerebral infarction due to unspecified occlusion or stenosis of left anterior cerebral artery: Secondary | ICD-10-CM

## 2020-12-14 DIAGNOSIS — I5033 Acute on chronic diastolic (congestive) heart failure: Secondary | ICD-10-CM

## 2020-12-14 LAB — MAGNESIUM: Magnesium: 1.8 mg/dL (ref 1.7–2.4)

## 2020-12-14 LAB — CBC WITH DIFFERENTIAL/PLATELET
Abs Immature Granulocytes: 0.11 10*3/uL — ABNORMAL HIGH (ref 0.00–0.07)
Basophils Absolute: 0 10*3/uL (ref 0.0–0.1)
Basophils Relative: 0 %
Eosinophils Absolute: 0 10*3/uL (ref 0.0–0.5)
Eosinophils Relative: 0 %
HCT: 35.2 % — ABNORMAL LOW (ref 36.0–46.0)
Hemoglobin: 11.9 g/dL — ABNORMAL LOW (ref 12.0–15.0)
Immature Granulocytes: 2 %
Lymphocytes Relative: 31 %
Lymphs Abs: 1.8 10*3/uL (ref 0.7–4.0)
MCH: 30.4 pg (ref 26.0–34.0)
MCHC: 33.8 g/dL (ref 30.0–36.0)
MCV: 89.8 fL (ref 80.0–100.0)
Monocytes Absolute: 0.6 10*3/uL (ref 0.1–1.0)
Monocytes Relative: 11 %
Neutro Abs: 3.3 10*3/uL (ref 1.7–7.7)
Neutrophils Relative %: 56 %
Platelets: 238 10*3/uL (ref 150–400)
RBC: 3.92 MIL/uL (ref 3.87–5.11)
RDW: 18.6 % — ABNORMAL HIGH (ref 11.5–15.5)
WBC: 5.8 10*3/uL (ref 4.0–10.5)
nRBC: 0 % (ref 0.0–0.2)

## 2020-12-14 LAB — LIPID PANEL
Cholesterol: 185 mg/dL (ref 0–200)
HDL: 65 mg/dL (ref >40)
LDL Cholesterol: 86 mg/dL (ref 0–99)
Total CHOL/HDL Ratio: 2.8 RATIO
Triglycerides: 172 mg/dL — ABNORMAL HIGH (ref <150)
VLDL: 34 mg/dL (ref 0–40)

## 2020-12-14 LAB — GLUCOSE, CAPILLARY
Glucose-Capillary: 212 mg/dL — ABNORMAL HIGH (ref 70–99)
Glucose-Capillary: 175 mg/dL — ABNORMAL HIGH (ref 70–99)
Glucose-Capillary: 163 mg/dL — ABNORMAL HIGH (ref 70–99)
Glucose-Capillary: 311 mg/dL — ABNORMAL HIGH (ref 70–99)
Glucose-Capillary: 131 mg/dL — ABNORMAL HIGH (ref 70–99)

## 2020-12-14 LAB — HEMOGLOBIN A1C
Hgb A1c MFr Bld: 7.5 % — ABNORMAL HIGH (ref 4.8–5.6)
Mean Plasma Glucose: 168.55 mg/dL

## 2020-12-14 LAB — COMPREHENSIVE METABOLIC PANEL
ALT: 19 U/L (ref 0–44)
AST: 14 U/L — ABNORMAL LOW (ref 15–41)
Albumin: 2.7 g/dL — ABNORMAL LOW (ref 3.5–5.0)
Alkaline Phosphatase: 68 U/L (ref 38–126)
Anion gap: 8 (ref 5–15)
BUN: 10 mg/dL (ref 8–23)
CO2: 24 mmol/L (ref 22–32)
Calcium: 8.6 mg/dL — ABNORMAL LOW (ref 8.9–10.3)
Chloride: 103 mmol/L (ref 98–111)
Creatinine, Ser: 0.75 mg/dL (ref 0.44–1.00)
GFR, Estimated: >60 mL/min (ref >60)
Glucose, Bld: 235 mg/dL — ABNORMAL HIGH (ref 70–99)
Potassium: 3.3 mmol/L — ABNORMAL LOW (ref 3.5–5.1)
Sodium: 135 mmol/L (ref 135–145)
Total Bilirubin: 0.8 mg/dL (ref 0.3–1.2)
Total Protein: 5.4 g/dL — ABNORMAL LOW (ref 6.5–8.1)

## 2020-12-14 LAB — ECHOCARDIOGRAM COMPLETE BUBBLE STUDY
Area-P 1/2: 3.03 cm2
S' Lateral: 2.5 cm

## 2020-12-14 MED ORDER — CEFTRIAXONE SODIUM 1 G IJ SOLR
1.0000 g | INTRAMUSCULAR | Status: AC
  Administered 2020-12-14 – 2020-12-15 (×2): 1 g via INTRAVENOUS
  Filled 2020-12-14 (×2): qty 10

## 2020-12-14 MED ORDER — CLOPIDOGREL BISULFATE 75 MG PO TABS
75.0000 mg | ORAL_TABLET | Freq: Every day | ORAL | Status: DC
  Administered 2020-12-14: 75 mg via ORAL
  Filled 2020-12-14: qty 1

## 2020-12-14 MED ORDER — INSULIN GLARGINE-YFGN 100 UNIT/ML ~~LOC~~ SOLN
12.0000 [IU] | Freq: Every day | SUBCUTANEOUS | Status: DC
  Administered 2020-12-14 – 2020-12-15 (×3): 12 [IU] via SUBCUTANEOUS
  Filled 2020-12-14 (×4): qty 0.12

## 2020-12-14 MED ORDER — TICAGRELOR 90 MG PO TABS
90.0000 mg | ORAL_TABLET | Freq: Two times a day (BID) | ORAL | Status: DC
  Administered 2020-12-14 – 2020-12-16 (×4): 90 mg via ORAL
  Filled 2020-12-14 (×4): qty 1

## 2020-12-14 MED ORDER — ENOXAPARIN SODIUM 40 MG/0.4ML IJ SOSY
40.0000 mg | PREFILLED_SYRINGE | Freq: Every day | INTRAMUSCULAR | Status: DC
  Administered 2020-12-14 – 2020-12-16 (×3): 40 mg via SUBCUTANEOUS
  Filled 2020-12-14 (×3): qty 0.4

## 2020-12-14 MED ORDER — ASPIRIN EC 81 MG PO TBEC
81.0000 mg | DELAYED_RELEASE_TABLET | Freq: Every day | ORAL | Status: DC
  Administered 2020-12-14 – 2020-12-16 (×3): 81 mg via ORAL
  Filled 2020-12-14 (×3): qty 1

## 2020-12-14 MED ORDER — ATORVASTATIN CALCIUM 40 MG PO TABS
40.0000 mg | ORAL_TABLET | Freq: Every day | ORAL | Status: DC
  Administered 2020-12-14 – 2020-12-16 (×3): 40 mg via ORAL
  Filled 2020-12-14 (×3): qty 1

## 2020-12-14 MED ORDER — ONDANSETRON HCL 4 MG/2ML IJ SOLN: 4.0000 mg | Freq: Four times a day (QID) | INTRAMUSCULAR | Status: DC | PRN

## 2020-12-14 MED ORDER — ACETAMINOPHEN 325 MG PO TABS: 650.0000 mg | ORAL_TABLET | Freq: Four times a day (QID) | ORAL | Status: DC | PRN

## 2020-12-14 MED ORDER — STROKE: EARLY STAGES OF RECOVERY BOOK
Freq: Once | Status: AC
  Filled 2020-12-14: qty 1

## 2020-12-14 MED ORDER — INSULIN ASPART 100 UNIT/ML IJ SOLN
0.0000 [IU] | Freq: Three times a day (TID) | INTRAMUSCULAR | Status: DC
  Administered 2020-12-14: 3 [IU] via SUBCUTANEOUS
  Administered 2020-12-14: 2 [IU] via SUBCUTANEOUS
  Administered 2020-12-14: 3 [IU] via SUBCUTANEOUS
  Administered 2020-12-14: 11 [IU] via SUBCUTANEOUS
  Administered 2020-12-15: 5 [IU] via SUBCUTANEOUS
  Administered 2020-12-15 (×2): 2 [IU] via SUBCUTANEOUS
  Administered 2020-12-15: 5 [IU] via SUBCUTANEOUS

## 2020-12-14 MED ORDER — HYDRALAZINE HCL 20 MG/ML IJ SOLN: 10.0000 mg | Freq: Four times a day (QID) | INTRAMUSCULAR | Status: DC | PRN

## 2020-12-14 MED ORDER — DORZOLAMIDE HCL 2 % OP SOLN
1.0000 [drp] | Freq: Two times a day (BID) | OPHTHALMIC | Status: DC
  Administered 2020-12-14 – 2020-12-16 (×5): 1 [drp] via OPHTHALMIC
  Filled 2020-12-14 (×2): qty 10

## 2020-12-14 MED ORDER — LOSARTAN POTASSIUM 25 MG PO TABS: 25.0000 mg | ORAL_TABLET | Freq: Every day | ORAL | Status: DC

## 2020-12-14 MED ORDER — PREDNISONE 20 MG PO TABS
40.0000 mg | ORAL_TABLET | Freq: Every day | ORAL | Status: DC
  Administered 2020-12-14 – 2020-12-16 (×3): 40 mg via ORAL
  Filled 2020-12-14 (×3): qty 2

## 2020-12-14 MED ORDER — ACETAMINOPHEN 650 MG RE SUPP: 650.0000 mg | Freq: Four times a day (QID) | RECTAL | Status: DC | PRN

## 2020-12-14 MED ORDER — ONDANSETRON HCL 4 MG PO TABS: 4.0000 mg | ORAL_TABLET | Freq: Four times a day (QID) | ORAL | Status: DC | PRN

## 2020-12-14 MED ORDER — INSULIN ASPART 100 UNIT/ML IJ SOLN: 4.0000 [IU] | Freq: Once | INTRAMUSCULAR | Status: DC

## 2020-12-14 MED ORDER — POLYETHYLENE GLYCOL 3350 17 G PO PACK: 17.0000 g | PACK | Freq: Every day | ORAL | Status: DC | PRN

## 2020-12-14 NOTE — Assessment & Plan Note (Signed)
   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

## 2020-12-14 NOTE — Assessment & Plan Note (Signed)
.   Continuing home regimen of lipid lowering therapy.  

## 2020-12-14 NOTE — Evaluation (Signed)
Occupational Therapy Evaluation Patient Details Name: Jenna Long MRN: [id number] DOB: Jul 26, 1934 Today's Date: 12/14/2020   History of Present Illness Pt is an 86 y/o female who presents with increased confusion and decreased vision. MRI revealed acute to subacute infarcts in the L frontal and parietal lobes. Pt with recent admission in September 2022 for posterior circulation strikes and R eye visual loss. Discharge from inpatient rehab 10/8 to home at a supervision level with the rollator. Additional PMH significant for DM II, HTN, orthostatic hypotension.   Clinical Impression   Pt admitted for concerns listed above. PTA pt reported that she was independent with all ADL's and family or an aide would assist with cooking and cleaning. Pt at baseline has R sided visual deficits, at this time she presents with R and L visual deficits, presenting as small spots of vision where she can make out shapes and some colors. Due to her visual deficits, pt requiring min guard and set up for most ADL's for safety. She will [facility] from [facility] to assist with visual compensatory techniques in her home as well as a referral for services of the Blind. OT will follow acutely.      Recommendations for follow up therapy are one component of a multi-disciplinary discharge planning process, led by the attending physician.  Recommendations may be updated based on patient status, additional functional criteria and insurance authorization.   Follow Up Recommendations  Home health OT    Assistance Recommended at Discharge Frequent or constant Supervision/Assistance  Functional Status Assessment  Patient has had a recent decline in their functional status and demonstrates the ability to make significant improvements in function in a reasonable and predictable amount of time.  Equipment Recommendations  None recommended by OT    Recommendations for Other Services       Precautions / Restrictions  Precautions Precautions: Fall Precaution Comments: low vision Restrictions Weight Bearing Restrictions: No      Mobility Bed Mobility Overal bed mobility: Modified Independent             General bed mobility comments: Pt sat EOB with no difficulty    Transfers Overall transfer level: Needs assistance Equipment used: Rolling walker (2 wheels) Transfers: Sit to/from Stand Sit to Stand: Supervision           General transfer comment: Initial stand pt needed assist with grabbing her walker and steadying herself, as she was unable to find the handles of the walker      Balance Overall balance assessment: Needs assistance Sitting-balance support: Feet supported;No upper extremity supported Sitting balance-Leahy Scale: Fair     Standing balance support: Reliant on assistive device for balance Standing balance-Leahy Scale: Poor                             ADL either performed or assessed with clinical judgement   ADL Overall ADL's : Needs assistance/impaired Eating/Feeding: Maximal assistance;Sitting Eating/Feeding Details (indicate cue type and reason): Pt needs max A due to vision deficits. Grooming: Set up;Sitting Grooming Details (indicate cue type and reason): needing set up due to visual deficits Upper Body Bathing: Set up;Min guard;Sitting   Lower Body Bathing: Min guard;Minimal assistance;Sitting/lateral leans;Sit to/from stand   Upper Body Dressing : Set up;Sitting   Lower Body Dressing: Min guard;Minimal assistance;Sit to/from stand;Sitting/lateral leans   Toilet Transfer: Min guard;Ambulation   Toileting- Clothing Manipulation and Hygiene: Min guard;Sitting/lateral lean;Sit to/from stand   Scientist, research (medical):  Min guard;Ambulation   Functional mobility during ADLs: Min guard;Rolling walker (2 wheels) General ADL Comments: Pt requiring set up to min guard for all ADL's due to visual deficits and balance concerns.     Vision Baseline  Vision/History: 1 Wears glasses Ability to See in Adequate Light: 2 Moderately impaired Patient Visual Report: Other (comment) (Pt reports that she is  blind in her R eye) Vision Assessment?: Yes Eye Alignment: Within Functional Limits Ocular Range of Motion: Impaired-to be further tested in functional context Alignment/Gaze Preference: Within Defined Limits Tracking/Visual Pursuits: Impaired - to be further tested in functional context Visual Fields: Other (comment) (Pt seeiing limited spots in both eyes) Depth Perception: Overshoots Additional Comments: Pt presents with potential cortical blindness, as she reports all she sees is blindness, however can report on shapes and certain object colors correctly. Most likely pt confabulating some with her vision, as she would state that OT is holding up fingers but would never look at the OT's hand and when asked to touch the fingers, she was unable to.     Perception     Praxis      Pertinent Vitals/Pain Pain Assessment: No/denies pain     Hand Dominance Right   Extremity/Trunk Assessment Upper Extremity Assessment Upper Extremity Assessment: Overall WFL for tasks assessed   Lower Extremity Assessment Lower Extremity Assessment: Defer to PT evaluation RLE Deficits / Details: >3/5 in quads, hamstrings, hip flexors bilaterally. Decreased ankle DF on the R and coordination with swing through. RLE Sensation: WNL   Cervical / Trunk Assessment Cervical / Trunk Assessment: Other exceptions Cervical / Trunk Exceptions: forward head posture with rounded shoulders   Communication Communication Communication: HOH   Cognition Arousal/Alertness: Awake/alert Behavior During Therapy: WFL for tasks assessed/performed Overall Cognitive Status: No family/caregiver present to determine baseline cognitive functioning                                       General Comments  VSS - Pt needs to follow up with a vision specialist, as she  has severely deficit vision in both eyes.    Exercises     Shoulder Instructions      Home Living Family/patient expects to be discharged to:: Private residence Living Arrangements: Alone Available Help at Discharge: Family;Available 24 hours/day Type of Home: House Home Access: Stairs to enter [employer] of Steps: 3; pt reporting no steps in front Entrance Stairs-Rails: Right;Left;Can reach both Home Layout: One level     Bathroom Shower/Tub: Tub/shower unit;Door   Bathroom Toilet: Handicapped height Bathroom Accessibility: Yes   Home Equipment: Conservation officer, nature (2 wheels);BSC;Cane - single point;Shower seat;Wheelchair - manual;Grab bars - tub/shower      Lives With: Alone;Other (Comment) (planning to move in with her daughter and son in law)    Prior Functioning/Environment Prior Level of Function : Needs assist             Mobility Comments: Pt reports she can manage around her home well with the RW, however needs a little more assist at her daughter's house due to less familiar layout and low vision.          OT Problem List: Decreased strength;Decreased activity tolerance;Impaired balance (sitting and/or standing);Impaired vision/perception;Decreased coordination;Decreased cognition;Decreased safety awareness;Decreased knowledge of use of DME or AE      OT Treatment/Interventions: Self-care/ADL training;Therapeutic exercise;Energy conservation;DME and/or AE instruction;Therapeutic activities;Visual/perceptual remediation/compensation;Patient/family education;Balance training  OT Goals(Current goals can be found in the care plan section) Acute Rehab OT Goals Patient Stated Goal: to go home OT Goal Formulation: With patient Time For Goal Achievement: 12/28/20 Potential to Achieve Goals: Good ADL Goals Pt Will Perform Eating: with set-up;sitting Pt Will Perform Grooming: with set-up;standing Pt Will Perform Upper Body Dressing: with  set-up;sitting Pt Will Perform Lower Body Dressing: with supervision;sitting/lateral leans;sit to/from stand Pt Will Transfer to Toilet: with supervision;ambulating Pt Will Perform Toileting - Clothing Manipulation and hygiene: with supervision;sitting/lateral leans;sit to/from stand Additional ADL Goal #1: Pt will remember and utilize 3 low vision strategies when completeing ADL's.  OT Frequency: Min 2X/week   Barriers to D/C:            Co-evaluation              AM-PAC OT "6 Clicks" Daily Activity     Outcome Measure Help from another person eating meals?: A Lot Help from another person taking care of personal grooming?: A Little Help from another person toileting, which includes using toliet, bedpan, or urinal?: A Little Help from another person bathing (including washing, rinsing, drying)?: A Little Help from another person to put on and taking off regular upper body clothing?: A Little Help from another person to put on and taking off regular lower body clothing?: A Little 6 Click Score: 17   End of Session Equipment Utilized During Treatment: Gait belt;Rolling walker (2 wheels) Nurse Communication: Mobility status  Activity Tolerance: Patient tolerated treatment well Patient left: Other (comment) (Up with PT in room)  OT Visit Diagnosis: Unsteadiness on feet (R26.81);Low vision, both eyes (H54.2);Muscle weakness (generalized) (M62.81)                Time: 2633-3545 OT Time Calculation (min): 38 min Charges:  OT General Charges $OT Visit: 1 Visit OT Evaluation $OT Eval Moderate Complexity: 1 Mod OT Treatments $Therapeutic Activity: 23-37 mins  Taelyr Jantz H., OTR/L Acute Rehabilitation  Pasquale Matters Elane Rayden Scheper 12/14/2020, 5:43 PM

## 2020-12-14 NOTE — ED Notes (Signed)
   12/14/20 0020  Hand-Off documentation  Handoff Received Received from ED RN, ready to receive patient  Report received from (Full Name) Ty Hilts, RN

## 2020-12-14 NOTE — Assessment & Plan Note (Signed)
   Although I personally was unable to family, according to review of emergency department notes daughter has been concerned about bouts of worsening confusion in the past several days   Bouts confusion are unlikely to be due to patient's stroke or temporal arteritis.  Urinalysis is suggestive of a urinary tract infection.  I would be inclined to say this is asymptomatic bacteriuria but with the waxing and waning encephalopathy it may be a urinary tract infection  Initiated on intravenous ceftriaxone for at least 3 days  Urine culture obtained

## 2020-12-14 NOTE — Assessment & Plan Note (Signed)
   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    Performing serial neurologic checks  Monitoring patient on telemetry  Initiating aspirin therapy  Daily statin therapy  Per my discussion with neurology repeat CTA of the head and neck is not necessary. Obtaining hemoglobin A1c and lipid panel in the morning  Echocardiogram in the morning  PT, OT, SLP evaluation  Permissive hypertension with as needed antihypertensives only to be given if blood pressure greater than 220/115  Neurology following in consultation.

## 2020-12-14 NOTE — Progress Notes (Addendum)
Inpatient Diabetes Program Recommendations  AACE/ADA: New Consensus Statement on Inpatient Glycemic Control (2015)  Target Ranges:  Prepandial:   less than 140 mg/dL      Peak postprandial:   less than 180 mg/dL (1-2 hours)      Critically ill patients:  140 - 180 mg/dL   Lab Results  Component Value Date   GLUCAP 163 (H) 12/14/2020   HGBA1C 7.5 (H) 12/14/2020    Review of Glycemic Control Results for Jenna Long, Jenna Long (MRN [id number]) as of 12/14/2020 12:34  Ref. Range 12/13/2020 13:06 12/13/2020 15:26 12/14/2020 03:35 12/14/2020 06:28 12/14/2020 11:51  Glucose-Capillary Latest Ref Range: 70 - 99 mg/dL 405 (H) 326 (H) 212 (H) 175 (H) 163 (H)   Diabetes history: DM 2 Outpatient Diabetes medications: Semglee 12 units daily (was patient taking), Januvia 100 mg daily Current orders for Inpatient glycemic control:  Novolog moderate tid with meals and HS Semglee 12 units q HS Novolog 4 units tid with meals Prednisone 40 mg with breakfast Inpatient Diabetes Program Recommendations:    Note blood sugars increased on admit.  Unclear if patient was taking insulin at home prior to admit.  Will f/u with patient/daughter to discuss.   Thanks,  Adah Perl, RN, BC-ADM Inpatient Diabetes Coordinator Pager [phone]  (8a-5p)  Addendum [phone]- Spoke with patient and her daughter.  Patient was not taking insulin at home and daughter states that "insurance" would not approve.  Patient saw PCP on Monday and they stated that blood sugar was 140 mg/dL so did not reorder.  I explained that it does appear that patient needs insulin especially with Prednisone.  We discussed monitoring, normal blood sugar values and insulin pen. Educated daughter on  insulin pen use at home.  Reviewed all steps if insulin pen including attachment of needle, 2-unit air shot, dialing up dose, giving injection, removing needle, disposal of sharps, storage of unused insulin, disposal of insulin etc. Daughter able to provide  successful return demonstration.  Also reviewed troubleshooting with insulin pen.  MD to give patient Rxs for insulin pens and insulin pen needles. Will follow.

## 2020-12-14 NOTE — Progress Notes (Addendum)
STROKE TEAM PROGRESS NOTE   INTERVAL HISTORY Patient seen in room with no family at the bedside. She has been hemodynamically stable.  She states that she was diagnosed with temporal arteritis recently after having a stroke.  She denies headache and states that she has been taking her DAPT at home without missing doses.  She states that she is planning to move in with her daughter this weekend.  Vitals:   12/14/20 0057 12/14/20 0300 12/14/20 0500 12/14/20 0752  BP: (!) 144/75 (!) 143/59 (!) 142/58 (!) 150/59  Pulse: 80   (!) 46  Resp: 16 16 19 20   Temp: 98.4 F (36.9 C) 97.7 F (36.5 C) 97.9 F (36.6 C) 97.8 F (36.6 C)  TempSrc: Oral Oral Oral Oral  SpO2: 99% 100% 100% 100%  Weight: 58.8 kg     Height: 5\' 7"  (1.702 m)      CBC:  Recent Labs  Lab 12/13/20 1338 12/14/20 0331  WBC 6.0 5.8  NEUTROABS 3.6 3.3  HGB 12.5 11.9*  HCT 38.4 35.2*  MCV 91.2 89.8  PLT 308 500   Basic Metabolic Panel:  Recent Labs  Lab 12/13/20 1338 12/14/20 0331  NA 131* 135  K 3.8 3.3*  CL 97* 103  CO2 23 24  GLUCOSE 439* 235*  BUN 17 10  CREATININE 0.97 0.75  CALCIUM 9.2 8.6*  MG 2.1 1.8   Lipid Panel:  Recent Labs  Lab 12/14/20 0331  CHOL 185  TRIG 172*  HDL 65  CHOLHDL 2.8  VLDL 34  LDLCALC 86   HgbA1c:  Recent Labs  Lab 12/14/20 0331  HGBA1C 7.5*   Urine Drug Screen:  Recent Labs  Lab 12/13/20 1615  LABOPIA NONE DETECTED  COCAINSCRNUR NONE DETECTED  LABBENZ NONE DETECTED  AMPHETMU NONE DETECTED  THCU NONE DETECTED  LABBARB NONE DETECTED    Alcohol Level  Recent Labs  Lab 12/13/20 1339  ETH <10    IMAGING past 24 hours DG Chest 2 View  Result Date: 12/13/2020 CLINICAL DATA:  Weakness EXAM: CHEST - 2 VIEW COMPARISON:  Chest x-ray 06/27/2020 FINDINGS: Heart size and mediastinum appear stable and within normal limits. No focal consolidation identified. No pleural effusion or pneumothorax. Bones are osteopenic. IMPRESSION: No acute intrathoracic process  identified. Electronically Signed   By: Ofilia Neas   On: 12/13/2020 14:17   MR Brain W and Wo Contrast  Result Date: 12/13/2020 CLINICAL DATA:  Neuro deficit, acute, stroke suspected vision changes and confusion, hx of recent stroke and temporal arteritis EXAM: MRI HEAD WITHOUT AND WITH CONTRAST TECHNIQUE: Multiplanar, multiecho pulse sequences of the brain and surrounding structures were obtained without and with intravenous contrast. CONTRAST:  55mL GADAVIST GADOBUTROL 1 MMOL/ML IV SOLN COMPARISON:  11/08/2020 FINDINGS: Brain: Small focus of mildly reduced diffusion in the posterior left frontal lobe. Probable small foci of mild cortical diffusion hyperintensity in the left parietal lobe. There is no evidence of intracranial hemorrhage. There is no intracranial mass or mass effect. There is no hydrocephalus or extra-axial fluid collection. Prominence of the ventricles and sulci reflects stable parenchymal volume loss. Patchy and confluent areas of T2 hyperintensity in the supratentorial and pontine white matter are nonspecific but probably reflect stable chronic microvascular ischemic changes. A few superimposed chronic small vessel infarcts again noted. No abnormal enhancement. Vascular: Major vessel flow voids at the skull base are stable in appearance with diminished right vertebral flow void. Skull and upper cervical spine: Normal marrow signal is preserved. Sinuses/Orbits: Paranasal sinuses  are aerated. Orbits are unremarkable. Other: Sella is partially empty.  Mastoid air cells are clear. IMPRESSION: Few small acute to subacute infarcts in the left frontal and parietal lobes. Stable chronic microvascular ischemic changes. Electronically Signed   By: Macy Mis M.D.   On: 12/13/2020 18:59    PHYSICAL EXAM General: Patient is an alert female in no acute distress.   NEURO:  Mental Status: AA&Ox3  Speech/Language: speech is without dysarthria or aphasia. Patient is able to name some  objects correctly and some incorrectly.   Cranial Nerves:  II: PERRL. Visual deficit and visual agnosia   Patient states the room is dark when it is brightly lit. Unable to identify objects but will guess at answers. Adamant that she can see it. She can not see my tie or hand moving at arms length from her eyes. III, IV, VI: EOMI. Eyelids elevate symmetrically.  V: Sensation is intact to light touch and symmetrical to face.  VII: Smile is symmetrical. Able to puff cheeks and raise eyebrows.  VIII: hearing intact to voice. IX, X: Palate elevates symmetrically. Phonation is normal.  WE:[id number] shrug 5/5. XII: tongue is midline without fasciculations. Motor: 5/5 strength to all muscle groups tested.  Tone: is normal and bulk is normal Sensation- Intact to light touch bilaterally. Extinction absent to light touch to DSS. Sharp/Dull   Vibration.   Coordination: FTN intact bilaterally, HKS: no ataxia in BLE.No drift.  Gait- deferred    ASSESSMENT/PLAN Jenna Long is a 86 y.o. female with history of HTN, HLD, DM Type 2 and temporal arteritis presenting with confusion and visual changes. In 9/22, patient was admitted with visual changes and was diagnosed with temporal arteritis and two small right cerebellar infarcts.  She was discharged to CIR and then to home.  She was readmitted with a UTI, confusion and visual changes.  Confusion is most likely caused by her UTI.  May consider outpatient 30 day cardiac monitor or loop recorder to identify atrial fibrillation. Will continue to treat with IV Rocephin and await PT/OT recommendations to determine safe discharge plan.  Stroke:  left acute to subacute infarct likely embolic possibly secondary to cardioembolic source MRI  Few small acute to subacute infarcts in the left frontal and parietal lobes. Stable chronic microvascular ischemic changes 2D Echo 9/22 EF 76-19%, grade 1 diastolic dysfunction, mild LVH, no atrial level shunt LDL 86 HgbA1c  7.5 VTE prophylaxis - lovenox    Diet   Diet heart healthy/carb modified Room service appropriate? Yes; Fluid consistency: Thin   aspirin 81 mg daily and clopidogrel 75 mg daily prior to admission, now on aspirin 81 mg daily and Brilinta (ticagrelor) 90 mg bid.  Therapy recommendations:  pending Disposition:  pending  Hypertension Home meds:  amlodipine 10 mg daily, losartan 25 mg daily Stable Permissive hypertension (OK if < 220/120) but gradually normalize in 5-7 days Long-term BP goal normotensive  Hyperlipidemia Home meds:  atorvastatin 40 mg daily, resumed in hospital LDL 86, goal < 70 High intensity statin continued Continue statin at discharge  Diabetes type II Uncontrolled in setting of steroid use Home meds:  Insulin Glargine 12 units daily, Januvia 100 mg daily HgbA1c 7.5, goal < 7.0 CBGs Recent Labs    12/13/20 1526 12/14/20 0335 12/14/20 0628  GLUCAP 326* 212* 175*   SSI Blood glucose likely elevated due to prednisone use for temporal arteritis.  Will continue to cover elevated blood glucose with SSI and reassess control when steroid taper is  complete  Other Stroke Risk Factors Advanced Age >/= 52  Hx stroke/TIA Family hx stroke (mother)  Other Active Problems UTI Continue treatment with Rocephin, end 10/27 Temporal Arteritis Continue prednisone taper  Hospital day # 1   Total of 30 mins spent reviewing chart, discussion with patient and family on prognosis, Dx and plan. Discussed case with patient's nurse. Reviewed Imaging personally.   To contact Stroke Continuity provider, please refer to [email or web address] After hours, contact General Neurology

## 2020-12-14 NOTE — Consult Note (Signed)
Neurology Consultation Reason for Consult: Stroke Referring Physician: Cyd Silence, G  CC: Stroke  History is obtained from: Chart review  HPI: Jenna Long is a 86 y.o. female with a history of hypertension, diabetes, hypercholesterolemia who was diagnosed with temporal arteritis in September via positive biopsy.  She had been admitted with some posterior circulation strokes and right eye visual loss and has been maintained on prednisone since that time.  She has been more confused and having more vision difficulty and therefore presented to the emergency department today.   LKW: Unclear tpa given?: no, unclear time of onset  ROS: A 14 point ROS was performed and is negative except as noted in the HPI.  Past Medical History:  Diagnosis Date   Diabetes mellitus type II    DNR (do not resuscitate) 11/08/2020   Hypercholesteremia    Hypertension      Family History  Problem Relation Age of Onset   Anesthesia problems Neg Hx      Social History:  reports that she has never smoked. She has never used smokeless tobacco. She reports that she does not drink alcohol and does not use drugs.  Exam: Current vital signs: BP (!) 144/75 (BP Location: Left Arm)   Pulse 80   Temp 98.4 F (36.9 C) (Oral)   Resp 16   SpO2 99%  Vital signs in last 24 hours: Temp:  [98.4 F (36.9 C)-98.6 F (37 C)] 98.4 F (36.9 C) (10/26 0057) Pulse Rate:  [25-93] 80 (10/26 0057) Resp:  [14-22] 16 (10/26 0057) BP: (115-175)/(47-103) 144/75 (10/26 0057) SpO2:  [97 %-100 %] 99 % (10/26 0057)   Physical Exam  Constitutional: Appears well-developed and well-nourished.  Psych: Affect appropriate to situation Eyes: No scleral injection HENT: No OP obstruction MSK: no joint deformities.  Cardiovascular: Normal rate and regular rhythm.  Respiratory: Effort normal, non-labored breathing GI: Soft.  No distension. There is no tenderness.  Skin: WDI  Neuro: Mental Status: Patient is awake, alert,  oriented to person, place, month, year,  Cranial Nerves: II: She has significant impairment in vision to the right, I suspect some mild nasal field cut out of the left eye as well.?  Confabulation when giving answers.. Pupils are equal, round, and reactive to light.  III,IV, VI: EOMI without ptosis or diploplia.  V: Facial sensation is symmetric to temperature VII: Facial movement is symmetric.  VIII: hearing is intact to voice X: Uvula elevates symmetrically XI: Shoulder shrug is symmetric. XII: tongue is midline without atrophy or fasciculations.  Motor: Tone is normal. Bulk is normal.  She has no pronator drift, but to confrontation appears to have some mild right arm extension weakness.  She gives poor effort in bilateral lower extremities. Sensory: Sensation is symmetric to light touch and temperature in the arms and legs. Cerebellar: Able to perform FNF after bringing her finger to my finger/    I have reviewed labs in epic and the results pertinent to this consultation are: UA - nitrite positive UTI  I have reviewed the images obtained: MRI brain-multifocal infarcts at the cortex in the left MCA distribution.  Impression: 86 year old female with previous cerebellar strokes last month now with anterior circulation strokes.  Given that these are both most consistent with embolic appearance, I strongly suspect cardioembolic events as etiology.  Temporal arteritis could also be contributing.  Though they could be playing some role, I suspect that the urinary tract infection has more to do with her encephalopathy then the strokes.  Recommendations: 1) echo, telemetry 2) no need to repeat CTA at this time 3) continue home prednisone 4) continue home aspirin and Plavix 5) stroke team to follow   Roland Rack, MD [facility] [phone]  If 7pm- 7am, please page neurology on call as listed in [place].

## 2020-12-14 NOTE — Evaluation (Signed)
Physical Therapy Evaluation Patient Details Name: Jenna Long MRN: [id number] DOB: 01/13/1935 Today's Date: 12/14/2020  History of Present Illness  Pt is an 86 y/o female who presents with increased confusion and decreased vision. MRI revealed acute to subacute infarcts in the L frontal and parietal lobes. Pt with recent admission in September 2022 for posterior circulation strikes and R eye visual loss. Discharge from inpatient rehab 10/8 to home at a supervision level with the rollator. Additional PMH significant for DM II, HTN, orthostatic hypotension.   Clinical Impression  Pt admitted with above diagnosis. Pt currently with functional limitations due to the deficits listed below (see PT Problem List). At the time of PT eval pt was able to perform transfers and ambulation with up to min assist for walker management, balance support and safety - mainly due to low vision. Pt reports she has been managing fairly well at home in her familiar environment, and requires somewhat more assist in the less familiar layout of her daughter's home. It appears pt has had a functional decline since d/c from inpatient rehab on 10/8, however still feel she should be able to manage at home with daughter's support and HHPT to follow up. Acutely, pt will benefit from skilled PT to increase their independence and safety with mobility to allow discharge to the venue listed below.          Recommendations for follow up therapy are one component of a multi-disciplinary discharge planning process, led by the attending physician.  Recommendations may be updated based on patient status, additional functional criteria and insurance authorization.  Follow Up Recommendations Home health PT    Assistance Recommended at Discharge Frequent or constant Supervision/Assistance  Functional Status Assessment Patient has had a recent decline in their functional status and demonstrates the ability to make significant improvements in  function in a reasonable and predictable amount of time.  Equipment Recommendations  None recommended by PT    Recommendations for Other Services       Precautions / Restrictions Precautions Precautions: Fall Precaution Comments: low vision Restrictions Weight Bearing Restrictions: No      Mobility  Bed Mobility               General bed mobility comments: Pt was received ambulating in the room with OT    Transfers Overall transfer level: Needs assistance Equipment used: Rolling walker (2 wheels) Transfers: Sit to/from Stand Sit to Stand: Supervision           General transfer comment: Pt demonstrated proper hand placement on seated surface for safety with sit>stand. Required cues to reach back for arm rests of chair before initiating stand>sit. Controlled descent to chair.    Ambulation/Gait Ambulation/Gait assistance: Min assist Gait Distance (Feet): 200 Feet Assistive device: Rolling walker (2 wheels) Gait Pattern/deviations: Step-through pattern;Decreased stride length;Trunk flexed Gait velocity: Decreased Gait velocity interpretation: <1.31 ft/sec, indicative of household ambulator General Gait Details: Multimodal cues due to low vision. Occasional assist for walker management, and 1 instance where heavy min assist was provided due to posterior LOB. During this time, pt was static standing, and therapist had cued her to improve posture and look ahead. When pt raised head, she lost her balance.  Stairs            Wheelchair Mobility    Modified Rankin (Stroke Patients Only) Modified Rankin (Stroke Patients Only) Pre-Morbid Rankin Score: Moderately severe disability Modified Rankin: Moderately severe disability     Balance Overall balance assessment: Needs  assistance Sitting-balance support: Feet supported;No upper extremity supported Sitting balance-Leahy Scale: Fair     Standing balance support: Reliant on assistive device for  balance Standing balance-Leahy Scale: Poor                               Pertinent Vitals/Pain Pain Assessment: No/denies pain    Home Living Family/patient expects to be discharged to:: Private residence Living Arrangements: Alone Available Help at Discharge: Family;Available 24 hours/day Type of Home: House Home Access: Stairs to enter Entrance Stairs-Rails: Right;Left;Can reach both Entrance Stairs-Number of Steps: 3; pt reporting no steps in front   Home Layout: One level Home Equipment: Rolling Walker (2 wheels);BSC;Cane - single point;Shower seat;Wheelchair - manual;Grab bars - tub/shower      Prior Function Prior Level of Function : Needs assist             Mobility Comments: Pt reports she can manage around her home well with the RW, however needs a little more assist at her daughter's house due to less familiar layout and low vision.       Hand Dominance   Dominant Hand: Right    Extremity/Trunk Assessment   Upper Extremity Assessment Upper Extremity Assessment: Defer to OT evaluation    Lower Extremity Assessment Lower Extremity Assessment: Generalized weakness;RLE deficits/detail RLE Deficits / Details: >3/5 in quads, hamstrings, hip flexors bilaterally. Decreased ankle DF on the R and coordination with swing through. RLE Sensation: WNL    Cervical / Trunk Assessment Cervical / Trunk Assessment: Other exceptions Cervical / Trunk Exceptions: forward head posture with rounded shoulders  Communication   Communication: HOH  Cognition Arousal/Alertness: Awake/alert Behavior During Therapy: WFL for tasks assessed/performed Overall Cognitive Status: No family/caregiver present to determine baseline cognitive functioning                                          General Comments      Exercises     Assessment/Plan    PT Assessment Patient needs continued PT services  PT Problem List Decreased strength;Decreased activity  tolerance;Decreased balance;Decreased mobility;Decreased cognition;Decreased coordination;Decreased knowledge of use of DME;Decreased knowledge of precautions;Decreased safety awareness       PT Treatment Interventions DME instruction;Gait training;Functional mobility training;Stair training;Therapeutic activities;Therapeutic exercise;Neuromuscular re-education;Patient/family education    PT Goals (Current goals can be found in the Care Plan section)  Acute Rehab PT Goals Patient Stated Goal: Return home with daughter PT Goal Formulation: With patient Time For Goal Achievement: 12/21/20 Potential to Achieve Goals: Good    Frequency Min 4X/week   Barriers to discharge        Co-evaluation               AM-PAC PT "6 Clicks" Mobility  Outcome Measure Help needed turning from your back to your side while in a flat bed without using bedrails?: None Help needed moving from lying on your back to sitting on the side of a flat bed without using bedrails?: A Little Help needed moving to and from a bed to a chair (including a wheelchair)?: A Little Help needed standing up from a chair using your arms (e.g., wheelchair or bedside chair)?: A Little Help needed to walk in hospital room?: A Little Help needed climbing 3-5 steps with a railing? : A Little 6 Click Score: 19  End of Session Equipment Utilized During Treatment: Gait belt Activity Tolerance: Patient tolerated treatment well Patient left: in chair;with call bell/phone within reach;with chair alarm set Nurse Communication: Mobility status;Other (comment) (NT - bed stripped and sanitized as soaked in urine. Needs sheets) PT Visit Diagnosis: Unsteadiness on feet (R26.81);Other symptoms and signs involving the nervous system (R29.898)    Time: 7001-7494 PT Time Calculation (min) (ACUTE ONLY): 21 min   Charges:   PT Evaluation $PT Eval Moderate Complexity: 1 Mod          Rolinda Roan, PT, DPT Acute Rehabilitation  Services Pager: [phone] Office: [phone]   Thelma Comp 12/14/2020, 3:57 PM

## 2020-12-14 NOTE — Assessment & Plan Note (Signed)
•   No clinical evidence of cardiogenic volume overload ° °

## 2020-12-14 NOTE — Evaluation (Signed)
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 ([place]) 12/13/2020   Mixed diabetic hyperlipidemia associated with type 2 diabetes mellitus ([place]) 12/13/2020   Acute cystitis without hematuria 12/13/2020   Chronic diastolic CHF (congestive heart failure) ([place]) 12/13/2020   H/O temporal arteritis 11/18/2020   Vision loss of right eye    Dyslipidemia    Type 2 diabetes mellitus with hyperglycemia, with long-term current use of insulin (HCC)    Cerebral thrombosis with cerebral infarction 11/09/2020   Temporal arteritis ([place]) 11/08/2020   Diabetes mellitus type 2 in nonobese ([place]) 11/08/2020   DNR (do not resuscitate) 11/08/2020   Left displaced femoral neck fracture ([place]) 02/18/2020   Essential hypertension 02/18/2020   Mixed hyperlipidemia 02/18/2020   Fall    Past Medical History:  Past Medical History:  Diagnosis Date   Diabetes mellitus type II    DNR (do not resuscitate) 11/08/2020   Hypercholesteremia    Hypertension    Past Surgical History:  Past Surgical History:  Procedure Laterality Date   ABDOMINAL HYSTERECTOMY  1991   [facility], [place]   left, [place]   ARTERY BIOPSY Right 11/09/2020   Procedure: BIOPSY TEMPORAL ARTERY;  Surgeon: Cherre Robins, MD;  Location: [facility];  Service: Vascular;  Laterality: Right;   CATARACT EXTRACTION W/ INTRAOCULAR LENS IMPLANT  06/10/06    Right, [facility], [place]   CATARACT EXTRACTION W/PHACO  09/18/2010   Procedure: CATARACT EXTRACTION PHACO AND INTRAOCULAR LENS PLACEMENT ([place]);  Surgeon: Williams Che;  Location: [facility];  Service: Ophthalmology;  Laterality: Left;   COLONOSCOPY W/ POLYPECTOMY  06/20/10   [facility], [place]   HIP ARTHROPLASTY Left 02/18/2020    Procedure: ARTHROPLASTY BIPOLAR HIP (HEMIARTHROPLASTY);  Surgeon: Mordecai Rasmussen, MD;  Location: [facility];  Service: Orthopedics;  Laterality: Left;   HPI:  86yo female admitted 12/13/20 with right vision loss, hypergloycemia, AMS. PMH: HTN, HLD, DM, temporal arteritis. Adm 9/19-30/22, CIR 9/30-10/7/22. MRI = 2 acute infarcts in R cerebellar hemisphere   Assessment / Plan / Recommendation Clinical Impression  Portions of the [place] Mental Status Examination (SLUMS) were administered. Unable to give subtests requiring vision due to bilateral visual impairment (pt reports her visual acuity "comes and goes").  Pt scored 13/24 points, with deficits identified in the following areas: immediate and delayed recall, mental math, thought organization, digit reversal, and auditory attention and recall. Pt reports prior to admit, she was living alone, managing all household responsibilities including finances and medications. She indicates she plans to move in with her daughter and son in law, and has already discussed having her daughter take over financial management.   Pt's performance on this assessment raise concern for moderate neurocognitive deficits. Home health speech therapy is recommended to facilitate establishment of routines, and determine compensatory strategies within pt's living environment.    SLP Assessment  SLP Recommendation/Assessment: All further Speech Language Pathology  needs can be addressed in the next venue of care  SLP Visit Diagnosis: Attention and concentration deficit  Attention and concentration deficit following: Other cerebrovascular disease (MRI = 2 acute infarcts in R cerebellar hemisphere)    Recommendations for follow up therapy are one component of a multi-disciplinary discharge planning process, led by  the attending physician.  Recommendations may be updated based on patient status, additional functional criteria and insurance authorization.     Follow Up Recommendations  Home health SLP          SLP Evaluation Cognition  Overall Cognitive Status: No family/caregiver present to determine baseline cognitive functioning Arousal/Alertness: Awake/alert Orientation Level: Oriented X4 Year: 2022 Month: October Day of Week: Correct       Comprehension  Auditory Comprehension Overall Auditory Comprehension: Appears within functional limits for tasks assessed Reading Comprehension Reading Status: Unable to assess (comment) (due to visual impairment)    Expression Expression Primary Mode of Expression: Verbal Verbal Expression Overall Verbal Expression: Appears within functional limits for tasks assessed Written Expression Dominant Hand: Right Written Expression: Unable to assess (comment) (due to visual impairment)   Oral / Motor  Oral Motor/Sensory Function Overall Oral Motor/Sensory Function: Within functional limits Motor Speech Overall Motor Speech: Appears within functional limits for tasks assessed Articulation: Within functional limitis Intelligibility: Intelligible   GO                   Jozey Janco B. Quentin Ore, [facility], [place] Speech Language Pathologist Office: [phone]  Shonna Chock 12/14/2020, 9:58 AM

## 2020-12-14 NOTE — Assessment & Plan Note (Signed)
   Substantial hyperglycemia likely secondary to ongoing steroid use and possible noncompliance Patient been placed on Accu-Cheks before every meal and nightly with sliding scale insulin Placing patient back on home regimen of basal insulin therapy Hemoglobin A1C ordered Diabetic Diet

## 2020-12-14 NOTE — Progress Notes (Signed)
PROGRESS NOTE    Jenna Long  VQQ:[id number] DOB: 04-Nov-1934 DOA: 12/13/2020 PCP: Sharilyn Sites, MD  Brief Narrative: 86/F with history of type 2 diabetes mellitus, hypertension, dyslipidemia was presented to [facility] on 9/19 with right-sided vision loss, on work-up she was noted to have 2 infarcts in the right cerebellar hemisphere and old infarcts, MRI orbits noted abnormal enhancement along the optic nerve sheaths bilaterally compatible with bilateral optic nerve perineuritis, also noted to have significantly elevated ESR and CRP, underwent temporal artery biopsy on 9/21 which was concerning for temporal arteritis, treated with high-dose of IV Solu-Medrol and subsequently on long course of prednisone with neurology/rheumatology follow-up. Presented back to [facility] ED on 10/25 with confusion, hyperglycemia and visual disturbances -MRI brain noted multifocal infarcts in the left frontal and parietal lobes  Assessment & Plan:   Acute CVA Recent strokes -Etiology not clear yet, cardioembolic is a possibility -Recent temporal arteritis also confounding current presentation -Neurology consulted and following -2D echo last month noted EF of 70-75% -CTA neck in September noted severe intracranial atherosclerosis -Continue aspirin,, Plavix changed to Brilinta  Right temporal arteritis -Continue prolonged prednisone taper -Patient also reports symptoms of vision loss in left eye as well -Repeat ESR  Hyperglycemia Type 2 diabetes mellitus -Continue Semglee and NovoLog 3 times daily -CBGs are stable, monitor  Possible UTI -Continue IV ceftriaxone, follow-up urine culture  Chronic diastolic CHF -Clinically euvolemic, diuretics on hold  Hypertension -Permissive hypertension, antihypertensives on hold at this time  DVT prophylaxis: Lovenox Code Status: DNR Family Communication:, Discussed with patient in detail, no family at bedside Disposition Plan:  Status is: Inpatient  Remains  inpatient appropriate because: Acute CVA, severity of illness    Consultants:  Neurology  Procedures:   Antimicrobials:    Subjective: -Feels better today, reports that her mind is clearer, ongoing visual issues  Objective: Vitals:   12/14/20 0300 12/14/20 0500 12/14/20 0752 12/14/20 1153  BP: (!) 143/59 (!) 142/58 (!) 150/59 (!) 158/90  Pulse:   (!) 46 (!) 47  Resp: '16 19 20 16  ' Temp: 97.7 F (36.5 C) 97.9 F (36.6 C) 97.8 F (36.6 C) 98.3 F (36.8 C)  TempSrc: Oral Oral Oral Oral  SpO2: 100% 100% 100% 100%  Weight:      Height:        Intake/Output Summary (Last 24 hours) at 12/14/2020 1443 Last data filed at 12/14/2020 0616 Gross per 24 hour  Intake 837.13 ml  Output --  Net 837.13 ml   Filed Weights   12/14/20 0057  Weight: 58.8 kg    Examination:  General exam: Appears calm and comfortable  Respiratory system: Clear to auscultation Cardiovascular system: S1 & S2 heard, RRR.  Abd: nondistended, soft and nontender.Normal bowel sounds heard. Central nervous system: Alert and oriented. No focal neurological deficits. Extremities: Sno edema Skin: No rashes Psychiatry: Judgement and insight appear normal. Mood & affect appropriate.     Data Reviewed:   CBC: Recent Labs  Lab 12/13/20 1329 12/13/20 1338 12/14/20 0331  WBC  --  6.0 5.8  NEUTROABS  --  3.6 3.3  HGB 12.9  12.2 12.5 11.9*  HCT 38.0  36.0 38.4 35.2*  MCV  --  91.2 89.8  PLT  --  308 433   Basic Metabolic Panel: Recent Labs  Lab 12/13/20 1329 12/13/20 1338 12/14/20 0331  NA 133*  133* 131* 135  K 3.9  3.9 3.8 3.3*  CL 100 97* 103  CO2  --  23  24  GLUCOSE 439* 439* 235*  BUN '17 17 10  ' CREATININE 0.80 0.97 0.75  CALCIUM  --  9.2 8.6*  MG  --  2.1 1.8   GFR: Estimated Creatinine Clearance: 48.6 mL/min (by C-G formula based on SCr of 0.75 mg/dL). Liver Function Tests: Recent Labs  Lab 12/13/20 1338 12/14/20 0331  AST 15 14*  ALT 22 19  ALKPHOS 76 68  BILITOT 0.8  0.8  PROT 6.5 5.4*  ALBUMIN 3.3* 2.7*   No results for input(s): LIPASE, AMYLASE in the last 168 hours. No results for input(s): AMMONIA in the last 168 hours. Coagulation Profile: Recent Labs  Lab 12/13/20 1338  INR 1.0   Cardiac Enzymes: No results for input(s): CKTOTAL, CKMB, CKMBINDEX, TROPONINI in the last 168 hours. BNP (last 3 results) No results for input(s): PROBNP in the last 8760 hours. HbA1C: Recent Labs    12/14/20 0331  HGBA1C 7.5*   CBG: Recent Labs  Lab 12/13/20 1306 12/13/20 1526 12/14/20 0335 12/14/20 0628 12/14/20 1151  GLUCAP 405* 326* 212* 175* 163*   Lipid Profile: Recent Labs    12/14/20 0331  CHOL 185  HDL 65  LDLCALC 86  TRIG 172*  CHOLHDL 2.8   Thyroid Function Tests: No results for input(s): TSH, T4TOTAL, FREET4, T3FREE, THYROIDAB in the last 72 hours. Anemia Panel: No results for input(s): VITAMINB12, FOLATE, FERRITIN, TIBC, IRON, RETICCTPCT in the last 72 hours. Urine analysis:    Component Value Date/Time   COLORURINE YELLOW 12/13/2020 1630   APPEARANCEUR CLOUDY (A) 12/13/2020 1630   LABSPEC 1.015 12/13/2020 1630   PHURINE 5.0 12/13/2020 1630   GLUCOSEU >=500 (A) 12/13/2020 1630   HGBUR NEGATIVE 12/13/2020 1630   BILIRUBINUR NEGATIVE 12/13/2020 1630   KETONESUR NEGATIVE 12/13/2020 1630   PROTEINUR NEGATIVE 12/13/2020 1630   NITRITE POSITIVE (A) 12/13/2020 1630   LEUKOCYTESUR MODERATE (A) 12/13/2020 1630   Sepsis Labs: '@LABRCNTIP' (procalcitonin:4,lacticidven:4)  ) Recent Results (from the past 240 hour(s))  Resp Panel by RT-PCR (Flu A&B, Covid) Nasopharyngeal Swab     Status: None   Collection Time: 12/13/20  1:15 PM   Specimen: Nasopharyngeal Swab; Nasopharyngeal(NP) swabs in vial transport medium  Result Value Ref Range Status   SARS Coronavirus 2 by RT PCR NEGATIVE NEGATIVE Final    Comment: (NOTE) SARS-CoV-2 target nucleic acids are NOT DETECTED.  The SARS-CoV-2 RNA is generally detectable in upper  respiratory specimens during the acute phase of infection. The lowest concentration of SARS-CoV-2 viral copies this assay can detect is 138 copies/mL. A negative result does not preclude SARS-Cov-2 infection and should not be used as the sole basis for treatment or other patient management decisions. A negative result may occur with  improper specimen collection/handling, submission of specimen other than nasopharyngeal swab, presence of viral mutation(s) within the areas targeted by this assay, and inadequate number of viral copies(<138 copies/mL). A negative result must be combined with clinical observations, patient history, and epidemiological information. The expected result is Negative.  Fact Sheet for Patients:  EntrepreneurPulse.com.au  Fact Sheet for Healthcare Providers:  IncredibleEmployment.be  This test is no t yet approved or cleared by the [place] FDA and  has been authorized for detection and/or diagnosis of SARS-CoV-2 by FDA under an Emergency Use Authorization (EUA). This EUA will remain  in effect (meaning this test can be used) for the duration of the COVID-19 declaration under Section 564(b)(1) of the Act, 21 U.S.C.section 360bbb-3(b)(1), unless the authorization is terminated  or revoked sooner.  Influenza A by PCR NEGATIVE NEGATIVE Final   Influenza B by PCR NEGATIVE NEGATIVE Final    Comment: (NOTE) The Xpert Xpress SARS-CoV-2/FLU/RSV plus assay is intended as an aid in the diagnosis of influenza from Nasopharyngeal swab specimens and should not be used as a sole basis for treatment. Nasal washings and aspirates are unacceptable for Xpert Xpress SARS-CoV-2/FLU/RSV testing.  Fact Sheet for Patients: EntrepreneurPulse.com.au  Fact Sheet for Healthcare Providers: IncredibleEmployment.be  This test is not yet approved or cleared by the [place] FDA and has been  authorized for detection and/or diagnosis of SARS-CoV-2 by FDA under an Emergency Use Authorization (EUA). This EUA will remain in effect (meaning this test can be used) for the duration of the COVID-19 declaration under Section 564(b)(1) of the Act, 21 U.S.C. section 360bbb-3(b)(1), unless the authorization is terminated or revoked.  Performed at [facility], [place] [address] [place], [place] [address]          Radiology Studies: DG Chest 2 View  Result Date: 12/13/2020 CLINICAL DATA:  Weakness EXAM: CHEST - 2 VIEW COMPARISON:  Chest x-ray 06/27/2020 FINDINGS: Heart size and mediastinum appear stable and within normal limits. No focal consolidation identified. No pleural effusion or pneumothorax. Bones are osteopenic. IMPRESSION: No acute intrathoracic process identified. Electronically Signed   By: Ofilia Neas   On: 12/13/2020 14:17   MR Brain W and Wo Contrast  Result Date: 12/13/2020 CLINICAL DATA:  Neuro deficit, acute, stroke suspected vision changes and confusion, hx of recent stroke and temporal arteritis EXAM: MRI HEAD WITHOUT AND WITH CONTRAST TECHNIQUE: Multiplanar, multiecho pulse sequences of the brain and surrounding structures were obtained without and with intravenous contrast. CONTRAST:  49m GADAVIST GADOBUTROL 1 MMOL/ML IV SOLN COMPARISON:  11/08/2020 FINDINGS: Brain: Small focus of mildly reduced diffusion in the posterior left frontal lobe. Probable small foci of mild cortical diffusion hyperintensity in the left parietal lobe. There is no evidence of intracranial hemorrhage. There is no intracranial mass or mass effect. There is no hydrocephalus or extra-axial fluid collection. Prominence of the ventricles and sulci reflects stable parenchymal volume loss. Patchy and confluent areas of T2 hyperintensity in the supratentorial and pontine white matter are nonspecific but probably reflect stable chronic microvascular ischemic changes. A few superimposed chronic  small vessel infarcts again noted. No abnormal enhancement. Vascular: Major vessel flow voids at the skull base are stable in appearance with diminished right vertebral flow void. Skull and upper cervical spine: Normal marrow signal is preserved. Sinuses/Orbits: Paranasal sinuses are aerated. Orbits are unremarkable. Other: Sella is partially empty.  Mastoid air cells are clear. IMPRESSION: Few small acute to subacute infarcts in the left frontal and parietal lobes. Stable chronic microvascular ischemic changes. Electronically Signed   By: PMacy MisM.D.   On: 12/13/2020 18:59        Scheduled Meds:  aspirin EC  81 mg Oral Daily   atorvastatin  40 mg Oral Daily   dorzolamide  1 drop Both Eyes BID   enoxaparin (LOVENOX) injection  40 mg Subcutaneous Daily   insulin aspart  0-15 Units Subcutaneous TID AC & HS   insulin aspart  4 Units Subcutaneous Once   insulin glargine-yfgn  12 Units Subcutaneous QHS   predniSONE  40 mg Oral Q breakfast   ticagrelor  90 mg Oral BID   Continuous Infusions:  sodium chloride 1,000 mL (12/14/20 0409)   cefTRIAXone (ROCEPHIN)  IV       LOS: 1 day  Time spent: 39mn    PDomenic Polite MD [facility]   12/14/2020, 2:43 PM

## 2020-12-14 NOTE — Assessment & Plan Note (Signed)
.   Holding patient's home regimen of oral antihypertensives for now until given clearance by neurology . PRN intravenous antihypertensives for excessively elevated blood pressure in excess of 220/115

## 2020-12-15 ENCOUNTER — Inpatient Hospital Stay (HOSPITAL_COMMUNITY): Payer: PPO

## 2020-12-15 ENCOUNTER — Other Ambulatory Visit (HOSPITAL_COMMUNITY): Payer: Self-pay

## 2020-12-15 DIAGNOSIS — I639 Cerebral infarction, unspecified: Secondary | ICD-10-CM | POA: Diagnosis not present

## 2020-12-15 DIAGNOSIS — Z8673 Personal history of transient ischemic attack (TIA), and cerebral infarction without residual deficits: Secondary | ICD-10-CM

## 2020-12-15 LAB — CBC
HCT: 36.9 % (ref 36.0–46.0)
Hemoglobin: 12.4 g/dL (ref 12.0–15.0)
MCH: 30 pg (ref 26.0–34.0)
MCHC: 33.6 g/dL (ref 30.0–36.0)
MCV: 89.1 fL (ref 80.0–100.0)
Platelets: 276 10*3/uL (ref 150–400)
RBC: 4.14 MIL/uL (ref 3.87–5.11)
RDW: 18.6 % — ABNORMAL HIGH (ref 11.5–15.5)
WBC: 6.1 10*3/uL (ref 4.0–10.5)
nRBC: 0 % (ref 0.0–0.2)

## 2020-12-15 LAB — BASIC METABOLIC PANEL
Anion gap: 10 (ref 5–15)
BUN: 12 mg/dL (ref 8–23)
CO2: 23 mmol/L (ref 22–32)
Calcium: 8.9 mg/dL (ref 8.9–10.3)
Chloride: 104 mmol/L (ref 98–111)
Creatinine, Ser: 0.68 mg/dL (ref 0.44–1.00)
GFR, Estimated: >60 mL/min (ref >60)
Glucose, Bld: 99 mg/dL (ref 70–99)
Potassium: 3.1 mmol/L — ABNORMAL LOW (ref 3.5–5.1)
Sodium: 137 mmol/L (ref 135–145)

## 2020-12-15 LAB — GLUCOSE, CAPILLARY
Glucose-Capillary: 122 mg/dL — ABNORMAL HIGH (ref 70–99)
Glucose-Capillary: 234 mg/dL — ABNORMAL HIGH (ref 70–99)
Glucose-Capillary: 220 mg/dL — ABNORMAL HIGH (ref 70–99)
Glucose-Capillary: 137 mg/dL — ABNORMAL HIGH (ref 70–99)

## 2020-12-15 LAB — SEDIMENTATION RATE: Sed Rate: 12 mm/hr (ref 0–22)

## 2020-12-15 MED ORDER — METOPROLOL SUCCINATE ER 25 MG PO TB24
25.0000 mg | ORAL_TABLET | Freq: Every day | ORAL | Status: DC
  Administered 2020-12-15 – 2020-12-16 (×2): 25 mg via ORAL
  Filled 2020-12-15 (×2): qty 1

## 2020-12-15 MED ORDER — POTASSIUM CHLORIDE CRYS ER 20 MEQ PO TBCR
40.0000 meq | EXTENDED_RELEASE_TABLET | Freq: Once | ORAL | Status: AC
  Administered 2020-12-15: 40 meq via ORAL
  Filled 2020-12-15: qty 2

## 2020-12-15 MED ORDER — INSULIN STARTER KIT- SYRINGES (ENGLISH)
1.0000 | Freq: Once | Status: AC
  Administered 2020-12-15: 1
  Filled 2020-12-15: qty 1

## 2020-12-15 NOTE — Evaluation (Signed)
Physical Therapy Evaluation Patient Details Name: Jenna Long MRN: [id number] DOB: Feb 10, 1935 Today's Date: 12/15/2020  History of Present Illness  Pt is an 86 y/o female who presents with increased confusion and decreased vision. MRI revealed acute to subacute infarcts in the L frontal and parietal lobes. Pt with recent admission in September 2022 for posterior circulation strikes and R eye visual loss. Discharge from inpatient rehab 10/8 to home at a supervision level with the rollator. Additional PMH significant for DM II, HTN, orthostatic hypotension.   Clinical Impression  Pt progressing towards physical therapy goals. Was able to perform transfers and ambulation with gross min assist and RW for support. Pt continues to provide ambiguous answers to PLOF questions. Will need close supervision for OOB mobility at d/c due to low vision. Will continue to follow and progress as able per POC.    Recommendations for follow up therapy are one component of a multi-disciplinary discharge planning process, led by the attending physician.  Recommendations may be updated based on patient status, additional functional criteria and insurance authorization.  Follow Up Recommendations Home health PT    Assistance Recommended at Discharge Frequent or constant Supervision/Assistance  Functional Status Assessment    Equipment Recommendations  None recommended by PT    Recommendations for Other Services       Precautions / Restrictions Precautions Precautions: Fall Precaution Comments: low vision Restrictions Weight Bearing Restrictions: No      Mobility  Bed Mobility Overal bed mobility: Needs Assistance Bed Mobility: Supine to Sit;Sit to Supine     Supine to sit: Modified independent (Device/Increase time) Sit to supine: Min assist   General bed mobility comments: Pt with difficulty controlling trunk for return to supine. Pt attempted to bring LE's up into bed first from sitting EOB,  however let trunk fall back onto the bed. Assist required for safe transition to supine.    Transfers Overall transfer level: Needs assistance Equipment used: Rolling walker (2 wheels) Transfers: Sit to/from Stand Sit to Stand: Min guard           General transfer comment: Hands on guarding as pt powered up to full stand. She was able to control descent down to EOB without assist.    Ambulation/Gait Ambulation/Gait assistance: Min assist Gait Distance (Feet): 250 Feet Assistive device: Rolling walker (2 wheels) Gait Pattern/deviations: Step-through pattern;Decreased stride length;Trunk flexed Gait velocity: Decreased Gait velocity interpretation: <1.8 ft/sec, indicate of risk for recurrent falls General Gait Details: Multimodal cues due to low vision. Occasional assist for walker management. No overt LOB noted.  Stairs            Wheelchair Mobility    Modified Rankin (Stroke Patients Only) Modified Rankin (Stroke Patients Only) Pre-Morbid Rankin Score: Moderately severe disability Modified Rankin: Moderately severe disability     Balance Overall balance assessment: Needs assistance Sitting-balance support: Feet supported;No upper extremity supported Sitting balance-Leahy Scale: Fair     Standing balance support: Reliant on assistive device for balance Standing balance-Leahy Scale: Poor                               Pertinent Vitals/Pain Pain Assessment: No/denies pain    Home Living                          Prior Function  Hand Dominance        Extremity/Trunk Assessment                Communication      Cognition Arousal/Alertness: Awake/alert Behavior During Therapy: WFL for tasks assessed/performed Overall Cognitive Status: No family/caregiver present to determine baseline cognitive functioning                                          General Comments       Exercises     Assessment/Plan    PT Assessment    PT Problem List         PT Treatment Interventions      PT Goals (Current goals can be found in the Care Plan section)  Acute Rehab PT Goals Patient Stated Goal: Return home with daughter PT Goal Formulation: With patient Time For Goal Achievement: 12/21/20 Potential to Achieve Goals: Good    Frequency Min 4X/week   Barriers to discharge        Co-evaluation               AM-PAC PT "6 Clicks" Mobility  Outcome Measure Help needed turning from your back to your side while in a flat bed without using bedrails?: None Help needed moving from lying on your back to sitting on the side of a flat bed without using bedrails?: A Little Help needed moving to and from a bed to a chair (including a wheelchair)?: A Little Help needed standing up from a chair using your arms (e.g., wheelchair or bedside chair)?: A Little Help needed to walk in hospital room?: A Little Help needed climbing 3-5 steps with a railing? : A Little 6 Click Score: 19    End of Session Equipment Utilized During Treatment: Gait belt Activity Tolerance: Patient tolerated treatment well Patient left: in chair;with call bell/phone within reach;with chair alarm set Nurse Communication: Mobility status;Other (comment) (NT - bed stripped and sanitized as soaked in urine. Needs sheets) PT Visit Diagnosis: Unsteadiness on feet (R26.81);Other symptoms and signs involving the nervous system (R29.898)    Time: 7116-5790 PT Time Calculation (min) (ACUTE ONLY): 23 min   Charges:     PT Treatments $Gait Training: 23-37 mins        Rolinda Roan, PT, DPT Acute Rehabilitation Services Pager: [phone] Office: [phone]   Thelma Comp 12/15/2020, 3:50 PM

## 2020-12-15 NOTE — Progress Notes (Addendum)
PROGRESS NOTE    ROLONDA PONTARELLI  BTY:[id number] DOB: 1934/06/11 DOA: 12/13/2020 PCP: Sharilyn Sites, MD  Brief Narrative: 86/F with history of type 2 diabetes mellitus, hypertension, dyslipidemia was presented to [facility] on 9/19 with right-sided vision loss, on work-up she was noted to have 2 infarcts in the right cerebellar hemisphere and old infarcts, MRI orbits noted abnormal enhancement along the optic nerve sheaths bilaterally compatible with bilateral optic nerve perineuritis, also noted to have significantly elevated ESR and CRP, underwent temporal artery biopsy on 9/21 which was concerning for temporal arteritis, treated with high-dose of IV Solu-Medrol and subsequently on long course of prednisone with neurology/rheumatology follow-up. Presented back to [facility] ED on 10/25 with confusion, hyperglycemia and visual disturbances -MRI brain noted multifocal infarcts in the left frontal and parietal lobes  Assessment & Plan:   Acute CVA Recent strokes -Etiology not clear yet, cardioembolic is a possibility -MRI noted Few small acute to subacute infarcts in the left frontal and parietal lobes. -Recent temporal arteritis also confounding current presentation -Neurology consulted and following -2D echo last month noted EF of 70-75% -CTA neck in September noted severe intracranial atherosclerosis -Continue aspirin,  Plavix changed to Brilinta -PT OT eval completed, home health recommended -Will need close ophthalmology follow-up after discharge  Right temporal arteritis -Continue prolonged prednisone taper -Patient also reports symptoms of vision loss in left eye as well -Repeat ESR is 12 compared to 111 from a month ago, rules out active temporal arteritis any longer  SVT -Multiple episodes, will add low-dose beta-blocker -Monitor on telemetry  Hyperglycemia Type 2 diabetes mellitus -Continue Semglee and NovoLog 3 times daily -Increased dose -Insulin teaching, will need to discharge home  with insulin -Diabetes coordinator following  Possible UTI -Continue IV ceftriaxone, urine culture pending  Chronic diastolic CHF -Clinically euvolemic, diuretics on hold  Hypertension -Permissive hypertension, antihypertensives on hold at this time  DVT prophylaxis: Lovenox Code Status: DNR Family Communication:, Discussed with patient in detail, no family at bedside Disposition Plan:  Status is: Inpatient  Remains inpatient appropriate because: Acute CVA, severity of illness    Consultants:  Neurology  Procedures:   Antimicrobials:    Subjective: -Feels better today, denies any specific complaints  Objective: Vitals:   12/14/20 2331 12/15/20 0408 12/15/20 0744 12/15/20 1120  BP: (!) 177/60 (!) 163/64 (!) 158/62 135/79  Pulse: 70 79 60 60  Resp: 16 18 16    Temp: 98.2 F (36.8 C) 97.8 F (36.6 C) 98.1 F (36.7 C)   TempSrc: Oral Oral Oral   SpO2: 100% 100% 100%   Weight:      Height:        Intake/Output Summary (Last 24 hours) at 12/15/2020 1235 Last data filed at 12/15/2020 0900 Gross per 24 hour  Intake 120 ml  Output 375 ml  Net -255 ml   Filed Weights   12/14/20 0057  Weight: 58.8 kg    Examination:  Gen: Awake, Alert, Oriented X 3,  HEENT: no JVD, bilateral visual deficits noted Lungs: Good air movement bilaterally, CTAB CVS: S1S2/RRR Abd: soft, Non tender, non distended, BS present Extremities: No edema Skin: no new rashes on exposed skin  Psychiatry: Judgement and insight appear normal. Mood & affect appropriate.     Data Reviewed:   CBC: Recent Labs  Lab 12/13/20 1329 12/13/20 1338 12/14/20 0331 12/15/20 0641  WBC  --  6.0 5.8 6.1  NEUTROABS  --  3.6 3.3  --   HGB 12.9  12.2 12.5 11.9* 12.4  HCT 38.0  36.0 38.4 35.2* 36.9  MCV  --  91.2 89.8 89.1  PLT  --  308 238 062   Basic Metabolic Panel: Recent Labs  Lab 12/13/20 1329 12/13/20 1338 12/14/20 0331 12/15/20 0641  NA 133*  133* 131* 135 137  K 3.9  3.9 3.8  3.3* 3.1*  CL 100 97* 103 104  CO2  --  23 24 23   GLUCOSE 439* 439* 235* 99  BUN 17 17 10 12   CREATININE 0.80 0.97 0.75 0.68  CALCIUM  --  9.2 8.6* 8.9  MG  --  2.1 1.8  --    GFR: Estimated Creatinine Clearance: 48.6 mL/min (by C-G formula based on SCr of 0.68 mg/dL). Liver Function Tests: Recent Labs  Lab 12/13/20 1338 12/14/20 0331  AST 15 14*  ALT 22 19  ALKPHOS 76 68  BILITOT 0.8 0.8  PROT 6.5 5.4*  ALBUMIN 3.3* 2.7*   No results for input(s): LIPASE, AMYLASE in the last 168 hours. No results for input(s): AMMONIA in the last 168 hours. Coagulation Profile: Recent Labs  Lab 12/13/20 1338  INR 1.0   Cardiac Enzymes: No results for input(s): CKTOTAL, CKMB, CKMBINDEX, TROPONINI in the last 168 hours. BNP (last 3 results) No results for input(s): PROBNP in the last 8760 hours. HbA1C: Recent Labs    12/14/20 0331  HGBA1C 7.5*   CBG: Recent Labs  Lab 12/14/20 1151 12/14/20 1554 12/14/20 2146 12/15/20 0635 12/15/20 1226  GLUCAP 163* 311* 131* 122* 234*   Lipid Profile: Recent Labs    12/14/20 0331  CHOL 185  HDL 65  LDLCALC 86  TRIG 172*  CHOLHDL 2.8   Thyroid Function Tests: No results for input(s): TSH, T4TOTAL, FREET4, T3FREE, THYROIDAB in the last 72 hours. Anemia Panel: No results for input(s): VITAMINB12, FOLATE, FERRITIN, TIBC, IRON, RETICCTPCT in the last 72 hours. Urine analysis:    Component Value Date/Time   COLORURINE YELLOW 12/13/2020 1630   APPEARANCEUR CLOUDY (A) 12/13/2020 1630   LABSPEC 1.015 12/13/2020 1630   PHURINE 5.0 12/13/2020 1630   GLUCOSEU >=500 (A) 12/13/2020 1630   HGBUR NEGATIVE 12/13/2020 1630   BILIRUBINUR NEGATIVE 12/13/2020 1630   KETONESUR NEGATIVE 12/13/2020 1630   PROTEINUR NEGATIVE 12/13/2020 1630   NITRITE POSITIVE (A) 12/13/2020 1630   LEUKOCYTESUR MODERATE (A) 12/13/2020 1630   Sepsis Labs: @LABRCNTIP (procalcitonin:4,lacticidven:4)  ) Recent Results (from the past 240 hour(s))  Resp Panel by  RT-PCR (Flu A&B, Covid) Nasopharyngeal Swab     Status: None   Collection Time: 12/13/20  1:15 PM   Specimen: Nasopharyngeal Swab; Nasopharyngeal(NP) swabs in vial transport medium  Result Value Ref Range Status   SARS Coronavirus 2 by RT PCR NEGATIVE NEGATIVE Final    Comment: (NOTE) SARS-CoV-2 target nucleic acids are NOT DETECTED.  The SARS-CoV-2 RNA is generally detectable in upper respiratory specimens during the acute phase of infection. The lowest concentration of SARS-CoV-2 viral copies this assay can detect is 138 copies/mL. A negative result does not preclude SARS-Cov-2 infection and should not be used as the sole basis for treatment or other patient management decisions. A negative result may occur with  improper specimen collection/handling, submission of specimen other than nasopharyngeal swab, presence of viral mutation(s) within the areas targeted by this assay, and inadequate number of viral copies(<138 copies/mL). A negative result must be combined with clinical observations, patient history, and epidemiological information. The expected result is Negative.  Fact Sheet for Patients:  EntrepreneurPulse.com.au  Fact Sheet for Healthcare Providers:  IncredibleEmployment.be  This test is no t yet approved or cleared by the [place] and  has been authorized for detection and/or diagnosis of SARS-CoV-2 by FDA under an Emergency Use Authorization (EUA). This EUA will remain  in effect (meaning this test can be used) for the duration of the COVID-19 declaration under Section 564(b)(1) of the Act, 21 U.S.C.section 360bbb-3(b)(1), unless the authorization is terminated  or revoked sooner.       Influenza A by PCR NEGATIVE NEGATIVE Final   Influenza B by PCR NEGATIVE NEGATIVE Final    Comment: (NOTE) The Xpert Xpress SARS-CoV-2/FLU/RSV plus assay is intended as an aid in the diagnosis of influenza from Nasopharyngeal swab  specimens and should not be used as a sole basis for treatment. Nasal washings and aspirates are unacceptable for Xpert Xpress SARS-CoV-2/FLU/RSV testing.  Fact Sheet for Patients: EntrepreneurPulse.com.au  Fact Sheet for Healthcare Providers: IncredibleEmployment.be  This test is not yet approved or cleared by the [place] FDA and has been authorized for detection and/or diagnosis of SARS-CoV-2 by FDA under an Emergency Use Authorization (EUA). This EUA will remain in effect (meaning this test can be used) for the duration of the COVID-19 declaration under Section 564(b)(1) of the Act, 21 U.S.C. section 360bbb-3(b)(1), unless the authorization is terminated or revoked.  Performed at [facility], [place] [address] [place], [place] [address]          Radiology Studies: DG Chest 2 View  Result Date: 12/13/2020 CLINICAL DATA:  Weakness EXAM: CHEST - 2 VIEW COMPARISON:  Chest x-ray 06/27/2020 FINDINGS: Heart size and mediastinum appear stable and within normal limits. No focal consolidation identified. No pleural effusion or pneumothorax. Bones are osteopenic. IMPRESSION: No acute intrathoracic process identified. Electronically Signed   By: Ofilia Neas   On: 12/13/2020 14:17   MR Brain W and Wo Contrast  Result Date: 12/13/2020 CLINICAL DATA:  Neuro deficit, acute, stroke suspected vision changes and confusion, hx of recent stroke and temporal arteritis EXAM: MRI HEAD WITHOUT AND WITH CONTRAST TECHNIQUE: Multiplanar, multiecho pulse sequences of the brain and surrounding structures were obtained without and with intravenous contrast. CONTRAST:  34m GADAVIST GADOBUTROL 1 MMOL/ML IV SOLN COMPARISON:  11/08/2020 FINDINGS: Brain: Small focus of mildly reduced diffusion in the posterior left frontal lobe. Probable small foci of mild cortical diffusion hyperintensity in the left parietal lobe. There is no evidence of intracranial  hemorrhage. There is no intracranial mass or mass effect. There is no hydrocephalus or extra-axial fluid collection. Prominence of the ventricles and sulci reflects stable parenchymal volume loss. Patchy and confluent areas of T2 hyperintensity in the supratentorial and pontine white matter are nonspecific but probably reflect stable chronic microvascular ischemic changes. A few superimposed chronic small vessel infarcts again noted. No abnormal enhancement. Vascular: Major vessel flow voids at the skull base are stable in appearance with diminished right vertebral flow void. Skull and upper cervical spine: Normal marrow signal is preserved. Sinuses/Orbits: Paranasal sinuses are aerated. Orbits are unremarkable. Other: Sella is partially empty.  Mastoid air cells are clear. IMPRESSION: Few small acute to subacute infarcts in the left frontal and parietal lobes. Stable chronic microvascular ischemic changes. Electronically Signed   By: PMacy MisM.D.   On: 12/13/2020 18:59   ECHOCARDIOGRAM COMPLETE BUBBLE STUDY  Result Date: 12/14/2020    ECHOCARDIOGRAM REPORT   Patient Name:   BKLARE CRISSDate of Exam: 12/14/2020 Medical Rec #:  [id number]     Height:  67.0 in Accession #:    [id number]     Weight:       129.6 lb Date of Birth:  06/15/1934       BSA:          1.682 m Patient Age:    64 years       BP:           142/58 mmHg Patient Gender: F              HR:           87 bpm. Exam Location:  Inpatient Procedure: 2D Echo, Cardiac Doppler and Color Doppler Indications:    I50.33 Acute on chronic diastolic (congestive) heart failure;                 I10 Hypertension  History:        Patient has prior history of Echocardiogram examinations, most                 recent 11/10/2020. Stroke.  Sonographer:    Glo Herring Referring Phys: [id number] [place]  1. Left ventricular ejection fraction, by estimation, is 65 to 70%. The left ventricle has normal function. The left ventricle has no  regional wall motion abnormalities. There is mild concentric left ventricular hypertrophy. Left ventricular diastolic parameters are consistent with Grade I diastolic dysfunction (impaired relaxation).  2. Right ventricular systolic function is normal. The right ventricular size is normal.  3. The mitral valve is normal in structure. No evidence of mitral valve regurgitation. No evidence of mitral stenosis.  4. The aortic valve is tricuspid. There is moderate calcification of the aortic valve. Aortic valve regurgitation is not visualized. No aortic stenosis is present.  5. The inferior vena cava is normal in size with greater than 50% respiratory variability, suggesting right atrial pressure of 3 mmHg.  6. Agitated saline contrast bubble study was negative, with no evidence of any interatrial shunt. FINDINGS  Left Ventricle: Left ventricular ejection fraction, by estimation, is 65 to 70%. The left ventricle has normal function. The left ventricle has no regional wall motion abnormalities. The left ventricular internal cavity size was normal in size. There is  mild concentric left ventricular hypertrophy. Left ventricular diastolic parameters are consistent with Grade I diastolic dysfunction (impaired relaxation). Right Ventricle: The right ventricular size is normal. No increase in right ventricular wall thickness. Right ventricular systolic function is normal. Left Atrium: Left atrial size was not well visualized. Right Atrium: Right atrial size was not well visualized. Pericardium: There is no evidence of pericardial effusion. Mitral Valve: The mitral valve is normal in structure. There is mild calcification of the mitral valve leaflet(s). Mild mitral annular calcification. No evidence of mitral valve regurgitation. No evidence of mitral valve stenosis. Tricuspid Valve: The tricuspid valve is normal in structure. Tricuspid valve regurgitation is trivial. No evidence of tricuspid stenosis. Aortic Valve: The aortic  valve is tricuspid. There is moderate calcification of the aortic valve. Aortic valve regurgitation is not visualized. No aortic stenosis is present. Pulmonic Valve: The pulmonic valve was normal in structure. Pulmonic valve regurgitation is not visualized. No evidence of pulmonic stenosis. Aorta: The aortic root is normal in size and structure. Venous: The inferior vena cava is normal in size with greater than 50% respiratory variability, suggesting right atrial pressure of 3 mmHg. IAS/Shunts: No atrial level shunt detected by color flow Doppler. Agitated saline contrast was given intravenously to evaluate for intracardiac shunting. Agitated saline  contrast bubble study was negative, with no evidence of any interatrial shunt.  LEFT VENTRICLE PLAX 2D LVIDd:         4.20 cm Diastology LVIDs:         2.50 cm LV e' medial:    4.13 cm/s LV PW:         1.20 cm LV E/e' medial:  17.3 LV IVS:        1.30 cm LV e' lateral:   6.53 cm/s                        LV E/e' lateral: 11.0  IVC IVC diam: 1.40 cm LEFT ATRIUM         Index LA diam:    2.70 cm 1.61 cm/m   AORTA Ao Root diam: 3.30 cm MITRAL VALVE MV Area (PHT): 3.03 cm MV Decel Time: 250 msec MV E velocity: 71.55 cm/s MV A velocity: 92.10 cm/s MV E/A ratio:  0.78 Glori Bickers MD Electronically signed by Glori Bickers MD Signature Date/Time: 12/14/2020/3:23:35 PM    Final         Scheduled Meds:  aspirin EC  81 mg Oral Daily   atorvastatin  40 mg Oral Daily   dorzolamide  1 drop Both Eyes BID   enoxaparin (LOVENOX) injection  40 mg Subcutaneous Daily   insulin aspart  0-15 Units Subcutaneous TID AC & HS   insulin aspart  4 Units Subcutaneous Once   insulin glargine-yfgn  12 Units Subcutaneous QHS   insulin starter kit- syringes  1 kit Other Once   metoprolol succinate  25 mg Oral Daily   predniSONE  40 mg Oral Q breakfast   ticagrelor  90 mg Oral BID   Continuous Infusions:  cefTRIAXone (ROCEPHIN)  IV 1 g (12/14/20 2123)     LOS: 2 days     Time spent: 72mn    PDomenic Polite MD [facility]   12/15/2020, 12:35 PM

## 2020-12-15 NOTE — Progress Notes (Signed)
Occupational Therapy Treatment Patient Details Name: Jenna Long MRN: [id number] DOB: 10/15/1934 Today's Date: 12/15/2020   History of present illness Pt is an 86 y/o female who presents with increased confusion and decreased vision. MRI revealed acute to subacute infarcts in the L frontal and parietal lobes. Pt with recent admission in September 2022 for posterior circulation strikes and R eye visual loss. Discharge from inpatient rehab 10/8 to home at a supervision level with the rollator. Additional PMH significant for DM II, HTN, orthostatic hypotension.   OT comments  Pt continues to demonstrate increased visual deficits. 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 OT will continue to follow acutely.    Recommendations for follow up therapy are one component of a multi-disciplinary discharge planning process, led by the attending physician.  Recommendations may be updated based on patient status, additional functional criteria and insurance authorization.    Follow Up Recommendations  Home health OT    Assistance Recommended at Discharge Frequent or constant Supervision/Assistance  Equipment Recommendations  None recommended by OT    Recommendations for Other Services      Precautions / Restrictions Precautions Precautions: Fall Precaution Comments: low vision Restrictions Weight Bearing Restrictions: No       Mobility Bed Mobility Overal bed mobility: Modified Independent Bed Mobility: Supine to Sit;Sit to Supine     Supine to sit: Modified independent (Device/Increase time) Sit to supine: Modified independent (Device/Increase time)   General bed mobility comments: No difficulties, use of railing    Transfers Overall transfer  level: Needs assistance Equipment used: Rolling walker (2 wheels) Transfers: Sit to/from Stand Sit to Stand: Min guard           General transfer comment: Hands on guarding as pt powered up to full stand. She was able to control descent down to EOB without assist.     Balance Overall balance assessment: Needs assistance Sitting-balance support: Feet supported;No upper extremity supported Sitting balance-Leahy Scale: Fair     Standing balance support: Reliant on assistive device for balance Standing balance-Leahy Scale: Poor                             ADL either performed or assessed with clinical judgement   ADL Overall ADL's : Needs assistance/impaired Eating/Feeding: Moderate assistance;Sitting Eating/Feeding Details (indicate cue type and reason): Pt requiring a feeder due to difficulty with sight, pt became frustrated quickly, asking to order her a sandwich instead. Grooming: Minimal assistance;Standing Grooming Details (indicate cue type and reason): Pt completed a item finding/identifying task to complete grooming at the sink. Pt requiring increased verbal cuing to find objects and fatigued quickly.                 Toilet Transfer: Min guard;Ambulation Toilet Transfer Details (indicate cue type and reason): Pt requiring hands on guiding and verbal cuing in order to safely ambulate in her environment.         Functional mobility during ADLs: Min guard;Rolling walker (2 wheels) General ADL Comments: Pt needing increased verbal cuing and tactile cuing due to decreased vision.     Vision       Perception     Praxis  Cognition Arousal/Alertness: Awake/alert Behavior During Therapy: WFL for tasks assessed/performed Overall Cognitive Status: No family/caregiver present to determine baseline cognitive functioning                                            Exercises     Shoulder Instructions       General Comments       Pertinent Vitals/ Pain       Pain Assessment: No/denies pain  Home Living                                          Prior Functioning/Environment              Frequency  Min 2X/week        Progress Toward Goals  OT Goals(current goals can now be found in the care plan section)  Progress towards OT goals: Progressing toward goals  Acute Rehab OT Goals Patient Stated Goal: To go home OT Goal Formulation: With patient Time For Goal Achievement: 12/28/20 Potential to Achieve Goals: Good ADL Goals Pt Will Perform Eating: with set-up;sitting Pt Will Perform Grooming: with set-up;standing Pt Will Perform Upper Body Dressing: with set-up;sitting Pt Will Perform Lower Body Dressing: with supervision;sitting/lateral leans;sit to/from stand Pt Will Transfer to Toilet: with supervision;ambulating Pt Will Perform Toileting - Clothing Manipulation and hygiene: with supervision;sitting/lateral leans;sit to/from stand Additional ADL Goal #1: Pt will remember and utilize 3 low vision strategies when completeing ADL's.  Plan Discharge plan remains appropriate;Frequency remains appropriate    Co-evaluation                 AM-PAC OT "6 Clicks" Daily Activity     Outcome Measure   Help from another person eating meals?: A Lot Help from another person taking care of personal grooming?: A Little Help from another person toileting, which includes using toliet, bedpan, or urinal?: A Little Help from another person bathing (including washing, rinsing, drying)?: A Little Help from another person to put on and taking off regular upper body clothing?: A Little Help from another person to put on and taking off regular lower body clothing?: A Little 6 Click Score: 17    End of Session Equipment Utilized During Treatment: Gait belt;Rolling walker (2 wheels)  OT Visit Diagnosis: Unsteadiness on feet (R26.81);Low vision, both eyes (H54.2);Muscle weakness  (generalized) (M62.81)   Activity Tolerance Patient tolerated treatment well   Patient Left in bed;with call bell/phone within reach;with bed alarm set   Nurse Communication Mobility status        Time: 7824-2353 OT Time Calculation (min): 13 min  Charges: OT General Charges $OT Visit: 1 Visit OT Treatments $Therapeutic Activity: 8-22 mins  Jenna Dobesh H., Jenna Long Acute Rehabilitation  Jenna Long 12/15/2020, 5:55 PM

## 2020-12-15 NOTE — TOC Benefit Eligibility Note (Signed)
Patient Advocate Encounter ° °Insurance verification completed.   ° °The patient is currently admitted and upon discharge could be taking Brilinta 90 mg. ° °The current 30 day co-pay is, $45.00.  ° °The patient is insured through [employer] Part D  ° ° ° °Nesha Counihan, CPhT °Pharmacy Patient Advocate Specialist °[facility] Patient Advocate Team °Direct Number: [phone]  Fax: [phone] ° ° ° ° ° °  °

## 2020-12-15 NOTE — TOC Initial Note (Signed)
Transition of Care [facility]) - Initial/Assessment Note    Patient Details  Name: Jenna Long MRN: [id number] Date of Birth: 1934-06-01  Transition of Care [facility]) CM/SW Contact:    Pollie Friar, RN Phone Number: 12/15/2020, 1:39 PM  Clinical Narrative:                 Patient has been at home alone but is going to move in with her daughter and SIL. Daughter is currently moving patients belongings to her home.  Recommendations are for [facility] services. Pt has no preference on the home health agency after provided choice with [email or web address] CM will arrange home health services prior to d/c.  Pt states her daughter provides all needed transportation.  Pt denies issues with home medications and states she can afford the co pay for the Brilinta. TOC following.   Expected Discharge Plan: [place] Barriers to Discharge: Continued Medical Work up   Patient Goals and CMS Choice   CMS Medicare.gov Compare Post Acute Care list provided to:: Patient Choice offered to / list presented to : Patient  Expected Discharge Plan and Services Expected Discharge Plan: [place]   Discharge Planning Services: CM Consult Post Acute Care Choice: [place] arrangements for the past 2 months: Single Family Home                           HH Arranged: PT, OT          Prior Living Arrangements/Services Living arrangements for the past 2 months: Single Family Home Lives with:: Adult Children Patient language and need for interpreter reviewed:: Yes Do you feel safe going back to the place where you live?: Yes      Need for Family Participation in Patient Care: Yes (Comment) Care giver support system in place?: Yes (comment) Current home services: DME (walker/ cane/ shower seat/ wheelchair) Criminal Activity/Legal Involvement Pertinent to Current Situation/Hospitalization: No - Comment as needed  Activities of Daily Living Home Assistive  Devices/Equipment: Eyeglasses, Environmental consultant (specify type) ADL Screening (condition at time of admission) Patient's cognitive ability adequate to safely complete daily activities?: Yes Is the patient deaf or have difficulty hearing?: No Does the patient have difficulty seeing, even when wearing glasses/contacts?: Yes Does the patient have difficulty concentrating, remembering, or making decisions?: No Patient able to express need for assistance with ADLs?: Yes Does the patient have difficulty dressing or bathing?: No Independently performs ADLs?: No Communication: Independent Is this a change from baseline?: Pre-admission baseline Dressing (OT): Needs assistance Is this a change from baseline?: Pre-admission baseline Grooming: Needs assistance Is this a change from baseline?: Change from baseline, expected to last >3 days Feeding: Needs assistance Is this a change from baseline?: Change from baseline, expected to last >3 days Bathing: Needs assistance Is this a change from baseline?: Change from baseline, expected to last >3 days Toileting: Needs assistance Is this a change from baseline?: Change from baseline, expected to last >3days In/Out Bed: Needs assistance Is this a change from baseline?: Change from baseline, expected to last >3 days Walks in Home: Independent with device (comment) (Cane) Is this a change from baseline?: Pre-admission baseline Does the patient have difficulty walking or climbing stairs?: No Weakness of Legs: None Weakness of Arms/Hands: Both  Permission Sought/Granted                  Emotional Assessment Appearance:: Appears stated age Attitude/Demeanor/Rapport: Engaged  Affect (typically observed): Accepting Orientation: : Oriented to Self, Oriented to Place, Oriented to  Time, Oriented to Situation   Psych Involvement: No (comment)  Admission diagnosis:  Hyperglycemia [R73.9] 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 [facility])  [I63.522] Patient Active Problem List   Diagnosis Date Noted   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 ([place]) 12/13/2020   Mixed diabetic hyperlipidemia associated with type 2 diabetes mellitus ([place]) 12/13/2020   Acute cystitis without hematuria 12/13/2020   Chronic diastolic CHF (congestive heart failure) ([place]) 12/13/2020   H/O temporal arteritis 11/18/2020   Vision loss of right eye    Dyslipidemia    Type 2 diabetes mellitus with hyperglycemia, with long-term current use of insulin ([place])    Cerebral thrombosis with cerebral infarction 11/09/2020   Temporal arteritis ([place]) 11/08/2020   Diabetes mellitus type 2 in nonobese ([place]) 11/08/2020   DNR (do not resuscitate) 11/08/2020   Left displaced femoral neck fracture ([place]) 02/18/2020   Essential hypertension 02/18/2020   Mixed hyperlipidemia 02/18/2020   Fall    PCP:  Sharilyn Sites, MD Pharmacy:   [place], [place] [address] [place]  [address] Phone: [phone] Fax: [phone]     Social Determinants of Health (SDOH) Interventions    Readmission Risk Interventions No flowsheet data found.

## 2020-12-15 NOTE — Progress Notes (Addendum)
STROKE TEAM PROGRESS NOTE   ATTENDING NOTE: I reviewed above note and agree with the assessment and plan. Pt was seen and examined.   86 year old female with history of diabetes, hypertension, hyperlipidemia, recent temporal arteritis on steroids admitted for confusion, and vision difficulty.  Currently, patient denies any headache.  On examination, patient awake, alert, pleasant, orientated to place and age but not to time.  No aphasia, fluent language, follows simple commands, able to name and repeat.  Right eye blind, left eye bilateral lower quadrant vision loss.  However able to see left upper quadrant and partial right upper quadrant.  Left eye vision decreased from admission last month.  Otherwise, facial symmetrical, bilateral upper and lower extremity equal strength sensation and coordination.  In 10/2020, patient admitted for headache and right vision loss.  ESR 111, CRP 8.6.  Temporal artery ultrasound showed right temporal artery hyperecholic halo sign, consistent with temporal arteritis.  Temporal artery biopsy also confirmed temporal arteritis.  Treated with 3-day Solu-Medrol followed by 60 mg prednisone daily.  Currently she is on 40 mg prednisone daily.  ESR repeat 12.  Patient denies any headache.  However, compared with last admission, this time patient has disorientation and loss of left eye vision on both lower quadrants, concerning for progression of temporal arteritis.  Recommend close ophthalmology follow-up and outpatient rheumatology follow-up.  Recommend to continue prednisone at this time.  At last admission in 10/2020, patient MRI also showed right cerebellum 2 punctate infarcts, chronic left cerebral white matter and bilateral BG and thalamic lacunar infarct.  CTA head and neck basilar artery, bilateral P2, distal M1, proximal M2, left A2 and right VA severe stenosis.  A1c 6.3, LDL 100.  EF 70 to 75%, stroke likely due to severe intracranial stenosis including right VA.   Discharged on aspirin 325 and Plavix 75 DAPT for 3 months and then aspirin alone.  Changed pravastatin to Lipitor 40. Before discharge, patient had orthostatic hypotension with syncope on 9/23 and 9/26, put on TED hose. While in CIR, pt also had episode of low BP.   On this admission, MRI showed left frontal 2-3 punctate infarcts.  EF 65 to 70%.  A1c 7.5, LDL 86.  UDS negative.  Creatinine 0.75.  LE venous Doppler negative for DVT.  Patient was put on aspirin 81 and Brilinta 90 twice daily.  Etiology for current punctate infarcts likely due to intracranial stenosis in the setting of possible orthostatic hypotension.  Recommend continue aspirin 81 and Brilinta 90 mg twice daily for 1 months and then Plavix alone.  Continue Lipitor 40.  Avoid low BP, continue TED hose.  Recommend 30-day CardioNet monitor as outpatient to rule out A. fib.  For detailed assessment and plan, please refer to above as I have made changes wherever appropriate.   Neurology will sign off. Please call with questions. Pt will follow up with stroke clinic Dr. Leonie Man at [facility] in about 4 weeks. Thanks for the consult.  [name], MD PhD Stroke Neurology 12/15/2020 7:15 PM  I spent  35 minutes in total face-to-face time with the patient, more than 50% of which was spent in counseling and coordination of care, reviewing test results, images and medication, and discussing the diagnosis, treatment plan and potential prognosis. This patient's care requiresreview of multiple databases, neurological assessment, discussion with family, other specialists and medical decision making of high complexity.      INTERVAL HISTORY Patient is seen in room with no family present.  She has been hemodynamically  stable and is in no acute distress.  She reports that she had a good night.  She does not appear to detect persons entering her room until her name is called.  Vitals:   12/14/20 2017 12/14/20 2331 12/15/20 0408 12/15/20 0744  BP: (!)  136/98 (!) 177/60 (!) 163/64 (!) 158/62  Pulse: 83 70 79 60  Resp: '16 16 18 16  ' Temp: 98.2 F (36.8 C) 98.2 F (36.8 C) 97.8 F (36.6 C) 98.1 F (36.7 C)  TempSrc: Oral Oral Oral Oral  SpO2: 100% 100% 100% 100%  Weight:      Height:       CBC:  Recent Labs  Lab 12/13/20 1338 12/14/20 0331 12/15/20 0641  WBC 6.0 5.8 6.1  NEUTROABS 3.6 3.3  --   HGB 12.5 11.9* 12.4  HCT 38.4 35.2* 36.9  MCV 91.2 89.8 89.1  PLT 308 238 376    Basic Metabolic Panel:  Recent Labs  Lab 12/13/20 1338 12/14/20 0331 12/15/20 0641  NA 131* 135 137  K 3.8 3.3* 3.1*  CL 97* 103 104  CO2 '23 24 23  ' GLUCOSE 439* 235* 99  BUN '17 10 12  ' CREATININE 0.97 0.75 0.68  CALCIUM 9.2 8.6* 8.9  MG 2.1 1.8  --     Lipid Panel:  Recent Labs  Lab 12/14/20 0331  CHOL 185  TRIG 172*  HDL 65  CHOLHDL 2.8  VLDL 34  LDLCALC 86    HgbA1c:  Recent Labs  Lab 12/14/20 0331  HGBA1C 7.5*    Urine Drug Screen:  Recent Labs  Lab 12/13/20 1615  LABOPIA NONE DETECTED  COCAINSCRNUR NONE DETECTED  LABBENZ NONE DETECTED  AMPHETMU NONE DETECTED  THCU NONE DETECTED  LABBARB NONE DETECTED     Alcohol Level  Recent Labs  Lab 12/13/20 1339  ETH <10     IMAGING past 24 hours ECHOCARDIOGRAM COMPLETE BUBBLE STUDY  Result Date: 12/14/2020    ECHOCARDIOGRAM REPORT   Patient Name:   CALLYN SEVERTSON Date of Exam: 12/14/2020 Medical Rec #:  [id number]      Height:       67.0 in Accession #:    [id number]     Weight:       129.6 lb Date of Birth:  22-May-1934       BSA:          1.682 m Patient Age:    44 years       BP:           142/58 mmHg Patient Gender: F              HR:           87 bpm. Exam Location:  Inpatient Procedure: 2D Echo, Cardiac Doppler and Color Doppler Indications:    I50.33 Acute on chronic diastolic (congestive) heart failure;                 I10 Hypertension  History:        Patient has prior history of Echocardiogram examinations, most                 recent 11/10/2020. Stroke.   Sonographer:    Glo Herring Referring Phys: [id number] [place]  1. Left ventricular ejection fraction, by estimation, is 65 to 70%. The left ventricle has normal function. The left ventricle has no regional wall motion abnormalities. There is mild concentric left ventricular hypertrophy. Left ventricular diastolic parameters are consistent with  Grade I diastolic dysfunction (impaired relaxation).  2. Right ventricular systolic function is normal. The right ventricular size is normal.  3. The mitral valve is normal in structure. No evidence of mitral valve regurgitation. No evidence of mitral stenosis.  4. The aortic valve is tricuspid. There is moderate calcification of the aortic valve. Aortic valve regurgitation is not visualized. No aortic stenosis is present.  5. The inferior vena cava is normal in size with greater than 50% respiratory variability, suggesting right atrial pressure of 3 mmHg.  6. Agitated saline contrast bubble study was negative, with no evidence of any interatrial shunt. FINDINGS  Left Ventricle: Left ventricular ejection fraction, by estimation, is 65 to 70%. The left ventricle has normal function. The left ventricle has no regional wall motion abnormalities. The left ventricular internal cavity size was normal in size. There is  mild concentric left ventricular hypertrophy. Left ventricular diastolic parameters are consistent with Grade I diastolic dysfunction (impaired relaxation). Right Ventricle: The right ventricular size is normal. No increase in right ventricular wall thickness. Right ventricular systolic function is normal. Left Atrium: Left atrial size was not well visualized. Right Atrium: Right atrial size was not well visualized. Pericardium: There is no evidence of pericardial effusion. Mitral Valve: The mitral valve is normal in structure. There is mild calcification of the mitral valve leaflet(s). Mild mitral annular calcification. No evidence of mitral  valve regurgitation. No evidence of mitral valve stenosis. Tricuspid Valve: The tricuspid valve is normal in structure. Tricuspid valve regurgitation is trivial. No evidence of tricuspid stenosis. Aortic Valve: The aortic valve is tricuspid. There is moderate calcification of the aortic valve. Aortic valve regurgitation is not visualized. No aortic stenosis is present. Pulmonic Valve: The pulmonic valve was normal in structure. Pulmonic valve regurgitation is not visualized. No evidence of pulmonic stenosis. Aorta: The aortic root is normal in size and structure. Venous: The inferior vena cava is normal in size with greater than 50% respiratory variability, suggesting right atrial pressure of 3 mmHg. IAS/Shunts: No atrial level shunt detected by color flow Doppler. Agitated saline contrast was given intravenously to evaluate for intracardiac shunting. Agitated saline contrast bubble study was negative, with no evidence of any interatrial shunt.  LEFT VENTRICLE PLAX 2D LVIDd:         4.20 cm Diastology LVIDs:         2.50 cm LV e' medial:    4.13 cm/s LV PW:         1.20 cm LV E/e' medial:  17.3 LV IVS:        1.30 cm LV e' lateral:   6.53 cm/s                        LV E/e' lateral: 11.0  IVC IVC diam: 1.40 cm LEFT ATRIUM         Index LA diam:    2.70 cm 1.61 cm/m   AORTA Ao Root diam: 3.30 cm MITRAL VALVE MV Area (PHT): 3.03 cm MV Decel Time: 250 msec MV E velocity: 71.55 cm/s MV A velocity: 92.10 cm/s MV E/A ratio:  0.78 Glori Bickers MD Electronically signed by Glori Bickers MD Signature Date/Time: 12/14/2020/3:23:35 PM    Final     PHYSICAL EXAM General: Patient is an alert female in no acute distress.   NEURO:    NEURO:  Mental Status: AA&Ox3  Speech/Language: speech is without dysarthria or aphasia.  Fluency, and comprehension intact.  Patient is unable to  name objects as she cannot see them.  Cranial Nerves:  II: PERRL. Patient is unable to detect raised fingers or hand movement in any  visual fields, although she does guess.  She states that the room appears dark when it is in fact brightly lit III, IV, VI: EOMI, patient able to follow moving finger with prompting. Eyelids elevate symmetrically.  V: Sensation is intact to light touch and symmetrical to face.  VII: Smile is symmetrical. Able to puff cheeks and raise eyebrows.  VIII: hearing intact to voice. IX, X: Phonation is normal.  XII: tongue is midline without fasciculations. Motor: 5/5 strength to all muscle groups tested.  Tone: is normal and bulk is normal Sensation- Intact to light touch bilaterally.  Coordination: .No drift.  Gait- deferred    ASSESSMENT/PLAN Ms. SUEELLEN KAYES is a 86 y.o. female with history of HTN, HLD, DM Type 2 and temporal arteritis presenting with confusion and visual changes. In 9/22, patient was admitted with visual changes and was diagnosed with temporal arteritis and two small right cerebellar infarcts.  She was discharged to CIR and then to home.  She was readmitted with a UTI, confusion and visual changes.  Confusion was most likely caused by her UTI.  May consider outpatient 30 day cardiac monitor or loop recorder to identify atrial fibrillation. Will continue to treat with IV Rocephin.  Patient is more alert and oriented today but continues to have visual deficits.    Stroke:  left acute to subacute infarct likely embolic possibly secondary to cardioembolic source MRI  Few small acute to subacute infarcts in the left frontal and parietal lobes. Stable chronic microvascular ischemic changes 2D Echo 9/22 EF 68-08%, grade 1 diastolic dysfunction, mild LVH, no atrial level shunt LDL 86 HgbA1c 7.5 VTE prophylaxis - lovenox    Diet   Diet heart healthy/carb modified Room service appropriate? Yes; Fluid consistency: Thin   aspirin 81 mg daily and clopidogrel 75 mg daily prior to admission, now on aspirin 81 mg daily and Brilinta (ticagrelor) 90 mg bid.  Therapy recommendations: OT  recommends home OT, PT recommendations pending Disposition:  pending  Hypertension Home meds:  amlodipine 10 mg daily, losartan 25 mg daily Stable Permissive hypertension (OK if < 220/120) but gradually normalize in 5-7 days Long-term BP goal normotensive  Hyperlipidemia Home meds:  atorvastatin 40 mg daily, resumed in hospital LDL 86, goal < 70 High intensity statin continued Continue statin at discharge  Diabetes type II Uncontrolled in setting of steroid use Home meds:  Insulin Glargine 12 units daily, Januvia 100 mg daily HgbA1c 7.5, goal < 7.0 CBGs Recent Labs    12/14/20 1554 12/14/20 2146 12/15/20 0635  GLUCAP 311* 131* 122*    SSI Blood glucose likely elevated due to prednisone use for temporal arteritis.  Will continue to cover elevated blood glucose with SSI and reassess control when steroid taper is complete  Other Stroke Risk Factors Advanced Age >/= 60  Hx stroke/TIA Family hx stroke (mother)  Other Active Problems UTI Continue treatment with Rocephin, end today Temporal Arteritis Continue prednisone taper  Hospital day # 2    To contact Stroke Continuity provider, please refer to [email or web address] After hours, contact General Neurology

## 2020-12-15 NOTE — Progress Notes (Signed)
Per CCMD, pt had several PACs with HR in the 130s and 4-5 bursts of SVT. Domenic Polite, MD notified. See new orders. Will continue to monitor.

## 2020-12-15 NOTE — Progress Notes (Signed)
Lab notified this RN pt's sed rate 12. Domenic Polite, MD aware. Will continue to monitor.

## 2020-12-15 NOTE — Progress Notes (Signed)
Lower extremity venous has been completed.   Preliminary results in CV Proc.   Jinny Blossom Vika Buske 12/15/2020 1:53 PM

## 2020-12-15 NOTE — Plan of Care (Signed)

## 2020-12-16 ENCOUNTER — Other Ambulatory Visit: Payer: Self-pay | Admitting: Physician Assistant

## 2020-12-16 DIAGNOSIS — I63522 Cerebral infarction due to unspecified occlusion or stenosis of left anterior cerebral artery: Secondary | ICD-10-CM

## 2020-12-16 LAB — GLUCOSE, CAPILLARY: Glucose-Capillary: 79 mg/dL (ref 70–99)

## 2020-12-16 MED ORDER — INSULIN GLARGINE 100 UNIT/ML ~~LOC~~ SOLN: 12.0000 [IU] | Freq: Every day | SUBCUTANEOUS | 0 refills | Status: DC

## 2020-12-16 MED ORDER — TICAGRELOR 90 MG PO TABS: 90.0000 mg | ORAL_TABLET | Freq: Two times a day (BID) | ORAL | 0 refills | Status: AC

## 2020-12-16 MED ORDER — CLOPIDOGREL BISULFATE 75 MG PO TABS: ORAL_TABLET | ORAL | 0 refills | Status: AC

## 2020-12-16 MED ORDER — METOPROLOL SUCCINATE ER 25 MG PO TB24: 25.0000 mg | ORAL_TABLET | Freq: Every day | ORAL | 1 refills | Status: AC

## 2020-12-16 MED ORDER — INSULIN GLARGINE 100 UNIT/ML SOLOSTAR PEN: 12.0000 [IU] | PEN_INJECTOR | Freq: Every day | SUBCUTANEOUS | 0 refills | Status: DC

## 2020-12-16 MED ORDER — PREDNISONE 10 MG PO TABS: ORAL_TABLET | ORAL | 1 refills | Status: DC

## 2020-12-16 MED ORDER — "PEN NEEDLES 3/16"" 31G X 5 MM MISC": 12.0000 [IU] | Freq: Every day | 1 refills | Status: DC

## 2020-12-16 MED ORDER — ASPIRIN 81 MG PO TBEC: 81.0000 mg | DELAYED_RELEASE_TABLET | Freq: Every day | ORAL | 0 refills | Status: AC

## 2020-12-16 NOTE — TOC Transition Note (Addendum)
Transition of Care [facility]) - CM/SW Discharge Note   Patient Details  Name: Jenna Long MRN: [id number] Date of Birth: September 24, 1934  [facility] CM/SW Contact:  Pollie Friar, RN Phone Number: 12/16/2020, 10:38 AM   Clinical Narrative:    Patient discharging home with home health services through [place]. [facility] with [facility] accepted and information on the AVS. Pt has needed DME.  CM provided pt with 30 day free card for the brilinta. Daughter to provide needed assistance at home and transportation home today.   Final next level of care: Home w Home Health Services Barriers to Discharge: No Barriers Identified   Patient Goals and CMS Choice   CMS Medicare.gov Compare Post Acute Care list provided to:: Patient Choice offered to / list presented to : Patient  Discharge Placement                       Discharge Plan and Services   Discharge Planning Services: CM Consult Post Acute Care Choice: Home Health                    HH Arranged: PT, OT, Speech Therapy [place] Agency: [place] Date [place]: 12/16/20   Representative spoke with at [place]: [place] ([place]) Interventions     Readmission Risk Interventions No flowsheet data found.

## 2020-12-16 NOTE — Discharge Summary (Signed)
Physician Discharge Summary  Jenna Long MBE:[id number] DOB: December 02, 1934 DOA: 12/13/2020  PCP: Sharilyn Sites, MD  Admit date: 12/13/2020 Discharge date: 12/16/2020  Time spent: 51mnutes  Recommendations for Outpatient Follow-up:  PCP in 1 week, newly started on insulin, please titrate insulin dose down as prednisone is tapered down Need ophthalmology follow-up in 2 weeks Rheumatology follow-up in 2 to 3 weeks [facility] Dr. SLeonie Manin 4 weeks 30-day cardiac monitor recommended by neurology to rule out A. fib   Discharge Diagnoses:  Principal Problem:   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 ([place] Active Problems:   Essential hypertension   Temporal arteritis ([place]   Type 2 diabetes mellitus with hyperglycemia, with long-term current use of insulin ([place]   Mixed diabetic hyperlipidemia associated with type 2 diabetes mellitus ([place]   Acute cystitis without hematuria   Chronic diastolic CHF (congestive heart failure) ([place] Recent diagnosis of right temporal arteritis  Discharge Condition: Stable  Diet recommendation: Carb modified  Filed Weights   12/14/20 0057  Weight: 58.8 kg    History of present illness:  86/F with history of type 2 diabetes mellitus, hypertension, dyslipidemia [facility], on work-up she was noted to have 2 infarcts in the right cerebellar hemisphere and old infarcts, MRI orbits noted abnormal enhancement along the optic nerve sheaths bilaterally compatible with bilateral optic nerve perineuritis, also noted to have significantly elevated ESR and CRP, underwent temporal artery biopsy on 9/21 which was concerning for temporal arteritis, treated with high-dose of IV Solu-Medrol and subsequently on long course of prednisone with neurology/rheumatology follow-up. [facility] confusion, hyperglycemia and visual disturbances -MRI brain noted multifocal infarcts in  the left frontal and parietal lobes  Hospital Course:   Acute CVA Recent strokes -Neurology felt her stroke could be secondary to intracranial stenosis in the setting of possible orthostatic hypotension -MRI noted Few small acute to subacute infarcts in the left frontal and parietal lobes. -Recent temporal arteritis also confounding current presentation -Neurology consulted, recommended to continue aspirin 81 mg and Brilinta 90 mg twice daily for 1 month followed by Plavix alone, recommended to continue Lipitor 40 mg daily, avoid hypotension and 30-day monitor to rule out A. fib -2D echo last month noted EF of 70-75% -PT OT eval completed, home health services were recommended and set up at discharge   Right temporal arteritis -This was recently diagnosed during previous admission, continued on prolonged prednisone taper -Patient also reports symptoms of vision loss in left eye as well -Repeat ESR is 12 compared to 111 from a month ago, rules out active temporal arteritis any longer -Continue slow prednisone taper 40 mg daily for 2 weeks followed by 30 mg daily for 2 weeks, to be gradually tapered by 10 mg every 2 weeks, close follow-up with rheumatology recommended   SVT -Resolved, Toprol resumed   Hyperglycemia Type 2 diabetes mellitus -CBGs greater than 400 at the time of admission, worsened in the setting of recent steroids for temporal arteritis -Started on long-acting insulin, insulin teaching completed, diabetes coordinator discussed with daughter who will help administer insulin daily, discharged home on Lantus pens 12 units daily -Will need insulin dose titrated down as prednisone  dose is tapered -Needs close follow-up with PCP in few weeks   Possible UTI -Treated with 3 days of ceftriaxone IV, urine culture did not get collected unfortunately   Chronic diastolic CHF -Clinically euvolemic, not on diuretics  at baseline   Hypertension -Amlodipine resumed, losartan  discontinued, avoid hypotension in the setting of intracranial stenosis   DVT prophylaxis: Lovenox Code Status: DNR  Discharge Exam: Vitals:   12/16/20 0416 12/16/20 0816  BP: (!) 150/68 (!) 155/58  Pulse: 68 63  Resp: 18 18  Temp: 97.6 F (36.4 C) 97.9 F (36.6 C)  SpO2: 99% 97%    General: AAOx2 Cardiovascular: S1S2/RRR Respiratory: CTAB  Discharge Instructions   Discharge Instructions     Ambulatory referral to Neurology   Complete by: As directed    Follow up with Dr. Leonie Man at [facility] in 4-6 weeks. Too complicated for NP to follow. Thanks.   Diet - low sodium heart healthy   Complete by: As directed    Diet Carb Modified   Complete by: As directed    Discharge instructions   Complete by: As directed    Take aspirin 81 mg daily and Brilinta 90 mg twice daily for 1 month only, after this course has completed restart Plavix 75 mg daily -Monitor blood sugars periodically, insulin dose may need to be lowered as prednisone is slowly tapered, need close follow-up with PCP   Increase activity slowly   Complete by: As directed    No wound care   Complete by: As directed       Allergies as of 12/16/2020   No Known Allergies      Medication List     STOP taking these medications    docusate sodium 100 MG capsule Commonly known as: COLACE   insulin glargine-yfgn 100 UNIT/ML injection Commonly known as: SEMGLEE   losartan 25 MG tablet Commonly known as: COZAAR       TAKE these medications    acetaminophen 325 MG tablet Commonly known as: TYLENOL Take 325-650 mg by mouth every 6 (six) hours as needed for moderate pain or headache.   amLODipine 10 MG tablet Commonly known as: NORVASC Take 1 tablet (10 mg total) by mouth daily.   aspirin 81 MG EC tablet Take 1 tablet (81 mg total) by mouth daily. Take for 30monthonly What changed: additional instructions   atorvastatin 40 MG tablet Commonly known as: LIPITOR Take 1 tablet (40 mg total) by mouth  daily.   clopidogrel 75 MG tablet Commonly known as: PLAVIX To be restarted in 1 month when course of Aspirin and Brillinta has completed Start taking on: January 16, 2021 What changed:  additional instructions These instructions start on January 16, 2021. If you are unsure what to do until then, ask your doctor or other care provider.   dorzolamide 2 % ophthalmic solution Commonly known as: TRUSOPT Place 1 drop into both eyes 2 (two) times daily.   HYDROcodone-acetaminophen 5-325 MG tablet Commonly known as: NORCO/VICODIN Take 1-2 tablets by mouth every 4 (four) hours as needed for moderate pain.   insulin glargine 100 UNIT/ML Solostar Pen Commonly known as: LANTUS Inject 12 Units into the skin daily.   metoprolol succinate 25 MG 24 hr tablet Commonly known as: TOPROL-XL Take 1 tablet (25 mg total) by mouth daily.   multivitamin tablet Take 1 tablet by mouth daily.   Pen Needles 3/16" 31G X 5 MM Misc 12 Units by Does not apply route daily.   predniSONE 10 MG tablet Commonly known as: DELTASONE Take 4 tabs daily x2 weeks then 3 tabs daily x2 weeks then 2 tabs daily x2 weeks then 1 tab daily x2 weeks and stop What changed: additional instructions   sitaGLIPtin  100 MG tablet Commonly known as: JANUVIA Take 100 mg by mouth daily.   ticagrelor 90 MG Tabs tablet Commonly known as: BRILINTA Take 1 tablet (90 mg total) by mouth 2 (two) times daily. Take for 1 month only       No Known Allergies  Follow-up Information     Garvin Fila, MD. Schedule an appointment as soon as possible for a visit in 1 month(s).   Specialties: Neurology, Radiology Why: stroke clinic Contact information: [address] [place] [address] [phone]         Sharilyn Sites, MD. Schedule an appointment as soon as possible for a visit in 1 week(s).   Specialty: Family Medicine Contact information: [address] [place] [address] [phone]          Care, [facility] Follow up.   Specialty: Home Health Services Why: The home health agency will contact you for the first home visit. Contact information: [address] [place] [address] [phone]                  The results of significant diagnostics from this hospitalization (including imaging, microbiology, ancillary and laboratory) are listed below for reference.    Significant Diagnostic Studies: DG Chest 2 View  Result Date: 12/13/2020 CLINICAL DATA:  Weakness EXAM: CHEST - 2 VIEW COMPARISON:  Chest x-ray 06/27/2020 FINDINGS: Heart size and mediastinum appear stable and within normal limits. No focal consolidation identified. No pleural effusion or pneumothorax. Bones are osteopenic. IMPRESSION: No acute intrathoracic process identified. Electronically Signed   By: Ofilia Neas   On: 12/13/2020 14:17   MR Brain W and Wo Contrast  Result Date: 12/13/2020 CLINICAL DATA:  Neuro deficit, acute, stroke suspected vision changes and confusion, hx of recent stroke and temporal arteritis EXAM: MRI HEAD WITHOUT AND WITH CONTRAST TECHNIQUE: Multiplanar, multiecho pulse sequences of the brain and surrounding structures were obtained without and with intravenous contrast. CONTRAST:  28m GADAVIST GADOBUTROL 1 MMOL/ML IV SOLN COMPARISON:  11/08/2020 FINDINGS: Brain: Small focus of mildly reduced diffusion in the posterior left frontal lobe. Probable small foci of mild cortical diffusion hyperintensity in the left parietal lobe. There is no evidence of intracranial hemorrhage. There is no intracranial mass or mass effect. There is no hydrocephalus or extra-axial fluid collection. Prominence of the ventricles and sulci reflects stable parenchymal volume loss. Patchy and confluent areas of T2 hyperintensity in the supratentorial and pontine white matter are nonspecific but probably reflect stable chronic microvascular ischemic changes. A few superimposed chronic  small vessel infarcts again noted. No abnormal enhancement. Vascular: Major vessel flow voids at the skull base are stable in appearance with diminished right vertebral flow void. Skull and upper cervical spine: Normal marrow signal is preserved. Sinuses/Orbits: Paranasal sinuses are aerated. Orbits are unremarkable. Other: Sella is partially empty.  Mastoid air cells are clear. IMPRESSION: Few small acute to subacute infarcts in the left frontal and parietal lobes. Stable chronic microvascular ischemic changes. Electronically Signed   By: PMacy MisM.D.   On: 12/13/2020 18:59   ECHOCARDIOGRAM COMPLETE BUBBLE STUDY  Result Date: 12/14/2020    ECHOCARDIOGRAM REPORT   Patient Name:   Jenna BOTZDate of Exam: 12/14/2020 Medical Rec #:  [id number]     Height:       67.0 in Accession #:    [id number]    Weight:       129.6 lb Date of Birth:  507-13-1936  BSA:          1.682 m Patient Age:    47 years       BP:           142/58 mmHg Patient Gender: F              HR:           87 bpm. Exam Location:  Inpatient Procedure: 2D Echo, Cardiac Doppler and Color Doppler Indications:    I50.33 Acute on chronic diastolic (congestive) heart failure;                 I10 Hypertension  History:        Patient has prior history of Echocardiogram examinations, most                 recent 11/10/2020. Stroke.  Sonographer:    Glo Herring Referring Phys: [id number] [place]  1. Left ventricular ejection fraction, by estimation, is 65 to 70%. The left ventricle has normal function. The left ventricle has no regional wall motion abnormalities. There is mild concentric left ventricular hypertrophy. Left ventricular diastolic parameters are consistent with Grade I diastolic dysfunction (impaired relaxation).  2. Right ventricular systolic function is normal. The right ventricular size is normal.  3. The mitral valve is normal in structure. No evidence of mitral valve regurgitation. No evidence of mitral  stenosis.  4. The aortic valve is tricuspid. There is moderate calcification of the aortic valve. Aortic valve regurgitation is not visualized. No aortic stenosis is present.  5. The inferior vena cava is normal in size with greater than 50% respiratory variability, suggesting right atrial pressure of 3 mmHg.  6. Agitated saline contrast bubble study was negative, with no evidence of any interatrial shunt. FINDINGS  Left Ventricle: Left ventricular ejection fraction, by estimation, is 65 to 70%. The left ventricle has normal function. The left ventricle has no regional wall motion abnormalities. The left ventricular internal cavity size was normal in size. There is  mild concentric left ventricular hypertrophy. Left ventricular diastolic parameters are consistent with Grade I diastolic dysfunction (impaired relaxation). Right Ventricle: The right ventricular size is normal. No increase in right ventricular wall thickness. Right ventricular systolic function is normal. Left Atrium: Left atrial size was not well visualized. Right Atrium: Right atrial size was not well visualized. Pericardium: There is no evidence of pericardial effusion. Mitral Valve: The mitral valve is normal in structure. There is mild calcification of the mitral valve leaflet(s). Mild mitral annular calcification. No evidence of mitral valve regurgitation. No evidence of mitral valve stenosis. Tricuspid Valve: The tricuspid valve is normal in structure. Tricuspid valve regurgitation is trivial. No evidence of tricuspid stenosis. Aortic Valve: The aortic valve is tricuspid. There is moderate calcification of the aortic valve. Aortic valve regurgitation is not visualized. No aortic stenosis is present. Pulmonic Valve: The pulmonic valve was normal in structure. Pulmonic valve regurgitation is not visualized. No evidence of pulmonic stenosis. Aorta: The aortic root is normal in size and structure. Venous: The inferior vena cava is normal in size with  greater than 50% respiratory variability, suggesting right atrial pressure of 3 mmHg. IAS/Shunts: No atrial level shunt detected by color flow Doppler. Agitated saline contrast was given intravenously to evaluate for intracardiac shunting. Agitated saline contrast bubble study was negative, with no evidence of any interatrial shunt.  LEFT VENTRICLE PLAX 2D LVIDd:         4.20 cm Diastology LVIDs:  2.50 cm LV e' medial:    4.13 cm/s LV PW:         1.20 cm LV E/e' medial:  17.3 LV IVS:        1.30 cm LV e' lateral:   6.53 cm/s                        LV E/e' lateral: 11.0  IVC IVC diam: 1.40 cm LEFT ATRIUM         Index LA diam:    2.70 cm 1.61 cm/m   AORTA Ao Root diam: 3.30 cm MITRAL VALVE MV Area (PHT): 3.03 cm MV Decel Time: 250 msec MV E velocity: 71.55 cm/s MV A velocity: 92.10 cm/s MV E/A ratio:  0.78 Glori Bickers MD Electronically signed by Glori Bickers MD Signature Date/Time: 12/14/2020/3:23:35 PM    Final    VAS [place] LOWER EXTREMITY VENOUS (DVT)  Result Date: 12/15/2020  Lower Venous DVT Study Patient Name:  Jenna Long  Date of Exam:   12/15/2020 Medical Rec #: [id number]       Accession #:    [id number] Date of Birth: [birth year]        Patient Gender: F Patient Age:   67 years Exam Location:  [facility] Procedure:      VAS [place] LOWER EXTREMITY VENOUS (DVT) Referring Phys: Cornelius Moras XU --------------------------------------------------------------------------------  Indications: Stroke.  Comparison Study: no prior Performing Technologist: Archie Patten RVS  Examination Guidelines: A complete evaluation includes B-mode imaging, spectral Doppler, color Doppler, and power Doppler as needed of all accessible portions of each vessel. Bilateral testing is considered an integral part of a complete examination. Limited examinations for reoccurring indications may be performed as noted. The reflux portion of the exam is performed with the patient in reverse Trendelenburg.   +---------+---------------+---------+-----------+----------+--------------+ RIGHT    CompressibilityPhasicitySpontaneityPropertiesThrombus Aging +---------+---------------+---------+-----------+----------+--------------+ CFV      Full           Yes      Yes                                 +---------+---------------+---------+-----------+----------+--------------+ SFJ      Full                                                        +---------+---------------+---------+-----------+----------+--------------+ FV Prox  Full                                                        +---------+---------------+---------+-----------+----------+--------------+ FV Mid   Full                                                        +---------+---------------+---------+-----------+----------+--------------+ FV DistalFull                                                        +---------+---------------+---------+-----------+----------+--------------+  PFV      Full                                                        +---------+---------------+---------+-----------+----------+--------------+ POP      Full           Yes      Yes                                 +---------+---------------+---------+-----------+----------+--------------+ PTV      Full                                                        +---------+---------------+---------+-----------+----------+--------------+ PERO     Full                                                        +---------+---------------+---------+-----------+----------+--------------+   +---------+---------------+---------+-----------+----------+--------------+ LEFT     CompressibilityPhasicitySpontaneityPropertiesThrombus Aging +---------+---------------+---------+-----------+----------+--------------+ CFV      Full           Yes      Yes                                  +---------+---------------+---------+-----------+----------+--------------+ SFJ      Full                                                        +---------+---------------+---------+-----------+----------+--------------+ FV Prox  Full                                                        +---------+---------------+---------+-----------+----------+--------------+ FV Mid   Full                                                        +---------+---------------+---------+-----------+----------+--------------+ FV DistalFull                                                        +---------+---------------+---------+-----------+----------+--------------+ PFV      Full                                                        +---------+---------------+---------+-----------+----------+--------------+  POP      Full           Yes      Yes                                 +---------+---------------+---------+-----------+----------+--------------+ PTV      Full                                                        +---------+---------------+---------+-----------+----------+--------------+ PERO     Full                                                        +---------+---------------+---------+-----------+----------+--------------+     Summary: BILATERAL: - No evidence of deep vein thrombosis seen in the lower extremities, bilaterally. -No evidence of popliteal cyst, bilaterally.   *See table(s) above for measurements and observations. Electronically signed by Orlie Pollen on 12/15/2020 at 4:22:47 PM.    Final     Microbiology: Recent Results (from the past 240 hour(s))  Resp Panel by RT-PCR (Flu A&B, Covid) Nasopharyngeal Swab     Status: None   Collection Time: 12/13/20  1:15 PM   Specimen: Nasopharyngeal Swab; Nasopharyngeal(NP) swabs in vial transport medium  Result Value Ref Range Status   SARS Coronavirus 2 by RT PCR NEGATIVE NEGATIVE Final    Comment:  (NOTE) SARS-CoV-2 target nucleic acids are NOT DETECTED.  The SARS-CoV-2 RNA is generally detectable in upper respiratory specimens during the acute phase of infection. The lowest concentration of SARS-CoV-2 viral copies this assay can detect is 138 copies/mL. A negative result does not preclude SARS-Cov-2 infection and should not be used as the sole basis for treatment or other patient management decisions. A negative result may occur with  improper specimen collection/handling, submission of specimen other than nasopharyngeal swab, presence of viral mutation(s) within the areas targeted by this assay, and inadequate number of viral copies(<138 copies/mL). A negative result must be combined with clinical observations, patient history, and epidemiological information. The expected result is Negative.  Fact Sheet for Patients:  EntrepreneurPulse.com.au  Fact Sheet for Healthcare Providers:  IncredibleEmployment.be  This test is no t yet approved or cleared by the [place] FDA and  has been authorized for detection and/or diagnosis of SARS-CoV-2 by FDA under an Emergency Use Authorization (EUA). This EUA will remain  in effect (meaning this test can be used) for the duration of the COVID-19 declaration under Section 564(b)(1) of the Act, 21 U.S.C.section 360bbb-3(b)(1), unless the authorization is terminated  or revoked sooner.       Influenza A by PCR NEGATIVE NEGATIVE Final   Influenza B by PCR NEGATIVE NEGATIVE Final    Comment: (NOTE) The Xpert Xpress SARS-CoV-2/FLU/RSV plus assay is intended as an aid in the diagnosis of influenza from Nasopharyngeal swab specimens and should not be used as a sole basis for treatment. Nasal washings and aspirates are unacceptable for Xpert Xpress SARS-CoV-2/FLU/RSV testing.  Fact Sheet for Patients: EntrepreneurPulse.com.au  Fact Sheet for Healthcare  Providers: IncredibleEmployment.be  This test is not yet approved or cleared by the [place] FDA  and has been authorized for detection and/or diagnosis of SARS-CoV-2 by FDA under an Emergency Use Authorization (EUA). This EUA will remain in effect (meaning this test can be used) for the duration of the COVID-19 declaration under Section 564(b)(1) of the Act, 21 U.S.C. section 360bbb-3(b)(1), unless the authorization is terminated or revoked.  Performed at [facility], [place] [address] [place], [place] [address]      Labs: Basic Metabolic Panel: Recent Labs  Lab 12/13/20 1329 12/13/20 1338 12/14/20 0331 12/15/20 0641  NA 133*  133* 131* 135 137  K 3.9  3.9 3.8 3.3* 3.1*  CL 100 97* 103 104  CO2  --  _0 GLUCOSE 439* 439* 235* 99  BUN _1 CREATININE 0.80 0.97 0.75 0.68  CALCIUM  --  9.2 8.6* 8.9  MG  --  2.1 1.8  --    Liver Function Tests: Recent Labs  Lab 12/13/20 1338 12/14/20 0331  AST 15 14*  ALT 22 19  ALKPHOS 76 68  BILITOT 0.8 0.8  PROT 6.5 5.4*  ALBUMIN 3.3* 2.7*   No results for input(s): LIPASE, AMYLASE in the last 168 hours. No results for input(s): AMMONIA in the last 168 hours. CBC: Recent Labs  Lab 12/13/20 1329 12/13/20 1338 12/14/20 0331 12/15/20 0641  WBC  --  6.0 5.8 6.1  NEUTROABS  --  3.6 3.3  --   HGB 12.9  12.2 12.5 11.9* 12.4  HCT 38.0  36.0 38.4 35.2* 36.9  MCV  --  91.2 89.8 89.1  PLT  --  308 238 276   Cardiac Enzymes: No results for input(s): CKTOTAL, CKMB, CKMBINDEX, TROPONINI in the last 168 hours. BNP: BNP (last 3 results) No results for input(s): BNP in the last 8760 hours.  ProBNP (last 3 results) No results for input(s): PROBNP in the last 8760 hours.  CBG: Recent Labs  Lab 12/15/20 0635 12/15/20 1226 12/15/20 1651 12/15/20 2316 12/16/20 0625  GLUCAP 122* 234* 220* 137* 79    Signed:  Domenic Polite MD.  [facility] 12/16/2020, 2:14 PM

## 2020-12-16 NOTE — Care Management Important Message (Signed)
Important Message  Patient Details  Name: Jenna Long MRN: [id number] Date of Birth: 07-10-1934   Medicare Important Message Given:  Yes  Patient left prior to IM delivery IM will be mailed to the patient home address.   Trevia Nop 12/16/2020, 3:07 PM

## 2020-12-19 ENCOUNTER — Encounter: Payer: Self-pay | Admitting: *Deleted

## 2020-12-19 LAB — URINE CULTURE: Culture: 10000 — AB

## 2020-12-19 NOTE — Progress Notes (Signed)
Patient ID: Jenna Long, female   DOB: 07/22/1934, 86 y.o.   MRN: [id number] Patient enrolled for [employer] to ship a 30 day cardiac event monitor to her home. Letter with instructions mailed to patient.

## 2020-12-20 DIAGNOSIS — Z961 Presence of intraocular lens: Secondary | ICD-10-CM | POA: Diagnosis not present

## 2020-12-20 DIAGNOSIS — H353132 Nonexudative age-related macular degeneration, bilateral, intermediate dry stage: Secondary | ICD-10-CM | POA: Diagnosis not present

## 2020-12-20 DIAGNOSIS — M316 Other giant cell arteritis: Secondary | ICD-10-CM | POA: Diagnosis not present

## 2020-12-20 LAB — HM DIABETES EYE EXAM

## 2020-12-21 DIAGNOSIS — Z7982 Long term (current) use of aspirin: Secondary | ICD-10-CM | POA: Diagnosis not present

## 2020-12-21 DIAGNOSIS — E782 Mixed hyperlipidemia: Secondary | ICD-10-CM | POA: Diagnosis not present

## 2020-12-21 DIAGNOSIS — Z9181 History of falling: Secondary | ICD-10-CM | POA: Diagnosis not present

## 2020-12-21 DIAGNOSIS — I110 Hypertensive heart disease with heart failure: Secondary | ICD-10-CM | POA: Diagnosis not present

## 2020-12-21 DIAGNOSIS — I5032 Chronic diastolic (congestive) heart failure: Secondary | ICD-10-CM | POA: Diagnosis not present

## 2020-12-21 DIAGNOSIS — E119 Type 2 diabetes mellitus without complications: Secondary | ICD-10-CM | POA: Diagnosis not present

## 2020-12-21 DIAGNOSIS — I69998 Other sequelae following unspecified cerebrovascular disease: Secondary | ICD-10-CM | POA: Diagnosis not present

## 2020-12-21 DIAGNOSIS — Z794 Long term (current) use of insulin: Secondary | ICD-10-CM | POA: Diagnosis not present

## 2020-12-21 DIAGNOSIS — M353 Polymyalgia rheumatica: Secondary | ICD-10-CM | POA: Diagnosis not present

## 2020-12-21 DIAGNOSIS — Z7902 Long term (current) use of antithrombotics/antiplatelets: Secondary | ICD-10-CM | POA: Diagnosis not present

## 2020-12-21 DIAGNOSIS — Z7952 Long term (current) use of systemic steroids: Secondary | ICD-10-CM | POA: Diagnosis not present

## 2020-12-28 NOTE — Progress Notes (Signed)
Office Visit Note  Patient: Jenna Long             Date of Birth: 11-16-1934           MRN: [id number]             PCP: Sharilyn Sites, MD Referring: Cathlyn Parsons, PA-C Visit Date: 12/29/2020  Subjective:  New Patient (Initial Visit) (Temporal arteritis)   History of Present Illness: Jenna Long is a 86 y.o. female here for temporal arteritis. She was recently diagnosed after hospitalization originally on 9/19 for new onset of right sided vision loss evaluation was found to have multiple problems including small vessel ischemic infarct in cerebellum, right vertebral artery stenosis, and temporal artery biopsy confirming GCA. She was started on a slow prednisone taper from 60 mg daily dose down stepwise and on ASA and plavix antiplatelet therapy. She was subsequently readmitted on 10/25 with development of multiple new ischemic infarcts suspected as secondary to orthostatic hypotension with existing small vessel and large vessel disease. She was discharged again on antiplatelet therapy and continuing prednisone taper starting from 40 mg daily dose down at each 2 week intervals. Unfortunately she now has bilateral vision loss additionally deconditioning and loss of balance. She is currently taking prednisone 20 mg PO daily just decreased from 30 mg daily dose. She has increased bruising on her forearms also uncontrolled hyperglycemia new start on insulin related to the prednisone treatment. Otherwise no complaints taking the medication. She is experiencing large variation in blood pressures at home some hypertensive to 267T systolic and some orthostatic hypotension down to 24P systolic. Her daughter is concerned due to the recent readmission with strokes after hypotension as preceded with similar symptoms and indigestion.   Labs reviewed 10/2020 ESR 111 CRP 8.6 Right temporal artery biopsy - Active arteritis with features consistent with temporal arteritis.    12/13/20 MRI Brain W and  W/O contrast IMPRESSION: Few small acute to subacute infarcts in the left frontal and parietal lobes. Stable chronic microvascular ischemic changes.  11/10/20 Right temporal artery [place] Right superficial temporal artery exhibits hyperechoic halo sign with intimal thickness measuring 0.1cm. The right superficial temporal artery is noncompressible.   11/08/20 MRI Brain W and W/O Contrast IMPRESSION: MRI brain: 1. Mildly motion degraded exam. 2. Two subcentimeter acute infarcts within the inferior right cerebellar hemisphere. 3. Signal abnormality within the right vertebral artery at the level of the foramen magnum, suggesting high-grade stenosis or vessel occlusion at this site. 4. Chronic lacunar infarcts within the left cerebral hemispheric white matter, bilateral basal ganglia, and bilateral thalami. Background moderate chronic small vessel ischemic changes within the cerebral white matter, and within the pons. 5. Mild generalized parenchymal atrophy. MRI orbits: 1. Mildly motion degraded exam. 2. Abnormal enhancement along the optic nerve sheaths bilaterally, compatible with bilateral optic nerve perineuritis. 3. Minimal mucosal thickening within the bilateral ethmoid air cells.  Activities of Daily Living:  Patient reports morning stiffness for 0  none .   Patient Denies nocturnal pain.  Difficulty dressing/grooming: Reports Difficulty climbing stairs: Reports Difficulty getting out of chair: Reports Difficulty using hands for taps, buttons, cutlery, and/or writing: Reports  Review of Systems  Constitutional:  Positive for fatigue.  HENT:  Negative for mouth dryness.   Eyes:  Positive for visual disturbance.  Respiratory:  Negative for shortness of breath.   Cardiovascular:  Positive for swelling in legs/feet.  Gastrointestinal:  Negative for constipation.  Endocrine: Negative for excessive thirst.  Genitourinary:  Negative for difficulty urinating.  Musculoskeletal:  Positive  for gait problem.  Skin:  Negative for rash.  Allergic/Immunologic: Negative for susceptible to infections.  Neurological:  Positive for dizziness and headaches.  Hematological:  Negative for bruising/bleeding tendency.  Psychiatric/Behavioral:  Positive for confusion.    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 (HCC) 12/13/2020   Mixed diabetic hyperlipidemia associated with type 2 diabetes mellitus (HCC) 12/13/2020   Acute cystitis without hematuria 12/13/2020   Chronic diastolic CHF (congestive heart failure) (HCC) 12/13/2020   Vision loss of right eye    Dyslipidemia    Type 2 diabetes mellitus with hyperglycemia, with long-term current use of insulin (HCC)    Cerebral thrombosis with cerebral infarction 11/09/2020   Temporal arteritis (HCC) 11/08/2020   Diabetes mellitus type 2 in nonobese (HCC) 11/08/2020   DNR (do not resuscitate) 11/08/2020   Left displaced femoral neck fracture (HCC) 02/18/2020   Essential hypertension 02/18/2020   Mixed hyperlipidemia 02/18/2020   Fall     Past Medical History:  Diagnosis Date   Congestive heart failure (CHF) (HCC)    Diabetes mellitus type II    DNR (do not resuscitate) 11/08/2020   Hypercholesteremia    Hypertension    Temporal arteritis (HCC)     Family History  Problem Relation Age of Onset   Stroke Mother    Heart disease Father    Cancer Sister    Anesthesia problems Neg Hx    Past Surgical History:  Procedure Laterality Date   ABDOMINAL HYSTERECTOMY  1991   [facility], [place]   ANKLE SURGERY  1989   left, [facility]   ARTERY BIOPSY Right 11/09/2020   Procedure: BIOPSY TEMPORAL ARTERY;  Surgeon: Leonie Douglas, MD;  Location: [facility] OR;  Service: Vascular;  Laterality: Right;   CATARACT EXTRACTION W/ INTRAOCULAR LENS IMPLANT  06/10/06    Right, [facility], [place]   CATARACT EXTRACTION W/PHACO  09/18/2010   Procedure: CATARACT EXTRACTION PHACO AND  INTRAOCULAR LENS PLACEMENT (IOC);  Surgeon: Susa Simmonds;  Location: [facility] ORS;  Service: Ophthalmology;  Laterality: Left;   COLONOSCOPY W/ POLYPECTOMY  06/20/10   [facility], [place]   HIP ARTHROPLASTY Left 02/18/2020   Procedure: ARTHROPLASTY BIPOLAR HIP (HEMIARTHROPLASTY);  Surgeon: Oliver Barre, MD;  Location: [facility] ORS;  Service: Orthopedics;  Laterality: Left;   Social History   Social History Narrative   Not on file   Immunization History  Administered Date(s) Administered   Fluad Quad(high Dose 65+) 11/19/2020   Moderna Sars-Covid-2 Vaccination 02/21/2020     Objective: Vital Signs: BP 132/64 (BP Location: Left Arm, Patient Position: Sitting, Cuff Size: Normal)   Pulse 68   Resp 16   Ht 5\' 7"  (1.702 m)   Wt 130 lb (59 kg)   BMI 20.36 kg/m    Physical Exam Constitutional:      Appearance: She is ill-appearing.     Comments: In wheelchair  HENT:     Head:     Comments: No tenderness to palpation over scalp, temples, or jaw    Right Ear: External ear normal.     Left Ear: External ear normal.     Mouth/Throat:     Mouth: Mucous membranes are moist.     Pharynx: Oropharynx is clear.  Eyes:     Comments: Nonreactive  Cardiovascular:     Rate and Rhythm: Normal rate and regular rhythm.  Pulmonary:  Effort: Pulmonary effort is normal.     Breath sounds: Normal breath sounds.  Musculoskeletal:     Comments: 1+ pedal edema b/l  Skin:    General: Skin is warm and dry.     Comments: Few bruises on forearm extensor surfaces  Neurological:     Mental Status: She is alert.  Psychiatric:     Comments: Somewhat depressed expresses ideation including being "put to sleep"     Musculoskeletal Exam:  Shoulders full ROM no tenderness or swelling Elbows full ROM no tenderness or swelling Wrists full ROM no tenderness or swelling Fingers full ROM no tenderness or swelling Knees full ROM no tenderness or swelling Ankles full ROM no tenderness or  swelling   Investigation: No additional findings.  Imaging: DG Chest 2 View  Result Date: 12/13/2020 CLINICAL DATA:  Weakness EXAM: CHEST - 2 VIEW COMPARISON:  Chest x-ray 06/27/2020 FINDINGS: Heart size and mediastinum appear stable and within normal limits. No focal consolidation identified. No pleural effusion or pneumothorax. Bones are osteopenic. IMPRESSION: No acute intrathoracic process identified. Electronically Signed   By: Ofilia Neas   On: 12/13/2020 14:17   MR Brain W and Wo Contrast  Result Date: 12/13/2020 CLINICAL DATA:  Neuro deficit, acute, stroke suspected vision changes and confusion, hx of recent stroke and temporal arteritis EXAM: MRI HEAD WITHOUT AND WITH CONTRAST TECHNIQUE: Multiplanar, multiecho pulse sequences of the brain and surrounding structures were obtained without and with intravenous contrast. CONTRAST:  79mL GADAVIST GADOBUTROL 1 MMOL/ML IV SOLN COMPARISON:  11/08/2020 FINDINGS: Brain: Small focus of mildly reduced diffusion in the posterior left frontal lobe. Probable small foci of mild cortical diffusion hyperintensity in the left parietal lobe. There is no evidence of intracranial hemorrhage. There is no intracranial mass or mass effect. There is no hydrocephalus or extra-axial fluid collection. Prominence of the ventricles and sulci reflects stable parenchymal volume loss. Patchy and confluent areas of T2 hyperintensity in the supratentorial and pontine white matter are nonspecific but probably reflect stable chronic microvascular ischemic changes. A few superimposed chronic small vessel infarcts again noted. No abnormal enhancement. Vascular: Major vessel flow voids at the skull base are stable in appearance with diminished right vertebral flow void. Skull and upper cervical spine: Normal marrow signal is preserved. Sinuses/Orbits: Paranasal sinuses are aerated. Orbits are unremarkable. Other: Sella is partially empty.  Mastoid air cells are clear.  IMPRESSION: Few small acute to subacute infarcts in the left frontal and parietal lobes. Stable chronic microvascular ischemic changes. Electronically Signed   By: Macy Mis M.D.   On: 12/13/2020 18:59   ECHOCARDIOGRAM COMPLETE BUBBLE STUDY  Result Date: 12/14/2020    ECHOCARDIOGRAM REPORT   Patient Name:   DOMINQUE LEVANDOWSKI Date of Exam: 12/14/2020 Medical Rec #:  [id number]      Height:       67.0 in Accession #:    [id number]     Weight:       129.6 lb Date of Birth:  [birth year]       BSA:          1.682 m Patient Age:    55 years       BP:           142/58 mmHg Patient Gender: F              HR:           87 bpm. Exam Location:  Inpatient Procedure: 2D Echo, Cardiac Doppler and Color Doppler Indications:  I50.33 Acute on chronic diastolic (congestive) heart failure;                 I10 Hypertension  History:        Patient has prior history of Echocardiogram examinations, most                 recent 11/10/2020. Stroke.  Sonographer:    Glo Herring Referring Phys: [id number] [place]  1. Left ventricular ejection fraction, by estimation, is 65 to 70%. The left ventricle has normal function. The left ventricle has no regional wall motion abnormalities. There is mild concentric left ventricular hypertrophy. Left ventricular diastolic parameters are consistent with Grade I diastolic dysfunction (impaired relaxation).  2. Right ventricular systolic function is normal. The right ventricular size is normal.  3. The mitral valve is normal in structure. No evidence of mitral valve regurgitation. No evidence of mitral stenosis.  4. The aortic valve is tricuspid. There is moderate calcification of the aortic valve. Aortic valve regurgitation is not visualized. No aortic stenosis is present.  5. The inferior vena cava is normal in size with greater than 50% respiratory variability, suggesting right atrial pressure of 3 mmHg.  6. Agitated saline contrast bubble study was negative, with no evidence  of any interatrial shunt. FINDINGS  Left Ventricle: Left ventricular ejection fraction, by estimation, is 65 to 70%. The left ventricle has normal function. The left ventricle has no regional wall motion abnormalities. The left ventricular internal cavity size was normal in size. There is  mild concentric left ventricular hypertrophy. Left ventricular diastolic parameters are consistent with Grade I diastolic dysfunction (impaired relaxation). Right Ventricle: The right ventricular size is normal. No increase in right ventricular wall thickness. Right ventricular systolic function is normal. Left Atrium: Left atrial size was not well visualized. Right Atrium: Right atrial size was not well visualized. Pericardium: There is no evidence of pericardial effusion. Mitral Valve: The mitral valve is normal in structure. There is mild calcification of the mitral valve leaflet(s). Mild mitral annular calcification. No evidence of mitral valve regurgitation. No evidence of mitral valve stenosis. Tricuspid Valve: The tricuspid valve is normal in structure. Tricuspid valve regurgitation is trivial. No evidence of tricuspid stenosis. Aortic Valve: The aortic valve is tricuspid. There is moderate calcification of the aortic valve. Aortic valve regurgitation is not visualized. No aortic stenosis is present. Pulmonic Valve: The pulmonic valve was normal in structure. Pulmonic valve regurgitation is not visualized. No evidence of pulmonic stenosis. Aorta: The aortic root is normal in size and structure. Venous: The inferior vena cava is normal in size with greater than 50% respiratory variability, suggesting right atrial pressure of 3 mmHg. IAS/Shunts: No atrial level shunt detected by color flow Doppler. Agitated saline contrast was given intravenously to evaluate for intracardiac shunting. Agitated saline contrast bubble study was negative, with no evidence of any interatrial shunt.  LEFT VENTRICLE PLAX 2D LVIDd:         4.20 cm  Diastology LVIDs:         2.50 cm LV e' medial:    4.13 cm/s LV PW:         1.20 cm LV E/e' medial:  17.3 LV IVS:        1.30 cm LV e' lateral:   6.53 cm/s                        LV E/e' lateral: 11.0  IVC IVC diam: 1.40  cm LEFT ATRIUM         Index LA diam:    2.70 cm 1.61 cm/m   AORTA Ao Root diam: 3.30 cm MITRAL VALVE MV Area (PHT): 3.03 cm MV Decel Time: 250 msec MV E velocity: 71.55 cm/s MV A velocity: 92.10 cm/s MV E/A ratio:  0.78 Glori Bickers MD Electronically signed by Glori Bickers MD Signature Date/Time: 12/14/2020/3:23:35 PM    Final    VAS [place] LOWER EXTREMITY VENOUS (DVT)  Result Date: 12/15/2020  Lower Venous DVT Study Patient Name:  KALEENA CORROW  Date of Exam:   12/15/2020 Medical Rec #: [id number]       Accession #:    [id number] Date of Birth: August 04, 1934        Patient Gender: F Patient Age:   20 years Exam Location:  [facility] Procedure:      VAS [place] LOWER EXTREMITY VENOUS (DVT) Referring Phys: Cornelius Moras XU --------------------------------------------------------------------------------  Indications: Stroke.  Comparison Study: no prior Performing Technologist: Archie Patten RVS  Examination Guidelines: A complete evaluation includes B-mode imaging, spectral Doppler, color Doppler, and power Doppler as needed of all accessible portions of each vessel. Bilateral testing is considered an integral part of a complete examination. Limited examinations for reoccurring indications may be performed as noted. The reflux portion of the exam is performed with the patient in reverse Trendelenburg.  +---------+---------------+---------+-----------+----------+--------------+ RIGHT    CompressibilityPhasicitySpontaneityPropertiesThrombus Aging +---------+---------------+---------+-----------+----------+--------------+ CFV      Full           Yes      Yes                                 +---------+---------------+---------+-----------+----------+--------------+ SFJ      Full                                                         +---------+---------------+---------+-----------+----------+--------------+ FV Prox  Full                                                        +---------+---------------+---------+-----------+----------+--------------+ FV Mid   Full                                                        +---------+---------------+---------+-----------+----------+--------------+ FV DistalFull                                                        +---------+---------------+---------+-----------+----------+--------------+ PFV      Full                                                        +---------+---------------+---------+-----------+----------+--------------+  POP      Full           Yes      Yes                                 +---------+---------------+---------+-----------+----------+--------------+ PTV      Full                                                        +---------+---------------+---------+-----------+----------+--------------+ PERO     Full                                                        +---------+---------------+---------+-----------+----------+--------------+   +---------+---------------+---------+-----------+----------+--------------+ LEFT     CompressibilityPhasicitySpontaneityPropertiesThrombus Aging +---------+---------------+---------+-----------+----------+--------------+ CFV      Full           Yes      Yes                                 +---------+---------------+---------+-----------+----------+--------------+ SFJ      Full                                                        +---------+---------------+---------+-----------+----------+--------------+ FV Prox  Full                                                        +---------+---------------+---------+-----------+----------+--------------+ FV Mid   Full                                                         +---------+---------------+---------+-----------+----------+--------------+ FV DistalFull                                                        +---------+---------------+---------+-----------+----------+--------------+ PFV      Full                                                        +---------+---------------+---------+-----------+----------+--------------+ POP      Full           Yes      Yes                                 +---------+---------------+---------+-----------+----------+--------------+  PTV      Full                                                        +---------+---------------+---------+-----------+----------+--------------+ PERO     Full                                                        +---------+---------------+---------+-----------+----------+--------------+     Summary: BILATERAL: - No evidence of deep vein thrombosis seen in the lower extremities, bilaterally. -No evidence of popliteal cyst, bilaterally.   *See table(s) above for measurements and observations. Electronically signed by Orlie Pollen on 12/15/2020 at 4:22:47 PM.    Final     Recent Labs: Lab Results  Component Value Date   WBC 6.1 12/15/2020   HGB 12.4 12/15/2020   PLT 276 12/15/2020   NA 137 12/15/2020   K 3.1 (L) 12/15/2020   CL 104 12/15/2020   CO2 23 12/15/2020   GLUCOSE 99 12/15/2020   BUN 12 12/15/2020   CREATININE 0.68 12/15/2020   BILITOT 0.8 12/14/2020   ALKPHOS 68 12/14/2020   AST 14 (L) 12/14/2020   ALT 19 12/14/2020   PROT 5.4 (L) 12/14/2020   ALBUMIN 2.7 (L) 12/14/2020   CALCIUM 8.9 12/15/2020   GFRAA >60 09/12/2010    Speciality Comments: No specialty comments available.  Procedures:  No procedures performed Allergies: Patient has no known allergies.   Assessment / Plan:     Visit Diagnoses: Temporal arteritis ([place]) - Plan: Sedimentation rate, C-reactive protein, CBC with Differential/Platelet, predniSONE (DELTASONE) 5 MG  tablet  Unfortunately already extensive damage including vision loss and strokes. Will recheck ESR and CRP today for disease monitoring. Recommended prednisone tapering regimen based on at least 52 week duration. Recent CTA head and neck and TTE showed changes in multiple large vessels nonspecific but could be related to extracranial GCA activity, but no evidence of aortic root dilatation. Do not recommend starting actemra at this time with labile BPs and multiple comorbidity unless failure to tolerate prednisone taper successfully. We will follow up in 8 weeks to reassess.  Acute cystitis without hematuria  Not clear whether previous admission was related to an infection currently has no symptoms. Discussed increased risk for infections on long term prednisone use but prophylaxis not routinely recommended at dose of 20 mg and less.  Orders: Orders Placed This Encounter  Procedures   Sedimentation rate   C-reactive protein   CBC with Differential/Platelet    Meds ordered this encounter  Medications   predniSONE (DELTASONE) 5 MG tablet    Sig: Take as directed in combination with 10 mg tablets    Dispense:  30 tablet    Refill:  0    Follow-Up Instructions: Return in about 2 months (around 02/28/2021) for New pt GCA f/u 52mos.   Collier Salina, MD  Note - This record has been created using [employer].  Chart creation errors have been sought, but may not always  have been located. Such creation errors do not reflect on  the standard of medical care.

## 2020-12-29 ENCOUNTER — Other Ambulatory Visit: Payer: Self-pay

## 2020-12-29 ENCOUNTER — Encounter (INDEPENDENT_AMBULATORY_CARE_PROVIDER_SITE_OTHER): Payer: Self-pay

## 2020-12-29 ENCOUNTER — Encounter: Payer: Self-pay | Admitting: Internal Medicine

## 2020-12-29 ENCOUNTER — Ambulatory Visit (INDEPENDENT_AMBULATORY_CARE_PROVIDER_SITE_OTHER): Payer: PPO | Admitting: Internal Medicine

## 2020-12-29 VITALS — BP 132/64 | HR 68 | Resp 16 | Ht 67.0 in | Wt 130.0 lb

## 2020-12-29 DIAGNOSIS — M316 Other giant cell arteritis: Secondary | ICD-10-CM

## 2020-12-29 DIAGNOSIS — N3000 Acute cystitis without hematuria: Secondary | ICD-10-CM

## 2020-12-29 MED ORDER — PREDNISONE 5 MG PO TABS: ORAL_TABLET | ORAL | 0 refills | Status: DC

## 2020-12-29 NOTE — Patient Instructions (Addendum)
I recommend continuing the daily prednisone for controlling inflammation from the temporal arteritis. We will need to follow up periodically to recheck for symptoms and blood tests for monitoring the disease activity. Preventative antibiotics should not be needed at these steroid doses but there is still some increased risk for infections with long term steroids.  After several months, repeat imaging would be recommended for monitoring the inflammation of the large blood vessels.  I recommend reducing the prednisone dose slowly as follows (we will be following up before completing these all): 25 mg by mouth daily for 1 week 20 mg by mouth daily for 2 weeks 17.5 mg by mouth daily for 2 weeks 15 mg by mouth daily for 2 weeks 12.5 mg by mouth daily for 2 weeks 10 mg by mouth daily for 4 weeks

## 2020-12-30 LAB — CBC WITH DIFFERENTIAL/PLATELET
Absolute Monocytes: 437 cells/uL (ref 200–950)
Basophils Absolute: 10 cells/uL (ref 0–200)
Basophils Relative: 0.1 %
Eosinophils Absolute: 10 cells/uL — ABNORMAL LOW (ref 15–500)
Eosinophils Relative: 0.1 %
HCT: 40.3 % (ref 35.0–45.0)
Hemoglobin: 13.3 g/dL (ref 11.7–15.5)
Lymphs Abs: 703 cells/uL — ABNORMAL LOW (ref 850–3900)
MCH: 30.8 pg (ref 27.0–33.0)
MCHC: 33 g/dL (ref 32.0–36.0)
MCV: 93.3 fL (ref 80.0–100.0)
MPV: 9.5 fL (ref 7.5–12.5)
Monocytes Relative: 4.6 %
Neutro Abs: 8341 cells/uL — ABNORMAL HIGH (ref 1500–7800)
Neutrophils Relative %: 87.8 %
Platelets: 305 10*3/uL (ref 140–400)
RBC: 4.32 10*6/uL (ref 3.80–5.10)
RDW: 18.8 % — ABNORMAL HIGH (ref 11.0–15.0)
Total Lymphocyte: 7.4 %
WBC: 9.5 10*3/uL (ref 3.8–10.8)

## 2020-12-30 LAB — SEDIMENTATION RATE: Sed Rate: 17 mm/h (ref 0–30)

## 2020-12-30 LAB — C-REACTIVE PROTEIN: CRP: 1.8 mg/L (ref <8.0)

## 2020-12-30 NOTE — Progress Notes (Signed)
Blood tests show normal inflammatory markers the ESR is 17 compared to 111 before starting the prednisone. So she should be okay to proceed on the planned steroid tapering.

## 2021-01-02 DIAGNOSIS — I5032 Chronic diastolic (congestive) heart failure: Secondary | ICD-10-CM | POA: Diagnosis not present

## 2021-01-02 DIAGNOSIS — E119 Type 2 diabetes mellitus without complications: Secondary | ICD-10-CM | POA: Diagnosis not present

## 2021-01-02 DIAGNOSIS — Z7982 Long term (current) use of aspirin: Secondary | ICD-10-CM | POA: Diagnosis not present

## 2021-01-02 DIAGNOSIS — E782 Mixed hyperlipidemia: Secondary | ICD-10-CM | POA: Diagnosis not present

## 2021-01-02 DIAGNOSIS — Z7902 Long term (current) use of antithrombotics/antiplatelets: Secondary | ICD-10-CM | POA: Diagnosis not present

## 2021-01-02 DIAGNOSIS — M353 Polymyalgia rheumatica: Secondary | ICD-10-CM | POA: Diagnosis not present

## 2021-01-02 DIAGNOSIS — Z9181 History of falling: Secondary | ICD-10-CM | POA: Diagnosis not present

## 2021-01-02 DIAGNOSIS — Z794 Long term (current) use of insulin: Secondary | ICD-10-CM | POA: Diagnosis not present

## 2021-01-02 DIAGNOSIS — Z7952 Long term (current) use of systemic steroids: Secondary | ICD-10-CM | POA: Diagnosis not present

## 2021-01-02 DIAGNOSIS — I69998 Other sequelae following unspecified cerebrovascular disease: Secondary | ICD-10-CM | POA: Diagnosis not present

## 2021-01-02 DIAGNOSIS — I110 Hypertensive heart disease with heart failure: Secondary | ICD-10-CM | POA: Diagnosis not present

## 2021-01-03 DIAGNOSIS — I110 Hypertensive heart disease with heart failure: Secondary | ICD-10-CM | POA: Diagnosis not present

## 2021-01-03 DIAGNOSIS — I69998 Other sequelae following unspecified cerebrovascular disease: Secondary | ICD-10-CM | POA: Diagnosis not present

## 2021-01-03 DIAGNOSIS — M353 Polymyalgia rheumatica: Secondary | ICD-10-CM | POA: Diagnosis not present

## 2021-01-03 DIAGNOSIS — I5032 Chronic diastolic (congestive) heart failure: Secondary | ICD-10-CM | POA: Diagnosis not present

## 2021-01-19 DIAGNOSIS — Z9181 History of falling: Secondary | ICD-10-CM | POA: Diagnosis not present

## 2021-01-19 DIAGNOSIS — Z794 Long term (current) use of insulin: Secondary | ICD-10-CM | POA: Diagnosis not present

## 2021-01-19 DIAGNOSIS — I110 Hypertensive heart disease with heart failure: Secondary | ICD-10-CM | POA: Diagnosis not present

## 2021-01-19 DIAGNOSIS — Z7902 Long term (current) use of antithrombotics/antiplatelets: Secondary | ICD-10-CM | POA: Diagnosis not present

## 2021-01-19 DIAGNOSIS — M353 Polymyalgia rheumatica: Secondary | ICD-10-CM | POA: Diagnosis not present

## 2021-01-19 DIAGNOSIS — Z7952 Long term (current) use of systemic steroids: Secondary | ICD-10-CM | POA: Diagnosis not present

## 2021-01-19 DIAGNOSIS — E119 Type 2 diabetes mellitus without complications: Secondary | ICD-10-CM | POA: Diagnosis not present

## 2021-01-19 DIAGNOSIS — Z7982 Long term (current) use of aspirin: Secondary | ICD-10-CM | POA: Diagnosis not present

## 2021-01-19 DIAGNOSIS — I5032 Chronic diastolic (congestive) heart failure: Secondary | ICD-10-CM | POA: Diagnosis not present

## 2021-01-19 DIAGNOSIS — I69998 Other sequelae following unspecified cerebrovascular disease: Secondary | ICD-10-CM | POA: Diagnosis not present

## 2021-01-19 DIAGNOSIS — E782 Mixed hyperlipidemia: Secondary | ICD-10-CM | POA: Diagnosis not present

## 2021-01-24 DIAGNOSIS — M353 Polymyalgia rheumatica: Secondary | ICD-10-CM | POA: Diagnosis not present

## 2021-01-24 DIAGNOSIS — I69998 Other sequelae following unspecified cerebrovascular disease: Secondary | ICD-10-CM | POA: Diagnosis not present

## 2021-01-24 DIAGNOSIS — Z7952 Long term (current) use of systemic steroids: Secondary | ICD-10-CM | POA: Diagnosis not present

## 2021-01-24 DIAGNOSIS — I110 Hypertensive heart disease with heart failure: Secondary | ICD-10-CM | POA: Diagnosis not present

## 2021-01-24 DIAGNOSIS — E119 Type 2 diabetes mellitus without complications: Secondary | ICD-10-CM | POA: Diagnosis not present

## 2021-01-24 DIAGNOSIS — Z7982 Long term (current) use of aspirin: Secondary | ICD-10-CM | POA: Diagnosis not present

## 2021-01-24 DIAGNOSIS — E782 Mixed hyperlipidemia: Secondary | ICD-10-CM | POA: Diagnosis not present

## 2021-01-24 DIAGNOSIS — Z794 Long term (current) use of insulin: Secondary | ICD-10-CM | POA: Diagnosis not present

## 2021-01-24 DIAGNOSIS — Z7902 Long term (current) use of antithrombotics/antiplatelets: Secondary | ICD-10-CM | POA: Diagnosis not present

## 2021-01-24 DIAGNOSIS — I5032 Chronic diastolic (congestive) heart failure: Secondary | ICD-10-CM | POA: Diagnosis not present

## 2021-01-24 DIAGNOSIS — Z9181 History of falling: Secondary | ICD-10-CM | POA: Diagnosis not present

## 2021-01-27 ENCOUNTER — Telehealth: Payer: Self-pay

## 2021-01-27 NOTE — Telephone Encounter (Signed)
Office note has been faxed.

## 2021-01-27 NOTE — Telephone Encounter (Signed)
Jenna Long, referral coordinator at [facility] requested office notes from patient's appointment on 12/29/20 with Dr. Vernelle Emerald.  Please fax to [phone]

## 2021-01-30 DIAGNOSIS — E782 Mixed hyperlipidemia: Secondary | ICD-10-CM | POA: Diagnosis not present

## 2021-01-30 DIAGNOSIS — I6930 Unspecified sequelae of cerebral infarction: Secondary | ICD-10-CM | POA: Diagnosis not present

## 2021-01-30 DIAGNOSIS — M316 Other giant cell arteritis: Secondary | ICD-10-CM | POA: Diagnosis not present

## 2021-01-30 DIAGNOSIS — I110 Hypertensive heart disease with heart failure: Secondary | ICD-10-CM | POA: Diagnosis not present

## 2021-01-30 DIAGNOSIS — E1165 Type 2 diabetes mellitus with hyperglycemia: Secondary | ICD-10-CM | POA: Diagnosis not present

## 2021-01-30 DIAGNOSIS — E663 Overweight: Secondary | ICD-10-CM | POA: Diagnosis not present

## 2021-01-30 DIAGNOSIS — I5032 Chronic diastolic (congestive) heart failure: Secondary | ICD-10-CM | POA: Diagnosis not present

## 2021-01-30 DIAGNOSIS — I69998 Other sequelae following unspecified cerebrovascular disease: Secondary | ICD-10-CM | POA: Diagnosis not present

## 2021-01-30 DIAGNOSIS — M353 Polymyalgia rheumatica: Secondary | ICD-10-CM | POA: Diagnosis not present

## 2021-01-30 DIAGNOSIS — Z6821 Body mass index (BMI) 21.0-21.9, adult: Secondary | ICD-10-CM | POA: Diagnosis not present

## 2021-01-30 NOTE — Progress Notes (Deleted)
Cardiology Office Note:    Date:  01/30/2021   ID:  Jenna Long, DOB Jan 13, 1935, MRN [id number]  PCP:  Jenna Sites, MD   [facility] Providers Cardiologist:  None { Click to update primary MD,subspecialty MD or APP then REFRESH:1}    Referring MD: Jenna Sites, MD   CC: *** Consulted for the evaluation of stroke eval at the behest of Jenna Sites, MD  History of Present Illness:    Jenna Long is a 86 y.o. female with a hx of Stroke, history of HTN with DM, HLD with DM, Temporary arteritis and inflammatory vascular disease,HF NOS, who presents for evaluation 01/31/21.  Patient notes that she is feeling ***.  Has had no chest pain, chest pressure, chest tightness, chest stinging ***.  Discomfort occurs with ***, worsens with ***, and improves with ***.  Patient exertion notable for *** with *** and feels no symptoms.  No shortness of breath, DOE ***.  No PND or orthopnea***.  No weight gain***, leg swelling ***, or abdominal swelling***.  No syncope or near syncope ***. Notes *** no palpitations or funny heart beats.     Patient reports prior cardiac testing including ***  No history of ***pre-eclampsia, gestation HTN or gestational DM.  No Fen-Phen or drug use***.  Ambulatory BP ***.     Past Medical History:  Diagnosis Date   Congestive heart failure (CHF) ([place])    Diabetes mellitus type II    DNR (do not resuscitate) 11/08/2020   Hypercholesteremia    Hypertension    Temporal arteritis ([place])     Past Surgical History:  Procedure Laterality Date   ABDOMINAL HYSTERECTOMY  1991   [facility], [place]   left, [place]   ARTERY BIOPSY Right 11/09/2020   Procedure: BIOPSY TEMPORAL ARTERY;  Surgeon: Cherre Robins, MD;  Location: [facility];  Service: Vascular;  Laterality: Right;   CATARACT EXTRACTION W/ INTRAOCULAR LENS IMPLANT  06/10/06    Right, [facility], [place]   CATARACT EXTRACTION W/PHACO  09/18/2010   Procedure: CATARACT EXTRACTION PHACO AND  INTRAOCULAR LENS PLACEMENT ([place]);  Surgeon: Williams Che;  [facility]: [facility];  Service: Ophthalmology;  Laterality: Left;   COLONOSCOPY W/ POLYPECTOMY  06/20/10   [facility], [place]   HIP ARTHROPLASTY Left 02/18/2020   Procedure: ARTHROPLASTY BIPOLAR HIP (HEMIARTHROPLASTY);  Surgeon: Mordecai Rasmussen, MD;  Location: [facility];  Service: Orthopedics;  Laterality: Left;    Current Medications: No outpatient medications have been marked as taking for the 01/31/21 encounter (Appointment) with Werner Lean, MD.     Allergies:   Patient has no known allergies.   Social History   Socioeconomic History   Marital status: Widowed    Spouse name: Not on file   Number of children: Not on file   Years of education: Not on file   Highest education level: Not on file  Occupational History   Occupation: retired  Tobacco Use   Smoking status: Never   Smokeless tobacco: Never  Vaping Use   Vaping Use: Never used  Substance and Sexual Activity   Alcohol use: No   Drug use: No   Sexual activity: Not on file  Other Topics Concern   Not on file  Social History Narrative   Not on file   Social Determinants of Health   Financial Resource Strain: Not on file  Food Insecurity: Not on file  Transportation Needs: Not on file  Physical Activity: Not on file  Stress: Not on file  Social Connections: Not on file     Family History: The patient's ***family history includes Cancer in her sister; Heart disease in her father; Stroke in her mother. There is no history of Anesthesia problems.  ROS:   Please see the history of present illness.    *** All other systems reviewed and are negative.  EKGs/Labs/Other Studies Reviewed:    The following studies were reviewed today: ***  EKG:  EKG is *** ordered today.  The ekg ordered today demonstrates *** 01/31/21: ***  Cardiac Event Monitoring: Date: Results: No results received ***  Transthoracic Echocardiogram: Date: 11/10/20 Results:   1. Left ventricular ejection fraction, by estimation, is 70 to 75%. The  left ventricle has hyperdynamic function. The left ventricle has no  regional wall motion abnormalities. There is mild left ventricular  hypertrophy. Left ventricular diastolic  parameters are consistent with Grade I diastolic dysfunction (impaired  relaxation).   2. Right ventricular systolic function is normal. The right ventricular  size is normal.   3. The mitral valve is normal in structure. No evidence of mitral valve  regurgitation. No evidence of mitral stenosis.   4. The aortic valve is tricuspid. Aortic valve regurgitation is not  visualized. Mild aortic valve sclerosis is present, with no evidence of  aortic valve stenosis.   5. The inferior vena cava is normal in size with greater than 50%  respiratory variability, suggesting right atrial pressure of 3 mmHg.   Recent Labs: 12/14/2020: ALT 19; Magnesium 1.8 12/15/2020: BUN 12; Creatinine, Ser 0.68; Potassium 3.1; Sodium 137 12/29/2020: Hemoglobin 13.3; Platelets 305  Recent Lipid Panel    Component Value Date/Time   CHOL 185 12/14/2020 0331   TRIG 172 (H) 12/14/2020 0331   HDL 65 12/14/2020 0331   CHOLHDL 2.8 12/14/2020 0331   VLDL 34 12/14/2020 0331   LDLCALC 86 12/14/2020 0331        Physical Exam:    VS:  There were no vitals taken for this visit.    Wt Readings from Last 3 Encounters:  12/29/20 130 lb (59 kg)  12/14/20 129 lb 10.1 oz (58.8 kg)  11/25/20 130 lb 4.7 oz (59.1 kg)    Gen: *** distress, *** obese/well nourished/malnourished   Neck: No JVD, *** carotid bruit Ears: *** Frank Sign Cardiac: No Rubs or Gallops, *** Murmur, ***cardia, *** radial pulses Respiratory: Clear to auscultation bilaterally, *** effort, ***  respiratory rate GI: Soft, nontender, non-distended *** MS: No *** edema; *** moves all extremities Integument: Skin feels *** Neuro:  At time of evaluation, alert and oriented to person/place/time/situation  *** Psych: Normal affect, patient feels ***   ASSESSMENT:    No diagnosis found. PLAN:    Prior Stroke, HTN with DM HLD with DM Temporary arteritis and inflammatory vascular disease HF NOS per chart review only      {Are you ordering a CV Procedure (e.g. stress test, cath, DCCV, TEE, etc)?   Press F2        :[id number]}    Medication Adjustments/Labs and Tests Ordered: Current medicines are reviewed at length with the patient today.  Concerns regarding medicines are outlined above.  No orders of the defined types were placed in this encounter.  No orders of the defined types were placed in this encounter.   There are no Patient Instructions on file for this visit.   Signed, Werner Lean, MD  01/30/2021 10:51 AM    [place]

## 2021-01-31 ENCOUNTER — Ambulatory Visit: Payer: PPO | Admitting: Internal Medicine

## 2021-02-14 ENCOUNTER — Ambulatory Visit: Payer: PPO | Admitting: Orthopedic Surgery

## 2021-02-15 NOTE — Progress Notes (Signed)
Event monitor was never applied. Order will be cancelled

## 2021-03-02 ENCOUNTER — Ambulatory Visit: Payer: PPO | Admitting: Internal Medicine

## 2021-03-02 DIAGNOSIS — Z7984 Long term (current) use of oral hypoglycemic drugs: Secondary | ICD-10-CM | POA: Diagnosis not present

## 2021-03-02 DIAGNOSIS — E1159 Type 2 diabetes mellitus with other circulatory complications: Secondary | ICD-10-CM | POA: Diagnosis not present

## 2021-03-02 DIAGNOSIS — I509 Heart failure, unspecified: Secondary | ICD-10-CM | POA: Diagnosis not present

## 2021-03-02 DIAGNOSIS — M316 Other giant cell arteritis: Secondary | ICD-10-CM | POA: Diagnosis not present

## 2021-03-02 DIAGNOSIS — Z7902 Long term (current) use of antithrombotics/antiplatelets: Secondary | ICD-10-CM | POA: Diagnosis not present

## 2021-03-02 DIAGNOSIS — Z7952 Long term (current) use of systemic steroids: Secondary | ICD-10-CM | POA: Diagnosis not present

## 2021-03-02 DIAGNOSIS — Z6821 Body mass index (BMI) 21.0-21.9, adult: Secondary | ICD-10-CM | POA: Diagnosis not present

## 2021-03-02 DIAGNOSIS — Z794 Long term (current) use of insulin: Secondary | ICD-10-CM | POA: Diagnosis not present

## 2021-03-02 DIAGNOSIS — E785 Hyperlipidemia, unspecified: Secondary | ICD-10-CM | POA: Diagnosis not present

## 2021-03-02 DIAGNOSIS — I110 Hypertensive heart disease with heart failure: Secondary | ICD-10-CM | POA: Diagnosis not present

## 2021-03-02 DIAGNOSIS — E1169 Type 2 diabetes mellitus with other specified complication: Secondary | ICD-10-CM | POA: Diagnosis not present

## 2021-03-02 DIAGNOSIS — D692 Other nonthrombocytopenic purpura: Secondary | ICD-10-CM | POA: Diagnosis not present

## 2021-03-07 NOTE — Progress Notes (Signed)
Office Visit Note  Patient: Jenna Long             Date of Birth: November 25, 1934           MRN: [id number]             PCP: Sharilyn Sites, MD Referring: Sharilyn Sites, MD Visit Date: 03/08/2021   Subjective:   History of Present Illness: Jenna Long is a 87 y.o. female here for follow up for temporal arteritis with bilateral vision loss, ischemic cerebral infarct, and vertebral artery stenosis on prednisone taper down to 10 mg daily dose over past 2 months. She has not noticed any new significant symptoms with reducing her medication. She is having some right sided headache but mostly to the back and neck area. Her blood sugars have been decreasing along with the prednisone so adjusting insulin for this. She has been walking fairly well at home using wheelchair for travel out and about.  Previous HPI 12/29/[name] is a 87 y.o. female here for temporal arteritis. She was recently diagnosed after hospitalization originally on 9/19 for new onset of right sided vision loss evaluation was found to have multiple problems including small vessel ischemic infarct in cerebellum, right vertebral artery stenosis, and temporal artery biopsy confirming GCA. She was started on a slow prednisone taper from 60 mg daily dose down stepwise and on ASA and plavix antiplatelet therapy. She was subsequently readmitted on 10/25 with development of multiple new ischemic infarcts suspected as secondary to orthostatic hypotension with existing small vessel and large vessel disease. She was discharged again on antiplatelet therapy and continuing prednisone taper starting from 40 mg daily dose down at each 2 week intervals. Unfortunately she now has bilateral vision loss additionally deconditioning and loss of balance. She is currently taking prednisone 20 mg PO daily just decreased from 30 mg daily dose. She has increased bruising on her forearms also uncontrolled hyperglycemia new start on insulin related to the  prednisone treatment. Otherwise no complaints taking the medication. She is experiencing large variation in blood pressures at home some hypertensive to 371G systolic and some orthostatic hypotension down to 62I systolic. Her daughter is concerned due to the recent readmission with strokes after hypotension as preceded with similar symptoms and indigestion.    Labs reviewed 10/2020 ESR 111 CRP 8.6 Right temporal artery biopsy - Active arteritis with features consistent with temporal arteritis.    Review of Systems  Eyes:  Negative for pain.  Gastrointestinal:  Negative for blood in stool and diarrhea.  Musculoskeletal:  Positive for joint pain, joint pain and muscle weakness.  Skin:  Negative for rash.  Neurological:  Positive for headaches.  Hematological:  Positive for bruising/bleeding tendency.   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 ([place]) 12/13/2020   Mixed diabetic hyperlipidemia associated with type 2 diabetes mellitus ([place]) 12/13/2020   Acute cystitis without hematuria 12/13/2020   Chronic diastolic CHF (congestive heart failure) ([place]) 12/13/2020   Vision loss of right eye    Dyslipidemia    Type 2 diabetes mellitus with hyperglycemia, with long-term current use of insulin (H[facility])    Cerebral thrombosis with cerebral infarction 11/09/2020   Temporal arteritis ([place]) 11/08/2020   Diabetes mellitus type 2 in nonobese ([place]) 11/08/2020   DNR (do not resuscitate) 11/08/2020   Left displaced femoral neck fracture ([place]) 02/18/2020   Essential hypertension 02/18/2020  Mixed hyperlipidemia 02/18/2020   Fall     Past Medical History:  Diagnosis Date   Body mass index (BMI) 21.0-21.9, adult    Congestive heart failure (CHF) ([place])    Diabetes mellitus type II    DM (diabetes mellitus) ([place])    DNR (do not resuscitate) 11/08/2020   Hypercholesteremia    Hyperlipidemia    Hypertension     Osteoarthritis    Overweight    Polymyalgia rheumatica ([place])    [place] of cerebrovascular accident    Temporal arteritis ([place])     Family History  Problem Relation Age of Onset   Stroke Mother    Heart disease Father    Cancer Sister    Anesthesia problems Neg Hx    Past Surgical History:  Procedure Laterality Date   ABDOMINAL HYSTERECTOMY  1991   [facility], [place]   left, [place]   ARTERY BIOPSY Right 11/09/2020   Procedure: BIOPSY TEMPORAL ARTERY;  Surgeon: Cherre Robins, MD;  Location: [place];  Service: Vascular;  Laterality: Right;   CATARACT EXTRACTION W/ INTRAOCULAR LENS IMPLANT  06/10/06    Right, [facility], [place]   CATARACT EXTRACTION W/PHACO  09/18/2010   Procedure: CATARACT EXTRACTION PHACO AND INTRAOCULAR LENS PLACEMENT ([place]);  Surgeon: Williams Che;  [facility];  Service: Ophthalmology;  Laterality: Left;   COLONOSCOPY W/ POLYPECTOMY  06/20/10   [facility], [place]   HIP ARTHROPLASTY Left 02/18/2020   Procedure: ARTHROPLASTY BIPOLAR HIP (HEMIARTHROPLASTY);  Surgeon: Mordecai Rasmussen, MD;  Location[facility];  Service: Orthopedics;  Laterality: Left;   Social History   Social History Narrative   Not on file   Immunization History  Administered Date(s) Administered   Fluad Quad(high Dose 65+) 11/19/2020   Moderna Sars-Covid-2 Vaccination 02/21/2020     Objective: Vital Signs: BP (!) 172/62 (BP Location: Left Arm, Patient Position: Sitting, Cuff Size: Small)    Pulse (!) 56    Resp 12    Ht _0  (1.702 m)    Wt 137 lb 6.4 oz (62.3 kg)    BMI 21.52 kg/m    Physical Exam Constitutional:      Comments: In wheelchair  HENT:     Head:     Comments: Easily palpable left superficial branch of TA, not right Eyes:     Comments: Blind  Musculoskeletal:     Right lower leg: No edema.     Left lower leg: No edema.  Skin:    General: Skin is warm and dry.     Comments: Mild bruises on dorsal side of wrists  Neurological:     Mental Status: She is alert.     Musculoskeletal Exam:  Elbows full ROM no tenderness or swelling Wrists full ROM no tenderness or swelling Fingers full ROM no tenderness or swelling Left worse than right mild hip pain with internal rotation Knees full ROM no tenderness or swelling Ankles full ROM no tenderness or swelling   Investigation: No additional findings.  Imaging: No results found.  Recent Labs: Lab Results  Component Value Date   WBC 9.5 12/29/2020   HGB 13.3 12/29/2020   PLT 305 12/29/2020   NA 137 12/15/2020   K 3.1 (L) 12/15/2020   CL 104 12/15/2020   CO2 23 12/15/2020   GLUCOSE 99 12/15/2020   BUN 12 12/15/2020   CREATININE 0.68 12/15/2020   BILITOT 0.8 12/14/2020   ALKPHOS 68 12/14/2020   AST 14 (L) 12/14/2020  ALT 19 12/14/2020   PROT 5.4 (L) 12/14/2020   ALBUMIN 2.7 (L) 12/14/2020   CALCIUM 8.9 12/15/2020   GFRAA >60 09/12/2010    Speciality Comments: No specialty comments available.  Procedures:  No procedures performed Allergies: Patient has no known allergies.   Assessment / Plan:     Visit Diagnoses: Temporal arteritis ([place]) - Plan: Sedimentation rate, C-reactive protein, predniSONE (DELTASONE) 5 MG tablet, predniSONE (DELTASONE) 1 MG tablet  No obvious signs of inflammation returning with prednisone taper so far down to 12.5 mg.  We will recheck sedimentation rate and CRP.  Assuming these also look okay we will continue the taper next steps down to 10 mg and then with a 1 mg daily dose per month slow taper over time for planned 1 year total steroid treatment.  We will monitor again in 3 months follow-up.  Type 2 diabetes mellitus with hyperglycemia, with long-term current use of insulin ([place])  She is managing treatment with primary team, insulin requirements likely to continue decreasing some more over time with prednisone taper.   Orders: Orders Placed This Encounter  Procedures   Sedimentation rate   C-reactive protein   Meds ordered this encounter   Medications   predniSONE (DELTASONE) 5 MG tablet    Sig: Take in combination with 1 mg tablets for total daily dose: 10 mg then decrease by 1 mg/day per month    Dispense:  60 tablet    Refill:  1   predniSONE (DELTASONE) 1 MG tablet    Sig: Take in combination with 5 mg tablets for total daily dose: 10 mg then decrease by 1 mg/day per month    Dispense:  120 tablet    Refill:  1     Follow-Up Instructions: Return in about 2 months (around 05/06/2021) for GCA on GC taper f/u 26mo.   CCollier Salina MD  Note - This record has been created using [employer]  Chart creation errors have been sought, but may not always  have been located. Such creation errors do not reflect on  the standard of medical care.

## 2021-03-08 ENCOUNTER — Ambulatory Visit (INDEPENDENT_AMBULATORY_CARE_PROVIDER_SITE_OTHER): Payer: PPO | Admitting: Internal Medicine

## 2021-03-08 ENCOUNTER — Other Ambulatory Visit: Payer: Self-pay

## 2021-03-08 ENCOUNTER — Encounter: Payer: Self-pay | Admitting: Internal Medicine

## 2021-03-08 VITALS — BP 172/62 | HR 56 | Resp 12 | Ht 67.0 in | Wt 137.4 lb

## 2021-03-08 DIAGNOSIS — E1165 Type 2 diabetes mellitus with hyperglycemia: Secondary | ICD-10-CM

## 2021-03-08 DIAGNOSIS — M316 Other giant cell arteritis: Secondary | ICD-10-CM

## 2021-03-08 DIAGNOSIS — Z794 Long term (current) use of insulin: Secondary | ICD-10-CM

## 2021-03-09 LAB — C-REACTIVE PROTEIN: CRP: 2 mg/L (ref <8.0)

## 2021-03-09 LAB — SEDIMENTATION RATE: Sed Rate: 6 mm/h (ref 0–30)

## 2021-03-10 MED ORDER — PREDNISONE 5 MG PO TABS: ORAL_TABLET | ORAL | 1 refills | Status: DC

## 2021-03-10 MED ORDER — PREDNISONE 1 MG PO TABS: ORAL_TABLET | ORAL | 1 refills | Status: DC

## 2021-03-10 NOTE — Progress Notes (Signed)
Inflammatory markers are completely normal she should be able to safely continue tapering down the prednisone dose. Can decrease to 10 mg daily prednisone for 1 month and from then decrease by 1 mg from the dose per month going forwards. She can use the current 10 mg tablets or take two 5 mg tablets for this first month. Afterwards combination of the 5 mg and 1 mg prednisone tablets, I sent prescriptions to the pharmacy for this.

## 2021-03-24 ENCOUNTER — Emergency Department (HOSPITAL_COMMUNITY): Payer: PPO

## 2021-03-24 ENCOUNTER — Encounter (HOSPITAL_COMMUNITY): Payer: Self-pay | Admitting: *Deleted

## 2021-03-24 ENCOUNTER — Inpatient Hospital Stay (HOSPITAL_COMMUNITY)
Admission: EM | Admit: 2021-03-24 | Discharge: 2021-03-28 | DRG: 948 | Disposition: A | Discharge status: Home Health | Payer: PPO | Attending: Internal Medicine | Admitting: Internal Medicine

## 2021-03-24 ENCOUNTER — Other Ambulatory Visit: Payer: Self-pay

## 2021-03-24 DIAGNOSIS — Z7984 Long term (current) use of oral hypoglycemic drugs: Secondary | ICD-10-CM | POA: Diagnosis not present

## 2021-03-24 DIAGNOSIS — R441 Visual hallucinations: Secondary | ICD-10-CM | POA: Diagnosis not present

## 2021-03-24 DIAGNOSIS — Z66 Do not resuscitate: Secondary | ICD-10-CM | POA: Diagnosis present

## 2021-03-24 DIAGNOSIS — E782 Mixed hyperlipidemia: Secondary | ICD-10-CM | POA: Diagnosis present

## 2021-03-24 DIAGNOSIS — Z20822 Contact with and (suspected) exposure to covid-19: Secondary | ICD-10-CM | POA: Diagnosis present

## 2021-03-24 DIAGNOSIS — E785 Hyperlipidemia, unspecified: Secondary | ICD-10-CM | POA: Diagnosis present

## 2021-03-24 DIAGNOSIS — M316 Other giant cell arteritis: Secondary | ICD-10-CM | POA: Diagnosis present

## 2021-03-24 DIAGNOSIS — R519 Headache, unspecified: Secondary | ICD-10-CM | POA: Diagnosis not present

## 2021-03-24 DIAGNOSIS — Z823 Family history of stroke: Secondary | ICD-10-CM | POA: Diagnosis not present

## 2021-03-24 DIAGNOSIS — G4489 Other headache syndrome: Secondary | ICD-10-CM | POA: Diagnosis not present

## 2021-03-24 DIAGNOSIS — N390 Urinary tract infection, site not specified: Secondary | ICD-10-CM | POA: Diagnosis present

## 2021-03-24 DIAGNOSIS — R4182 Altered mental status, unspecified: Secondary | ICD-10-CM | POA: Diagnosis present

## 2021-03-24 DIAGNOSIS — R442 Other hallucinations: Secondary | ICD-10-CM | POA: Diagnosis not present

## 2021-03-24 DIAGNOSIS — D649 Anemia, unspecified: Secondary | ICD-10-CM | POA: Diagnosis present

## 2021-03-24 DIAGNOSIS — I169 Hypertensive crisis, unspecified: Secondary | ICD-10-CM | POA: Diagnosis present

## 2021-03-24 DIAGNOSIS — N3000 Acute cystitis without hematuria: Secondary | ICD-10-CM | POA: Diagnosis not present

## 2021-03-24 DIAGNOSIS — I110 Hypertensive heart disease with heart failure: Secondary | ICD-10-CM | POA: Diagnosis present

## 2021-03-24 DIAGNOSIS — I10 Essential (primary) hypertension: Secondary | ICD-10-CM | POA: Diagnosis not present

## 2021-03-24 DIAGNOSIS — E119 Type 2 diabetes mellitus without complications: Secondary | ICD-10-CM

## 2021-03-24 DIAGNOSIS — Z8673 Personal history of transient ischemic attack (TIA), and cerebral infarction without residual deficits: Secondary | ICD-10-CM | POA: Diagnosis not present

## 2021-03-24 DIAGNOSIS — E860 Dehydration: Secondary | ICD-10-CM | POA: Diagnosis present

## 2021-03-24 DIAGNOSIS — Z79899 Other long term (current) drug therapy: Secondary | ICD-10-CM | POA: Diagnosis not present

## 2021-03-24 DIAGNOSIS — E1165 Type 2 diabetes mellitus with hyperglycemia: Secondary | ICD-10-CM

## 2021-03-24 DIAGNOSIS — R739 Hyperglycemia, unspecified: Secondary | ICD-10-CM | POA: Diagnosis not present

## 2021-03-24 DIAGNOSIS — R410 Disorientation, unspecified: Secondary | ICD-10-CM | POA: Diagnosis not present

## 2021-03-24 DIAGNOSIS — H548 Legal blindness, as defined in USA: Secondary | ICD-10-CM | POA: Diagnosis present

## 2021-03-24 DIAGNOSIS — Z794 Long term (current) use of insulin: Secondary | ICD-10-CM | POA: Diagnosis not present

## 2021-03-24 DIAGNOSIS — R2990 Unspecified symptoms and signs involving the nervous system: Secondary | ICD-10-CM

## 2021-03-24 DIAGNOSIS — H5461 Unqualified visual loss, right eye, normal vision left eye: Secondary | ICD-10-CM | POA: Diagnosis present

## 2021-03-24 DIAGNOSIS — F05 Delirium due to known physiological condition: Secondary | ICD-10-CM | POA: Diagnosis not present

## 2021-03-24 DIAGNOSIS — M315 Giant cell arteritis with polymyalgia rheumatica: Secondary | ICD-10-CM | POA: Diagnosis present

## 2021-03-24 DIAGNOSIS — G9341 Metabolic encephalopathy: Secondary | ICD-10-CM | POA: Diagnosis not present

## 2021-03-24 DIAGNOSIS — R339 Retention of urine, unspecified: Secondary | ICD-10-CM | POA: Diagnosis present

## 2021-03-24 DIAGNOSIS — I5032 Chronic diastolic (congestive) heart failure: Secondary | ICD-10-CM | POA: Diagnosis not present

## 2021-03-24 DIAGNOSIS — N179 Acute kidney failure, unspecified: Secondary | ICD-10-CM | POA: Diagnosis present

## 2021-03-24 DIAGNOSIS — Z8249 Family history of ischemic heart disease and other diseases of the circulatory system: Secondary | ICD-10-CM

## 2021-03-24 DIAGNOSIS — Z7902 Long term (current) use of antithrombotics/antiplatelets: Secondary | ICD-10-CM

## 2021-03-24 DIAGNOSIS — R531 Weakness: Secondary | ICD-10-CM | POA: Diagnosis not present

## 2021-03-24 DIAGNOSIS — G319 Degenerative disease of nervous system, unspecified: Secondary | ICD-10-CM | POA: Diagnosis not present

## 2021-03-24 DIAGNOSIS — E876 Hypokalemia: Secondary | ICD-10-CM | POA: Diagnosis present

## 2021-03-24 DIAGNOSIS — R54 Age-related physical debility: Secondary | ICD-10-CM | POA: Diagnosis present

## 2021-03-24 DIAGNOSIS — I6330 Cerebral infarction due to thrombosis of unspecified cerebral artery: Secondary | ICD-10-CM

## 2021-03-24 DIAGNOSIS — I639 Cerebral infarction, unspecified: Secondary | ICD-10-CM | POA: Diagnosis not present

## 2021-03-24 LAB — COMPREHENSIVE METABOLIC PANEL
ALT: 25 U/L (ref 0–44)
AST: 16 U/L (ref 15–41)
Albumin: 4.2 g/dL (ref 3.5–5.0)
Alkaline Phosphatase: 73 U/L (ref 38–126)
Anion gap: 11 (ref 5–15)
BUN: 16 mg/dL (ref 8–23)
CO2: 28 mmol/L (ref 22–32)
Calcium: 9.6 mg/dL (ref 8.9–10.3)
Chloride: 100 mmol/L (ref 98–111)
Creatinine, Ser: 0.72 mg/dL (ref 0.44–1.00)
GFR, Estimated: >60 mL/min (ref >60)
Glucose, Bld: 214 mg/dL — ABNORMAL HIGH (ref 70–99)
Potassium: 3.6 mmol/L (ref 3.5–5.1)
Sodium: 139 mmol/L (ref 135–145)
Total Bilirubin: 0.5 mg/dL (ref 0.3–1.2)
Total Protein: 8.3 g/dL — ABNORMAL HIGH (ref 6.5–8.1)

## 2021-03-24 LAB — RESP PANEL BY RT-PCR (FLU A&B, COVID) ARPGX2
Influenza A by PCR: NEGATIVE
Influenza B by PCR: NEGATIVE
SARS Coronavirus 2 by RT PCR: NEGATIVE

## 2021-03-24 LAB — PROTIME-INR
INR: 0.9 (ref 0.8–1.2)
Prothrombin Time: 12.4 seconds (ref 11.4–15.2)

## 2021-03-24 LAB — RAPID URINE DRUG SCREEN, HOSP PERFORMED
Amphetamines: NOT DETECTED
Barbiturates: NOT DETECTED
Benzodiazepines: NOT DETECTED
Cocaine: NOT DETECTED
Opiates: NOT DETECTED
Tetrahydrocannabinol: NOT DETECTED

## 2021-03-24 LAB — URINALYSIS, ROUTINE W REFLEX MICROSCOPIC
Bilirubin Urine: NEGATIVE
Glucose, UA: 100 mg/dL — AB
Hgb urine dipstick: NEGATIVE
Ketones, ur: NEGATIVE mg/dL
Leukocytes,Ua: NEGATIVE
Nitrite: POSITIVE — AB
Protein, ur: NEGATIVE mg/dL
Specific Gravity, Urine: 1.01 (ref 1.005–1.030)
pH: 7 (ref 5.0–8.0)

## 2021-03-24 LAB — URINALYSIS, MICROSCOPIC (REFLEX)

## 2021-03-24 LAB — CBC
HCT: 42 % (ref 36.0–46.0)
Hemoglobin: 14.1 g/dL (ref 12.0–15.0)
MCH: 32.4 pg (ref 26.0–34.0)
MCHC: 33.6 g/dL (ref 30.0–36.0)
MCV: 96.6 fL (ref 80.0–100.0)
Platelets: 292 10*3/uL (ref 150–400)
RBC: 4.35 MIL/uL (ref 3.87–5.11)
RDW: 13.9 % (ref 11.5–15.5)
WBC: 6.9 10*3/uL (ref 4.0–10.5)
nRBC: 0 % (ref 0.0–0.2)

## 2021-03-24 LAB — DIFFERENTIAL
Abs Immature Granulocytes: 0.05 10*3/uL (ref 0.00–0.07)
Basophils Absolute: 0 10*3/uL (ref 0.0–0.1)
Basophils Relative: 0 %
Eosinophils Absolute: 0 10*3/uL (ref 0.0–0.5)
Eosinophils Relative: 0 %
Immature Granulocytes: 1 %
Lymphocytes Relative: 35 %
Lymphs Abs: 2.4 10*3/uL (ref 0.7–4.0)
Monocytes Absolute: 0.7 10*3/uL (ref 0.1–1.0)
Monocytes Relative: 10 %
Neutro Abs: 3.7 10*3/uL (ref 1.7–7.7)
Neutrophils Relative %: 54 %

## 2021-03-24 LAB — ETHANOL: Alcohol, Ethyl (B): <10 mg/dL (ref <10)

## 2021-03-24 LAB — APTT: aPTT: 23 seconds — ABNORMAL LOW (ref 24–36)

## 2021-03-24 IMAGING — DX DG CHEST 2V
2 series · 3 of 3 positions shown · non-contrast
Comparison: [DATE]

CLINICAL DATA: Altered mental status

EXAM:
CHEST - 2 VIEW

[Series 2: chest lat · 0.14mm/px · 2 of 2 slices shown]
[im 1/2]
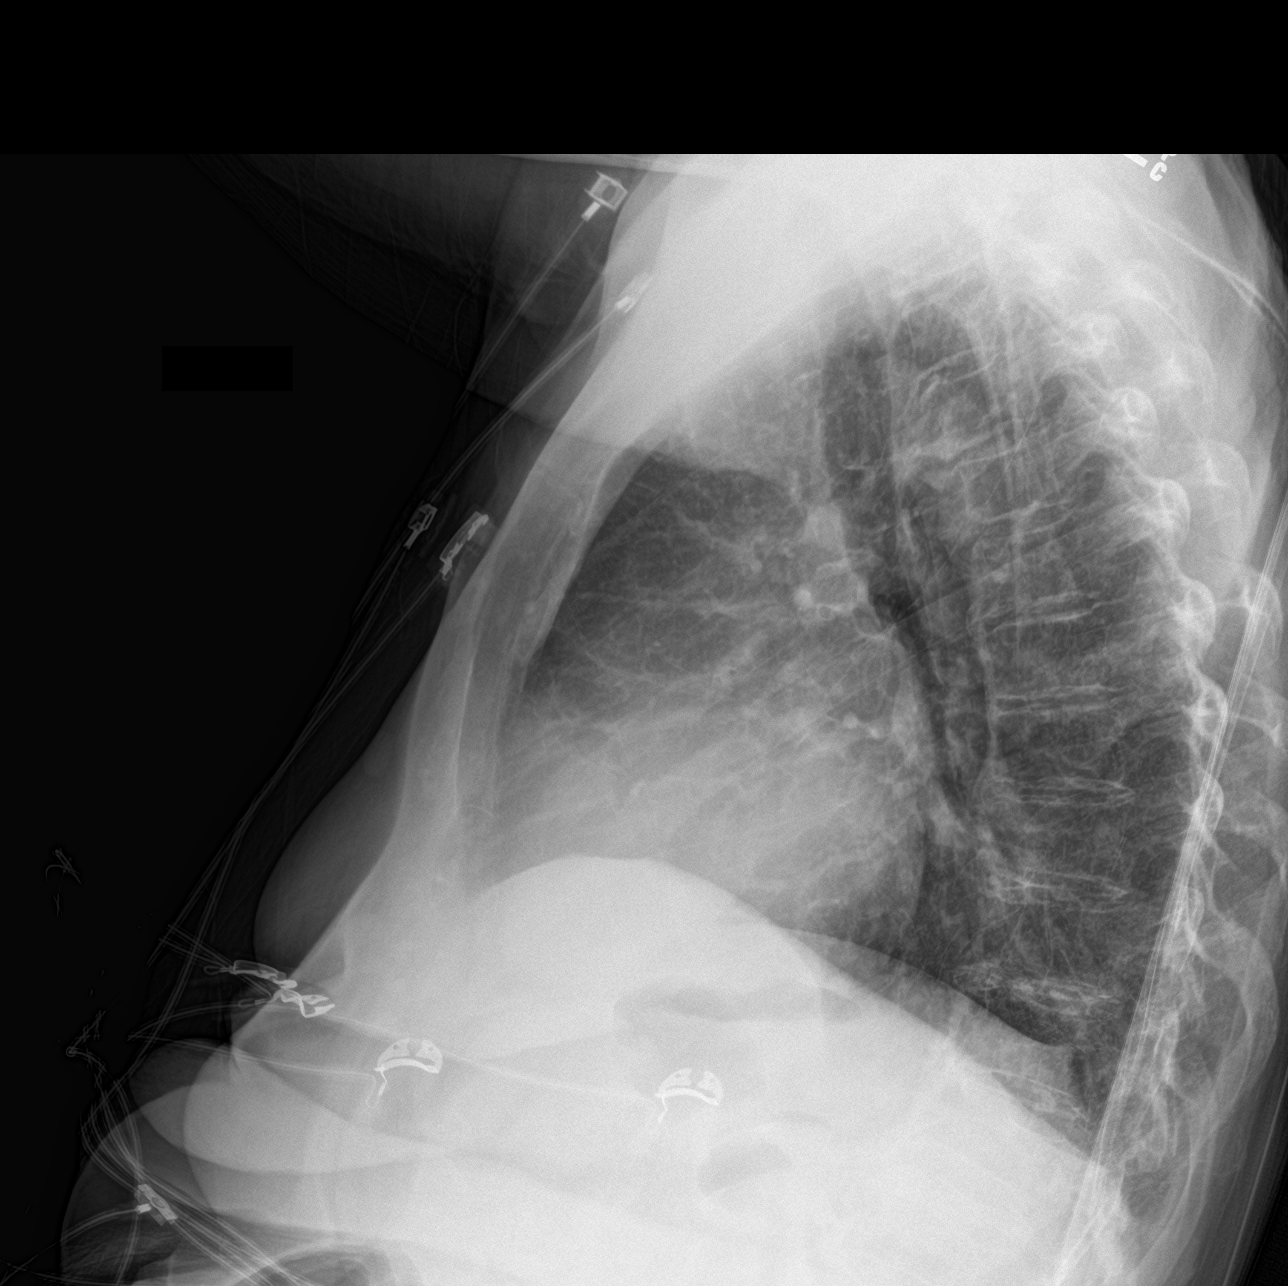
[im 2/2]
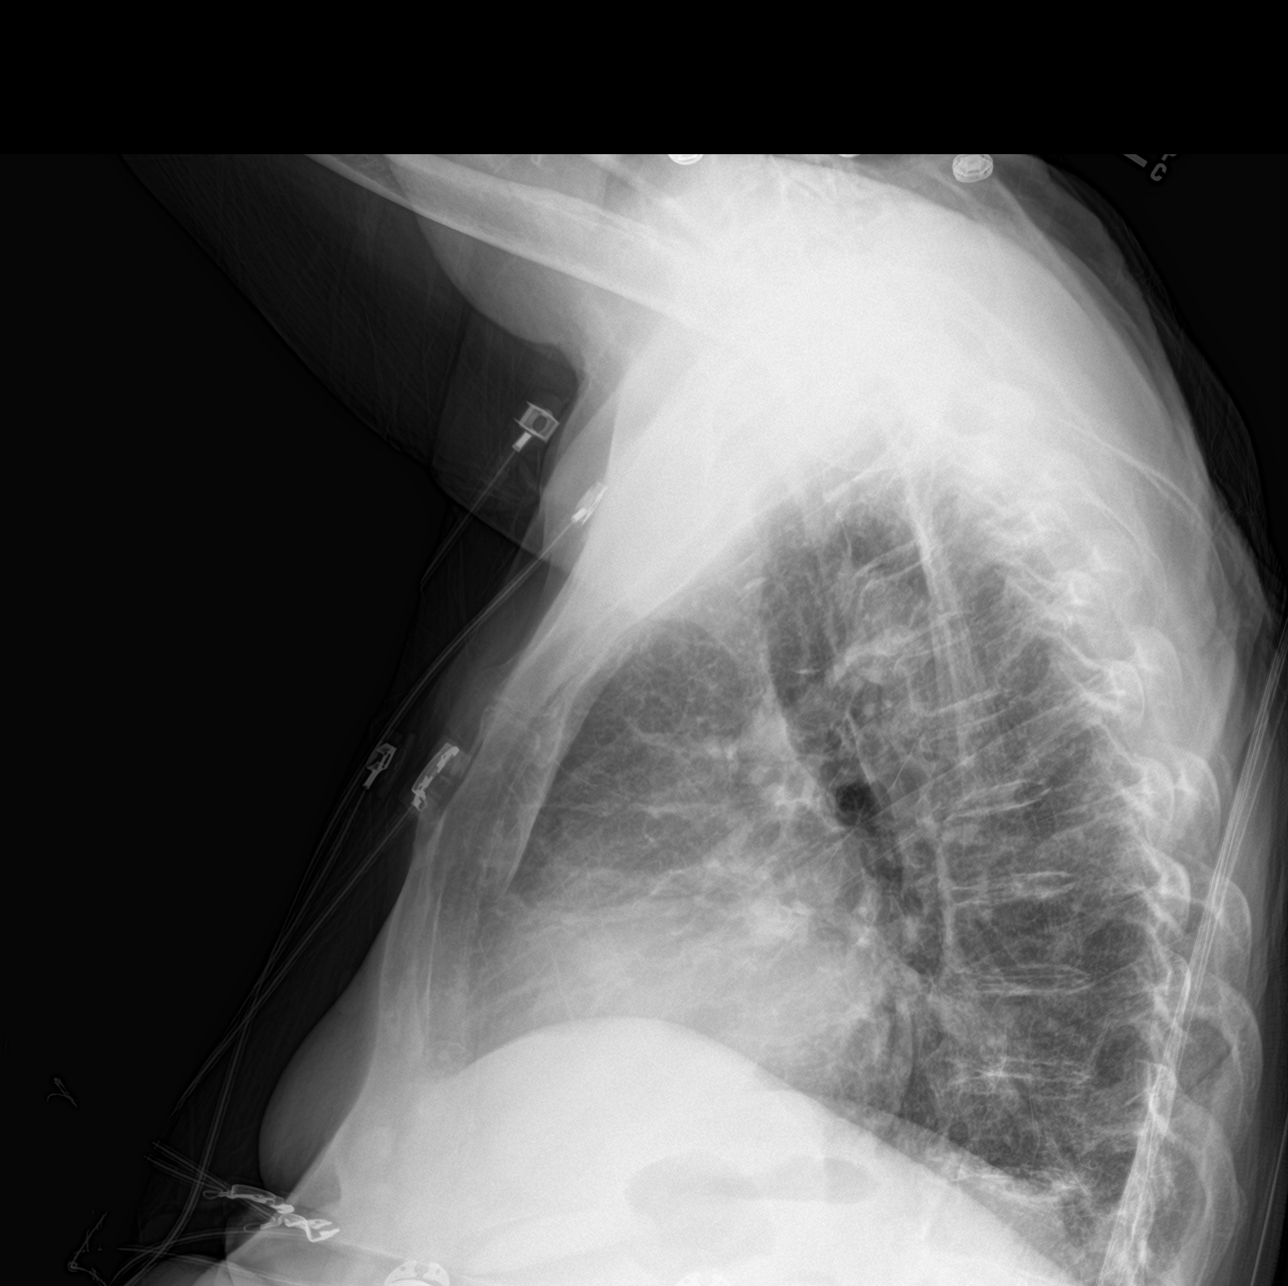

[chest ap]
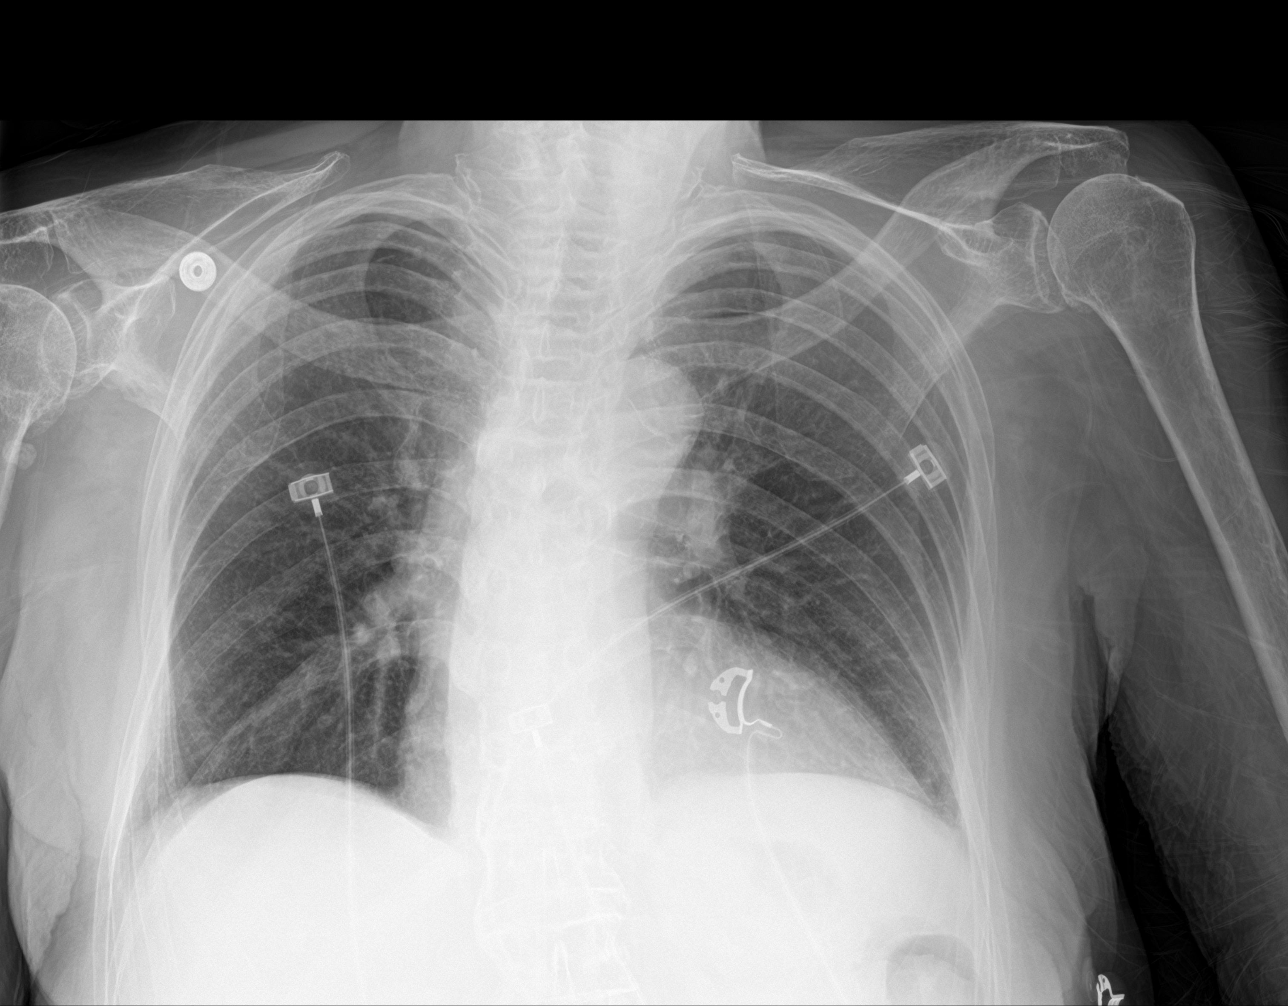

[3 of 3 positions shown; findings below may reference images not displayed]

FINDINGS: Cardiac and mediastinal contours are unchanged, when accounting for
low lung volumes. No focal pulmonary opacity. No pleural effusion or
pneumothorax. No acute osseous abnormality.
IMPRESSION: No acute cardiopulmonary process.

## 2021-03-24 IMAGING — CT CT HEAD W/O CM
3 series · 15 of 47 positions shown, 18 images · non-contrast
Comparison: Brain MRI [DATE]. CT angiogram head/neck
[DATE].

CLINICAL DATA: Mental status change, unknown cause. Additional
history provided: Headache, elevated blood pressure.



[Series 2: head w o · axial · 0.47mm/px · z∈[+45,+180]mm · 9 of 33 slices shown, 12 images]
[im 3/33  brain]
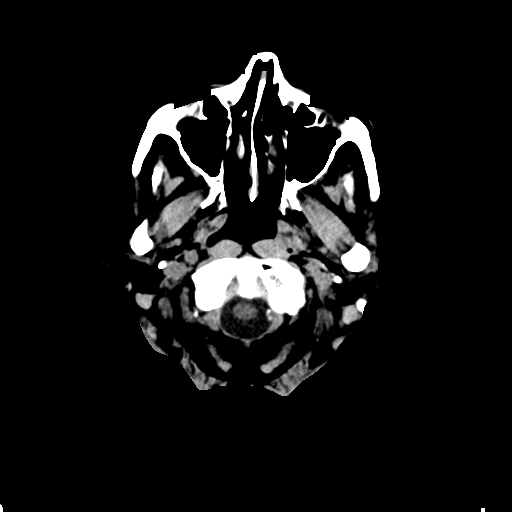
[im 3/33  bone]
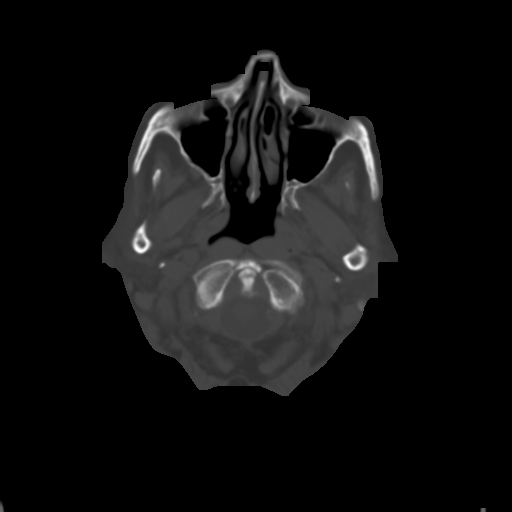
[im 6/33  brain]
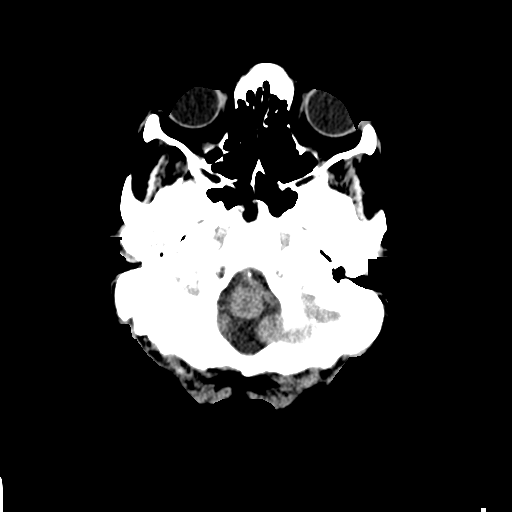
[im 9/33  brain]
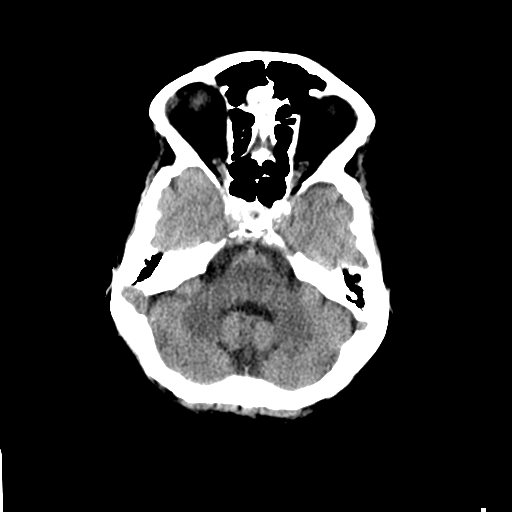
[im 13/33  brain]
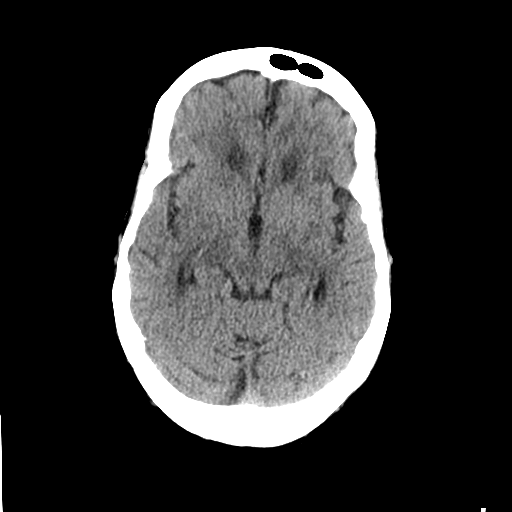
[im 17/33  brain]
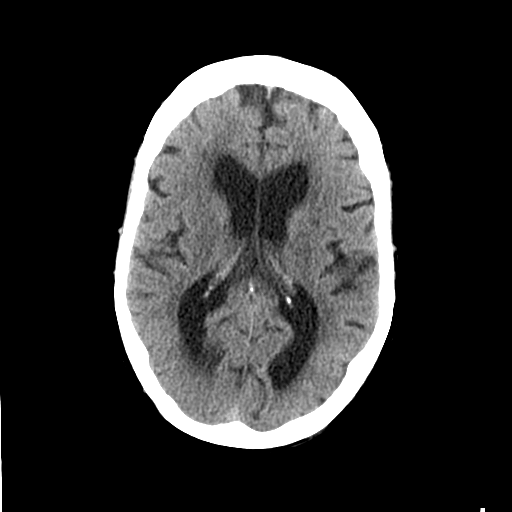
[im 17/33  bone]
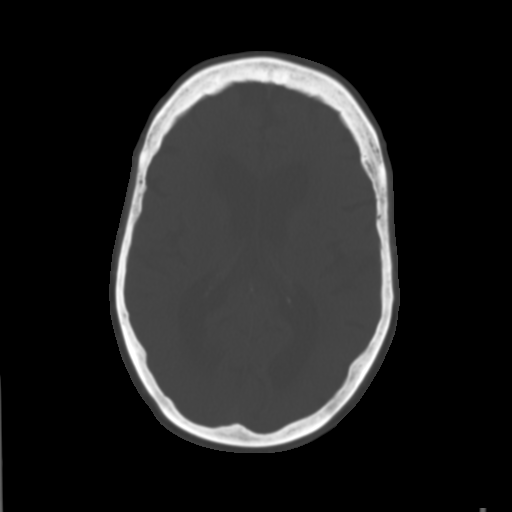
[im 20/33  brain]
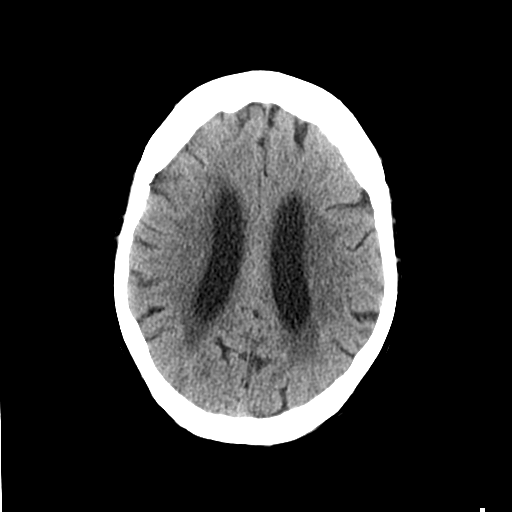
[im 24/33  brain]
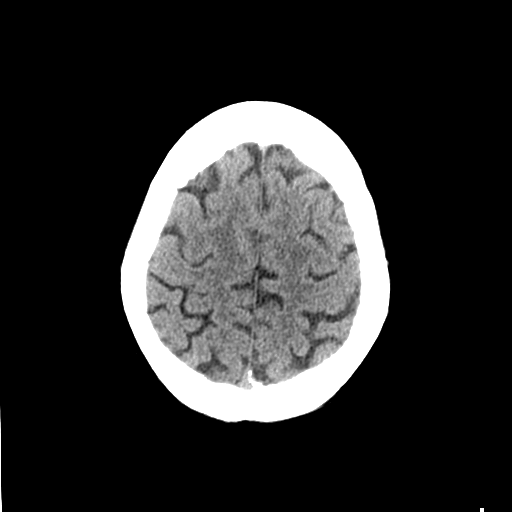
[im 27/33  brain]
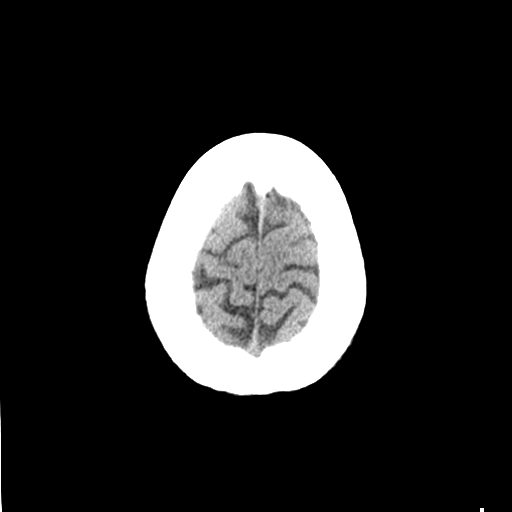
[im 30/33  brain]
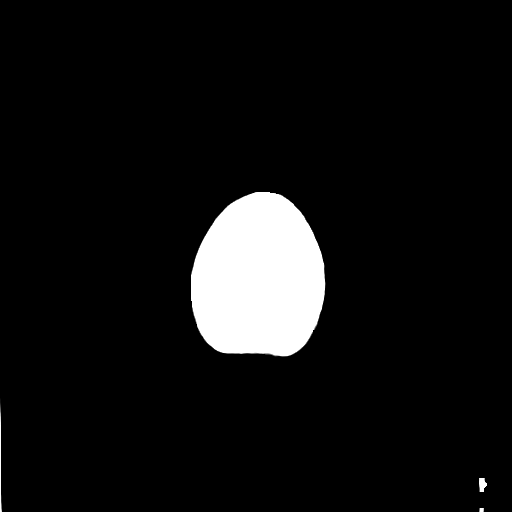
[im 30/33  bone]
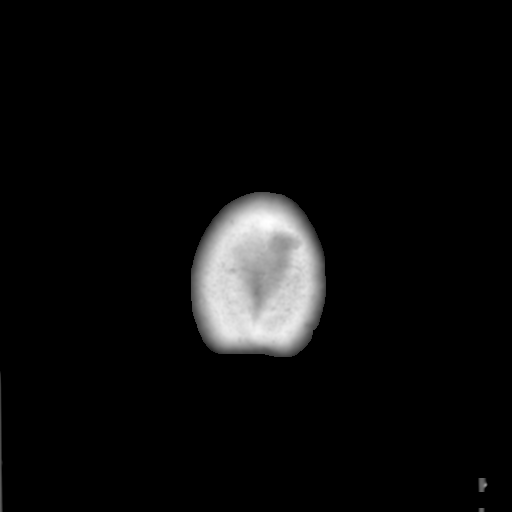

[Series 4: coronal soft · coronal · 0.33mm/px · 3 of 70 slices shown]
[im 24/70  brain]
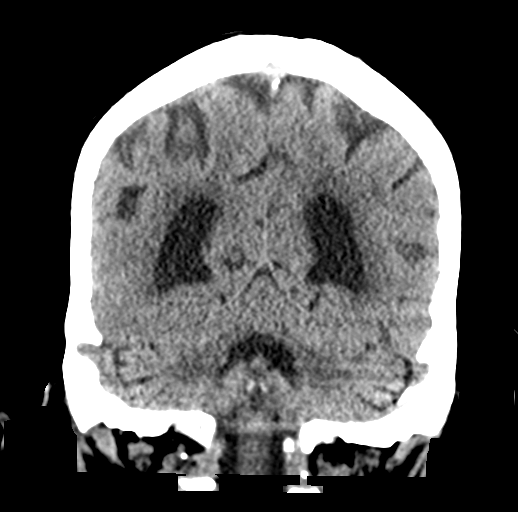
[im 31/70  brain]
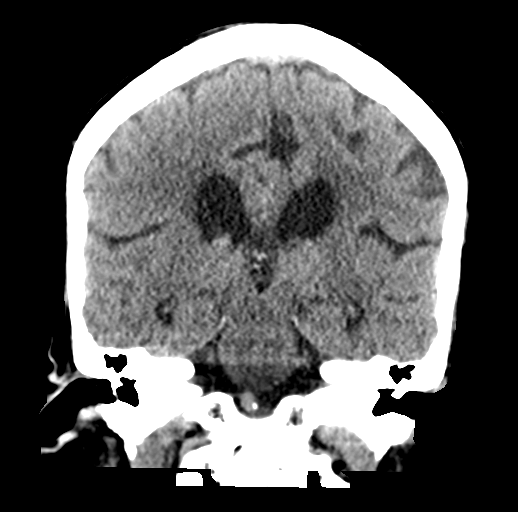
[im 39/70  brain]
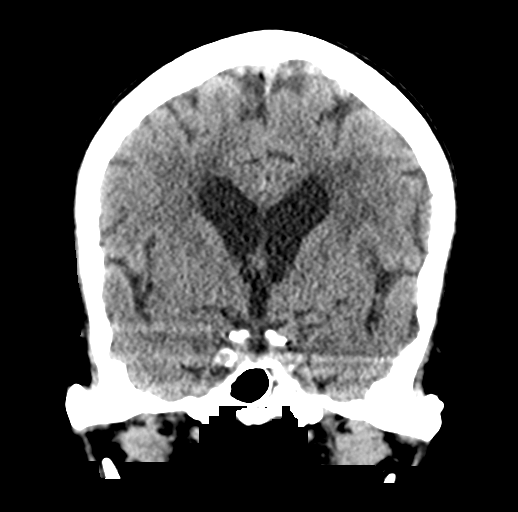

[Series 5: sagittal soft · sagittal · 0.33mm/px · 3 of 58 slices shown]
[im 20/58  brain]
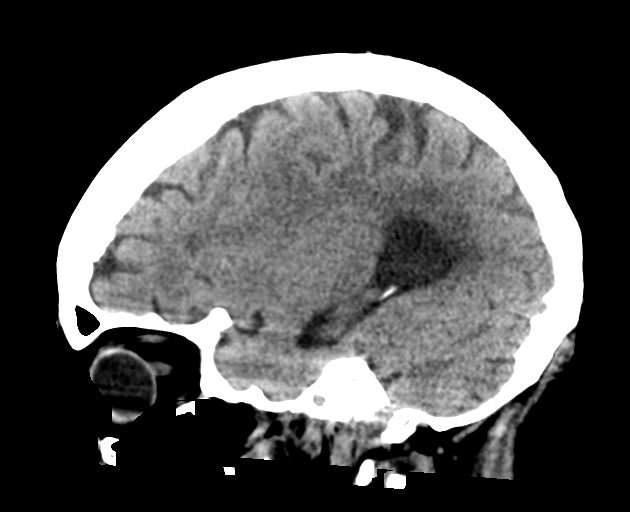
[im 29/58  brain]
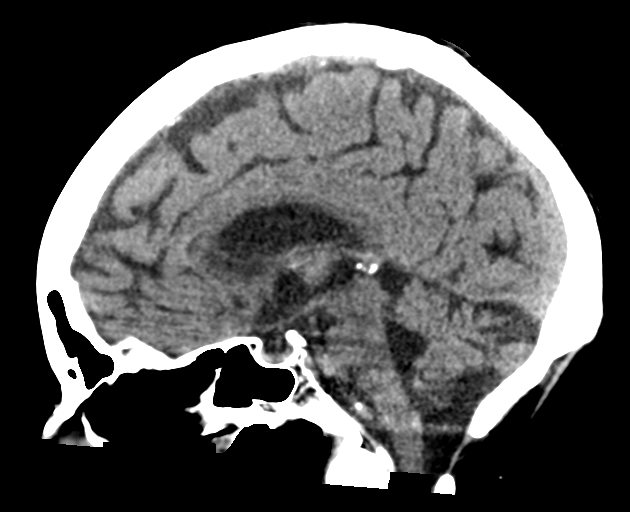
[im 39/58  brain]
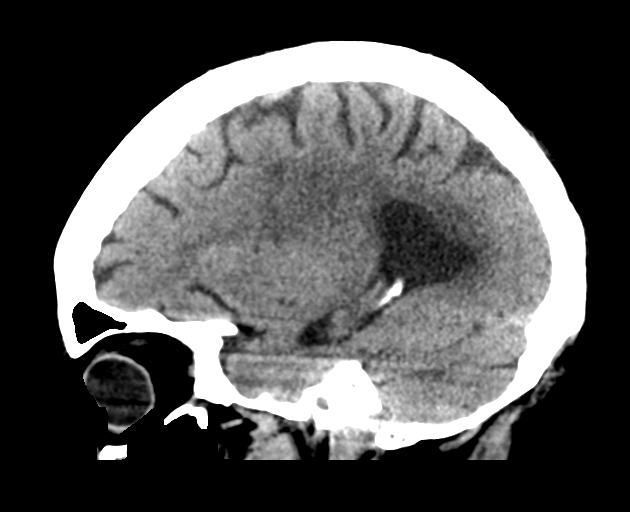

[15 of 47 positions shown; findings below may reference images not displayed]

FINDINGS: Brain:

Mild generalized cerebral and cerebellar atrophy. Commensurate
prominence of the ventricles and sulci.

Mild to moderate patchy and ill-defined hypoattenuation within the
cerebral white matter, nonspecific but compatible with chronic small
vessel ischemic disease.

There is no acute intracranial hemorrhage.

No demarcated cortical infarct.

No extra-axial fluid collection.

No evidence of an intracranial mass.

No midline shift.

Vascular: No hyperdense vessel.  Atherosclerotic calcifications.

Skull: Normal. Negative for fracture or focal lesion.

Sinuses/Orbits: No acute orbital finding. Mild mucosal thickening
within the bilateral ethmoid sinuses.
IMPRESSION: No evidence of acute intracranial abnormality.

Mild to moderate chronic small vessel ischemic changes within the
cerebral white matter, stable.

Mild generalized cerebral and cerebellar atrophy.

Mild mucosal thickening within the bilateral ethmoid sinuses.

## 2021-03-24 MED ORDER — CLOPIDOGREL BISULFATE 75 MG PO TABS
75.0000 mg | ORAL_TABLET | Freq: Every day | ORAL | Status: DC
Start: 2021-03-25 — End: 2021-03-28
  Administered 2021-03-26 – 2021-03-28 (×3): 75 mg via ORAL
  Filled 2021-03-24 (×4): qty 1

## 2021-03-24 MED ORDER — HYDRALAZINE HCL 20 MG/ML IJ SOLN
5.0000 mg | Freq: Four times a day (QID) | INTRAMUSCULAR | Status: DC | PRN
  Administered 2021-03-24: 5 mg via INTRAVENOUS
  Filled 2021-03-24 (×2): qty 1

## 2021-03-24 MED ORDER — ADULT MULTIVITAMIN W/MINERALS CH
1.0000 | ORAL_TABLET | Freq: Every day | ORAL | Status: DC
  Administered 2021-03-26 – 2021-03-28 (×3): 1 via ORAL
  Filled 2021-03-24 (×4): qty 1

## 2021-03-24 MED ORDER — HEPARIN SODIUM (PORCINE) 5000 UNIT/ML IJ SOLN
5000.0000 [IU] | Freq: Three times a day (TID) | INTRAMUSCULAR | Status: DC
  Administered 2021-03-24 – 2021-03-26 (×6): 5000 [IU] via SUBCUTANEOUS
  Filled 2021-03-24 (×6): qty 1

## 2021-03-24 MED ORDER — INSULIN GLARGINE-YFGN 100 UNIT/ML ~~LOC~~ SOLN
8.0000 [IU] | Freq: Every day | SUBCUTANEOUS | Status: DC
  Administered 2021-03-25 – 2021-03-28 (×4): 8 [IU] via SUBCUTANEOUS
  Filled 2021-03-24 (×6): qty 0.08

## 2021-03-24 MED ORDER — ATORVASTATIN CALCIUM 40 MG PO TABS
40.0000 mg | ORAL_TABLET | Freq: Every day | ORAL | Status: DC
  Administered 2021-03-26 – 2021-03-28 (×3): 40 mg via ORAL
  Filled 2021-03-24 (×4): qty 1

## 2021-03-24 MED ORDER — PREDNISONE 5 MG PO TABS
10.0000 mg | ORAL_TABLET | Freq: Every day | ORAL | Status: DC
  Administered 2021-03-26 – 2021-03-28 (×3): 10 mg via ORAL
  Filled 2021-03-24: qty 1
  Filled 2021-03-24 (×4): qty 2

## 2021-03-24 MED ORDER — DORZOLAMIDE HCL 2 % OP SOLN
1.0000 [drp] | Freq: Two times a day (BID) | OPHTHALMIC | Status: DC
  Administered 2021-03-24 – 2021-03-28 (×8): 1 [drp] via OPHTHALMIC
  Filled 2021-03-24 (×2): qty 10

## 2021-03-24 MED ORDER — SODIUM CHLORIDE 0.9 % IV SOLN: INTRAVENOUS | Status: DC

## 2021-03-24 MED ORDER — SENNOSIDES-DOCUSATE SODIUM 8.6-50 MG PO TABS: 1.0000 | ORAL_TABLET | Freq: Every evening | ORAL | Status: DC | PRN

## 2021-03-24 MED ORDER — STROKE: EARLY STAGES OF RECOVERY BOOK
Freq: Once | Status: DC
  Filled 2021-03-24: qty 1

## 2021-03-24 MED ORDER — ACETAMINOPHEN 325 MG PO TABS
325.0000 mg | ORAL_TABLET | Freq: Four times a day (QID) | ORAL | Status: DC | PRN
  Administered 2021-03-26: 650 mg via ORAL
  Filled 2021-03-24: qty 2

## 2021-03-24 MED ORDER — ASPIRIN 81 MG PO CHEW
324.0000 mg | CHEWABLE_TABLET | Freq: Once | ORAL | Status: AC
  Administered 2021-03-24: 324 mg via ORAL
  Filled 2021-03-24: qty 4

## 2021-03-24 NOTE — ED Provider Notes (Signed)
Emergency Department Provider Note   I have reviewed the triage vital signs and the nursing notes.   HISTORY  Chief Complaint Hallucinations   HPI Jenna Long is a 87 y.o. female with prior history reviewed below including temporal arteritis, diabetes, prior stroke presents to the emergency department with mental status change.  Last normal at 11 PM yesterday according to her daughter by phone.  Since that time, she is had some confusion as well as visual hallucinations.  Patient tells me that she saw bugs flying around her room and also a man in her room.  She tells me that she now knows that the bugs are actually lint blowing around in her bedroom.  She tells me "my daughter says I've been talking off the wall."  She notes some mild frontal headache but no sudden onset, maximal intensity headache symptoms.  No chest pain, abdominal discomfort, numbness/weakness.  The daughter, by phone, tells me that she not only seemed confused but also had some trouble getting around today which is unusual for her and ultimately prompted her to present to the emergency department.   Past Medical History:  Diagnosis Date   Body mass index (BMI) 21.0-21.9, adult    Congestive heart failure (CHF) ([place])    Diabetes mellitus type II    DM (diabetes mellitus) ([place])    DNR (do not resuscitate) 11/08/2020   Hypercholesteremia    Hyperlipidemia    Hypertension    Osteoarthritis    Overweight    Polymyalgia rheumatica ([place])    Sequela of cerebrovascular accident    Temporal arteritis ([place])     Review of Systems  Constitutional: No fever/chills. Positive fatigue.  Eyes: No visual changes. ENT: No sore throat. Cardiovascular: Denies chest pain. Respiratory: Denies shortness of breath. Gastrointestinal: No abdominal pain.  No nausea, no vomiting.  No diarrhea.  No constipation. Genitourinary: Negative for dysuria. Musculoskeletal: Negative for back pain. Skin: Negative for  rash. Neurological: Negative for headaches, focal weakness or numbness. Positive visual hallucinations and confusion.    ____________________________________________   PHYSICAL EXAM:  VITAL SIGNS: ED Triage Vitals [03/24/21 1852]  Enc Vitals Group     BP (!) 200/79     Pulse Rate (!) 59     Resp 19     Temp 97.9 F (36.6 C)     Temp Source Oral     SpO2 98 %   Constitutional: Alert and oriented to person and place. Well appearing and in no acute distress. Eyes: Conjunctivae are normal. Baseline decreased visual acuity.  Head: Atraumatic. Nose: No congestion/rhinnorhea. Mouth/Throat: Mucous membranes are moist.  Neck: No stridor.   Cardiovascular: Normal rate, regular rhythm. Good peripheral circulation. Grossly normal heart sounds.   Respiratory: Normal respiratory effort.  No retractions. Lungs CTAB. Gastrointestinal: Soft and nontender. No distention.  Musculoskeletal: No lower extremity tenderness nor edema. No gross deformities of extremities. Neurologic:  Normal speech and language.  No obvious facial droop.  Patient has slightly decreased grip strength on the left compared to the right 4+/5. No pronator drift.  Finger-to-nose testing limited by patient's poor eyesight at base. Left leg also slightly weak (4+/5) compared to the right.  Skin:  Skin is warm, dry and intact. No rash noted.   ____________________________________________   LABS (all labs ordered are listed, but only abnormal results are displayed)  Labs Reviewed  APTT - Abnormal; Notable for the following components:      Result Value   aPTT 23 (*)  All other components within normal limits  COMPREHENSIVE METABOLIC PANEL - Abnormal; Notable for the following components:   Glucose, Bld 214 (*)    Total Protein 8.3 (*)    All other components within normal limits  URINALYSIS, ROUTINE W REFLEX MICROSCOPIC - Abnormal; Notable for the following components:   Glucose, UA 100 (*)    Nitrite POSITIVE (*)     All other components within normal limits  HEMOGLOBIN A1C - Abnormal; Notable for the following components:   Hgb A1c MFr Bld 6.6 (*)    All other components within normal limits  LIPID PANEL - Abnormal; Notable for the following components:   Triglycerides 174 (*)    All other components within normal limits  URINALYSIS, MICROSCOPIC (REFLEX) - Abnormal; Notable for the following components:   Bacteria, UA MANY (*)    All other components within normal limits  BASIC METABOLIC PANEL - Abnormal; Notable for the following components:   Glucose, Bld 102 (*)    Creatinine, Ser 1.17 (*)    GFR, Estimated 46 (*)    All other components within normal limits  GLUCOSE, CAPILLARY - Abnormal; Notable for the following components:   Glucose-Capillary 120 (*)    All other components within normal limits  RESP PANEL BY RT-PCR (FLU A&B, COVID) ARPGX2  URINE CULTURE  CULTURE, BLOOD (ROUTINE X 2)  CULTURE, BLOOD (ROUTINE X 2)  ETHANOL  PROTIME-INR  CBC  DIFFERENTIAL  RAPID URINE DRUG SCREEN, HOSP PERFORMED  CBC WITH DIFFERENTIAL/PLATELET  MAGNESIUM   ____________________________________________  EKG   EKG Interpretation  Date/Time:  Saturday March 25 2021 08:39:17 EST Ventricular Rate:  59 PR Interval:  172 QRS Duration: 85 QT Interval:  462 QTC Calculation: 458 R Axis:   -11 Text Interpretation: Sinus rhythm Anterior infarct, old Minimal ST depression, lateral leads No significant change since last tracing Confirmed by Aletta Edouard [phone]) on 03/26/2021 10:46:18 AM        ____________________________________________  RADIOLOGY  MR BRAIN WO CONTRAST  Result Date: 03/25/2021 CLINICAL DATA:  Provided history: Transient ischemic attack (TIA), weakness, altered mental status. Additional history provided: History of temporal arteritis. EXAM: MRI HEAD WITHOUT CONTRAST TECHNIQUE: Multiplanar, multiecho pulse sequences of the brain and surrounding structures were obtained without  intravenous contrast. COMPARISON:  Head CT 03/24/2021.  Brain MRI 12/13/2020. FINDINGS: Brain: Mild generalized cerebral and cerebellar atrophy. 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 Redemonstrated tiny chronic infarct within the inferior right cerebellar hemisphere. There is no acute infarct. No evidence of an intracranial mass. No chronic intracranial blood products. No extra-axial fluid collection. No midline shift. Vascular: Maintained flow voids within the proximal large arterial vessels. Skull and upper cervical spine: No focal suspicious marrow lesion. Sinuses/Orbits: Visualized orbits show no acute finding. Trace scattered paranasal sinus mucosal thickening. IMPRESSION: No evidence of acute intracranial abnormality. Redemonstrated chronic lacunar infarct within the left corona radiata. Stable background mild-to-moderate chronic small-vessel ischemic changes within the cerebral white matter and pons. Redemonstrated tiny chronic infarct within the right cerebellar hemisphere. Mild generalized cerebral and cerebellar atrophy. Electronically Signed   By: Kellie Simmering D.O.   On: 03/25/2021 13:56    ____________________________________________   PROCEDURES  Procedure(s) performed:   Procedures  None ____________________________________________   INITIAL IMPRESSION / ASSESSMENT AND PLAN / ED COURSE  Pertinent labs & imaging results that were  available during my care of the patient were reviewed by me and considered in my medical decision making (see chart for details).   This patient is Presenting for Evaluation of AMS, which does require a range of treatment options, and is a complaint that involves a high risk of morbidity and mortality.  The Differential Diagnoses includes but is not  exclusive to alcohol, illicit or prescription medications, intracranial pathology such as stroke, intracerebral hemorrhage, fever or infectious causes including sepsis, hypoxemia, uremia, trauma, endocrine related disorders such as diabetes, hypoglycemia, thyroid-related diseases, etc.  .  Critical Interventions-    Medications  dorzolamide (TRUSOPT) 2 % ophthalmic solution 1 drop (1 drop Both Eyes Given 03/26/21 1015)  multivitamin with minerals tablet 1 tablet (1 tablet Oral Given 03/26/21 1013)  clopidogrel (PLAVIX) tablet 75 mg (75 mg Oral Given 03/26/21 1013)  predniSONE (DELTASONE) tablet 10 mg (10 mg Oral Given 03/26/21 0853)  insulin glargine-yfgn (SEMGLEE) injection 8 Units (8 Units Subcutaneous Given 03/26/21 1013)  atorvastatin (LIPITOR) tablet 40 mg (40 mg Oral Given 03/26/21 1013)  acetaminophen (TYLENOL) tablet 325-650 mg (650 mg Oral Given 03/26/21 0334)  0.9 %  sodium chloride infusion ( Intravenous Rate/Dose Change 03/26/21 0901)  senna-docusate (Senokot-S) tablet 1 tablet (has no administration in time range)  heparin injection 5,000 Units (5,000 Units Subcutaneous Given 03/26/21 0446)  hydrALAZINE (APRESOLINE) injection 10 mg (has no administration in time range)  amLODipine (NORVASC) tablet 10 mg (10 mg Oral Given 03/26/21 1013)  phenol (CHLORASEPTIC) mouth spray 1 spray (has no administration in time range)  insulin aspart (novoLOG) injection 0-9 Units (has no administration in time range)  insulin aspart (novoLOG) injection 0-5 Units (has no administration in time range)  aspirin chewable tablet 324 mg (324 mg Oral Given 03/24/21 2218)  haloperidol lactate (HALDOL) injection 2 mg (2 mg Intravenous Given 03/25/21 0208)  LORazepam (ATIVAN) injection 2 mg (2 mg Intravenous Given 03/25/21 0255)  diphenhydrAMINE (BENADRYL) injection 25 mg (25 mg Intravenous Given 03/25/21 0255)    Reassessment after intervention: Patient with unchanged mental status.    I did obtain Additional Historical  Information from patient's daughter.  I decided to review pertinent External Data, and in summary patient with stroke admit in October 2022.   Clinical Laboratory Tests Ordered, included No leukocytosis or anemia. No AKI. Question UTI on UA.   Radiologic Tests Ordered, included CT head and CXR. I independently interpreted the images and agree with radiology interpretation.   Cardiac Monitor Tracing which shows NSR   Social Determinants of Health Risk patient is a non-smoker.   Consult complete with Hospitalist for admit.   Medical Decision Making: Summary:  Presents emergency department with altered mental status.  She has some focal neurodeficits with some left arm/leg weakness on my exam.  This was not obvious to the patient or family.  Last normal at 11 PM yesterday and so is outside of the window for any TNK treatment consideration. No findings to suspect LVO.   Reevaluation with update and discussion with patient and daughter by phone. Plan for admit.   Disposition: Admit  ____________________________________________  FINAL CLINICAL IMPRESSION(S) / ED DIAGNOSES  Final diagnoses:  Visual hallucinations  Stroke-like symptoms    Note:  This document was prepared using Dragon voice recognition software and may include unintentional dictation errors.  Nanda Quinton, MD, [facility]    Jillana Selph, [facility], MD 03/26/21 [phone]

## 2021-03-24 NOTE — ED Notes (Signed)
Pt transported to radiology.

## 2021-03-24 NOTE — H&P (Addendum)
TR[facility] H&P   Patient Demographics:    Jenna Long, is a 87 y.o. female  MRN: [id number]   DOB - 28-Jan-1935  Admit Date - 03/24/2021  Outpatient Primary MD for the patient is Sharilyn Sites, MD  Referring MD/NP/PA: D Long  Patient coming from: home  Chief Complaint  Patient presents with   Hallucinations      HPI:    Jenna Long  is a 87 y.o. female, with past medical history of temporal arteritis, on prednisone taper, legally blind, diabetes, on insulin, history of prior CVA last October, she remains on Plavix, patient lives with her daughter, patient was noted to have altered mental status, and confusion started 56 PM yesterday, since that time she remains confused, as well she did have some visual hallucinations, which she saw bugs flying around her in the room(patient actually told ED physician it was actually linked to blowing around her in the bedroom), as well daughter report patient is usually ambulatory with a walker, but she was unable to stand up for her daughter today which is not her baseline, so she called EMS due to concern for stroke.  Daughter reports patient is compliant with her Plavix and antihypertensive medications, - in ED upon initial assessment by ED physician, she was noted to have left-sided weakness, appears to be significantly improved upon my evaluation, CT head with no acute findings, no significant lab abnormalities, blood pressure was uncontrolled 200/79, as well patient noted to have urinary retention required 1 time in and out, [facility] hospitalist consulted to admit for CVA/TIA or work-up.    Review of systems:    In addition to the HPI above,    A full 10 point Review of Systems was done, except as stated above, all other Review of Systems were negative.   With Past History of the following :    Past Medical History:  Diagnosis Date   Body mass  index (BMI) 21.0-21.9, adult    Congestive heart failure (CHF) (HCC)    Diabetes mellitus type II    DM (diabetes mellitus) ([place])    DNR (do not resuscitate) 11/08/2020   Hypercholesteremia    Hyperlipidemia    Hypertension    Osteoarthritis    Overweight    Polymyalgia rheumatica ([place])    Sequela of cerebrovascular accident    Temporal arteritis ([place])       Past Surgical History:  Procedure Laterality Date   ABDOMINAL HYSTERECTOMY  1991   [facility], [place]   left, [place]   ARTERY BIOPSY Right 11/09/2020   Procedure: BIOPSY TEMPORAL ARTERY;  Surgeon: Cherre Robins, MD;  Location: [facility] OR;  Service: Vascular;  Laterality: Right;   CATARACT EXTRACTION W/ INTRAOCULAR LENS IMPLANT  06/10/06    Right, [facility], [place]   CATARACT EXTRACTION W/PHACO  09/18/2010   Procedure: CATARACT EXTRACTION PHACO AND INTRAOCULAR LENS  PLACEMENT (IOC);  Surgeon: Williams Che;  Location: [facility] ORS;  Service: Ophthalmology;  Laterality: Left;   COLONOSCOPY W/ POLYPECTOMY  06/20/10   [facility], [place]   HIP ARTHROPLASTY Left 02/18/2020   Procedure: ARTHROPLASTY BIPOLAR HIP (HEMIARTHROPLASTY);  Surgeon: Mordecai Rasmussen, MD;  Location: [facility] ORS;  Service: Orthopedics;  Laterality: Left;      Social History:     Social History   Tobacco Use   Smoking status: Never   Smokeless tobacco: Never  Substance Use Topics   Alcohol use: No        Family History :     Family History  Problem Relation Age of Onset   Stroke Mother    Heart disease Father    Cancer Sister    Anesthesia problems Neg Hx      Home Medications:   Prior to Admission medications   Medication Sig Start Date End Date Taking? Authorizing Provider  acetaminophen (TYLENOL) 325 MG tablet Take 325-650 mg by mouth every 6 (six) hours as needed for moderate pain or headache.   Yes [provider]  amLODipine (NORVASC) 10 MG tablet Take 1 tablet (10 mg total) by mouth daily. 11/25/20  Yes Angiulli, Lavon Paganini, PA-C   atorvastatin (LIPITOR) 40 MG tablet Take 1 tablet (40 mg total) by mouth daily. 11/24/20  Yes Angiulli, Lavon Paganini, PA-C  clopidogrel (PLAVIX) 75 MG tablet To be restarted in 1 month when course of Aspirin and Brillinta has completed 01/16/21  Yes Domenic Polite, MD  dorzolamide (TRUSOPT) 2 % ophthalmic solution Place 1 drop into both eyes 2 (two) times daily. 11/24/20  Yes Angiulli, Lavon Paganini, PA-C  escitalopram (LEXAPRO) 5 MG tablet Take 5 mg by mouth daily. 01/30/21  Yes [provider]  insulin glargine (LANTUS) 100 UNIT/ML Solostar Pen Inject 12 Units into the skin daily. Patient taking differently: Inject 8 Units into the skin daily. 12/16/20  Yes Domenic Polite, MD  losartan (COZAAR) 25 MG tablet Take 25 mg by mouth daily. 02/23/21  Yes [provider]  metoprolol succinate (TOPROL-XL) 25 MG 24 hr tablet Take 1 tablet (25 mg total) by mouth daily. 12/16/20  Yes Domenic Polite, MD  Multiple Vitamin (MULTIVITAMIN) tablet Take 1 tablet by mouth daily.   Yes [provider]  predniSONE (DELTASONE) 1 MG tablet Take in combination with 5 mg tablets for total daily dose: 10 mg then decrease by 1 mg/day per month 04/07/21  Yes Rice, Resa Miner, MD  sitaGLIPtin (JANUVIA) 100 MG tablet Take 100 mg by mouth daily.   Yes [provider]  HYDROcodone-acetaminophen (NORCO/VICODIN) 5-325 MG tablet Take 1-2 tablets by mouth every 4 (four) hours as needed for moderate pain. Patient not taking: Reported on 12/29/2020 11/24/20   Cathlyn Parsons, PA-C  Insulin Pen Needle (PEN NEEDLES 3/16") 31G X 5 MM MISC 12 Units by Does not apply route daily. 12/16/20   Domenic Polite, MD  predniSONE (DELTASONE) 5 MG tablet Take in combination with 1 mg tablets for total daily dose: 10 mg then decrease by 1 mg/day per month 03/10/21   Rice, Resa Miner, MD     Allergies:    No Known Allergies   Physical Exam:   Vitals  Blood pressure (!) 185/89, pulse 61, temperature 97.9 F  (36.6 C), temperature source Oral, resp. rate 16, SpO2 99 %.   1. General elderly female, laying in bed in no apparent distress  2.  Patient is awake alert vented x3, she is with  intact judgment and insight, appropriate    3. She is legally blind, motor strength appears to be intact in all extremities.  4. Ears appear Normal, Conjunctivae clea. Moist Oral Mucosa.  5. Supple Neck, No JVD, No cervical lymphadenopathy appriciated, No Carotid Bruits.  6. Symmetrical Chest wall movement, Good air movement bilaterally, CTAB.  7. RRR, No Gallops, Rubs or Murmurs, No Parasternal Heave.  8. Positive Bowel Sounds, Abdomen Soft, No tenderness, No organomegaly appriciated,No rebound -guarding or rigidity.  9.  No Cyanosis, Normal Skin Turgor, No Skin Rash or Bruise.  10. Good muscle tone,  joints appear normal , no effusions, Normal ROM.  11. No Palpable Lymph Nodes in Neck or Axillae     Data Review:    CBC Recent Labs  Lab 03/24/21 1940  WBC 6.9  HGB 14.1  HCT 42.0  PLT 292  MCV 96.6  MCH 32.4  MCHC 33.6  RDW 13.9  LYMPHSABS 2.4  MONOABS 0.7  EOSABS 0.0  BASOSABS 0.0   ------------------------------------------------------------------------------------------------------------------  Chemistries  Recent Labs  Lab 03/24/21 1940  NA 139  K 3.6  CL 100  CO2 28  GLUCOSE 214*  BUN 16  CREATININE 0.72  CALCIUM 9.6  AST 16  ALT 25  ALKPHOS 73  BILITOT 0.5   ------------------------------------------------------------------------------------------------------------------ CrCl cannot be calculated (Unknown ideal weight.). ------------------------------------------------------------------------------------------------------------------ No results for input(s): TSH, T4TOTAL, T3FREE, THYROIDAB in the last 72 hours.  Invalid input(s): FREET3  Coagulation profile Recent Labs  Lab 03/24/21 1940  INR 0.9    ------------------------------------------------------------------------------------------------------------------- No results for input(s): DDIMER in the last 72 hours. -------------------------------------------------------------------------------------------------------------------  Cardiac Enzymes No results for input(s): CKMB, TROPONINI, MYOGLOBIN in the last 168 hours.  Invalid input(s): CK ------------------------------------------------------------------------------------------------------------------ No results found for: BNP   ---------------------------------------------------------------------------------------------------------------  Urinalysis    Component Value Date/Time   COLORURINE YELLOW 12/13/2020 1630   APPEARANCEUR CLOUDY (A) 12/13/2020 1630   LABSPEC 1.015 12/13/2020 1630   PHURINE 5.0 12/13/2020 1630   GLUCOSEU >=500 (A) 12/13/2020 1630   HGBUR NEGATIVE 12/13/2020 1630   BILIRUBINUR NEGATIVE 12/13/2020 1630   KETONESUR NEGATIVE 12/13/2020 1630   PROTEINUR NEGATIVE 12/13/2020 1630   NITRITE POSITIVE (A) 12/13/2020 1630   LEUKOCYTESUR MODERATE (A) 12/13/2020 1630    ----------------------------------------------------------------------------------------------------------------   Imaging Results:    DG Chest 2 View  Result Date: 03/24/2021 CLINICAL DATA:  Altered mental status EXAM: CHEST - 2 VIEW COMPARISON:  12/13/2020 FINDINGS: Cardiac and mediastinal contours are unchanged, when accounting for low lung volumes. No focal pulmonary opacity. No pleural effusion or pneumothorax. No acute osseous abnormality. IMPRESSION: No acute cardiopulmonary process. Electronically Signed   By: Merilyn Baba M.D.   On: 03/24/2021 20:12   CT HEAD WO CONTRAST  Result Date: 03/24/2021 CLINICAL DATA:  Mental status change, unknown cause. Additional history provided: Headache, elevated blood pressure. EXAM: CT HEAD WITHOUT CONTRAST TECHNIQUE: Contiguous axial images were  obtained from the base of the skull through the vertex without intravenous contrast. RADIATION DOSE REDUCTION: This exam was performed according to the departmental dose-optimization program which includes automated exposure control, adjustment of the mA and/or kV according to patient size and/or use of iterative reconstruction technique. COMPARISON:  Brain MRI 12/13/2020. CT angiogram head/neck 11/14/2020. FINDINGS: Brain: Mild generalized cerebral and cerebellar atrophy. Commensurate prominence of the ventricles and sulci. Mild to moderate patchy and ill-defined hypoattenuation within the cerebral white matter, nonspecific but compatible with chronic small vessel ischemic disease. There is no acute intracranial hemorrhage. No demarcated cortical infarct. No extra-axial fluid  collection. No evidence of an intracranial mass. No midline shift. Vascular: No hyperdense vessel.  Atherosclerotic calcifications. Skull: Normal. Negative for fracture or focal lesion. Sinuses/Orbits: No acute orbital finding. Mild mucosal thickening within the bilateral ethmoid sinuses. IMPRESSION: No evidence of acute intracranial abnormality. Mild to moderate chronic small vessel ischemic changes within the cerebral white matter, stable. Mild generalized cerebral and cerebellar atrophy. Mild mucosal thickening within the bilateral ethmoid sinuses. Electronically Signed   By: Kellie Simmering D.O.   On: 03/24/2021 20:23     Vent. rate 65 BPM PR interval 171 ms QRS duration 100 ms QT/QTcB 444/448 ms P-R-T axes 16 -13 44 Sinus rhythm Atrial premature complex Inferior infarct, old Anterior infarct, old Confirmed by Nanda Quinton  Assessment & Plan:    Principal Problem:   AMS (altered mental status) Active Problems:   Diabetes mellitus type 2 in nonobese [facility])   Essential hypertension   Temporal arteritis (HCC)   Type 2 diabetes mellitus with hyperglycemia, with long-term current use of insulin (HCC)   Mixed hyperlipidemia    Cerebral thrombosis with cerebral infarction   Chronic diastolic CHF (congestive heart failure) ([place])   DNR (do not resuscitate)   Vision loss of right eye   Dyslipidemia   AMS with left side weakness>> concern for acute CVA/TIA -Patient presents from home with altered mental status/hallucination over last 24 hours, and generalized weakness unable to stand up, she was noted to have left-sided weakness by ED physician -She is admitted under CVA/TIA protocol, recently had work-up including CTA head and neck, 2D echo, so I will hold on repeating that work-up, will check lipid panel, A1c, will give full dose aspirin, I will continue her on her home dose Plavix. -We will obtain MRI of brain to rule out acute CVA -We will consult PT/OT/SLP -Allow for permissive hypertension  Recent diagnosis of right temporal arteritis -Patient is currently legally blind, she is on prednisone taper, she is currently on 10 mg oral daily.  Type 2 diabetes mellitus -Resume on home dose Lantus, will add on insulin sliding scale, -Check A1c -Hold oral medications.  Chronic diastolic CHF -She appears to be euvolemic, not on diuretics at baseline  Hypertension -Blood pressure significantly elevated, will allow for permissive hypertension, will hold amlodipine, losartan and metoprolol -We will keep on as needed hydralazine for systolic blood pressure> 056  Hyperlipidemia -Check lipid panel, continue with home dose statin   Confirmed by daughter that patient is DNR Patient had 350 post void residual bladder scan, required 1 time in and out in ED.  DVT Prophylaxis Heparin   AM Labs Ordered, also please review Full Orders  Family Communication: Admission, patients condition and plan of care including tests being ordered have been discussed with the patient and daughter by phone who indicate understanding and agree with the plan and Code Status.  Code Status DNR  Likely DC to  home  Condition GUARDED     Consults called: D/W neuro by phone    Admission status: observation    Time spent in minutes : 65 minutes   Phillips Climes M.D on 03/24/2021 at 9:56 PM   [facility] - Office  [phone]

## 2021-03-24 NOTE — ED Notes (Signed)
Hospitalist at bedside 

## 2021-03-24 NOTE — ED Triage Notes (Addendum)
Pt arrived via EMS due to family noticed pt talking to people that are not there and not herself since 2300 yesterday, EMS reports that pt was calling for someone.  Pt's BP elevated as well.  Pt denied pain at first then stated she had a mild HA.  Pt not able to state where she is. Reported that pt lives with her daughter and pt is blind.

## 2021-03-24 NOTE — Plan of Care (Signed)
Brief Neuro Update:  71F with prior strokes and hx of temporal arteritis p/w confusion and left sided weakness and per discussion with Dr. Waldron Labs, her left sided weakness and confusion had resolved by the time he saw her. She was unable to stand up at home and usually is able to walk with a walker.  Of note, she was evaluated by Neurology in Oct 2022 for confusion and vision deficit and MRI at that time with left frontal punctate infarcts. Etiology felt to be embolic strokes of undetermined source. She was supposed to get cardiac event monitor but that was never done.  Given her focal deficit with resolution, TIA is on the differential. She will need to be transferred to [facility] as [facility] does not have the ability to do MRIs over the weekend.  Please page Neurohospitalist team when the patient is at [facility].  Plan discussed with Dr. Waldron Labs over phone.  [place] Pager Number [phone]

## 2021-03-25 ENCOUNTER — Observation Stay (HOSPITAL_COMMUNITY): Payer: PPO

## 2021-03-25 ENCOUNTER — Encounter (HOSPITAL_COMMUNITY): Payer: Self-pay | Admitting: Internal Medicine

## 2021-03-25 DIAGNOSIS — Z8673 Personal history of transient ischemic attack (TIA), and cerebral infarction without residual deficits: Secondary | ICD-10-CM | POA: Diagnosis not present

## 2021-03-25 DIAGNOSIS — I110 Hypertensive heart disease with heart failure: Secondary | ICD-10-CM | POA: Diagnosis not present

## 2021-03-25 DIAGNOSIS — E782 Mixed hyperlipidemia: Secondary | ICD-10-CM | POA: Diagnosis not present

## 2021-03-25 DIAGNOSIS — R531 Weakness: Secondary | ICD-10-CM | POA: Diagnosis not present

## 2021-03-25 DIAGNOSIS — Z20822 Contact with and (suspected) exposure to covid-19: Secondary | ICD-10-CM | POA: Diagnosis not present

## 2021-03-25 DIAGNOSIS — I6330 Cerebral infarction due to thrombosis of unspecified cerebral artery: Secondary | ICD-10-CM | POA: Diagnosis not present

## 2021-03-25 DIAGNOSIS — N179 Acute kidney failure, unspecified: Secondary | ICD-10-CM | POA: Diagnosis not present

## 2021-03-25 DIAGNOSIS — E119 Type 2 diabetes mellitus without complications: Secondary | ICD-10-CM | POA: Diagnosis not present

## 2021-03-25 DIAGNOSIS — I169 Hypertensive crisis, unspecified: Secondary | ICD-10-CM | POA: Diagnosis not present

## 2021-03-25 DIAGNOSIS — Z79899 Other long term (current) drug therapy: Secondary | ICD-10-CM | POA: Diagnosis not present

## 2021-03-25 DIAGNOSIS — Z7984 Long term (current) use of oral hypoglycemic drugs: Secondary | ICD-10-CM | POA: Diagnosis not present

## 2021-03-25 DIAGNOSIS — Z823 Family history of stroke: Secondary | ICD-10-CM | POA: Diagnosis not present

## 2021-03-25 DIAGNOSIS — D649 Anemia, unspecified: Secondary | ICD-10-CM | POA: Diagnosis not present

## 2021-03-25 DIAGNOSIS — R4182 Altered mental status, unspecified: Secondary | ICD-10-CM | POA: Diagnosis not present

## 2021-03-25 DIAGNOSIS — G319 Degenerative disease of nervous system, unspecified: Secondary | ICD-10-CM | POA: Diagnosis not present

## 2021-03-25 DIAGNOSIS — Z8249 Family history of ischemic heart disease and other diseases of the circulatory system: Secondary | ICD-10-CM | POA: Diagnosis not present

## 2021-03-25 DIAGNOSIS — Z794 Long term (current) use of insulin: Secondary | ICD-10-CM | POA: Diagnosis not present

## 2021-03-25 DIAGNOSIS — M315 Giant cell arteritis with polymyalgia rheumatica: Secondary | ICD-10-CM | POA: Diagnosis not present

## 2021-03-25 DIAGNOSIS — Z66 Do not resuscitate: Secondary | ICD-10-CM | POA: Diagnosis not present

## 2021-03-25 DIAGNOSIS — R441 Visual hallucinations: Secondary | ICD-10-CM | POA: Diagnosis present

## 2021-03-25 DIAGNOSIS — I5032 Chronic diastolic (congestive) heart failure: Secondary | ICD-10-CM | POA: Diagnosis not present

## 2021-03-25 DIAGNOSIS — E860 Dehydration: Secondary | ICD-10-CM | POA: Diagnosis not present

## 2021-03-25 DIAGNOSIS — F05 Delirium due to known physiological condition: Secondary | ICD-10-CM | POA: Diagnosis not present

## 2021-03-25 DIAGNOSIS — E876 Hypokalemia: Secondary | ICD-10-CM | POA: Diagnosis not present

## 2021-03-25 DIAGNOSIS — Z7902 Long term (current) use of antithrombotics/antiplatelets: Secondary | ICD-10-CM | POA: Diagnosis not present

## 2021-03-25 DIAGNOSIS — N390 Urinary tract infection, site not specified: Secondary | ICD-10-CM | POA: Diagnosis not present

## 2021-03-25 DIAGNOSIS — H548 Legal blindness, as defined in USA: Secondary | ICD-10-CM | POA: Diagnosis not present

## 2021-03-25 DIAGNOSIS — E1165 Type 2 diabetes mellitus with hyperglycemia: Secondary | ICD-10-CM | POA: Diagnosis not present

## 2021-03-25 LAB — LIPID PANEL
Cholesterol: 175 mg/dL (ref 0–200)
HDL: 48 mg/dL (ref >40)
LDL Cholesterol: 92 mg/dL (ref 0–99)
Total CHOL/HDL Ratio: 3.6 RATIO
Triglycerides: 174 mg/dL — ABNORMAL HIGH (ref <150)
VLDL: 35 mg/dL (ref 0–40)

## 2021-03-25 LAB — HEMOGLOBIN A1C
Hgb A1c MFr Bld: 6.6 % — ABNORMAL HIGH (ref 4.8–5.6)
Mean Plasma Glucose: 142.72 mg/dL

## 2021-03-25 IMAGING — MR MR HEAD W/O CM
12 of 13 series · 44 of 48 positions shown · non-contrast
Comparison: Head CT [DATE].  Brain MRI [DATE].

CLINICAL DATA: Provided history: Transient ischemic attack (TIA),
weakness, altered mental status. Additional history provided:
History of temporal arteritis.

EXAM:
MRI HEAD WITHOUT CONTRAST
TECHNIQUE: Multiplanar, multiecho pulse sequences of the brain and surrounding
structures were obtained without intravenous contrast.

[Series 5: DWI · axial · 3.0mm · 0.88mm/px · z∈[-51,+96]mm · 8 of 104 slices shown (1 of 4)]
[im 1/104]
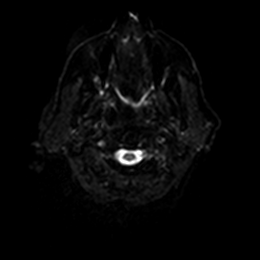
[im 15/104]
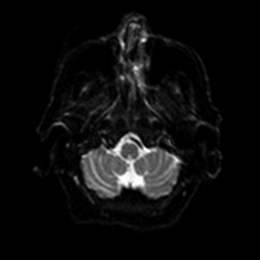
[im 30/104]
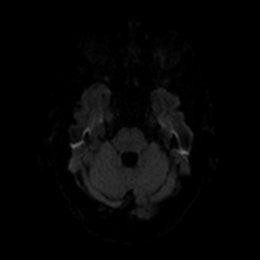
[im 45/104]
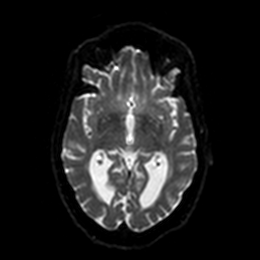
[im 59/104]
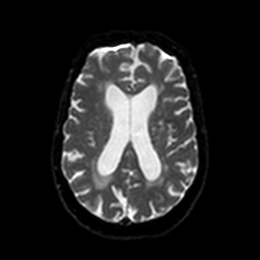
[im 74/104]
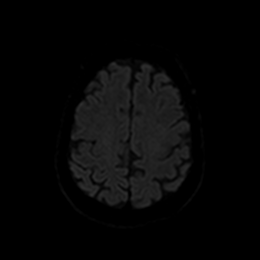
[im 89/104]
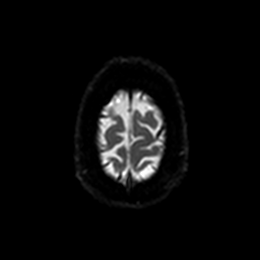
[im 104/104]
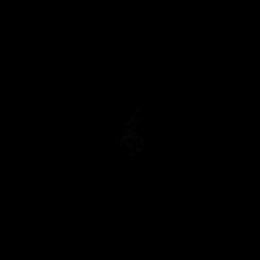

[Series 6: DWI · axial · 3.0mm · 0.88mm/px · z∈[-51,+96]mm · 4 of 52 slices shown (2 of 4)]
[im 1/52]
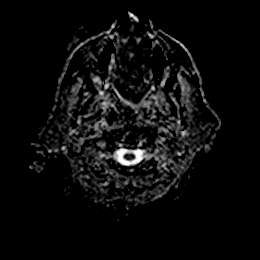
[im 18/52]
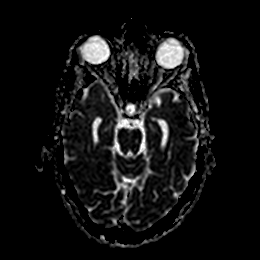
[im 35/52]
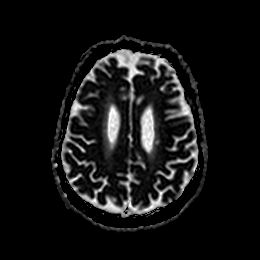
[im 52/52]
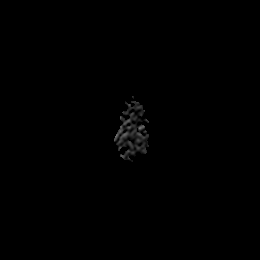

[Series 7: DWI · coronal · 4.0mm · 0.88mm/px · 6 of 74 slices shown (3 of 4)]
[im 1/74]
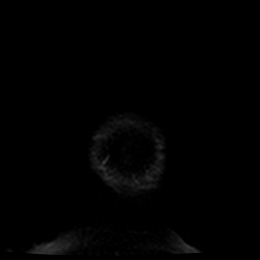
[im 15/74]
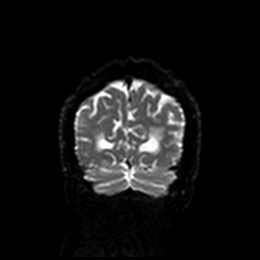
[im 30/74]
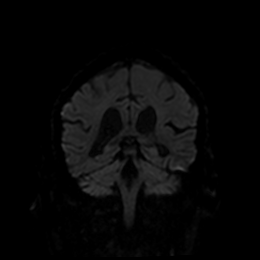
[im 44/74]
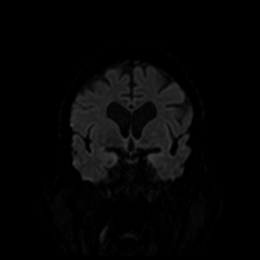
[im 59/74]
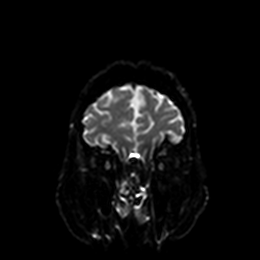
[im 74/74]
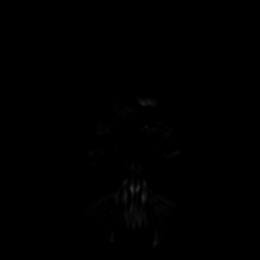

[Series 8: DWI · coronal · 4.0mm · 0.88mm/px · 3 of 37 slices shown (4 of 4)]
[im 1/37]
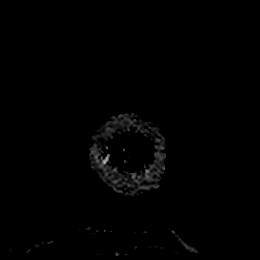
[im 19/37]
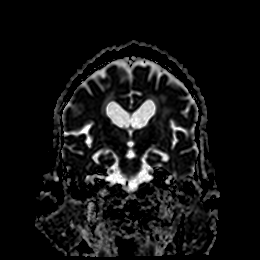
[im 37/37]
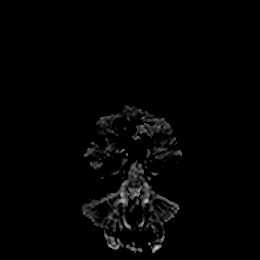

[Series 9: FLAIR · axial · 5.0mm · 0.45mm/px · z∈[-48,+97]mm · 2 of 26 slices shown]
[im 1/26]
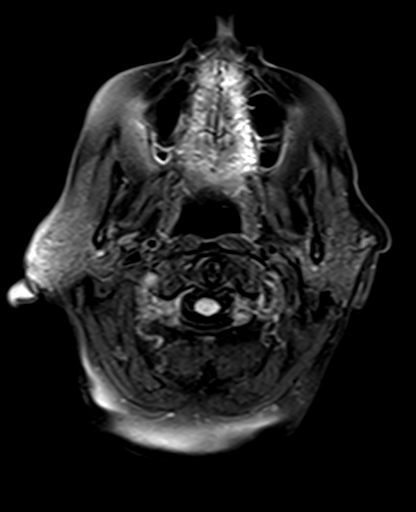
[im 26/26]
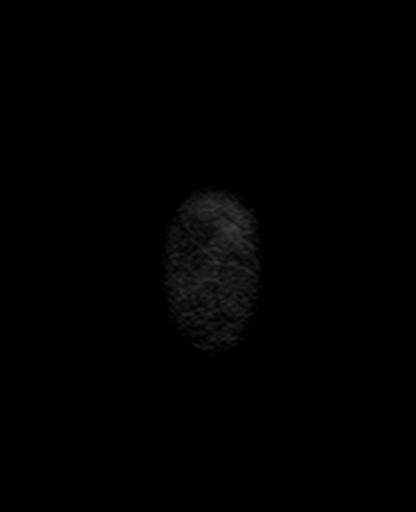

[Series 10: mag_images · axial · 3.0mm · 0.90mm/px · z∈[-49,+99]mm · 4 of 52 slices shown]
[im 1/52]
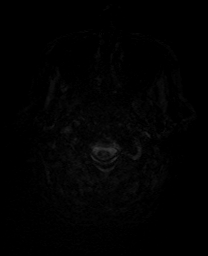
[im 18/52]
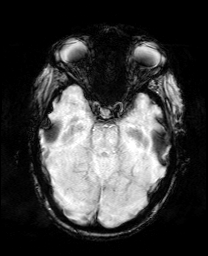
[im 35/52]
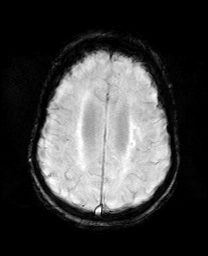
[im 52/52]
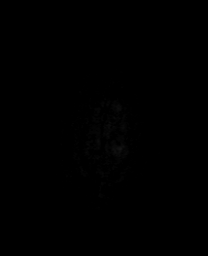

[Series 11: pha_images · axial · 3.0mm · 0.90mm/px · z∈[-49,+99]mm · 4 of 52 slices shown]
[im 1/52]
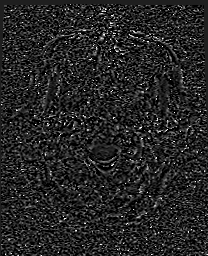
[im 18/52]
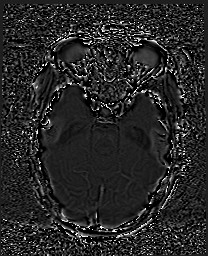
[im 35/52]
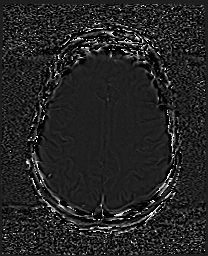
[im 52/52]
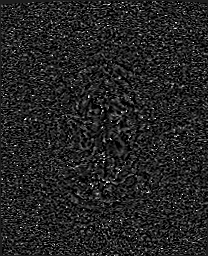

[Series 12: swi_images · axial · 3.0mm · 0.90mm/px · z∈[-49,+99]mm · 4 of 52 slices shown]
[im 1/52]
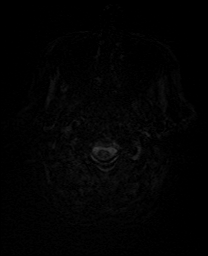
[im 18/52]
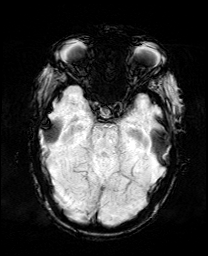
[im 35/52]
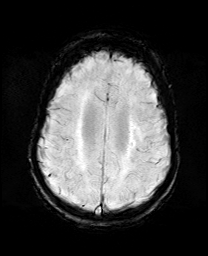
[im 52/52]
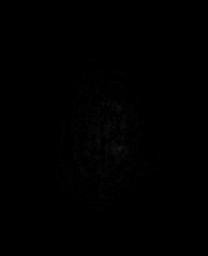

[Series 13: mip_images(sw) · axial · 24.0mm · 0.90mm/px · z∈[-39,+88]mm · 3 of 45 slices shown]
[im 1/45]
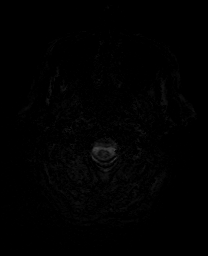
[im 23/45]
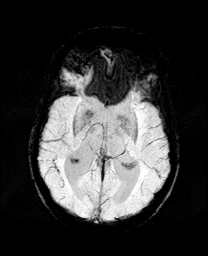
[im 45/45]
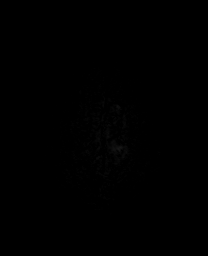

[Series 14: T1 · sagittal · 5.0mm · 0.75mm/px · 2 of 23 slices shown]
[im 1/23]
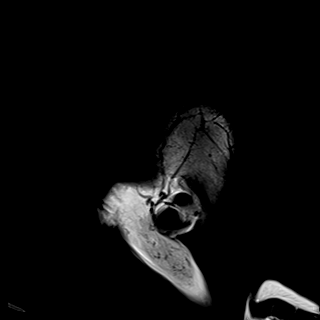
[im 23/23]
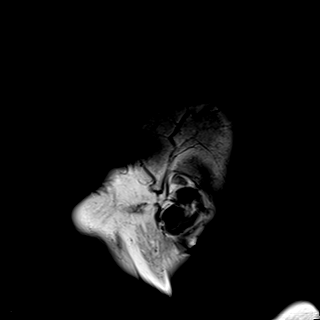

[Series 15: T2 · axial · 5.0mm · 0.72mm/px · z∈[-50,+95]mm · 2 of 26 slices shown (1 of 2)]
[im 1/26]
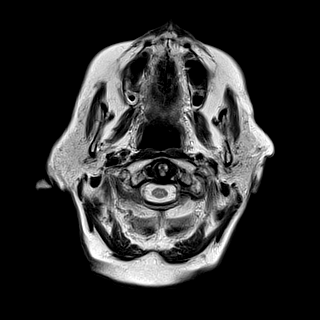
[im 26/26]
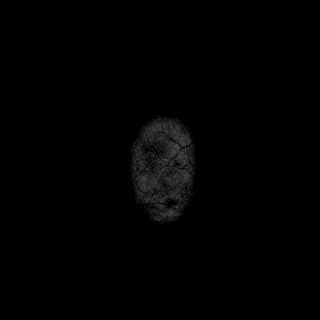

[Series 17: T2 · coronal · 5.0mm · 0.34mm/px · 2 of 31 slices shown (2 of 2)]
[im 1/31]
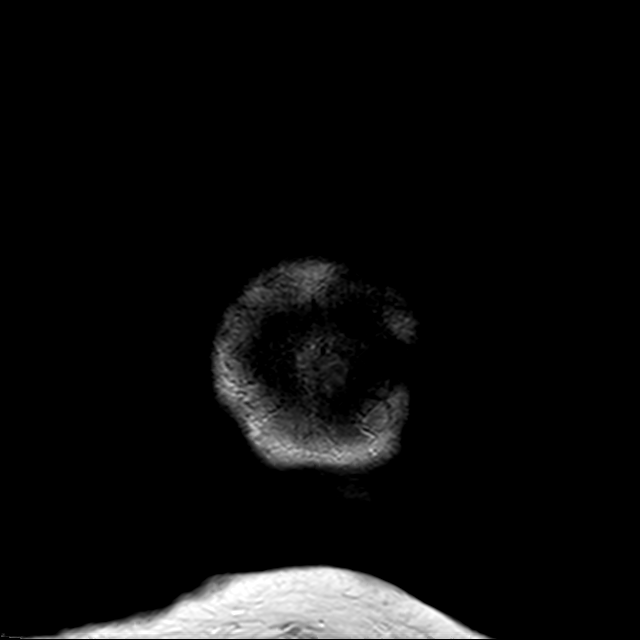
[im 31/31]
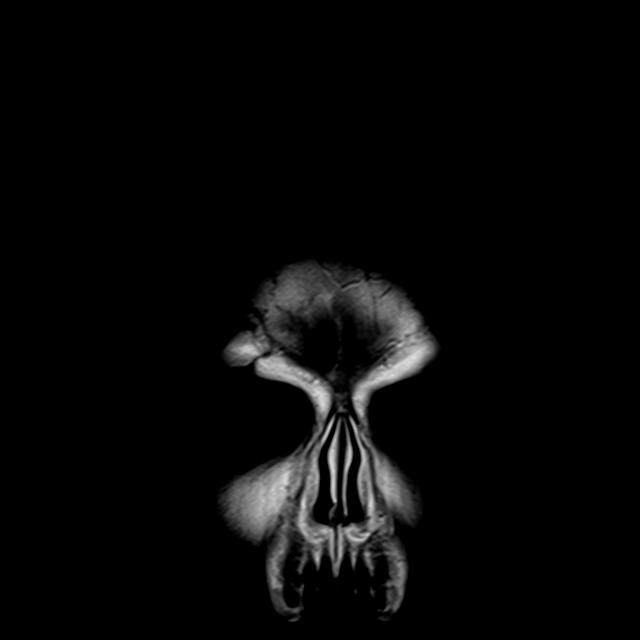

[44 of 48 positions shown; findings below may reference images not displayed]

FINDINGS: Brain:

Mild generalized cerebral and cerebellar atrophy.

Chronic lunar infarct within the left corona radiata

Background mild to moderate multifocal T2 FLAIR hyperintense signal
abnormality within the cerebral white matter and pons, nonspecific
but compatible with chronic small vessel ischemic disease.

Redemonstrated tiny chronic infarct within the inferior right
cerebellar hemisphere.

There is no acute infarct.

No evidence of an intracranial mass.

No chronic intracranial blood products.

No extra-axial fluid collection.

No midline shift.

Vascular: Maintained flow voids within the proximal large arterial
vessels.

Skull and upper cervical spine: No focal suspicious marrow lesion.

Sinuses/Orbits: Visualized orbits show no acute finding. Trace
scattered paranasal sinus mucosal thickening.
IMPRESSION: No evidence of acute intracranial abnormality.

Redemonstrated chronic lacunar infarct within the left corona
radiata.

Stable background mild-to-moderate chronic small-vessel ischemic
changes within the cerebral white matter and pons.

Redemonstrated tiny chronic infarct within the right cerebellar
hemisphere.

Mild generalized cerebral and cerebellar atrophy.

## 2021-03-25 MED ORDER — HYDRALAZINE HCL 20 MG/ML IJ SOLN: 10.0000 mg | INTRAMUSCULAR | Status: DC | PRN

## 2021-03-25 MED ORDER — AMLODIPINE BESYLATE 10 MG PO TABS
10.0000 mg | ORAL_TABLET | Freq: Every day | ORAL | Status: DC
  Administered 2021-03-25 – 2021-03-28 (×4): 10 mg via ORAL
  Filled 2021-03-25 (×4): qty 1

## 2021-03-25 MED ORDER — HALOPERIDOL LACTATE 5 MG/ML IJ SOLN
2.0000 mg | Freq: Once | INTRAMUSCULAR | Status: AC
  Administered 2021-03-25: 2 mg via INTRAVENOUS
  Filled 2021-03-25: qty 1

## 2021-03-25 MED ORDER — DIPHENHYDRAMINE HCL 50 MG/ML IJ SOLN
25.0000 mg | Freq: Once | INTRAMUSCULAR | Status: AC
  Administered 2021-03-25: 25 mg via INTRAVENOUS
  Filled 2021-03-25: qty 1

## 2021-03-25 MED ORDER — PHENOL 1.4 % MT LIQD: 1.0000 | OROMUCOSAL | Status: DC | PRN

## 2021-03-25 MED ORDER — LORAZEPAM 2 MG/ML IJ SOLN
2.0000 mg | Freq: Once | INTRAMUSCULAR | Status: AC
  Administered 2021-03-25: 2 mg via INTRAVENOUS
  Filled 2021-03-25: qty 1

## 2021-03-25 MED ORDER — LOSARTAN POTASSIUM 25 MG PO TABS
25.0000 mg | ORAL_TABLET | Freq: Every day | ORAL | Status: DC
  Administered 2021-03-25: 25 mg via ORAL
  Filled 2021-03-25: qty 1

## 2021-03-25 NOTE — Assessment & Plan Note (Signed)
-  Patient presents from home with altered mental status/hallucination over last 24 hours, and generalized weakness unable to stand up, she was noted to have left-sided weakness by ED physician -She is admitted under CVA/TIA protocol, recently had work-up including CTA head and neck, 2D echo, so I will hold on repeating that work-up, will check lipid panel, A1c, will give full dose aspirin, I will continue her on her home dose Plavix. -We will obtain MRI of brain to rule out acute CVA -We will consult PT/OT/SLP -Allow for permissive hypertension

## 2021-03-25 NOTE — Assessment & Plan Note (Signed)
-  Resume on home dose Lantus, will add on insulin sliding scale, -Check A1c -Hold oral medications.

## 2021-03-25 NOTE — Hospital Course (Signed)
87 y.o. female, with past medical history of temporal arteritis, on prednisone taper, legally blind, diabetes, on insulin, history of prior CVA last October, she remains on Plavix, patient lives with her daughter, patient was noted to have altered mental status, and confusion started 63 PM yesterday, since that time she remains confused, as well she did have some visual hallucinations, which she saw bugs flying around her in the room(patient actually told ED physician it was actually linked to blowing around her in the bedroom), as well daughter report patient is usually ambulatory with a walker, but she was unable to stand up for her daughter today which is not her baseline, so she called EMS due to concern for stroke.  Daughter reports patient is compliant with her Plavix and antihypertensive medications, - in ED upon initial assessment by ED physician, she was noted to have left-sided weakness, appears to be significantly improved upon my evaluation, CT head with no acute findings, no significant lab abnormalities, blood pressure was uncontrolled 200/79, as well patient noted to have urinary retention required 1 time in and out, [facility] hospitalist consulted to admit for CVA/TIA or work-up.

## 2021-03-25 NOTE — Consult Note (Signed)
NEUROLOGY CONSULTATION NOTE   Date of service: March 25, 2021 Patient Name: Jenna Long MRN:  [id number] DOB:  1934/07/25 Reason for consult: "confusion and left sided weakness, resolved" Requesting Provider: Alma Friendly, MD _ _ _   _ __   _ __ _ _  __ __   _ __   __ _  History of Present Illness  Jenna Long is a 87 y.o. 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 She was noted to have L sided weakness in the ED and this had resolved. She had CTH which was negative. She was transferred to [facility] for concern for potential stroke/TIA and due to inavailability of MRI at [facility] over weekend.  MRI Brain without contrast with no acute stroke, no acute abnormalities. rEEG with no seizures, no epileptiform discharges.  She is being treated for a UTI and has mild AKI.  On my evaluation, patient is somnolent but does not have any focal deficit. She got trazodone and wants to sleep.  I spoke with patient's daughter over the phone and per daughter, she was much clear today when she spoke to her over the phone. Daughter denies patient being weak on the left, felt more like she was weak all over. She got worried about stroke given her history of strokes.   ROS   Unable to obtain detailed ROS due to somnolence  Past History   Past Medical History:  Diagnosis Date   Body mass index (BMI) 21.0-21.9, adult    Congestive heart failure (CHF) (HCC)    Diabetes mellitus type II    DM (diabetes mellitus) ([place])    DNR (do not resuscitate) 11/08/2020   Hypercholesteremia    Hyperlipidemia    Hypertension    Osteoarthritis    Overweight    Polymyalgia rheumatica ([place])    Sequela of cerebrovascular accident    Temporal arteritis ([place])    Past Surgical  History:  Procedure Laterality Date   ABDOMINAL HYSTERECTOMY  1991   [facility], [place]   left, [place]   ARTERY BIOPSY Right 11/09/2020   Procedure: BIOPSY TEMPORAL ARTERY;  Surgeon: Cherre Robins, MD;  Location: [place];  Service: Vascular;  Laterality: Right;   CATARACT EXTRACTION W/ INTRAOCULAR LENS IMPLANT  06/10/06    Right, [facility], [place]   CATARACT EXTRACTION W/PHACO  09/18/2010   Procedure: CATARACT EXTRACTION PHACO AND INTRAOCULAR LENS PLACEMENT ([place]);  Surgeon: Williams Che;  Location: [facility];  Service: Ophthalmology;  Laterality: Left;   COLONOSCOPY W/ POLYPECTOMY  06/20/10   [facility], [place]   HIP ARTHROPLASTY Left 02/18/2020   Procedure: ARTHROPLASTY BIPOLAR HIP (HEMIARTHROPLASTY);  Surgeon: Mordecai Rasmussen, MD;  Location: [facility];  Service: Orthopedics;  Laterality: Left;   Family History  Problem Relation Age of Onset   Stroke Mother    Heart disease Father    Cancer Sister    Anesthesia problems Neg Hx    Social History   Socioeconomic History   Marital status: Widowed    Spouse name: Not on file   Number of children: Not on file   Years of education: Not on file   Highest education level: Not on file  Occupational History   Occupation: retired  Tobacco Use   Smoking status:  Never   Smokeless tobacco: Never  Vaping Use   Vaping Use: Never used  Substance and Sexual Activity   Alcohol use: No   Drug use: No   Sexual activity: Not on file  Other Topics Concern   Not on file  Social History Narrative   Not on file   Social Determinants of Health   Financial Resource Strain: Not on file  Food Insecurity: Not on file  Transportation Needs: Not on file  Physical Activity: Not on file  Stress: Not on file  Social Connections: Not on file   No Known Allergies  Medications   Medications Prior to Admission  Medication Sig Dispense Refill Last Dose   acetaminophen (TYLENOL) 325 MG tablet Take 325-650 mg by mouth every 6 (six) hours as needed  for moderate pain or headache.   unknown   amLODipine (NORVASC) 10 MG tablet Take 1 tablet (10 mg total) by mouth daily. 30 tablet 0 03/24/2021   atorvastatin (LIPITOR) 40 MG tablet Take 1 tablet (40 mg total) by mouth daily. 30 tablet 0 03/24/2021   clopidogrel (PLAVIX) 75 MG tablet To be restarted in 1 month when course of Aspirin and Brillinta has completed 5 tablet 0 03/24/2021 at 0700   dorzolamide (TRUSOPT) 2 % ophthalmic solution Place 1 drop into both eyes 2 (two) times daily. 10 mL 12 03/24/2021   escitalopram (LEXAPRO) 5 MG tablet Take 5 mg by mouth daily.   03/23/2021   insulin glargine (LANTUS) 100 UNIT/ML Solostar Pen Inject 12 Units into the skin daily. (Patient taking differently: Inject 8 Units into the skin daily.) 10 mL 0 03/23/2021   losartan (COZAAR) 25 MG tablet Take 25 mg by mouth daily.   03/24/2021   metoprolol succinate (TOPROL-XL) 25 MG 24 hr tablet Take 1 tablet (25 mg total) by mouth daily. 30 tablet 1 03/24/2021 at 0700   Multiple Vitamin (MULTIVITAMIN) tablet Take 1 tablet by mouth daily.   03/24/2021   [START ON 04/07/2021] predniSONE (DELTASONE) 1 MG tablet Take in combination with 5 mg tablets for total daily dose: 10 mg then decrease by 1 mg/day per month 120 tablet 1 03/24/2021   sitaGLIPtin (JANUVIA) 100 MG tablet Take 100 mg by mouth daily.   03/24/2021   HYDROcodone-acetaminophen (NORCO/VICODIN) 5-325 MG tablet Take 1-2 tablets by mouth every 4 (four) hours as needed for moderate pain. (Patient not taking: Reported on 12/29/2020) 30 tablet 0 Not Taking   Insulin Pen Needle (PEN NEEDLES 3/16") 31G X 5 MM MISC 12 Units by Does not apply route daily. 30 each 1    predniSONE (DELTASONE) 5 MG tablet Take in combination with 1 mg tablets for total daily dose: 10 mg then decrease by 1 mg/day per month 60 tablet 1      Vitals   Vitals:   03/25/21 1122 03/25/21 1638 03/25/21 2027 03/25/21 2323  BP: (!) 161/90 (!) 158/71 (!) 149/74 (!) 148/87  Pulse: 60 74 82 94  Resp: 16 16 14 18   Temp:  97.7 F (36.5 C) 98.5 F (36.9 C) 97.8 F (36.6 C) 98.5 F (36.9 C)  TempSrc: Oral Oral Oral Oral  SpO2: 99% 97% 98% 99%     There is no height or weight on file to calculate BMI.  Physical Exam   General: Laying comfortably in bed; in no acute distress.  HENT: Normal oropharynx and mucosa. Normal external appearance of ears and nose.  Neck: Supple, no pain or tenderness  CV: No JVD. No peripheral  edema.  Pulmonary: Symmetric Chest rise. Normal respiratory effort.  Abdomen: Soft to touch, non-tender.  Ext: No cyanosis, edema, or deformity  Skin: No rash. Normal palpation of skin.   Musculoskeletal: Normal digits and nails by inspection. No clubbing.   Neurologic Examination  Mental status/Cognition: Somnolent, oriented to self. Does no answer any more orientation questions. Speech/language:  Non fluent probably due to somnolence, comprehension intact to simple commands. Cranial nerves:   CN II Both pupils are 59mm and round. Right pupil with no response to light, left with sluggish response to light. On chart review, she is legally blind.   CN III,IV,VI EOM intact to dolls eyes, no gaze preference or deviation, no nystagmus   CN V normal sensation in V1, V2, and V3 segments bilaterally   CN VII Symmetric facial grimace.   CN VIII Turns head towards speech   CN IX & X Unable to assess 2/2 somnolence.   CN XI Unable to assess. Head is midline thou   CN XII midline tongue but does not protrude on command.   Motor:  Muscle bulk: poor, tone normal  Moves all extremities spontaneously. Follows commands in BL upper extremities. Localizes in BL lower extremities.  Sensation:  Light touch Regards touch in all extermities and localizes to mild pinch in all extremities.   Pin prick    Temperature    Vibration   Proprioception    Coordination/Complex Motor:  Unable to do but when she moves her extremities, her movements appear smooth with no ataxia. - Gait: unsafe to assess  given somnolence.  Labs   CBC:  Recent Labs  Lab 03/24/21 1940  WBC 6.9  NEUTROABS 3.7  HGB 14.1  HCT 42.0  MCV 96.6  PLT 086    Basic Metabolic Panel:  Lab Results  Component Value Date   NA 139 03/24/2021   K 3.6 03/24/2021   CO2 28 03/24/2021   GLUCOSE 214 (H) 03/24/2021   BUN 16 03/24/2021   CREATININE 0.72 03/24/2021   CALCIUM 9.6 03/24/2021   GFRNONAA >60 03/24/2021   GFRAA >60 09/12/2010   Lipid Panel:  Lab Results  Component Value Date   LDLCALC 92 03/25/2021   HgbA1c:  Lab Results  Component Value Date   HGBA1C 6.6 (H) 03/25/2021   Urine Drug Screen:     Component Value Date/Time   LABOPIA NONE DETECTED 03/24/2021 1914   COCAINSCRNUR NONE DETECTED 03/24/2021 1914   LABBENZ NONE DETECTED 03/24/2021 1914   AMPHETMU NONE DETECTED 03/24/2021 1914   THCU NONE DETECTED 03/24/2021 1914   LABBARB NONE DETECTED 03/24/2021 1914    Alcohol Level     Component Value Date/Time   ETH <10 03/24/2021 1940    CT Head without contrast(Personally reviewed): CTH was negative for a large hypodensity concerning for a large territory infarct or hyperdensity concerning for an ICH  MRI Brain(Personally reviewed): No Acute intracranial abnormalities  rEEG:  No epileptiform discharges  Impression   LAKODA RASKE is a 87 y.o. female who presents with confusion and visual hallucinations. There was concern for left sided weakness but on my discussion with daughter, she was weak all over and could not stand up.  I suspect that this was probably recrudescence of prior infarcts. She is somnolent on my evaluation but non focal. She was given trazodone tongith which does limit my evaluation.  Her MRI Brain is negative for an acute stroke and routine EEG is negative for seizures. This is reassuring. I do not think  that she needs any further neuro workup inpatient.  Recommendations  - We will signoff. Please feel free to contact us with any questions or  concerns. ______________________________________________________________________   Thank you for the opportunity to take part in the care of this patient. If you have any further questions, please contact the neurology consultation attending.  Signed,  [place] Pager Number [phone] _ _ _   _ __   _ __ _ _  __ __   _ __   __ _

## 2021-03-25 NOTE — Assessment & Plan Note (Signed)
-  She appears to be euvolemic, not on diuretics at baseline

## 2021-03-25 NOTE — Plan of Care (Signed)

## 2021-03-25 NOTE — Assessment & Plan Note (Signed)
MRI not available at [facility] Discussed with neurology at [facility] Admitted to [facility] for MRI and neuro consult Permissive hypertension  Telemetry monitoring Further recs pending MRI findings

## 2021-03-25 NOTE — Progress Notes (Signed)
Patient is much more alert and oriented x 3, confused to place but easily reoriented and pleasant. RN offered food and water but pt stated she was not hungry or thirsty. Refused to do a bedside [place] with 3 oz. of water. I will pass along to nightshift to reassess.

## 2021-03-25 NOTE — Assessment & Plan Note (Signed)
Confirmed by daughter that patient is DNR Patient had 350 post void residual bladder scan, required 1 time in and out in ED.

## 2021-03-25 NOTE — Progress Notes (Signed)
OT Cancellation Note  Patient Details Name: Jenna Long MRN: [id number] DOB: 1934/09/23   Cancelled Treatment:    Reason Eval/Treat Not Completed: Patient at procedure or test/ unavailable Off unit for MRI  Layla Maw 03/25/2021, 12:49 PM

## 2021-03-25 NOTE — Assessment & Plan Note (Signed)
-  Patient is currently legally blind, she is on prednisone taper, she is currently on 10 mg oral daily.

## 2021-03-25 NOTE — Progress Notes (Signed)
PROGRESS NOTE   Jenna Long  SEG:[id number] DOB: 12-May-1934 DOA: 03/24/2021 PCP: Sharilyn Sites, MD   Chief Complaint  Patient presents with   Hallucinations   Level of care: Telemetry Medical  Brief Admission History:  87 y.o. female, with past medical history of temporal arteritis, on prednisone taper, legally blind, diabetes, on insulin, history of prior CVA last October, she remains on Plavix, patient lives with her daughter, patient was noted to have altered mental status, and confusion started 79 PM yesterday, since that time she remains confused, as well she did have some visual hallucinations, which she saw bugs flying around her in the room(patient actually told ED physician it was actually linked to blowing around her in the bedroom), as well daughter report patient is usually ambulatory with a walker, but she was unable to stand up for her daughter today which is not her baseline, so she called EMS due to concern for stroke.  Daughter reports patient is compliant with her Plavix and antihypertensive medications, - in ED upon initial assessment by ED physician, she was noted to have left-sided weakness, appears to be significantly improved upon my evaluation, CT head with no acute findings, no significant lab abnormalities, blood pressure was uncontrolled 200/79, as well patient noted to have urinary retention required 1 time in and out, [facility] hospitalist consulted to admit for CVA/TIA or work-up. Assessment & Plan:   Assessment and Plan: * AMS (altered mental status)- (present on admission) -Patient presents from home with altered mental status/hallucination over last 24 hours, and generalized weakness unable to stand up, she was noted to have left-sided weakness by ED physician -She is admitted under CVA/TIA protocol, recently had work-up including CTA head and neck, 2D echo, so I will hold on repeating that work-up, will check lipid panel, A1c, will give full dose aspirin, I will  continue her on her home dose Plavix. -We will obtain MRI of brain to rule out acute CVA -We will consult PT/OT/SLP -Allow for permissive hypertension  Cerebral thrombosis with cerebral infarction- (present on admission) MRI not available at [facility] Discussed with neurology at [facility] Admitted to [facility] for MRI and neuro consult Permissive hypertension  Telemetry monitoring Further recs pending MRI findings   Temporal arteritis ([place])- (present on admission) -Patient is currently legally blind, she is on prednisone taper, she is currently on 10 mg oral daily.  Chronic diastolic CHF (congestive heart failure) ([place])- (present on admission) -She appears to be euvolemic, not on diuretics at baseline  Type 2 diabetes mellitus with hyperglycemia, with long-term current use of insulin (HCC) -Resume on home dose Lantus, will add on insulin sliding scale, -Check A1c -Hold oral medications.  DNR (do not resuscitate)- (present on admission) Confirmed by daughter that patient is DNR Patient had 350 post void residual bladder scan, required 1 time in and out in ED.  Essential hypertension- (present on admission) -Blood pressure significantly elevated, will allow for permissive hypertension, will hold amlodipine, losartan and metoprolol -We will keep on as needed hydralazine for systolic blood pressure> 737  DVT prophylaxis: sq heparin Code Status: DNR Family Communication: daughter  Disposition: transfer to [facility] for MRI Status is: Observation The patient remains OBS appropriate and will d/c before 2 midnights.  Consultants:  neuro  Procedures:  MRI pending  Antimicrobials:     Subjective: Pt had some sundowning behaviors overnight holding in ED.    Objective: Vitals:   03/25/21 0145 03/25/21 0400 03/25/21 0600 03/25/21 0635  BP: (!) 181/96   Marland Kitchen)  141/59  Pulse: 74 68 (!) 55 (!) 56  Resp: 12 14 12 14   Temp:      TempSrc:      SpO2: 99% 96% 97% 97%    Intake/Output Summary (Last 24  hours) at 03/25/2021 0738 Last data filed at 03/25/2021 0146 Gross per 24 hour  Intake --  Output 600 ml  Net -600 ml   There were no vitals filed for this visit.  Examination:  General exam: frail elderly female, lying in bed, legally blind, Appears calm and comfortable, very confused. Respiratory system: Clear to auscultation. Respiratory effort normal. Cardiovascular system: normal S1 & S2 heard. No JVD, murmurs, rubs, gallops or clicks. No pedal edema. Gastrointestinal system: Abdomen is nondistended, soft and nontender. No organomegaly or masses felt. Normal bowel sounds heard. Central nervous system: Alert and oriented. No focal neurological deficits. Extremities: Symmetric 5 x 5 power. Skin: No rashes, lesions or ulcers. Psychiatry: Judgement and insight appear poor.  Mood & affect flat.   Data Reviewed: I have personally reviewed following labs and imaging studies  CBC: Recent Labs  Lab 03/24/21 1940  WBC 6.9  NEUTROABS 3.7  HGB 14.1  HCT 42.0  MCV 96.6  PLT 505    Basic Metabolic Panel: Recent Labs  Lab 03/24/21 1940  NA 139  K 3.6  CL 100  CO2 28  GLUCOSE 214*  BUN 16  CREATININE 0.72  CALCIUM 9.6    CBG: No results for input(s): GLUCAP in the last 168 hours.  Recent Results (from the past 240 hour(s))  Resp Panel by RT-PCR (Flu A&B, Covid) Nasopharyngeal Swab     Status: None   Collection Time: 03/24/21  7:14 PM   Specimen: Nasopharyngeal Swab; Nasopharyngeal(NP) swabs in vial transport medium  Result Value Ref Range Status   SARS Coronavirus 2 by RT PCR NEGATIVE NEGATIVE Final    Comment: (NOTE) SARS-CoV-2 target nucleic acids are NOT DETECTED.  The SARS-CoV-2 RNA is generally detectable in upper respiratory specimens during the acute phase of infection. The lowest concentration of SARS-CoV-2 viral copies this assay can detect is 138 copies/mL. A negative result does not preclude SARS-Cov-2 infection and should not be used as the sole basis for  treatment or other patient management decisions. A negative result may occur with  improper specimen collection/handling, submission of specimen other than nasopharyngeal swab, presence of viral mutation(s) within the areas targeted by this assay, and inadequate number of viral copies(<138 copies/mL). A negative result must be combined with clinical observations, patient history, and epidemiological information. The expected result is Negative.  Fact Sheet for Patients:  EntrepreneurPulse.com.au  Fact Sheet for Healthcare Providers:  IncredibleEmployment.be  This test is no t yet approved or cleared by the [place] FDA and  has been authorized for detection and/or diagnosis of SARS-CoV-2 by FDA under an Emergency Use Authorization (EUA). This EUA will remain  in effect (meaning this test can be used) for the duration of the COVID-19 declaration under Section 564(b)(1) of the Act, 21 U.S.C.section 360bbb-3(b)(1), unless the authorization is terminated  or revoked sooner.       Influenza A by PCR NEGATIVE NEGATIVE Final   Influenza B by PCR NEGATIVE NEGATIVE Final    Comment: (NOTE) The Xpert Xpress SARS-CoV-2/FLU/RSV plus assay is intended as an aid in the diagnosis of influenza from Nasopharyngeal swab specimens and should not be used as a sole basis for treatment. Nasal washings and aspirates are unacceptable for Xpert Xpress SARS-CoV-2/FLU/RSV testing.  Fact Sheet  for Patients: EntrepreneurPulse.com.au  Fact Sheet for Healthcare Providers: IncredibleEmployment.be  This test is not yet approved or cleared by the [place] FDA and has been authorized for detection and/or diagnosis of SARS-CoV-2 by FDA under an Emergency Use Authorization (EUA). This EUA will remain in effect (meaning this test can be used) for the duration of the COVID-19 declaration under Section 564(b)(1) of the Act, 21  U.S.C. section 360bbb-3(b)(1), unless the authorization is terminated or revoked.  Performed at [facility], [address] [place], [place] [address]      Radiology Studies: DG Chest 2 View  Result Date: 03/24/2021 CLINICAL DATA:  Altered mental status EXAM: CHEST - 2 VIEW COMPARISON:  12/13/2020 FINDINGS: Cardiac and mediastinal contours are unchanged, when accounting for low lung volumes. No focal pulmonary opacity. No pleural effusion or pneumothorax. No acute osseous abnormality. IMPRESSION: No acute cardiopulmonary process. Electronically Signed   By: Merilyn Baba M.D.   On: 03/24/2021 20:12   CT HEAD WO CONTRAST  Result Date: 03/24/2021 CLINICAL DATA:  Mental status change, unknown cause. Additional history provided: Headache, elevated blood pressure. EXAM: CT HEAD WITHOUT CONTRAST TECHNIQUE: Contiguous axial images were obtained from the base of the skull through the vertex without intravenous contrast. RADIATION DOSE REDUCTION: This exam was performed according to the departmental dose-optimization program which includes automated exposure control, adjustment of the mA and/or kV according to patient size and/or use of iterative reconstruction technique. COMPARISON:  Brain MRI 12/13/2020. CT angiogram head/neck 11/14/2020. FINDINGS: Brain: Mild generalized cerebral and cerebellar atrophy. Commensurate prominence of the ventricles and sulci. Mild to moderate patchy and ill-defined hypoattenuation within the cerebral white matter, nonspecific but compatible with chronic small vessel ischemic disease. There is no acute intracranial hemorrhage. No demarcated cortical infarct. No extra-axial fluid collection. No evidence of an intracranial mass. No midline shift. Vascular: No hyperdense vessel.  Atherosclerotic calcifications. Skull: Normal. Negative for fracture or focal lesion. Sinuses/Orbits: No acute orbital finding. Mild mucosal thickening within the bilateral ethmoid sinuses. IMPRESSION: No  evidence of acute intracranial abnormality. Mild to moderate chronic small vessel ischemic changes within the cerebral white matter, stable. Mild generalized cerebral and cerebellar atrophy. Mild mucosal thickening within the bilateral ethmoid sinuses. Electronically Signed   By: Kellie Simmering D.O.   On: 03/24/2021 20:23    Scheduled Meds:   stroke: mapping our early stages of recovery book   Does not apply Once   atorvastatin  40 mg Oral Daily   clopidogrel  75 mg Oral Daily   dorzolamide  1 drop Both Eyes BID   heparin  5,000 Units Subcutaneous Q8H   insulin glargine-yfgn  8 Units Subcutaneous Daily   multivitamin with minerals  1 tablet Oral Daily   predniSONE  10 mg Oral Q breakfast   Continuous Infusions:  sodium chloride 50 mL/hr at 03/24/21 2355     LOS: 0 days   Time spent: 35 mins  Jayda White Wynetta Emery, MD How to contact the [facility] Attending or Consulting provider [place] or covering provider during after hours [place], for this patient?  Check the care team in [facility] and look for a) attending/consulting [facility] provider listed and b) the [facility] team listed Log into [email or web address] and use [place]'s universal password to access. If you do not have the password, please contact the hospital operator. Locate the [facility] provider you are looking for under [facility] and page to a number that you can be directly reached. If you still have difficulty reaching the  provider, please page the [facility] (Director on Call) for the Hospitalists listed on amion for assistance.  03/25/2021, 7:38 AM

## 2021-03-25 NOTE — Progress Notes (Addendum)
Morning PO meds held due to decreased level of alertness, will reassess this afternoon. MD made aware, daughter at bedside. Pt is awaiting MRI.

## 2021-03-25 NOTE — Progress Notes (Signed)
03/25/2021 9:49 AM  Just spoke with RN, will continue permissive hypertension for now until we have ruled out stroke with MRI test.    Murvin Natal, MD

## 2021-03-25 NOTE — Assessment & Plan Note (Signed)
-  Blood pressure significantly elevated, will allow for permissive hypertension, will hold amlodipine, losartan and metoprolol -We will keep on as needed hydralazine for systolic blood pressure> 790

## 2021-03-25 NOTE — Progress Notes (Signed)
PT Cancellation Note  Patient Details Name: Jenna Long MRN: [id number] DOB: Apr 30, 1934   Cancelled Treatment:    Reason Eval/Treat Not Completed: Patient at procedure or test/unavailable. Pt extremely lethargic after receiving multiple medication over night upon first attempt at PT eval, PT then returned and pt was off the floor at MRI. PT to return as able to complete PT eval.  Kittie Plater, PT, DPT Acute Rehabilitation Services Pager #: [phone] Office #: [phone]    [place]line Lopes 03/25/2021, 4:23 PM

## 2021-03-25 NOTE — ED Notes (Signed)
When attempting to assess NIH and give prednisone, patient unable to follow commands and is mumbling incoherent sentences. NIH cannot be completed at this time due to patient unable to follow any commands or actively participant in assessment. Patient cannot follow commands to spit from a straw or swallow and prednisone will be withheld. Dr. Wynetta Emery at bedside during this time.

## 2021-03-25 NOTE — Progress Notes (Signed)
Patient arrived to the unit, 3W, Rm 1 via PTAR. Difficult to complete assessment and NIH at this time due to medications, pt is responsive and oriented to self but remains drowsy. Will reassess once more alert.

## 2021-03-25 NOTE — ED Notes (Signed)
Pt observed sitting up in bed stripping off gown and re,moving ECG leads, BP Cuff and Pulse Ox. Pt then observed carrying on a conversation with people NOT present in the ED Room. Pt having current A/V hallucinations.

## 2021-03-26 ENCOUNTER — Inpatient Hospital Stay (HOSPITAL_COMMUNITY): Payer: PPO

## 2021-03-26 DIAGNOSIS — I10 Essential (primary) hypertension: Secondary | ICD-10-CM

## 2021-03-26 DIAGNOSIS — M315 Giant cell arteritis with polymyalgia rheumatica: Secondary | ICD-10-CM | POA: Diagnosis present

## 2021-03-26 DIAGNOSIS — G9341 Metabolic encephalopathy: Secondary | ICD-10-CM

## 2021-03-26 DIAGNOSIS — Z7984 Long term (current) use of oral hypoglycemic drugs: Secondary | ICD-10-CM | POA: Diagnosis not present

## 2021-03-26 DIAGNOSIS — E876 Hypokalemia: Secondary | ICD-10-CM | POA: Diagnosis present

## 2021-03-26 DIAGNOSIS — Z8673 Personal history of transient ischemic attack (TIA), and cerebral infarction without residual deficits: Secondary | ICD-10-CM | POA: Diagnosis not present

## 2021-03-26 DIAGNOSIS — E119 Type 2 diabetes mellitus without complications: Secondary | ICD-10-CM | POA: Diagnosis not present

## 2021-03-26 DIAGNOSIS — R441 Visual hallucinations: Secondary | ICD-10-CM

## 2021-03-26 DIAGNOSIS — I169 Hypertensive crisis, unspecified: Secondary | ICD-10-CM | POA: Diagnosis present

## 2021-03-26 DIAGNOSIS — Z79899 Other long term (current) drug therapy: Secondary | ICD-10-CM | POA: Diagnosis not present

## 2021-03-26 DIAGNOSIS — Z20822 Contact with and (suspected) exposure to covid-19: Secondary | ICD-10-CM | POA: Diagnosis present

## 2021-03-26 DIAGNOSIS — R4182 Altered mental status, unspecified: Secondary | ICD-10-CM | POA: Diagnosis present

## 2021-03-26 DIAGNOSIS — D649 Anemia, unspecified: Secondary | ICD-10-CM | POA: Diagnosis present

## 2021-03-26 DIAGNOSIS — E860 Dehydration: Secondary | ICD-10-CM | POA: Diagnosis present

## 2021-03-26 DIAGNOSIS — H548 Legal blindness, as defined in USA: Secondary | ICD-10-CM | POA: Diagnosis present

## 2021-03-26 DIAGNOSIS — F05 Delirium due to known physiological condition: Secondary | ICD-10-CM | POA: Diagnosis not present

## 2021-03-26 DIAGNOSIS — I6330 Cerebral infarction due to thrombosis of unspecified cerebral artery: Secondary | ICD-10-CM | POA: Diagnosis not present

## 2021-03-26 DIAGNOSIS — Z794 Long term (current) use of insulin: Secondary | ICD-10-CM | POA: Diagnosis not present

## 2021-03-26 DIAGNOSIS — N390 Urinary tract infection, site not specified: Secondary | ICD-10-CM | POA: Diagnosis present

## 2021-03-26 DIAGNOSIS — Z66 Do not resuscitate: Secondary | ICD-10-CM | POA: Diagnosis present

## 2021-03-26 DIAGNOSIS — N179 Acute kidney failure, unspecified: Secondary | ICD-10-CM | POA: Diagnosis present

## 2021-03-26 DIAGNOSIS — Z823 Family history of stroke: Secondary | ICD-10-CM | POA: Diagnosis not present

## 2021-03-26 DIAGNOSIS — I110 Hypertensive heart disease with heart failure: Secondary | ICD-10-CM | POA: Diagnosis present

## 2021-03-26 DIAGNOSIS — E1165 Type 2 diabetes mellitus with hyperglycemia: Secondary | ICD-10-CM | POA: Diagnosis present

## 2021-03-26 DIAGNOSIS — M316 Other giant cell arteritis: Secondary | ICD-10-CM

## 2021-03-26 DIAGNOSIS — E782 Mixed hyperlipidemia: Secondary | ICD-10-CM | POA: Diagnosis present

## 2021-03-26 DIAGNOSIS — Z8249 Family history of ischemic heart disease and other diseases of the circulatory system: Secondary | ICD-10-CM | POA: Diagnosis not present

## 2021-03-26 DIAGNOSIS — I5032 Chronic diastolic (congestive) heart failure: Secondary | ICD-10-CM | POA: Diagnosis present

## 2021-03-26 DIAGNOSIS — Z7902 Long term (current) use of antithrombotics/antiplatelets: Secondary | ICD-10-CM | POA: Diagnosis not present

## 2021-03-26 LAB — CBC WITH DIFFERENTIAL/PLATELET
Abs Immature Granulocytes: 0.05 10*3/uL (ref 0.00–0.07)
Basophils Absolute: 0 10*3/uL (ref 0.0–0.1)
Basophils Relative: 0 %
Eosinophils Absolute: 0 10*3/uL (ref 0.0–0.5)
Eosinophils Relative: 1 %
HCT: 41.1 % (ref 36.0–46.0)
Hemoglobin: 13.8 g/dL (ref 12.0–15.0)
Immature Granulocytes: 1 %
Lymphocytes Relative: 24 %
Lymphs Abs: 2 10*3/uL (ref 0.7–4.0)
MCH: 31.9 pg (ref 26.0–34.0)
MCHC: 33.6 g/dL (ref 30.0–36.0)
MCV: 95.1 fL (ref 80.0–100.0)
Monocytes Absolute: 0.8 10*3/uL (ref 0.1–1.0)
Monocytes Relative: 10 %
Neutro Abs: 5.2 10*3/uL (ref 1.7–7.7)
Neutrophils Relative %: 64 %
Platelets: 271 10*3/uL (ref 150–400)
RBC: 4.32 MIL/uL (ref 3.87–5.11)
RDW: 14.1 % (ref 11.5–15.5)
WBC: 8.1 10*3/uL (ref 4.0–10.5)
nRBC: 0 % (ref 0.0–0.2)

## 2021-03-26 LAB — GLUCOSE, CAPILLARY
Glucose-Capillary: 120 mg/dL — ABNORMAL HIGH (ref 70–99)
Glucose-Capillary: 172 mg/dL — ABNORMAL HIGH (ref 70–99)
Glucose-Capillary: 142 mg/dL — ABNORMAL HIGH (ref 70–99)
Glucose-Capillary: 170 mg/dL — ABNORMAL HIGH (ref 70–99)

## 2021-03-26 LAB — BASIC METABOLIC PANEL
Anion gap: 10 (ref 5–15)
BUN: 18 mg/dL (ref 8–23)
CO2: 28 mmol/L (ref 22–32)
Calcium: 9.4 mg/dL (ref 8.9–10.3)
Chloride: 103 mmol/L (ref 98–111)
Creatinine, Ser: 1.17 mg/dL — ABNORMAL HIGH (ref 0.44–1.00)
GFR, Estimated: 46 mL/min — ABNORMAL LOW (ref >60)
Glucose, Bld: 102 mg/dL — ABNORMAL HIGH (ref 70–99)
Potassium: 3.9 mmol/L (ref 3.5–5.1)
Sodium: 141 mmol/L (ref 135–145)

## 2021-03-26 LAB — MAGNESIUM: Magnesium: 1.7 mg/dL (ref 1.7–2.4)

## 2021-03-26 MED ORDER — SODIUM CHLORIDE 0.9 % IV SOLN
1.0000 g | INTRAVENOUS | Status: DC
  Administered 2021-03-26 – 2021-03-27 (×2): 1 g via INTRAVENOUS
  Filled 2021-03-26 (×2): qty 10

## 2021-03-26 MED ORDER — TRAZODONE HCL 50 MG PO TABS
50.0000 mg | ORAL_TABLET | Freq: Every day | ORAL | Status: DC
  Administered 2021-03-26: 50 mg via ORAL
  Filled 2021-03-26: qty 1

## 2021-03-26 MED ORDER — INSULIN ASPART 100 UNIT/ML IJ SOLN: 0.0000 [IU] | Freq: Every day | INTRAMUSCULAR | Status: DC

## 2021-03-26 MED ORDER — INSULIN ASPART 100 UNIT/ML IJ SOLN
0.0000 [IU] | Freq: Three times a day (TID) | INTRAMUSCULAR | Status: DC
  Administered 2021-03-26: 1 [IU] via SUBCUTANEOUS
  Administered 2021-03-26: 2 [IU] via SUBCUTANEOUS
  Administered 2021-03-27 (×2): 3 [IU] via SUBCUTANEOUS
  Administered 2021-03-28: 1 [IU] via SUBCUTANEOUS

## 2021-03-26 MED ORDER — ENOXAPARIN SODIUM 40 MG/0.4ML IJ SOSY
40.0000 mg | PREFILLED_SYRINGE | INTRAMUSCULAR | Status: DC
  Administered 2021-03-27 – 2021-03-28 (×2): 40 mg via SUBCUTANEOUS
  Filled 2021-03-26 (×2): qty 0.4

## 2021-03-26 NOTE — Progress Notes (Addendum)
SLP Cancellation Note  Patient Details Name: Jenna Long MRN: [id number] DOB: [birth year]   Cancelled treatment:       Reason Eval/Treat Not Completed: Fatigue/lethargy limiting ability to participate (SLP evaluation was attempted, but pt was unable to maintain alertness for long enough periods to participate. SLP will follow up on subsequent date.)  Tyronne Blann I. Hardin Negus, [place], [place] Office number [phone] Pager [phone]  Horton Marshall 03/26/2021, 2:31 PM

## 2021-03-26 NOTE — Evaluation (Signed)
Physical Therapy Evaluation Patient Details Name: Jenna Long MRN: [id number] DOB: [birth year] Today's Date: 03/26/2021  History of Present Illness  87 y.o. female was noted to have altered mental status, confusion, and hallucinations was brought to ED 03/24/21. Left sided weakness; CT head and MRI brain with no acute findings; BP 200/79; +urinary retention;  PMH-temporal arteritis, on prednisone taper, legally blind, diabetes, history of prior CVA last October  Clinical Impression   Pt admitted secondary to problem above with deficits below. PTA patient was living with daughter and 24/7 care by family members. She could walk with RW with minguard assist for guiding her due to legally blind. She did however require 4 family members to assist her up/down steps into home due to blindness, imbalance, and weakness. (All information per daughter).  Pt currently requires 2 person assisst for ambulation with RW due to posterior lean. Daughter informed and states if pt needs continued therapy, she would want to take pt home in a wheelchair and have HHPT--she does not want SNF. Anticipate patient will benefit from PT to address problems listed below.Will continue to follow acutely to maximize functional mobility independence and safety.          Recommendations for follow up therapy are one component of a multi-disciplinary discharge planning process, led by the attending physician.  Recommendations may be updated based on patient status, additional functional criteria and insurance authorization.  Follow Up Recommendations Home health PT (daughter refusing SNF)    Assistance Recommended at Discharge Frequent or constant Supervision/Assistance  Patient can return home with the following  Two people to help with walking and/or transfers;Help with stairs or ramp for entrance    Equipment Recommendations None recommended by PT  Recommendations for Other Services  OT consult    Functional Status  Assessment Patient has had a recent decline in their functional status and demonstrates the ability to make significant improvements in function in a reasonable and predictable amount of time.     Precautions / Restrictions Precautions Precautions: Fall      Mobility  Bed Mobility Overal bed mobility: Needs Assistance Bed Mobility: Supine to Sit     Supine to sit: Mod assist     General bed mobility comments: pt able to move legs over EOB with cues; needs mod assist to bring trunk up to sitting; min assist to scoot to get feet on the floor    Transfers Overall transfer level: Needs assistance Equipment used: Rolling walker (2 wheels) Transfers: Sit to/from Stand Sit to Stand: Mod assist           General transfer comment: posterior lean with incr assist to get balanced over BOS    Ambulation/Gait Ambulation/Gait assistance: Max assist Gait Distance (Feet): 10 Feet Assistive device: Rolling walker (2 wheels) Gait Pattern/deviations: Step-to pattern, Decreased stride length, Decreased dorsiflexion - left, Decreased weight shift to right, Shuffle, Leaning posteriorly, Narrow base of support Gait velocity: significantly decr Gait velocity interpretation: <1.31 ft/sec, indicative of household ambulator   General Gait Details: posterior lean increasing as progressed (initially min assist and progressed to max asssist as pt became more anxious and with pushing posteriorly)  Stairs            Wheelchair Mobility    Modified Rankin (Stroke Patients Only)       Balance Overall balance assessment: Needs assistance Sitting-balance support: No upper extremity supported, Feet supported Sitting balance-Leahy Scale: Fair Sitting balance - Comments: close supervision due to decr cognition; no physical  support   Standing balance support: Bilateral upper extremity supported, Reliant on assistive device for balance Standing balance-Leahy Scale: Poor Standing balance  comment: posterior lean                             Pertinent Vitals/Pain Pain Assessment Pain Assessment: No/denies pain    Home Living Family/patient expects to be discharged to:: Private residence Living Arrangements: Children Available Help at Discharge: Family;Available 24 hours/day (daughter, son in law, grandson (nearby)) Type of Home: House Home Access: Stairs to enter Entrance Stairs-Rails: Right;Left;Can reach both Technical brewer of Steps: 3   Home Layout: One level Home Equipment: Conservation officer, nature (2 wheels);Cane - single point;BSC/3in1;Shower seat;Wheelchair - manual;Grab bars - tub/shower;Toilet riser Additional Comments: called daughter to confirm information    Prior Function Prior Level of Function : Needs assist             Mobility Comments: uses RW with guidance due to low vision; needed 4 person assist up/down steps into home due to low vision and imbalance/weakness ADLs Comments: bathes at sink due to tub being too tall to step over; setup for bathing; assist with dressing due to low vision     Hand Dominance   Dominant Hand: Right    Extremity/Trunk Assessment   Upper Extremity Assessment Upper Extremity Assessment: Defer to OT evaluation    Lower Extremity Assessment Lower Extremity Assessment: Generalized weakness    Cervical / Trunk Assessment Cervical / Trunk Assessment: Normal  Communication   Communication: HOH  Cognition Arousal/Alertness: Awake/alert Behavior During Therapy: Anxious (during ambulation) Overall Cognitive Status: No family/caregiver present to determine baseline cognitive functioning                                 General Comments: oriented to self, DOB, [place], president        General Comments      Exercises     Assessment/Plan    PT Assessment Patient needs continued PT services  PT Problem List Decreased strength;Decreased balance;Decreased mobility;Decreased  cognition;Decreased knowledge of use of DME;Decreased safety awareness;Other (comment) (blindness)       PT Treatment Interventions DME instruction;Gait training;Stair training;Functional mobility training;Therapeutic activities;Therapeutic exercise;Balance training;Cognitive remediation;Patient/family education    PT Goals (Current goals can be found in the Care Plan section)  Acute Rehab PT Goals Patient Stated Goal: wants to go home today PT Goal Formulation: With patient Time For Goal Achievement: 04/09/21 Potential to Achieve Goals: Fair    Frequency Min 3X/week     Co-evaluation               AM-PAC PT "6 Clicks" Mobility  Outcome Measure Help needed turning from your back to your side while in a flat bed without using bedrails?: A Little Help needed moving from lying on your back to sitting on the side of a flat bed without using bedrails?: A Lot Help needed moving to and from a bed to a chair (including a wheelchair)?: A Lot Help needed standing up from a chair using your arms (e.g., wheelchair or bedside chair)?: A Lot Help needed to walk in hospital room?: Total Help needed climbing 3-5 steps with a railing? : Total 6 Click Score: 11    End of Session Equipment Utilized During Treatment: Gait belt Activity Tolerance: Patient tolerated treatment well Patient left: in chair;with call bell/phone within reach;with nursing/sitter in room;with  chair alarm set Nurse Communication: Mobility status PT Visit Diagnosis: Unsteadiness on feet (R26.81);Difficulty in walking, not elsewhere classified (R26.2);Muscle weakness (generalized) (M62.81)    Time: 9509-3267 PT Time Calculation (min) (ACUTE ONLY): 23 min   Charges:   PT Evaluation $PT Eval Moderate Complexity: [place], PT Acute Rehabilitation Services  Pager [phone] Office [phone]   Rexanne Mano 03/26/2021, 9:30 AM

## 2021-03-26 NOTE — Procedures (Signed)
Routine EEG Report  Jenna Long is a 87 y.o. female with a history of encephalopathy who is undergoing an EEG to evaluate for seizures.  Report: This EEG was acquired with electrodes placed according to the International 10-20 electrode system (including Fp1, Fp2, F3, F4, C3, C4, P3, P4, O1, O2, T3, T4, T5, T6, A1, A2, Fz, Cz, Pz). The following electrodes were missing or displaced: none.  The occipital dominant rhythm was 6 Hz. This activity is reactive to stimulation. Drowsiness was manifested by background fragmentation; deeper stages of sleep were identified by K complexes and sleep spindles. There was focal slowing over the right hemisphere. There were no interictal epileptiform discharges. There were no electrographic seizures identified. Photic stimulation and hyperventilation were not performed.   Impression and clinical correlation: This EEG was obtained while awake and asleep and is abnormal due to mild diffuse slowing indicative of global cerebral dysfunction. Focal slowing indicates focal cerebral dysfunction superimposed over the right hemisphere. Epileptiform abnormalities were not seen during this recording.  Su Monks, MD [facility] [phone]  If 7pm- 7am, please page neurology on call as listed in [place].

## 2021-03-26 NOTE — Progress Notes (Signed)
EEG done at bedside. No skin breakdown noted. Results pending. 

## 2021-03-26 NOTE — Progress Notes (Signed)
PROGRESS NOTE   Jenna Long  MOQ:[id number] DOB: [birth year] DOA: 03/24/2021 PCP: Sharilyn Sites, MD   Chief Complaint  Patient presents with   Hallucinations     Brief Admission History:  87 y.o. female, with past medical history of temporal arteritis, on prednisone taper, legally blind, diabetes, history of prior CVA, remains on Plavix, lives with her daughter, presents with altered mental status, confusion and visual hallucinations X 1 day. Daughter also reported patient is usually ambulatory with a walker, but she was unable to stand up, which is not her baseline, so she called EMS due to concern for stroke.  Daughter reports patient is compliant with her Plavix and antihypertensive medications. In the ED, EDP noted left-sided weakness, which significantly improved shortly. CT head with no acute findings, no significant lab abnormalities, BP was uncontrolled 200/79. Patient noted to have urinary retention, required 1 time in and out, [facility] consulted to admit for work up for CVA/TIA. Pt was transferred to [facility] as MRI brain unavailable in [place] over the weekend.     Assessment and Plan:  AMS (altered mental status)- (present on admission) ?? TIA Hx of prior CVA Improved CT head with no acute intracranial abnormality MRI with no evidence of acute intracranial abnormality Continue home Plavix PT/OT/SLP-recommend home health PT/OT  ??UTI UA with positive nitrite, negative leukocytes, many bacteria, 6-10 WBC Urine culture pending Start IV ceftriaxone for now pending UC Monitor closely  Mild AKI Cr bumped slightly Pt appears dry Start gentle hydration for now Daily BMP  Essential hypertension- (present on admission) Hypertensive crisis BP improved Continue amlodipine, metoprolol, hold losartan due to mild AKI  Chronic diastolic CHF (congestive heart failure) ([place])- (present on admission) Stable, not on diuretics at baseline  Type 2 diabetes  mellitus with long-term current use of insulin (HCC) A1c 6.6 Continue SSI, lantus, accuchecks, hypoglycemic protocol  Temporal arteritis ([place])- (present on admission) Patient is currently legally blind, she is on prednisone taper    DVT prophylaxis: Lovenox Code Status: DNR Family Communication: None at bedside Disposition: Home with home health Status is: Inpatient    Consultants:  Neurology  Procedures:  None  Antimicrobials:   IV Ceftriaxone    Subjective: Patient denies any new complaints, legally blind. PT at bedside with patient    Objective: Vitals:   03/25/21 2323 03/26/21 0330 03/26/21 0728 03/26/21 1115  BP: (!) 148/87 137/71 (!) 127/94 (!) 128/55  Pulse: 94 (!) 108 90 72  Resp: 18 16 16 16   Temp: 98.5 F (36.9 C) 100.1 F (37.8 C) 98.9 F (37.2 C) 98.4 F (36.9 C)  TempSrc: Oral Oral Oral Oral  SpO2: 99% 97% 97% 95%    Intake/Output Summary (Last 24 hours) at 03/26/2021 1354 Last data filed at 03/26/2021 0349 Gross per 24 hour  Intake --  Output 300 ml  Net -300 ml   There were no vitals filed for this visit.    Examination: General: NAD, legally blind, alert, awake, oriented  Cardiovascular: S1, S2 present Respiratory: CTAB Abdomen: Soft, nontender, nondistended, bowel sounds present Musculoskeletal: No bilateral pedal edema noted Skin: Normal Psychiatry: Normal mood    Data Reviewed: I have personally reviewed following labs and imaging studies  CBC: Recent Labs  Lab 03/24/21 1940 03/26/21 0118  WBC 6.9 8.1  NEUTROABS 3.7 5.2  HGB 14.1 13.8  HCT 42.0 41.1  MCV 96.6 95.1  PLT 292 354    Basic Metabolic Panel: Recent Labs  Lab 03/24/21 1940 03/26/21  0118  NA 139 141  K 3.6 3.9  CL 100 103  CO2 28 28  GLUCOSE 214* 102*  BUN 16 18  CREATININE 0.72 1.17*  CALCIUM 9.6 9.4    CBG: Recent Labs  Lab 03/26/21 0958 03/26/21 1314  GLUCAP 120* 172*    Recent Results (from the past 240 hour(s))  Resp Panel by RT-PCR  (Flu A&B, Covid) Nasopharyngeal Swab     Status: None   Collection Time: 03/24/21  7:14 PM   Specimen: Nasopharyngeal Swab; Nasopharyngeal(NP) swabs in vial transport medium  Result Value Ref Range Status   SARS Coronavirus 2 by RT PCR NEGATIVE NEGATIVE Final    Comment: (NOTE) SARS-CoV-2 target nucleic acids are NOT DETECTED.  The SARS-CoV-2 RNA is generally detectable in upper respiratory specimens during the acute phase of infection. The lowest concentration of SARS-CoV-2 viral copies this assay can detect is 138 copies/mL. A negative result does not preclude SARS-Cov-2 infection and should not be used as the sole basis for treatment or other patient management decisions. A negative result may occur with  improper specimen collection/handling, submission of specimen other than nasopharyngeal swab, presence of viral mutation(s) within the areas targeted by this assay, and inadequate number of viral copies(<138 copies/mL). A negative result must be combined with clinical observations, patient history, and epidemiological information. The expected result is Negative.  Fact Sheet for Patients:  EntrepreneurPulse.com.au  Fact Sheet for Healthcare Providers:  IncredibleEmployment.be  This test is no t yet approved or cleared by the [place] FDA and  has been authorized for detection and/or diagnosis of SARS-CoV-2 by FDA under an Emergency Use Authorization (EUA). This EUA will remain  in effect (meaning this test can be used) for the duration of the COVID-19 declaration under Section 564(b)(1) of the Act, 21 U.S.C.section 360bbb-3(b)(1), unless the authorization is terminated  or revoked sooner.       Influenza A by PCR NEGATIVE NEGATIVE Final   Influenza B by PCR NEGATIVE NEGATIVE Final    Comment: (NOTE) The Xpert Xpress SARS-CoV-2/FLU/RSV plus assay is intended as an aid in the diagnosis of influenza from Nasopharyngeal swab specimens  and should not be used as a sole basis for treatment. Nasal washings and aspirates are unacceptable for Xpert Xpress SARS-CoV-2/FLU/RSV testing.  Fact Sheet for Patients: EntrepreneurPulse.com.au  Fact Sheet for Healthcare Providers: IncredibleEmployment.be  This test is not yet approved or cleared by the [place] FDA and has been authorized for detection and/or diagnosis of SARS-CoV-2 by FDA under an Emergency Use Authorization (EUA). This EUA will remain in effect (meaning this test can be used) for the duration of the COVID-19 declaration under Section 564(b)(1) of the Act, 21 U.S.C. section 360bbb-3(b)(1), unless the authorization is terminated or revoked.  Performed at [facility], [address] [place],  [address]      Radiology Studies: DG Chest 2 View  Result Date: 03/24/2021 CLINICAL DATA:  Altered mental status EXAM: CHEST - 2 VIEW COMPARISON:  12/13/2020 FINDINGS: Cardiac and mediastinal contours are unchanged, when accounting for low lung volumes. No focal pulmonary opacity. No pleural effusion or pneumothorax. No acute osseous abnormality. IMPRESSION: No acute cardiopulmonary process. Electronically Signed   By: Merilyn Baba M.D.   On: 03/24/2021 20:12   CT HEAD WO CONTRAST  Result Date: 03/24/2021 CLINICAL DATA:  Mental status change, unknown cause. Additional history provided: Headache, elevated blood pressure. EXAM: CT HEAD WITHOUT CONTRAST TECHNIQUE: Contiguous axial images were obtained from the base of the skull through  the vertex without intravenous contrast. RADIATION DOSE REDUCTION: This exam was performed according to the departmental dose-optimization program which includes automated exposure control, adjustment of the mA and/or kV according to patient size and/or use of iterative reconstruction technique. COMPARISON:  Brain MRI 12/13/2020. CT angiogram head/neck 11/14/2020. FINDINGS: Brain: Mild generalized  cerebral and cerebellar atrophy. Commensurate prominence of the ventricles and sulci. Mild to moderate patchy and ill-defined hypoattenuation within the cerebral white matter, nonspecific but compatible with chronic small vessel ischemic disease. There is no acute intracranial hemorrhage. No demarcated cortical infarct. No extra-axial fluid collection. No evidence of an intracranial mass. No midline shift. Vascular: No hyperdense vessel.  Atherosclerotic calcifications. Skull: Normal. Negative for fracture or focal lesion. Sinuses/Orbits: No acute orbital finding. Mild mucosal thickening within the bilateral ethmoid sinuses. IMPRESSION: No evidence of acute intracranial abnormality. Mild to moderate chronic small vessel ischemic changes within the cerebral white matter, stable. Mild generalized cerebral and cerebellar atrophy. Mild mucosal thickening within the bilateral ethmoid sinuses. Electronically Signed   By: Kellie Simmering D.O.   On: 03/24/2021 20:23   MR BRAIN WO CONTRAST  Result Date: 03/25/2021 CLINICAL DATA:  Provided history: Transient ischemic attack (TIA), weakness, altered mental status. Additional history provided: History of temporal arteritis. EXAM: MRI HEAD WITHOUT CONTRAST TECHNIQUE: Multiplanar, multiecho pulse sequences of the brain and surrounding structures were obtained without intravenous contrast. COMPARISON:  Head CT 03/24/2021.  Brain MRI 12/13/2020. FINDINGS: Brain: Mild generalized cerebral and cerebellar atrophy. 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 Redemonstrated tiny chronic infarct within the inferior right cerebellar hemisphere. There is no acute infarct. No evidence of an intracranial mass. No chronic intracranial blood products. No extra-axial fluid collection. No midline  shift. Vascular: Maintained flow voids within the proximal large arterial vessels. Skull and upper cervical spine: No focal suspicious marrow lesion. Sinuses/Orbits: Visualized orbits show no acute finding. Trace scattered paranasal sinus mucosal thickening. IMPRESSION: No evidence of acute intracranial abnormality. Redemonstrated chronic lacunar infarct within the left corona radiata. Stable background mild-to-moderate chronic small-vessel ischemic changes within the cerebral white matter and pons. Redemonstrated tiny chronic infarct within the right cerebellar hemisphere. Mild generalized cerebral and cerebellar atrophy. Electronically Signed   By: Kellie Simmering D.O.   On: 03/25/2021 13:56    Scheduled Meds:  amLODipine  10 mg Oral Daily   atorvastatin  40 mg Oral Daily   clopidogrel  75 mg Oral Daily   dorzolamide  1 drop Both Eyes BID   heparin  5,000 Units Subcutaneous Q8H   insulin aspart  0-5 Units Subcutaneous QHS   insulin aspart  0-9 Units Subcutaneous TID WC   insulin glargine-yfgn  8 Units Subcutaneous Daily   multivitamin with minerals  1 tablet Oral Daily   predniSONE  10 mg Oral Q breakfast   Continuous Infusions:  sodium chloride 75 mL/hr at 03/26/21 0901     LOS: 0 days     Alma Friendly, MD   03/26/2021, 1:54 PM

## 2021-03-26 NOTE — Care Management Obs Status (Signed)
[place] NOTIFICATION   Patient Details  Name: IVAL BASQUEZ MRN: [id number] Date of Birth: Jun 22, 1934   Medicare Observation Status Notification Given:  Yes    Carles Collet, RN 03/26/2021, 9:17 AM

## 2021-03-26 NOTE — Plan of Care (Signed)
  Problem: Education: Goal: Knowledge of General Education information will improve Description: Including pain rating scale, medication(s)/side effects and non-pharmacologic comfort measures Outcome: Progressing   Problem: Safety: Goal: Ability to remain free from injury will improve Outcome: Progressing   Problem: Skin Integrity: Goal: Risk for impaired skin integrity will decrease Outcome: Progressing   

## 2021-03-26 NOTE — Evaluation (Addendum)
Occupational Therapy Evaluation Patient Details Name: Jenna Long MRN: [id number] DOB: [birth year] Today's Date: 03/26/2021   History of Present Illness 87 y.o. female was noted to have altered mental status, confusion, and hallucinations was brought to ED 03/24/21. Left sided weakness; CT head and MRI brain with no acute findings; BP 200/79; +urinary retention;  PMH-temporal arteritis, on prednisone taper, legally blind, diabetes, history of prior CVA last October   Clinical Impression   PT admitted for concerns listed above. PTA pt's family reported that she was requiring assist with all ADL's and functional mobility. At this time, pt presented with increased lethargy, unable to participate in more than bed mobility, where she required max A to come to sitting and maintain up right posture.  Pt needs increased assist for all ADL's and functional mobility. Pt currently requires 2 person assisst for ambulation with RW due to posterior lean. Daughter informed and states if pt needs continued therapy, she would want to take pt home in a wheelchair and have Jenna Long services--she does not want SNF. OT will continue to follow acutely to progress functional mobility and ADL performance.      Recommendations for follow up therapy are one component of a multi-disciplinary discharge planning process, led by the attending physician.  Recommendations may be updated based on patient status, additional functional criteria and insurance authorization.   Follow Up Recommendations  Home health OT    Assistance Recommended at Discharge Frequent or constant Supervision/Assistance  Patient can return home with the following A lot of help with walking and/or transfers;A lot of help with bathing/dressing/bathroom;Assistance with cooking/housework;Direct supervision/assist for medications management;Direct supervision/assist for financial management    Functional Status Assessment  Patient has had a recent decline in their  functional status and demonstrates the ability to make significant improvements in function in a reasonable and predictable amount of time.  Equipment Recommendations  Other (comment) (TBD)    Recommendations for Other Services       Precautions / Restrictions Precautions Precautions: Fall Restrictions Weight Bearing Restrictions: No      Mobility Bed Mobility Overal bed mobility: Needs Assistance Bed Mobility: Supine to Sit     Supine to sit: Max assist     General bed mobility comments: Pt requiring max A due to fatigue, most likely can assist more when pt is more awake    Transfers                   General transfer comment: Defered due to pt fatigue/lethargy      Balance Overall balance assessment: Needs assistance Sitting-balance support: Feet supported, Bilateral upper extremity supported Sitting balance-Leahy Scale: Poor Sitting balance - Comments: Pt requiring constant support, not openeing eyes except for 1 time in sitting. Postural control: Posterior lean                                 ADL either performed or assessed with clinical judgement   ADL Overall ADL's : Needs assistance/impaired                                       General ADL Comments: Pt highly limited by fatigue this session, will need further evaluation. At this time, requiring max A for all ADL's.     Vision Baseline Vision/History: 1 Wears glasses Ability to See in Adequate  Light: 2 Moderately impaired Patient Visual Report: No change from baseline Vision Assessment?: Vision impaired- to be further tested in functional context Additional Comments: Pt not opening her eyes for long this session due to fatigue, needs further assessment     Perception     Praxis      Pertinent Vitals/Pain Pain Assessment Pain Assessment: No/denies pain     Hand Dominance Right   Extremity/Trunk Assessment Upper Extremity Assessment Upper Extremity  Assessment: Generalized weakness   Lower Extremity Assessment Lower Extremity Assessment: Defer to PT evaluation   Cervical / Trunk Assessment Cervical / Trunk Assessment: Normal   Communication Communication Communication: HOH   Cognition Arousal/Alertness: Awake/alert Behavior During Therapy: Flat affect Overall Cognitive Status: No family/caregiver present to determine baseline cognitive functioning                                 General Comments: oriented to self, DOB, Jenna Long     General Comments  VSS on RA, pt very lethargic this session, unable to participate much    Exercises     Shoulder Instructions      Home Living Family/patient expects to be discharged to:: Private residence Living Arrangements: Children Available Help at Discharge: Family;Available 24 hours/day (daughter, son in Sports coach, grandson (nearby)) Type of Home: House Home Access: Stairs to enter Technical brewer of Steps: 3 Entrance Stairs-Rails: Right;Left;Can reach both Home Layout: One level     Bathroom Shower/Tub: Tub/shower unit;Door   [employer] Toilet: Handicapped height Bathroom Accessibility: Yes   Home Equipment: Conservation officer, nature (2 wheels);Cane - single point;BSC/3in1;Shower seat;Wheelchair - manual;Grab bars - tub/shower;Toilet riser   Additional Comments: PT called daughter to confirm information      Prior Functioning/Environment Prior Level of Function : Needs assist             Mobility Comments: uses RW with guidance due to low vision; needed 4 person assist up/down steps into home due to low vision and imbalance/weakness ADLs Comments: bathes at sink due to tub being too tall to step over; setup for bathing; assist with dressing due to low vision        OT Problem List: Decreased strength;Decreased range of motion;Decreased activity tolerance;Impaired balance (sitting and/or standing);Decreased coordination;Decreased cognition;Decreased safety  awareness      OT Treatment/Interventions: Self-care/ADL training;Therapeutic exercise;Energy conservation;DME and/or AE instruction;Therapeutic activities;Patient/family education;Balance training;Cognitive remediation/compensation    OT Goals(Current goals can be found in the care plan section) Acute Rehab OT Goals Patient Stated Goal: None stated OT Goal Formulation: Patient unable to participate in goal setting Time For Goal Achievement: 04/09/21 Potential to Achieve Goals: Fair ADL Goals Pt Will Perform Grooming: with set-up;sitting Pt Will Perform Lower Body Bathing: with min assist;sitting/lateral leans;sit to/from stand Pt Will Perform Lower Body Dressing: with min assist;sitting/lateral leans;sit to/from stand Pt Will Transfer to Toilet: with min assist;stand pivot transfer Pt Will Perform Toileting - Clothing Manipulation and hygiene: with min assist;sitting/lateral leans;sit to/from stand Additional ADL Goal #1: Pt will complete bed mobility with min guard as a precursor to seated ADL's.  OT Frequency: Min 2X/week    Co-evaluation              AM-PAC OT "6 Clicks" Daily Activity     Outcome Measure Help from another person eating meals?: A Lot Help from another person taking care of personal grooming?: A Lot Help from another person toileting, which includes using toliet, bedpan, or  urinal?: A Lot Help from another person bathing (including washing, rinsing, drying)?: A Lot Help from another person to put on and taking off regular upper body clothing?: A Lot Help from another person to put on and taking off regular lower body clothing?: A Lot 6 Click Score: 12   End of Session Nurse Communication: Mobility status  Activity Tolerance: Patient limited by lethargy Patient left: in bed;with call bell/phone within reach;with bed alarm set  OT Visit Diagnosis: Unsteadiness on feet (R26.81);Other abnormalities of gait and mobility (R26.89);Muscle weakness (generalized)  (M62.81)                Time: 1352-1401 OT Time Calculation (min): 9 min Charges:  OT General Charges $OT Visit: 1 Visit OT Evaluation $OT Eval Moderate Complexity: 1 Mod  Peyten Punches H., OTR/L Acute Rehabilitation  Haylen Shelnutt Elane Yolanda Bonine 03/26/2021, 5:46 PM

## 2021-03-26 NOTE — TOC Initial Note (Signed)
Transition of Care [facility]) - Initial/Assessment Note    Patient Details  Name: Jenna Long MRN: [id number] Date of Birth: August 22, 1934  Transition of Care [facility]) CM/SW Contact:    Carles Collet, RN Phone Number: 03/26/2021, 9:27 AM  Clinical Narrative:                Damaris Schooner w patient's daughter Butch Penny. She states that her mother lives with her. Butch Penny provides direct care and supervision.  Butch Penny would like for patient to return home even if SNF is recommended. Butch Penny has WC RW 3/1 and shower seat at home for her mother. They have used [facility] a few months ago and would like to use them again. Liaison accepted.  Please order home health PT OT with face to face.     Expected Discharge Plan: [place] Barriers to Discharge: Continued Medical Work up   Patient Goals and CMS Choice Patient states their goals for this hospitalization and ongoing recovery are:: return home per daughter CMS Medicare.gov Compare Post Acute Care list provided to:: Other (Comment Required) Choice offered to / list presented to : Adult Children  Expected Discharge Plan and Services Expected Discharge Plan: [place]   Discharge Planning Services: CM Consult Post Acute Care Choice: Home Health Living arrangements for the past 2 months: [place]: [place] Date [facility] Agency Contacted: 03/26/21 Time HH Agency Contacted: [phone] Representative spoke with at [place]: [place] Arrangements/Services Living arrangements for the past 2 months: [place] with:: Adult Children   Do you feel safe going back to the place where you live?: Yes          Current home services: DME    Activities of Daily Living Home Assistive Devices/Equipment: Environmental consultant (specify type), Bedside commode/3-in-1, Wheelchair, Shower chair without back ADL Screening (condition at time of admission) Patient's cognitive  ability adequate to safely complete daily activities?: Yes Is the patient deaf or have difficulty hearing?: Yes Does the patient have difficulty seeing, even when wearing glasses/contacts?: Yes Does the patient have difficulty concentrating, remembering, or making decisions?: Yes Patient able to express need for assistance with ADLs?: Yes Does the patient have difficulty dressing or bathing?: No Independently performs ADLs?: Yes (appropriate for developmental age) Does the patient have difficulty walking or climbing stairs?: No Weakness of Legs: Both Weakness of Arms/Hands: Both  Permission Sought/Granted                  Emotional Assessment              Admission diagnosis:  Visual hallucinations [R44.1] Stroke-like symptoms [R29.90] AMS (altered mental status) [R41.82] Patient Active Problem List   Diagnosis Date Noted   AMS (altered mental status) 03/24/2021   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 ([place]) 12/13/2020   Mixed diabetic hyperlipidemia associated with type 2 diabetes mellitus ([place]) 12/13/2020   Acute cystitis without hematuria 12/13/2020   Chronic diastolic CHF (congestive heart failure) ([place]) 12/13/2020   Vision loss of right eye    Dyslipidemia    Type 2 diabetes mellitus with hyperglycemia, with long-term current use of insulin ([place])    Cerebral thrombosis with cerebral infarction 11/09/2020   Temporal arteritis ([place]) 11/08/2020   Diabetes mellitus type 2  in nonobese [facility]) 11/08/2020   DNR (do not resuscitate) 11/08/2020   Left displaced femoral neck fracture ([place]) 02/18/2020   Essential hypertension 02/18/2020   Mixed hyperlipidemia 02/18/2020   Fall    PCP:  Sharilyn Sites, MD Pharmacy:   [place], [place] [address] [place] [address] Phone: [phone] Fax: [phone]     Social Determinants of Health (SDOH) Interventions    Readmission Risk Interventions No flowsheet data  found.

## 2021-03-27 ENCOUNTER — Other Ambulatory Visit: Payer: Self-pay

## 2021-03-27 DIAGNOSIS — I10 Essential (primary) hypertension: Secondary | ICD-10-CM | POA: Diagnosis not present

## 2021-03-27 DIAGNOSIS — E119 Type 2 diabetes mellitus without complications: Secondary | ICD-10-CM | POA: Diagnosis not present

## 2021-03-27 DIAGNOSIS — R4182 Altered mental status, unspecified: Secondary | ICD-10-CM | POA: Diagnosis not present

## 2021-03-27 DIAGNOSIS — I5032 Chronic diastolic (congestive) heart failure: Secondary | ICD-10-CM | POA: Diagnosis not present

## 2021-03-27 LAB — CBC WITH DIFFERENTIAL/PLATELET
Abs Immature Granulocytes: 0.03 10*3/uL (ref 0.00–0.07)
Basophils Absolute: 0 10*3/uL (ref 0.0–0.1)
Basophils Relative: 0 %
Eosinophils Absolute: 0 10*3/uL (ref 0.0–0.5)
Eosinophils Relative: 0 %
HCT: 31.6 % — ABNORMAL LOW (ref 36.0–46.0)
Hemoglobin: 10.6 g/dL — ABNORMAL LOW (ref 12.0–15.0)
Immature Granulocytes: 1 %
Lymphocytes Relative: 39 %
Lymphs Abs: 2 10*3/uL (ref 0.7–4.0)
MCH: 32 pg (ref 26.0–34.0)
MCHC: 33.5 g/dL (ref 30.0–36.0)
MCV: 95.5 fL (ref 80.0–100.0)
Monocytes Absolute: 0.7 10*3/uL (ref 0.1–1.0)
Monocytes Relative: 13 %
Neutro Abs: 2.4 10*3/uL (ref 1.7–7.7)
Neutrophils Relative %: 47 %
Platelets: 227 10*3/uL (ref 150–400)
RBC: 3.31 MIL/uL — ABNORMAL LOW (ref 3.87–5.11)
RDW: 14.5 % (ref 11.5–15.5)
WBC: 5.2 10*3/uL (ref 4.0–10.5)
nRBC: 0 % (ref 0.0–0.2)

## 2021-03-27 LAB — BASIC METABOLIC PANEL
Anion gap: 8 (ref 5–15)
BUN: 36 mg/dL — ABNORMAL HIGH (ref 8–23)
CO2: 25 mmol/L (ref 22–32)
Calcium: 8.6 mg/dL — ABNORMAL LOW (ref 8.9–10.3)
Chloride: 105 mmol/L (ref 98–111)
Creatinine, Ser: 1.15 mg/dL — ABNORMAL HIGH (ref 0.44–1.00)
GFR, Estimated: 47 mL/min — ABNORMAL LOW (ref >60)
Glucose, Bld: 107 mg/dL — ABNORMAL HIGH (ref 70–99)
Potassium: 3.4 mmol/L — ABNORMAL LOW (ref 3.5–5.1)
Sodium: 138 mmol/L (ref 135–145)

## 2021-03-27 LAB — GLUCOSE, CAPILLARY
Glucose-Capillary: 102 mg/dL — ABNORMAL HIGH (ref 70–99)
Glucose-Capillary: 98 mg/dL (ref 70–99)
Glucose-Capillary: 203 mg/dL — ABNORMAL HIGH (ref 70–99)
Glucose-Capillary: 232 mg/dL — ABNORMAL HIGH (ref 70–99)
Glucose-Capillary: 116 mg/dL — ABNORMAL HIGH (ref 70–99)

## 2021-03-27 MED ORDER — POTASSIUM CHLORIDE CRYS ER 20 MEQ PO TBCR
40.0000 meq | EXTENDED_RELEASE_TABLET | Freq: Once | ORAL | Status: AC
  Administered 2021-03-27: 40 meq via ORAL
  Filled 2021-03-27: qty 2

## 2021-03-27 MED ORDER — TRAZODONE HCL 50 MG PO TABS: 50.0000 mg | ORAL_TABLET | Freq: Every evening | ORAL | Status: DC | PRN

## 2021-03-27 NOTE — Progress Notes (Signed)
Physical Therapy Treatment Patient Details Name: Jenna Long MRN: [id number] DOB: [birth year] Today's Date: 03/27/2021   History of Present Illness 87 y.o. female was noted to have altered mental status, confusion, and hallucinations was brought to ED 03/24/21. Left sided weakness; CT head and MRI brain with no acute findings; BP 200/79; +urinary retention;  PMH-temporal arteritis, on prednisone taper, legally blind, diabetes, history of prior CVA last October    PT Comments    Pt much improved mentally and physically per daughter, who states she is good enough now to go how when released.  Emphasis on warm up exercise in bed, transition and transfer safety plus progression of gait stability with the RW.   Recommendations for follow up therapy are one component of a multi-disciplinary discharge planning process, led by the attending physician.  Recommendations may be updated based on patient status, additional functional criteria and insurance authorization.  Follow Up Recommendations  Home health PT     Assistance Recommended at Discharge Frequent or constant Supervision/Assistance  Patient can return home with the following A little help with walking and/or transfers;A little help with bathing/dressing/bathroom;Assistance with cooking/housework;Direct supervision/assist for medications management;Direct supervision/assist for financial management;Assist for transportation;Help with stairs or ramp for entrance   Equipment Recommendations  None recommended by PT    Recommendations for Other Services       Precautions / Restrictions Precautions Precautions: Fall Restrictions Weight Bearing Restrictions: No     Mobility  Bed Mobility Overal bed mobility: Needs Assistance Bed Mobility: Supine to Sit     Supine to sit: Min assist     General bed mobility comments: up via L UE,  pt directing what she needed to come to EOB    Transfers Overall transfer level: Needs  assistance Equipment used: Rolling walker (2 wheels) Transfers: Sit to/from Stand Sit to Stand: Min assist           General transfer comment: cues for direction,.  Assist forward and to boost.    Ambulation/Gait Ambulation/Gait assistance: Min assist, Mod assist Gait Distance (Feet): 80 Feet Assistive device: Rolling walker (2 wheels) Gait Pattern/deviations: Step-through pattern, Decreased step length - right, Decreased step length - left, Decreased stride length Gait velocity: slower Gait velocity interpretation: <1.31 ft/sec, indicative of household ambulator   General Gait Details: pt needing frequent multimodal cues, direction of the RW and stability assist, but due to blindness, never felt confident or comfortable.   Stairs             Wheelchair Mobility    Modified Rankin (Stroke Patients Only)       Balance Overall balance assessment: Needs assistance Sitting-balance support: Single extremity supported, No upper extremity supported, Feet supported Sitting balance-Leahy Scale: Fair     Standing balance support: Bilateral upper extremity supported, Reliant on assistive device for balance Standing balance-Leahy Scale: Poor Standing balance comment: mild posterior lean                            Cognition Arousal/Alertness: Awake/alert Behavior During Therapy: WFL for tasks assessed/performed Overall Cognitive Status:  (Still mildly confused, but closer to baseline per daughter)                                          Exercises      General Comments General comments (skin integrity, edema,  etc.): VSS on RA,  awake, joking      Pertinent Vitals/Pain Pain Assessment Pain Assessment: Faces Faces Pain Scale: No hurt Pain Intervention(s): Monitored during session    Home Living                          Prior Function            PT Goals (current goals can now be found in the care plan section) Acute  Rehab PT Goals Patient Stated Goal: wants to go home today PT Goal Formulation: With patient Time For Goal Achievement: 04/09/21 Potential to Achieve Goals: Fair Progress towards PT goals: Progressing toward goals    Frequency    Min 3X/week      PT Plan Current plan remains appropriate    Co-evaluation              AM-PAC PT "6 Clicks" Mobility   Outcome Measure  Help needed turning from your back to your side while in a flat bed without using bedrails?: A Little Help needed moving from lying on your back to sitting on the side of a flat bed without using bedrails?: A Little Help needed moving to and from a bed to a chair (including a wheelchair)?: A Lot Help needed standing up from a chair using your arms (e.g., wheelchair or bedside chair)?: A Little Help needed to walk in hospital room?: A Lot Help needed climbing 3-5 steps with a railing? : A Lot 6 Click Score: 15    End of Session Equipment Utilized During Treatment: Gait belt Activity Tolerance: Patient tolerated treatment well Patient left: in chair;with call bell/phone within reach;with chair alarm set;with family/visitor present Nurse Communication: Mobility status PT Visit Diagnosis: Other abnormalities of gait and mobility (R26.89);Difficulty in walking, not elsewhere classified (R26.2)     Time: 8299-3716 PT Time Calculation (min) (ACUTE ONLY): 25 min  Charges:  $Gait Training: 8-22 mins $Therapeutic Activity: 8-22 mins                     03/27/2021  Ginger Carne., PT Acute Rehabilitation Services [phone]  (pager) [phone]  (office)   Tessie Fass Forrester [place] 03/27/2021, 11:08 AM

## 2021-03-27 NOTE — Progress Notes (Addendum)
PROGRESS NOTE   Jenna Long  FGH:[id number] DOB: 10-04-1934 DOA: 03/24/2021 PCP: Jenna Sites, MD   Chief Complaint  Patient presents with   Hallucinations     Brief Admission History:  87 y.o. female, with past medical history of temporal arteritis, on prednisone taper, legally blind, diabetes, history of prior CVA, remains on Plavix, lives with her daughter, presents with altered mental status, confusion and visual hallucinations X 1 day. Daughter also reported patient is usually ambulatory with a walker, but she was unable to stand up, which is not her baseline, so she called EMS due to concern for stroke.  Daughter reports patient is compliant with her Plavix and antihypertensive medications. In the ED, EDP noted left-sided weakness, which significantly improved shortly. CT head with no acute findings, no significant lab abnormalities, BP was uncontrolled 200/79. Patient noted to have urinary retention, required 1 time in and out, [facility] hospitalist consulted to admit for work up for CVA/TIA. Pt was transferred to [facility] as MRI brain unavailable in [place] over the weekend.     Assessment and Plan:  AMS (altered mental status)- (present on admission) ?? TIA Hx of prior CVA Improved CT head with no acute intracranial abnormality MRI with no evidence of acute intracranial abnormality Continue home Plavix PT/OT/SLP-recommend home health PT/OT  UTI UA with positive nitrite, negative leukocytes, many bacteria, 6-10 WBC Urine culture with >100, 000 E.coli BC X 2 NGTD Continue IV ceftriaxone for now pending final UC result Monitor closely  Mild AKI Cr bumped slightly Pt appears dry Continue gentle hydration for now Daily BMP  Hypokalemia Replace prn  Normocytic anemia Noted drop in hemoglobin, no evidence of bleeding Likely hemodilutional with fluids, in addition to dehydration upon presentation Baseline hemoglobin around 12 CBC in a.m.  Essential  hypertension- (present on admission) Hypertensive crisis BP improved Continue amlodipine, metoprolol, hold losartan due to mild AKI  Chronic diastolic CHF (congestive heart failure) ([place])- (present on admission) Stable, not on diuretics at baseline  Type 2 diabetes mellitus with long-term current use of insulin (HCC) A1c 6.6 Continue SSI, lantus, accuchecks, hypoglycemic protocol  Temporal arteritis (Jenna Long)- (present on admission) Patient is currently legally blind, she is on prednisone taper    DVT prophylaxis: Lovenox Code Status: DNR Family Communication: Discussed with daughter on 03/26/21 Disposition: Home with home health Status is: Inpatient    Consultants:  Neurology  Procedures:  None  Antimicrobials:   IV Ceftriaxone    Subjective: Patient denies any new complaints    Objective: Vitals:   03/27/21 0323 03/27/21 0734 03/27/21 1125 03/27/21 1517  BP: (!) 137/58 (!) 115/52 129/69 (!) 115/58  Pulse: 74 61 82 83  Resp: 15 14  18   Temp: 98.3 F (36.8 C) 98.3 F (36.8 C) 98.6 F (37 C) 99.1 F (37.3 C)  TempSrc: Oral Oral Oral Oral  SpO2: 98% 98% 98% 97%    Intake/Output Summary (Last 24 hours) at 03/27/2021 1550 Last data filed at 03/27/2021 0900 Gross per 24 hour  Intake 480 ml  Output 750 ml  Net -270 ml   There were no vitals filed for this visit.    Examination: General: NAD, legally blind, alert, awake, oriented  Cardiovascular: S1, S2 present Respiratory: CTAB Abdomen: Soft, nontender, nondistended, bowel sounds present Musculoskeletal: No bilateral pedal edema noted Skin: Normal Psychiatry: Normal mood    Data Reviewed: I have personally reviewed following labs and imaging studies  CBC: Recent Labs  Lab 03/24/21 1940 03/26/21 0118 03/27/21  0013  WBC 6.9 8.1 5.2  NEUTROABS 3.7 5.2 2.4  HGB 14.1 13.8 10.6*  HCT 42.0 41.1 31.6*  MCV 96.6 95.1 95.5  PLT 292 271 201    Basic Metabolic Panel: Recent Labs  Lab 03/24/21 1940  03/26/21 0118 03/27/21 0013  NA 139 141 138  K 3.6 3.9 3.4*  CL 100 103 105  CO2 28 28 25   GLUCOSE 214* 102* 107*  BUN 16 18 36*  CREATININE 0.72 1.17* 1.15*  CALCIUM 9.6 9.4 8.6*  MG  --  1.7  --     CBG: Recent Labs  Lab 03/26/21 1647 03/26/21 2109 03/27/21 0623 03/27/21 0736 03/27/21 1127  GLUCAP 142* 170* 102* 98 203*    Recent Results (from the past 240 hour(s))  Resp Panel by RT-PCR (Flu A&B, Covid) Nasopharyngeal Swab     Status: None   Collection Time: 03/24/21  7:14 PM   Specimen: Nasopharyngeal Swab; Nasopharyngeal(NP) swabs in vial transport medium  Result Value Ref Range Status   SARS Coronavirus 2 by RT PCR NEGATIVE NEGATIVE Final    Comment: (NOTE) SARS-CoV-2 target nucleic acids are NOT DETECTED.  The SARS-CoV-2 RNA is generally detectable in upper respiratory specimens during the acute phase of infection. The lowest concentration of SARS-CoV-2 viral copies this assay can detect is 138 copies/mL. A negative result does not preclude SARS-Cov-2 infection and should not be used as the sole basis for treatment or other patient management decisions. A negative result may occur with  improper specimen collection/handling, submission of specimen other than nasopharyngeal swab, presence of viral mutation(s) within the areas targeted by this assay, and inadequate number of viral copies(<138 copies/mL). A negative result must be combined with clinical observations, patient history, and epidemiological information. The expected result is Negative.  Fact Sheet for Patients:  EntrepreneurPulse.com.au  Fact Sheet for Healthcare Providers:  IncredibleEmployment.be  This test is no t yet approved or cleared by the [place] FDA and  has been authorized for detection and/or diagnosis of SARS-CoV-2 by FDA under an Emergency Use Authorization (EUA). This EUA will remain  in effect (meaning this test can be used) for the  duration of the COVID-19 declaration under Section 564(b)(1) of the Act, 21 U.S.C.section 360bbb-3(b)(1), unless the authorization is terminated  or revoked sooner.       Influenza A by PCR NEGATIVE NEGATIVE Final   Influenza B by PCR NEGATIVE NEGATIVE Final    Comment: (NOTE) The Xpert Xpress SARS-CoV-2/FLU/RSV plus assay is intended as an aid in the diagnosis of influenza from Nasopharyngeal swab specimens and should not be used as a sole basis for treatment. Nasal washings and aspirates are unacceptable for Xpert Xpress SARS-CoV-2/FLU/RSV testing.  Fact Sheet for Patients: EntrepreneurPulse.com.au  Fact Sheet for Healthcare Providers: IncredibleEmployment.be  This test is not yet approved or cleared by the [place] FDA and has been authorized for detection and/or diagnosis of SARS-CoV-2 by FDA under an Emergency Use Authorization (EUA). This EUA will remain in effect (meaning this test can be used) for the duration of the COVID-19 declaration under Section 564(b)(1) of the Act, 21 U.S.C. section 360bbb-3(b)(1), unless the authorization is terminated or revoked.  Performed at [facility], [address], [place], [place] [address]   Urine Culture     Status: Abnormal (Preliminary result)   Collection Time: 03/25/21 11:30 PM   Specimen: Urine, Clean Catch  Result Value Ref Range Status   Specimen Description URINE, CLEAN CATCH  Final   Special Requests NONE  Final   Culture (A)  Final    >=100,000 COLONIES/mL ESCHERICHIA COLI SUSCEPTIBILITIES TO FOLLOW Performed at [facility], [place] [address], [place], [place] [address]    Report Status PENDING  Incomplete  Culture, blood (routine x 2)     Status: None (Preliminary result)   Collection Time: 04-18-21 12:10 AM   Specimen: BLOOD RIGHT HAND  Result Value Ref Range Status   Specimen Description BLOOD RIGHT HAND  Final   Special Requests   Final    BOTTLES DRAWN AEROBIC AND  ANAEROBIC Blood Culture results may not be optimal due to an excessive volume of blood received in culture bottles   Culture   Final    NO GROWTH 1 DAY Performed at [facility], [place] [address], [place], [place] [address]    Report Status PENDING  Incomplete  Culture, blood (routine x 2)     Status: None (Preliminary result)   Collection Time: 2021-04-18  1:17 AM   Specimen: BLOOD  Result Value Ref Range Status   Specimen Description BLOOD LEFT ANTECUBITAL  Final   Special Requests   Final    BOTTLES DRAWN AEROBIC AND ANAEROBIC Blood Culture results may not be optimal due to an excessive volume of blood received in culture bottles   Culture   Final    NO GROWTH 1 DAY Performed at [facility], [place] [address], [place], [place] [address]    Report Status PENDING  Incomplete     Radiology Studies: EEG adult  Result Date: 04/18/2021 Derek Jack, MD     2021-04-18  3:24 PM Routine EEG Report LEROY TRIM is a 87 y.o. female with a history of encephalopathy who is undergoing an EEG to evaluate for seizures. Report: This EEG was acquired with electrodes placed according to the International 10-20 electrode system (including Fp1, Fp2, F3, F4, C3, C4, P3, P4, O1, O2, T3, T4, T5, T6, A1, A2, Fz, Cz, Pz). The following electrodes were missing or displaced: none. The occipital dominant rhythm was 6 Hz. This activity is reactive to stimulation. Drowsiness was manifested by background fragmentation; deeper stages of sleep were identified by K complexes and sleep spindles. There was focal slowing over the right hemisphere. There were no interictal epileptiform discharges. There were no electrographic seizures identified. Photic stimulation and hyperventilation were not performed. Impression and clinical correlation: This EEG was obtained while awake and asleep and is abnormal due to mild diffuse slowing indicative of global cerebral dysfunction. Focal slowing indicates focal cerebral  dysfunction superimposed over the right hemisphere. Epileptiform abnormalities were not seen during this recording. Su Monks, MD [facility] [phone] If 7pm- 7am, please page neurology on call as listed in [place].    Scheduled Meds:  amLODipine  10 mg Oral Daily   atorvastatin  40 mg Oral Daily   clopidogrel  75 mg Oral Daily   dorzolamide  1 drop Both Eyes BID   enoxaparin (LOVENOX) injection  40 mg Subcutaneous Q24H   insulin aspart  0-5 Units Subcutaneous QHS   insulin aspart  0-9 Units Subcutaneous TID WC   insulin glargine-yfgn  8 Units Subcutaneous Daily   multivitamin with minerals  1 tablet Oral Daily   predniSONE  10 mg Oral Q breakfast   Continuous Infusions:  sodium chloride 75 mL/hr at 04/18/21 1748   cefTRIAXone (ROCEPHIN)  IV 1 g (2021-04-18 1600)     LOS: 1 day     Alma Friendly, MD  03/27/2021, 3:50 PM

## 2021-03-28 DIAGNOSIS — N3000 Acute cystitis without hematuria: Secondary | ICD-10-CM

## 2021-03-28 DIAGNOSIS — N179 Acute kidney failure, unspecified: Secondary | ICD-10-CM

## 2021-03-28 DIAGNOSIS — E782 Mixed hyperlipidemia: Secondary | ICD-10-CM

## 2021-03-28 LAB — BASIC METABOLIC PANEL
Anion gap: 8 (ref 5–15)
BUN: 23 mg/dL (ref 8–23)
CO2: 23 mmol/L (ref 22–32)
Calcium: 9 mg/dL (ref 8.9–10.3)
Chloride: 108 mmol/L (ref 98–111)
Creatinine, Ser: 0.81 mg/dL (ref 0.44–1.00)
GFR, Estimated: >60 mL/min (ref >60)
Glucose, Bld: 113 mg/dL — ABNORMAL HIGH (ref 70–99)
Potassium: 3.3 mmol/L — ABNORMAL LOW (ref 3.5–5.1)
Sodium: 139 mmol/L (ref 135–145)

## 2021-03-28 LAB — URINE CULTURE: Culture: >=100000 — AB

## 2021-03-28 LAB — CBC WITH DIFFERENTIAL/PLATELET
Abs Immature Granulocytes: 0.03 10*3/uL (ref 0.00–0.07)
Basophils Absolute: 0 10*3/uL (ref 0.0–0.1)
Basophils Relative: 0 %
Eosinophils Absolute: 0 10*3/uL (ref 0.0–0.5)
Eosinophils Relative: 0 %
HCT: 33.9 % — ABNORMAL LOW (ref 36.0–46.0)
Hemoglobin: 11.3 g/dL — ABNORMAL LOW (ref 12.0–15.0)
Immature Granulocytes: 1 %
Lymphocytes Relative: 32 %
Lymphs Abs: 1.7 10*3/uL (ref 0.7–4.0)
MCH: 31.9 pg (ref 26.0–34.0)
MCHC: 33.3 g/dL (ref 30.0–36.0)
MCV: 95.8 fL (ref 80.0–100.0)
Monocytes Absolute: 0.6 10*3/uL (ref 0.1–1.0)
Monocytes Relative: 12 %
Neutro Abs: 3 10*3/uL (ref 1.7–7.7)
Neutrophils Relative %: 55 %
Platelets: 237 10*3/uL (ref 150–400)
RBC: 3.54 MIL/uL — ABNORMAL LOW (ref 3.87–5.11)
RDW: 13.9 % (ref 11.5–15.5)
WBC: 5.4 10*3/uL (ref 4.0–10.5)
nRBC: 0 % (ref 0.0–0.2)

## 2021-03-28 LAB — GLUCOSE, CAPILLARY
Glucose-Capillary: 110 mg/dL — ABNORMAL HIGH (ref 70–99)
Glucose-Capillary: 150 mg/dL — ABNORMAL HIGH (ref 70–99)

## 2021-03-28 LAB — MAGNESIUM: Magnesium: 1.9 mg/dL (ref 1.7–2.4)

## 2021-03-28 MED ORDER — CEPHALEXIN 500 MG PO CAPS: 500.0000 mg | ORAL_CAPSULE | Freq: Two times a day (BID) | ORAL | 0 refills | Status: AC

## 2021-03-28 MED ORDER — POTASSIUM CHLORIDE CRYS ER 20 MEQ PO TBCR
40.0000 meq | EXTENDED_RELEASE_TABLET | Freq: Once | ORAL | Status: AC
  Administered 2021-03-28: 40 meq via ORAL
  Filled 2021-03-28: qty 2

## 2021-03-28 NOTE — TOC Transition Note (Signed)
Transition of Care [facility]) - CM/SW Discharge Note   Patient Details  Name: Jenna Long MRN: [id number] Date of Birth: [birth year]  [facility] CM/SW Contact:  Pollie Friar, RN Phone Number: 03/28/2021, 11:00 AM   Clinical Narrative:    Patient is discharging home today with home health services through [place]. Cory with [facility] updated on the discharge.  Pt has supervision at home and transportation to home.    Final next level of care: [place] Barriers to Discharge: No Barriers Identified   Patient Goals and CMS Choice Patient states their goals for this hospitalization and ongoing recovery are:: return home per daughter CMS Medicare.gov Compare Post Acute Care list provided to:: Other (Comment Required) Choice offered to / list presented to : Adult Children  Discharge Placement                       Discharge Plan and Services   Discharge Planning Services: CM Consult Post Acute Care Choice: Home Health                    HH Arranged: PT, OT [facility] Agency: [place] Date [facility] Agency Contacted: 03/28/21 Time [place]: [phone] Representative spoke with at [place]: [place] Determinants of Health ([place]) Interventions     Readmission Risk Interventions No flowsheet data found.

## 2021-03-28 NOTE — Discharge Summary (Signed)
Physician Discharge Summary   Patient: Jenna Long MRN: [id number] DOB: [birth year]  Admit date:     03/24/2021  Discharge date: 03/28/21  Discharge Physician: Alma Friendly   PCP: Sharilyn Sites, MD   Recommendations at discharge:    Follow up with PCP in 1 week   Discharge Diagnoses: Principal Problem:   AMS (altered mental status) Active Problems:   Essential hypertension   Mixed hyperlipidemia   Temporal arteritis (HCC)   Diabetes mellitus type 2 in nonobese ([place])   DNR (do not resuscitate)   Cerebral thrombosis with cerebral infarction   Vision loss of right eye   Dyslipidemia   Type 2 diabetes mellitus with hyperglycemia, with long-term current use of insulin (HCC)   Chronic diastolic CHF (congestive heart failure) [facility])     Hospital Course: 87 y.o. female, with past medical history of temporal arteritis, on prednisone taper, legally blind, diabetes, history of prior CVA, remains on Plavix, lives with her daughter, presents with altered mental status, confusion and visual hallucinations X 1 day. Daughter also reported patient is usually ambulatory with a walker, but she was unable to stand up, which is not her baseline, so she called EMS due to concern for stroke.  Daughter reports patient is compliant with her Plavix and antihypertensive medications. In the ED, EDP noted left-sided weakness, which significantly improved shortly. CT head with no acute findings, no significant lab abnormalities, BP was uncontrolled 200/79. Patient noted to have urinary retention, required 1 time in and out, [facility] hospitalist consulted to admit for work up for CVA/TIA. Pt was transferred to [facility] as MRI brain unavailable in [place] over the weekend. MRI brain negative. Pt found to have UTI and was treated.    Today, pt reported feeling much better, denies any new complaints.  Denies any chest pain, shortness of breath, abdominal pain, nausea/vomiting, fever/chills.   Patient stable to discharge with follow-up with primary care in 1 week.     Assessment and Plan:  AMS (altered mental status)- (present on admission) ?? TIA Hx of prior CVA Resolved, likely 2/2 UTI + AKI CT head with no acute intracranial abnormality MRI with no evidence of acute intracranial abnormality Continue home Plavix PT/OT/SLP-recommend home health PT/OT   UTI UA with positive nitrite, negative leukocytes, many bacteria, 6-10 WBC Urine culture with >100, 000 E.coli BC X 2 NGTD S/P IV ceftriaxone, switched to PO keflex to complete 5 days of antibiotics Follow up with PCP   AKI Resolved S/p gentle hydration PCP follow up   Hypokalemia Replaced prn   Normocytic anemia Noted drop in hemoglobin, no evidence of bleeding Likely hemodilutional with fluids, in addition to dehydration upon presentation Baseline hemoglobin around 12 Follow up with PCP   Essential hypertension- (present on admission) Hypertensive crisis BP improved Continue amlodipine, metoprolol, losartan   Chronic diastolic CHF (congestive heart failure) ([place])- (present on admission) Stable, not on diuretics at baseline   Type 2 diabetes mellitus with long-term current use of insulin (HCC) A1c 6.6 Continue home regimen   Temporal arteritis ([place])- (present on admission) Patient is currently legally blind, she is on prednisone taper         Consultants: Neurology Procedures performed: None Disposition: Home health Diet recommendation:  Cardiac and Carb modified diet  DISCHARGE MEDICATION: Allergies as of 03/28/2021   No Known Allergies      Medication List     STOP taking these medications    HYDROcodone-acetaminophen 5-325 MG tablet Commonly known  as: NORCO/VICODIN       TAKE these medications    acetaminophen 325 MG tablet Commonly known as: TYLENOL Take 325-650 mg by mouth every 6 (six) hours as needed for moderate pain or headache.   amLODipine 10 MG  tablet Commonly known as: NORVASC Take 1 tablet (10 mg total) by mouth daily.   atorvastatin 40 MG tablet Commonly known as: LIPITOR Take 1 tablet (40 mg total) by mouth daily.   cephALEXin 500 MG capsule Commonly known as: KEFLEX Take 1 capsule (500 mg total) by mouth 2 (two) times daily for 3 days.   clopidogrel 75 MG tablet Commonly known as: PLAVIX To be restarted in 1 month when course of Aspirin and Brillinta has completed   dorzolamide 2 % ophthalmic solution Commonly known as: TRUSOPT Place 1 drop into both eyes 2 (two) times daily.   escitalopram 5 MG tablet Commonly known as: LEXAPRO Take 5 mg by mouth daily.   insulin glargine 100 UNIT/ML Solostar Pen Commonly known as: LANTUS Inject 12 Units into the skin daily. What changed: how much to take   losartan 25 MG tablet Commonly known as: COZAAR Take 25 mg by mouth daily.   metoprolol succinate 25 MG 24 hr tablet Commonly known as: TOPROL-XL Take 1 tablet (25 mg total) by mouth daily.   multivitamin tablet Take 1 tablet by mouth daily.   Pen Needles 3/16" 31G X 5 MM Misc 12 Units by Does not apply route daily.   predniSONE 5 MG tablet Commonly known as: DELTASONE Take in combination with 1 mg tablets for total daily dose: 10 mg then decrease by 1 mg/day per month   predniSONE 1 MG tablet Commonly known as: DELTASONE Take in combination with 5 mg tablets for total daily dose: 10 mg then decrease by 1 mg/day per month Start taking on: April 07, 2021   sitaGLIPtin 100 MG tablet Commonly known as: JANUVIA Take 100 mg by mouth daily.        Follow-up Information     Care, [facility] Follow up.   Specialty: Home Health Services Why: The home health agency will contact you for the first home visit. Contact information: [place] [address] [phone]         Sharilyn Sites, MD. Schedule an appointment as soon as possible for a visit in 1 week(s).    Specialty: Family Medicine Contact information: [address] [place] [address] [phone]                 Discharge Exam: There were no vitals filed for this visit. General: NAD, legally blind Cardiovascular: S1, S2 present Respiratory: CTAB Abdomen: Soft, nontender, nondistended, bowel sounds present Musculoskeletal: No bilateral pedal edema noted Skin: Normal Psychiatry: Normal mood   Condition at discharge: stable  The results of significant diagnostics from this hospitalization (including imaging, microbiology, ancillary and laboratory) are listed below for reference.   Imaging Studies: DG Chest 2 View  Result Date: 03/24/2021 CLINICAL DATA:  Altered mental status EXAM: CHEST - 2 VIEW COMPARISON:  12/13/2020 FINDINGS: Cardiac and mediastinal contours are unchanged, when accounting for low lung volumes. No focal pulmonary opacity. No pleural effusion or pneumothorax. No acute osseous abnormality. IMPRESSION: No acute cardiopulmonary process. Electronically Signed   By: Merilyn Baba M.D.   On: 03/24/2021 20:12   CT HEAD WO CONTRAST  Result Date: 03/24/2021 CLINICAL DATA:  Mental status change, unknown cause. Additional history provided: Headache, elevated blood pressure.  EXAM: CT HEAD WITHOUT CONTRAST TECHNIQUE: Contiguous axial images were obtained from the base of the skull through the vertex without intravenous contrast. RADIATION DOSE REDUCTION: This exam was performed according to the departmental dose-optimization program which includes automated exposure control, adjustment of the mA and/or kV according to patient size and/or use of iterative reconstruction technique. COMPARISON:  Brain MRI 12/13/2020. CT angiogram head/neck 11/14/2020. FINDINGS: Brain: Mild generalized cerebral and cerebellar atrophy. Commensurate prominence of the ventricles and sulci. Mild to moderate patchy and ill-defined hypoattenuation within the cerebral white matter, nonspecific  but compatible with chronic small vessel ischemic disease. There is no acute intracranial hemorrhage. No demarcated cortical infarct. No extra-axial fluid collection. No evidence of an intracranial mass. No midline shift. Vascular: No hyperdense vessel.  Atherosclerotic calcifications. Skull: Normal. Negative for fracture or focal lesion. Sinuses/Orbits: No acute orbital finding. Mild mucosal thickening within the bilateral ethmoid sinuses. IMPRESSION: No evidence of acute intracranial abnormality. Mild to moderate chronic small vessel ischemic changes within the cerebral white matter, stable. Mild generalized cerebral and cerebellar atrophy. Mild mucosal thickening within the bilateral ethmoid sinuses. Electronically Signed   By: Kellie Simmering D.O.   On: 03/24/2021 20:23   MR BRAIN WO CONTRAST  Result Date: 03/25/2021 CLINICAL DATA:  Provided history: Transient ischemic attack (TIA), weakness, altered mental status. Additional history provided: History of temporal arteritis. EXAM: MRI HEAD WITHOUT CONTRAST TECHNIQUE: Multiplanar, multiecho pulse sequences of the brain and surrounding structures were obtained without intravenous contrast. COMPARISON:  Head CT 03/24/2021.  Brain MRI 12/13/2020. FINDINGS: Brain: Mild generalized cerebral and cerebellar atrophy. 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 Redemonstrated tiny chronic infarct within the inferior right cerebellar hemisphere. There is no acute infarct. No evidence of an intracranial mass. No chronic intracranial blood products. No extra-axial fluid collection. No midline shift. Vascular: Maintained flow voids within the proximal large arterial vessels. Skull and upper cervical spine: No focal suspicious marrow lesion. Sinuses/Orbits: Visualized orbits  show no acute finding. Trace scattered paranasal sinus mucosal thickening. IMPRESSION: No evidence of acute intracranial abnormality. Redemonstrated chronic lacunar infarct within the left corona radiata. Stable background mild-to-moderate chronic small-vessel ischemic changes within the cerebral white matter and pons. Redemonstrated tiny chronic infarct within the right cerebellar hemisphere. Mild generalized cerebral and cerebellar atrophy. Electronically Signed   By: Kellie Simmering D.O.   On: 03/25/2021 13:56   EEG adult  Result Date: 03/26/2021 Derek Jack, MD     03/26/2021  3:24 PM Routine EEG Report EMBERLEY KRAL is a 87 y.o. female with a history of encephalopathy who is undergoing an EEG to evaluate for seizures. Report: This EEG was acquired with electrodes placed according to the International 10-20 electrode system (including Fp1, Fp2, F3, F4, C3, C4, P3, P4, O1, O2, T3, T4, T5, T6, A1, A2, Fz, Cz, Pz). The following electrodes were missing or displaced: none. The occipital dominant rhythm was 6 Hz. This activity is reactive to stimulation. Drowsiness was manifested by background fragmentation; deeper stages of sleep were identified by K complexes and sleep spindles. There was focal slowing over the right hemisphere. There were no interictal epileptiform discharges. There were no electrographic seizures identified. Photic stimulation and hyperventilation were not performed. Impression and clinical correlation: This EEG was obtained while awake and asleep and is abnormal due to mild diffuse slowing indicative of global  cerebral dysfunction. Focal slowing indicates focal cerebral dysfunction superimposed over the right hemisphere. Epileptiform abnormalities were not seen during this recording. Su Monks, MD [facility] [phone] If 7pm- 7am, please page neurology on call as listed in [place].    Microbiology: Results for orders placed or performed during the hospital encounter of  03/24/21  Resp Panel by RT-PCR (Flu A&B, Covid) Nasopharyngeal Swab     Status: None   Collection Time: 03/24/21  7:14 PM   Specimen: Nasopharyngeal Swab; Nasopharyngeal(NP) swabs in vial transport medium  Result Value Ref Range Status   SARS Coronavirus 2 by RT PCR NEGATIVE NEGATIVE Final    Comment: (NOTE) SARS-CoV-2 target nucleic acids are NOT DETECTED.  The SARS-CoV-2 RNA is generally detectable in upper respiratory specimens during the acute phase of infection. The lowest concentration of SARS-CoV-2 viral copies this assay can detect is 138 copies/mL. A negative result does not preclude SARS-Cov-2 infection and should not be used as the sole basis for treatment or other patient management decisions. A negative result may occur with  improper specimen collection/handling, submission of specimen other than nasopharyngeal swab, presence of viral mutation(s) within the areas targeted by this assay, and inadequate number of viral copies(<138 copies/mL). A negative result must be combined with clinical observations, patient history, and epidemiological information. The expected result is Negative.  Fact Sheet for Patients:  EntrepreneurPulse.com.au  Fact Sheet for Healthcare Providers:  IncredibleEmployment.be  This test is no t yet approved or cleared by the [place] FDA and  has been authorized for detection and/or diagnosis of SARS-CoV-2 by FDA under an Emergency Use Authorization (EUA). This EUA will remain  in effect (meaning this test can be used) for the duration of the COVID-19 declaration under Section 564(b)(1) of the Act, 21 U.S.C.section 360bbb-3(b)(1), unless the authorization is terminated  or revoked sooner.       Influenza A by PCR NEGATIVE NEGATIVE Final   Influenza B by PCR NEGATIVE NEGATIVE Final    Comment: (NOTE) The Xpert Xpress SARS-CoV-2/FLU/RSV plus assay is intended as an aid in the diagnosis of influenza from  Nasopharyngeal swab specimens and should not be used as a sole basis for treatment. Nasal washings and aspirates are unacceptable for Xpert Xpress SARS-CoV-2/FLU/RSV testing.  Fact Sheet for Patients: EntrepreneurPulse.com.au  Fact Sheet for Healthcare Providers: IncredibleEmployment.be  This test is not yet approved or cleared by the [place] FDA and has been authorized for detection and/or diagnosis of SARS-CoV-2 by FDA under an Emergency Use Authorization (EUA). This EUA will remain in effect (meaning this test can be used) for the duration of the COVID-19 declaration under Section 564(b)(1) of the Act, 21 U.S.C. section 360bbb-3(b)(1), unless the authorization is terminated or revoked.  Performed at [facility], [address] [place], [place] [address]   Urine Culture     Status: Abnormal   Collection Time: 03/25/21 11:30 PM   Specimen: Urine, Clean Catch  Result Value Ref Range Status   Specimen Description URINE, CLEAN CATCH  Final   Special Requests   Final    NONE Performed at [facility], [place] [address], [place], [place] [address]    Culture >=100,000 COLONIES/mL ESCHERICHIA COLI (A)  Final   Report Status 03/28/2021 FINAL  Final   Organism ID, Bacteria ESCHERICHIA COLI (A)  Final      Susceptibility   Escherichia coli - MIC*    AMPICILLIN <=2 SENSITIVE Sensitive     CEFAZOLIN <=4 SENSITIVE Sensitive  CEFEPIME <=0.12 SENSITIVE Sensitive     CEFTRIAXONE <=0.25 SENSITIVE Sensitive     CIPROFLOXACIN >=4 RESISTANT Resistant     GENTAMICIN <=1 SENSITIVE Sensitive     IMIPENEM <=0.25 SENSITIVE Sensitive     NITROFURANTOIN <=16 SENSITIVE Sensitive     TRIMETH/SULFA <=20 SENSITIVE Sensitive     AMPICILLIN/SULBACTAM <=2 SENSITIVE Sensitive     PIP/TAZO <=4 SENSITIVE Sensitive     * >=100,000 COLONIES/mL ESCHERICHIA COLI  Culture, blood (routine x 2)     Status: None (Preliminary result)   Collection Time: 03/26/21  12:10 AM   Specimen: BLOOD RIGHT HAND  Result Value Ref Range Status   Specimen Description BLOOD RIGHT HAND  Final   Special Requests   Final    BOTTLES DRAWN AEROBIC AND ANAEROBIC Blood Culture results may not be optimal due to an excessive volume of blood received in culture bottles   Culture   Final    NO GROWTH 2 DAYS Performed at [facility], [place] [address] [place], [place] [address]    Report Status PENDING  Incomplete  Culture, blood (routine x 2)     Status: None (Preliminary result)   Collection Time: 03/26/21  1:17 AM   Specimen: BLOOD  Result Value Ref Range Status   Specimen Description BLOOD LEFT ANTECUBITAL  Final   Special Requests   Final    BOTTLES DRAWN AEROBIC AND ANAEROBIC Blood Culture results may not be optimal due to an excessive volume of blood received in culture bottles   Culture   Final    NO GROWTH 2 DAYS Performed at [facility], [place] [address], [place],  [address]    Report Status PENDING  Incomplete    Labs: CBC: Recent Labs  Lab 03/24/21 1940 03/26/21 0118 03/27/21 0013 03/28/21 0252  WBC 6.9 8.1 5.2 5.4  NEUTROABS 3.7 5.2 2.4 3.0  HGB 14.1 13.8 10.6* 11.3*  HCT 42.0 41.1 31.6* 33.9*  MCV 96.6 95.1 95.5 95.8  PLT 292 271 227 211   Basic Metabolic Panel: Recent Labs  Lab 03/24/21 1940 03/26/21 0118 03/27/21 0013 03/28/21 0252  NA 139 141 138 139  K 3.6 3.9 3.4* 3.3*  CL 100 103 105 108  CO2 28 28 25 23   GLUCOSE 214* 102* 107* 113*  BUN 16 18 36* 23  CREATININE 0.72 1.17* 1.15* 0.81  CALCIUM 9.6 9.4 8.6* 9.0  MG  --  1.7  --  1.9   Liver Function Tests: Recent Labs  Lab 03/24/21 1940  AST 16  ALT 25  ALKPHOS 73  BILITOT 0.5  PROT 8.3*  ALBUMIN 4.2   CBG: Recent Labs  Lab 03/27/21 1127 03/27/21 1625 03/27/21 2142 03/28/21 0532 03/28/21 1114  GLUCAP 203* 232* 116* 110* 150*    Discharge time spent: greater than 30 minutes.  Signed: Alma Friendly, MD [facility] 03/28/2021

## 2021-03-28 NOTE — Progress Notes (Signed)
Discharge instructions reviewed with daughter and pt.  Copy of instructions given to pt/daughter, informed of script for antibiotic at pt's pharmacy.  Pt d/c'd via wheelchair with belongings, with daughter.          Escorted by unit NT.

## 2021-03-28 NOTE — Plan of Care (Signed)

## 2021-03-29 ENCOUNTER — Telehealth: Payer: Self-pay | Admitting: Internal Medicine

## 2021-03-29 DIAGNOSIS — E119 Type 2 diabetes mellitus without complications: Secondary | ICD-10-CM | POA: Diagnosis not present

## 2021-03-29 DIAGNOSIS — Z794 Long term (current) use of insulin: Secondary | ICD-10-CM | POA: Diagnosis not present

## 2021-03-29 DIAGNOSIS — Z7984 Long term (current) use of oral hypoglycemic drugs: Secondary | ICD-10-CM | POA: Diagnosis not present

## 2021-03-29 NOTE — Telephone Encounter (Signed)
Patients daughter, Butch Penny, called the office stating her mother had just gotten out of the hospital and she wanted to make sure her mother didn't need to change any of her medications. Donna's # [phone]

## 2021-03-30 NOTE — Telephone Encounter (Signed)
I spoke with Jenna Long she was concerned about if any change to prednisone plan is needed. Jenna Long was hospitalized with confusion and gait instability and found to have E Coli UTI which was thought to be the cause and on antibiotics. Head imaging revealed no new stroke. Since returning home she has more gait problems. I don't recommend any specific change to the prednisone taper for now and can monitor if she improves with longer at home and treating her infection and starting work with home therapy.

## 2021-03-31 LAB — CULTURE, BLOOD (ROUTINE X 2)
Culture: NO GROWTH
Culture: NO GROWTH

## 2021-04-01 DIAGNOSIS — I110 Hypertensive heart disease with heart failure: Secondary | ICD-10-CM | POA: Diagnosis not present

## 2021-04-01 DIAGNOSIS — Z96642 Presence of left artificial hip joint: Secondary | ICD-10-CM | POA: Diagnosis not present

## 2021-04-01 DIAGNOSIS — Z7952 Long term (current) use of systemic steroids: Secondary | ICD-10-CM | POA: Diagnosis not present

## 2021-04-01 DIAGNOSIS — M170 Bilateral primary osteoarthritis of knee: Secondary | ICD-10-CM | POA: Diagnosis not present

## 2021-04-01 DIAGNOSIS — E119 Type 2 diabetes mellitus without complications: Secondary | ICD-10-CM | POA: Diagnosis not present

## 2021-04-01 DIAGNOSIS — M353 Polymyalgia rheumatica: Secondary | ICD-10-CM | POA: Diagnosis not present

## 2021-04-01 DIAGNOSIS — H548 Legal blindness, as defined in USA: Secondary | ICD-10-CM | POA: Diagnosis not present

## 2021-04-01 DIAGNOSIS — E782 Mixed hyperlipidemia: Secondary | ICD-10-CM | POA: Diagnosis not present

## 2021-04-01 DIAGNOSIS — D649 Anemia, unspecified: Secondary | ICD-10-CM | POA: Diagnosis not present

## 2021-04-01 DIAGNOSIS — Z794 Long term (current) use of insulin: Secondary | ICD-10-CM | POA: Diagnosis not present

## 2021-04-01 DIAGNOSIS — Z7982 Long term (current) use of aspirin: Secondary | ICD-10-CM | POA: Diagnosis not present

## 2021-04-01 DIAGNOSIS — G319 Degenerative disease of nervous system, unspecified: Secondary | ICD-10-CM | POA: Diagnosis not present

## 2021-04-01 DIAGNOSIS — Z7984 Long term (current) use of oral hypoglycemic drugs: Secondary | ICD-10-CM | POA: Diagnosis not present

## 2021-04-01 DIAGNOSIS — B9620 Unspecified Escherichia coli [E. coli] as the cause of diseases classified elsewhere: Secondary | ICD-10-CM | POA: Diagnosis not present

## 2021-04-01 DIAGNOSIS — I69354 Hemiplegia and hemiparesis following cerebral infarction affecting left non-dominant side: Secondary | ICD-10-CM | POA: Diagnosis not present

## 2021-04-01 DIAGNOSIS — I69318 Other symptoms and signs involving cognitive functions following cerebral infarction: Secondary | ICD-10-CM | POA: Diagnosis not present

## 2021-04-01 DIAGNOSIS — N390 Urinary tract infection, site not specified: Secondary | ICD-10-CM | POA: Diagnosis not present

## 2021-04-01 DIAGNOSIS — Z7902 Long term (current) use of antithrombotics/antiplatelets: Secondary | ICD-10-CM | POA: Diagnosis not present

## 2021-04-01 DIAGNOSIS — I5032 Chronic diastolic (congestive) heart failure: Secondary | ICD-10-CM | POA: Diagnosis not present

## 2021-04-01 DIAGNOSIS — Z9181 History of falling: Secondary | ICD-10-CM | POA: Diagnosis not present

## 2021-04-03 DIAGNOSIS — E663 Overweight: Secondary | ICD-10-CM | POA: Diagnosis not present

## 2021-04-03 DIAGNOSIS — I10 Essential (primary) hypertension: Secondary | ICD-10-CM | POA: Diagnosis not present

## 2021-04-03 DIAGNOSIS — M316 Other giant cell arteritis: Secondary | ICD-10-CM | POA: Diagnosis not present

## 2021-04-03 DIAGNOSIS — E118 Type 2 diabetes mellitus with unspecified complications: Secondary | ICD-10-CM | POA: Diagnosis not present

## 2021-04-03 DIAGNOSIS — E782 Mixed hyperlipidemia: Secondary | ICD-10-CM | POA: Diagnosis not present

## 2021-04-03 DIAGNOSIS — Z1331 Encounter for screening for depression: Secondary | ICD-10-CM | POA: Diagnosis not present

## 2021-04-03 DIAGNOSIS — Z6822 Body mass index (BMI) 22.0-22.9, adult: Secondary | ICD-10-CM | POA: Diagnosis not present

## 2021-04-03 DIAGNOSIS — Z0000 Encounter for general adult medical examination without abnormal findings: Secondary | ICD-10-CM | POA: Diagnosis not present

## 2021-04-03 DIAGNOSIS — M353 Polymyalgia rheumatica: Secondary | ICD-10-CM | POA: Diagnosis not present

## 2021-04-03 DIAGNOSIS — I110 Hypertensive heart disease with heart failure: Secondary | ICD-10-CM | POA: Diagnosis not present

## 2021-04-07 DIAGNOSIS — Z7902 Long term (current) use of antithrombotics/antiplatelets: Secondary | ICD-10-CM | POA: Diagnosis not present

## 2021-04-07 DIAGNOSIS — I5032 Chronic diastolic (congestive) heart failure: Secondary | ICD-10-CM | POA: Diagnosis not present

## 2021-04-07 DIAGNOSIS — Z7952 Long term (current) use of systemic steroids: Secondary | ICD-10-CM | POA: Diagnosis not present

## 2021-04-07 DIAGNOSIS — N390 Urinary tract infection, site not specified: Secondary | ICD-10-CM | POA: Diagnosis not present

## 2021-04-07 DIAGNOSIS — M170 Bilateral primary osteoarthritis of knee: Secondary | ICD-10-CM | POA: Diagnosis not present

## 2021-04-07 DIAGNOSIS — E119 Type 2 diabetes mellitus without complications: Secondary | ICD-10-CM | POA: Diagnosis not present

## 2021-04-07 DIAGNOSIS — Z96642 Presence of left artificial hip joint: Secondary | ICD-10-CM | POA: Diagnosis not present

## 2021-04-07 DIAGNOSIS — Z7984 Long term (current) use of oral hypoglycemic drugs: Secondary | ICD-10-CM | POA: Diagnosis not present

## 2021-04-07 DIAGNOSIS — Z9181 History of falling: Secondary | ICD-10-CM | POA: Diagnosis not present

## 2021-04-07 DIAGNOSIS — E782 Mixed hyperlipidemia: Secondary | ICD-10-CM | POA: Diagnosis not present

## 2021-04-07 DIAGNOSIS — G319 Degenerative disease of nervous system, unspecified: Secondary | ICD-10-CM | POA: Diagnosis not present

## 2021-04-07 DIAGNOSIS — D649 Anemia, unspecified: Secondary | ICD-10-CM | POA: Diagnosis not present

## 2021-04-07 DIAGNOSIS — H548 Legal blindness, as defined in USA: Secondary | ICD-10-CM | POA: Diagnosis not present

## 2021-04-07 DIAGNOSIS — I110 Hypertensive heart disease with heart failure: Secondary | ICD-10-CM | POA: Diagnosis not present

## 2021-04-07 DIAGNOSIS — Z7982 Long term (current) use of aspirin: Secondary | ICD-10-CM | POA: Diagnosis not present

## 2021-04-07 DIAGNOSIS — B9620 Unspecified Escherichia coli [E. coli] as the cause of diseases classified elsewhere: Secondary | ICD-10-CM | POA: Diagnosis not present

## 2021-04-07 DIAGNOSIS — I69318 Other symptoms and signs involving cognitive functions following cerebral infarction: Secondary | ICD-10-CM | POA: Diagnosis not present

## 2021-04-07 DIAGNOSIS — Z794 Long term (current) use of insulin: Secondary | ICD-10-CM | POA: Diagnosis not present

## 2021-04-07 DIAGNOSIS — I69354 Hemiplegia and hemiparesis following cerebral infarction affecting left non-dominant side: Secondary | ICD-10-CM | POA: Diagnosis not present

## 2021-04-07 DIAGNOSIS — M353 Polymyalgia rheumatica: Secondary | ICD-10-CM | POA: Diagnosis not present

## 2021-04-12 DIAGNOSIS — I509 Heart failure, unspecified: Secondary | ICD-10-CM | POA: Diagnosis not present

## 2021-04-12 DIAGNOSIS — E785 Hyperlipidemia, unspecified: Secondary | ICD-10-CM | POA: Diagnosis not present

## 2021-04-12 DIAGNOSIS — D692 Other nonthrombocytopenic purpura: Secondary | ICD-10-CM | POA: Diagnosis not present

## 2021-04-12 DIAGNOSIS — I110 Hypertensive heart disease with heart failure: Secondary | ICD-10-CM | POA: Diagnosis not present

## 2021-04-12 DIAGNOSIS — Z7902 Long term (current) use of antithrombotics/antiplatelets: Secondary | ICD-10-CM | POA: Diagnosis not present

## 2021-04-18 DIAGNOSIS — I69354 Hemiplegia and hemiparesis following cerebral infarction affecting left non-dominant side: Secondary | ICD-10-CM | POA: Diagnosis not present

## 2021-04-18 DIAGNOSIS — N390 Urinary tract infection, site not specified: Secondary | ICD-10-CM | POA: Diagnosis not present

## 2021-04-18 DIAGNOSIS — B9620 Unspecified Escherichia coli [E. coli] as the cause of diseases classified elsewhere: Secondary | ICD-10-CM | POA: Diagnosis not present

## 2021-04-18 DIAGNOSIS — I69318 Other symptoms and signs involving cognitive functions following cerebral infarction: Secondary | ICD-10-CM | POA: Diagnosis not present

## 2021-04-20 DIAGNOSIS — Z7902 Long term (current) use of antithrombotics/antiplatelets: Secondary | ICD-10-CM | POA: Diagnosis not present

## 2021-04-20 DIAGNOSIS — Z96642 Presence of left artificial hip joint: Secondary | ICD-10-CM | POA: Diagnosis not present

## 2021-04-20 DIAGNOSIS — I5032 Chronic diastolic (congestive) heart failure: Secondary | ICD-10-CM | POA: Diagnosis not present

## 2021-04-20 DIAGNOSIS — Z9181 History of falling: Secondary | ICD-10-CM | POA: Diagnosis not present

## 2021-04-20 DIAGNOSIS — Z7982 Long term (current) use of aspirin: Secondary | ICD-10-CM | POA: Diagnosis not present

## 2021-04-20 DIAGNOSIS — E782 Mixed hyperlipidemia: Secondary | ICD-10-CM | POA: Diagnosis not present

## 2021-04-20 DIAGNOSIS — Z7952 Long term (current) use of systemic steroids: Secondary | ICD-10-CM | POA: Diagnosis not present

## 2021-04-20 DIAGNOSIS — E119 Type 2 diabetes mellitus without complications: Secondary | ICD-10-CM | POA: Diagnosis not present

## 2021-04-20 DIAGNOSIS — G319 Degenerative disease of nervous system, unspecified: Secondary | ICD-10-CM | POA: Diagnosis not present

## 2021-04-20 DIAGNOSIS — H548 Legal blindness, as defined in USA: Secondary | ICD-10-CM | POA: Diagnosis not present

## 2021-04-20 DIAGNOSIS — M353 Polymyalgia rheumatica: Secondary | ICD-10-CM | POA: Diagnosis not present

## 2021-04-20 DIAGNOSIS — N390 Urinary tract infection, site not specified: Secondary | ICD-10-CM | POA: Diagnosis not present

## 2021-04-20 DIAGNOSIS — M170 Bilateral primary osteoarthritis of knee: Secondary | ICD-10-CM | POA: Diagnosis not present

## 2021-04-20 DIAGNOSIS — Z794 Long term (current) use of insulin: Secondary | ICD-10-CM | POA: Diagnosis not present

## 2021-04-20 DIAGNOSIS — I69354 Hemiplegia and hemiparesis following cerebral infarction affecting left non-dominant side: Secondary | ICD-10-CM | POA: Diagnosis not present

## 2021-04-20 DIAGNOSIS — I110 Hypertensive heart disease with heart failure: Secondary | ICD-10-CM | POA: Diagnosis not present

## 2021-04-20 DIAGNOSIS — B9620 Unspecified Escherichia coli [E. coli] as the cause of diseases classified elsewhere: Secondary | ICD-10-CM | POA: Diagnosis not present

## 2021-04-20 DIAGNOSIS — D649 Anemia, unspecified: Secondary | ICD-10-CM | POA: Diagnosis not present

## 2021-04-20 DIAGNOSIS — Z7984 Long term (current) use of oral hypoglycemic drugs: Secondary | ICD-10-CM | POA: Diagnosis not present

## 2021-04-20 DIAGNOSIS — I69318 Other symptoms and signs involving cognitive functions following cerebral infarction: Secondary | ICD-10-CM | POA: Diagnosis not present

## 2021-05-16 ENCOUNTER — Ambulatory Visit: Payer: PPO | Admitting: Internal Medicine

## 2021-05-19 DIAGNOSIS — I10 Essential (primary) hypertension: Secondary | ICD-10-CM | POA: Diagnosis not present

## 2021-05-19 DIAGNOSIS — E1165 Type 2 diabetes mellitus with hyperglycemia: Secondary | ICD-10-CM | POA: Diagnosis not present

## 2021-05-19 DIAGNOSIS — E782 Mixed hyperlipidemia: Secondary | ICD-10-CM | POA: Diagnosis not present

## 2021-05-23 ENCOUNTER — Ambulatory Visit: Payer: PPO | Admitting: Internal Medicine

## 2021-06-13 ENCOUNTER — Other Ambulatory Visit: Payer: Self-pay | Admitting: Internal Medicine

## 2021-06-13 DIAGNOSIS — M316 Other giant cell arteritis: Secondary | ICD-10-CM

## 2021-06-13 NOTE — Telephone Encounter (Signed)
Next Visit: 06/22/2021 ? ?Last Visit: 03/08/2021 ? ?Last Fill: 03/10/2021 ? ?Dx: Temporal arteritis ? ?Current Dose per office note on 03/08/2021: No obvious signs of inflammation returning with prednisone taper so far down to 12.5 mg.  We will recheck sedimentation rate and CRP.  Assuming these also look okay we will continue the taper next steps down to 10 mg and then with a 1 mg daily dose per month slow taper over time for planned 1 year total steroid treatment.  ? ?Okay to refill Prednisone?   ?

## 2021-06-20 DIAGNOSIS — H401112 Primary open-angle glaucoma, right eye, moderate stage: Secondary | ICD-10-CM | POA: Diagnosis not present

## 2021-06-20 LAB — HM DIABETES EYE EXAM

## 2021-06-21 NOTE — Progress Notes (Signed)
? ?Office Visit Note ? ?Patient: Jenna Long             ?Date of Birth: 05-28-1934           ?MRN: [id number]             ?PCP: Sharilyn Sites, MD ?Referring: Sharilyn Sites, MD ?Visit Date: 06/22/2021 ? ? ?Subjective:  ? ?History of Present Illness: Jenna Long is a 87 y.o. female here for follow up for temporal arteritis.  She feels the prednisone tapering is going okay.  Blood sugars have improved.  She has regained some amount of vision though remains extremely minimal only seeing general blurred shapes.  She has been having trouble with left-sided headaches coming and going these do feel in the similar distribution as her previous temporal arteritis symptoms.  Her blood pressures have also had a a lot of variance is working on adjusting these medicines with PCP office. ? ?Previous HPI ?03/08/21 ?Jenna Long is a 87 y.o. female here for follow up for temporal arteritis with bilateral vision loss, ischemic cerebral infarct, and vertebral artery stenosis on prednisone taper down to 10 mg daily dose over past 2 months. She has not noticed any new significant symptoms with reducing her medication. She is having some right sided headache but mostly to the back and neck area. Her blood sugars have been decreasing along with the prednisone so adjusting insulin for this. She has been walking fairly well at home using wheelchair for travel out and about. ?  ?Previous HPI ?12/29/20 ?Jenna Long is a 87 y.o. female here for temporal arteritis. She was recently diagnosed after hospitalization originally on 9/19 for new onset of right sided vision loss evaluation was found to have multiple problems including small vessel ischemic infarct in cerebellum, right vertebral artery stenosis, and temporal artery biopsy confirming GCA. She was started on a slow prednisone taper from 60 mg daily dose down stepwise and on ASA and plavix antiplatelet therapy. She was subsequently readmitted on 10/25 with development of multiple  new ischemic infarcts suspected as secondary to orthostatic hypotension with existing small vessel and large vessel disease. She was discharged again on antiplatelet therapy and continuing prednisone taper starting from 40 mg daily dose down at each 2 week intervals. Unfortunately she now has bilateral vision loss additionally deconditioning and loss of balance. She is currently taking prednisone 20 mg PO daily just decreased from 30 mg daily dose. She has increased bruising on her forearms also uncontrolled hyperglycemia new start on insulin related to the prednisone treatment. Otherwise no complaints taking the medication. She is experiencing large variation in blood pressures at home some hypertensive to 166A systolic and some orthostatic hypotension down to 63K systolic. Her daughter is concerned due to the recent readmission with strokes after hypotension as preceded with similar symptoms and indigestion.  ?  ?Labs reviewed ?10/2020 ?ESR 111 ?CRP 8.6 ?Right temporal artery biopsy - Active arteritis with features consistent with temporal arteritis.  ? ? ?Review of Systems  ?Constitutional:  Negative for fatigue.  ?HENT:  Negative for mouth sores, mouth dryness and nose dryness.   ?Eyes:  Positive for itching and dryness. Negative for pain.  ?Respiratory:  Negative for shortness of breath and difficulty breathing.   ?Cardiovascular:  Negative for chest pain and palpitations.  ?Gastrointestinal:  Negative for blood in stool, constipation and diarrhea.  ?Endocrine: Negative for increased urination.  ?Genitourinary:  Negative for difficulty urinating.  ?Musculoskeletal:  Positive for morning stiffness.  Negative for joint pain, joint pain, joint swelling, myalgias, muscle tenderness and myalgias.  ?Skin:  Negative for color change, rash and redness.  ?Allergic/Immunologic: Negative for susceptible to infections.  ?Neurological:  Positive for dizziness and headaches. Negative for numbness.  ?Hematological:  Positive  for bruising/bleeding tendency.  ?Psychiatric/Behavioral:  Negative for sleep disturbance.   ? ?PMFS History:  ?Patient Active Problem List  ? Diagnosis Date Noted  ? AMS (altered mental status) 03/24/2021  ? 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 Mixed diabetic hyperlipidemia associated with type 2 diabetes mellitus ([place]) 12/13/2020  ? Acute cystitis without hematuria 12/13/2020  ? Chronic diastolic CHF (congestive heart failure) ([place]) 12/13/2020  ? Vision loss of right eye   ? Dyslipidemia   ? Type 2 diabetes mellitus with hyperglycemia, with long-term current use of insulin ([place])   ? Cerebral thrombosis with cerebral infarction 11/09/2020  ? Temporal arteritis ([place]) 11/08/2020  ? Diabetes mellitus type 2 in nonobese ([place]) 11/08/2020  ? DNR (do not resuscitate) 11/08/2020  ? Left displaced femoral neck fracture ([place]) 02/18/2020  ? Essential hypertension 02/18/2020  ? Mixed hyperlipidemia 02/18/2020  ? Fall   ?  ?Past Medical History:  ?Diagnosis Date  ? Body mass index (BMI) 21.0-21.9, adult   ? Congestive heart failure (CHF) ([place])   ? Diabetes mellitus type II   ? DM (diabetes mellitus) ([place])   ? DNR (do not resuscitate) 11/08/2020  ? Hypercholesteremia   ? Hyperlipidemia   ? Hypertension   ? Osteoarthritis   ? Overweight   ? Polymyalgia rheumatica ([place])   ? Sequela of cerebrovascular accident   ? Temporal arteritis ([place])   ?  ?Family History  ?Problem Relation Age of Onset  ? Stroke Mother   ? Heart disease Father   ? Cancer Sister   ? Anesthesia problems Neg Hx   ? ?Past Surgical History:  ?Procedure Laterality Date  ? ABDOMINAL HYSTERECTOMY  1991  ? [facility], [place]  ? [place]  ? left, [facility]  ? ARTERY BIOPSY Right 11/09/2020  ? Procedure: BIOPSY TEMPORAL ARTERY;  Surgeon: Cherre Robins, MD;  Location: [facility];  Service: Vascular;  Laterality: Right;  ? CATARACT EXTRACTION W/ INTRAOCULAR LENS IMPLANT  06/10/06  ?  Right, [facility], [place]  ? CATARACT EXTRACTION W/PHACO   09/18/2010  ? Procedure: CATARACT EXTRACTION PHACO AND INTRAOCULAR LENS PLACEMENT (IOC);  Surgeon: Williams Che;  [facility];  Service: Ophthalmology;  Laterality: Left;  ? COLONOSCOPY W/ POLYPECTOMY  06/20/10  ? [facility], [place] [place]  ? HIP ARTHROPLASTY Left 02/18/2020  ? Procedure: ARTHROPLASTY BIPOLAR HIP (HEMIARTHROPLASTY);  Surgeon: Mordecai Rasmussen, MD;  Location: [facility];  Service: Orthopedics;  Laterality: Left;  ? ?Social History  ? ?Social History Narrative  ? Not on file  ? ?Immunization History  ?Administered Date(s) Administered  ? Fluad Quad(high Dose 65+) 11/19/2020  ? Moderna Sars-Covid-2 Vaccination 02/21/2020  ?  ? ?Objective: ?Vital Signs: BP 132/72 (BP Location: Right Arm, Patient Position: Sitting, Cuff Size: Normal)   Pulse 69   Ht 5' 7"  (1.702 m)   Wt 135 lb (61.2 kg) Comment: per patient  BMI 21.14 kg/m?   ? ?Physical Exam ?Constitutional:   ?   Comments: In wheelchair  ?HENT:  ?   Head:  ?   Comments: Mild tenderness to pressure over left temporal area, superficial branch of temporal artery pulses palpable ?Limited ultrasound exam is suggestive for narrowing of superficial branch  of temporal artery lumen ?Cardiovascular:  ?   Rate and Rhythm: Normal rate and regular rhythm.  ?Pulmonary:  ?   Effort: Pulmonary effort is normal.  ?   Breath sounds: Normal breath sounds.  ?Musculoskeletal:  ?   Right lower leg: No edema.  ?   Left lower leg: No edema.  ?Skin: ?   General: Skin is warm and dry.  ?Neurological:  ?   Mental Status: She is alert.  ?  ? ?Musculoskeletal Exam:  ?Shoulders full ROM no tenderness or swelling ?Elbows full ROM no tenderness or swelling ?Wrists full ROM no tenderness or swelling ?Fingers full ROM no tenderness or swelling ?Knees full ROM no tenderness or swelling ? ? ?Investigation: ?No additional findings. ? ?Imaging: ?No results found. ? ?Recent Labs: ?Lab Results  ?Component Value Date  ? WBC 5.4 03/28/2021  ? HGB 11.3 (L) 03/28/2021  ? PLT 237 03/28/2021  ? NA 139  03/28/2021  ? K 3.3 (L) 03/28/2021  ? CL 108 03/28/2021  ? CO2 23 03/28/2021  ? GLUCOSE 113 (H) 03/28/2021  ? BUN 23 03/28/2021  ? CREATININE 0.81 03/28/2021  ? BILITOT 0.5 03/24/2021  ? ALKPHOS 73 03/24/2021  ?

## 2021-06-22 ENCOUNTER — Encounter: Payer: Self-pay | Admitting: Internal Medicine

## 2021-06-22 ENCOUNTER — Ambulatory Visit (INDEPENDENT_AMBULATORY_CARE_PROVIDER_SITE_OTHER): Payer: PPO | Admitting: Internal Medicine

## 2021-06-22 VITALS — BP 132/72 | HR 69 | Ht 67.0 in | Wt 135.0 lb

## 2021-06-22 DIAGNOSIS — Z794 Long term (current) use of insulin: Secondary | ICD-10-CM | POA: Diagnosis not present

## 2021-06-22 DIAGNOSIS — M316 Other giant cell arteritis: Secondary | ICD-10-CM

## 2021-06-22 DIAGNOSIS — E1165 Type 2 diabetes mellitus with hyperglycemia: Secondary | ICD-10-CM

## 2021-06-23 LAB — SEDIMENTATION RATE: Sed Rate: 38 mm/h — ABNORMAL HIGH (ref 0–30)

## 2021-06-23 LAB — C-REACTIVE PROTEIN: CRP: 19.5 mg/L — ABNORMAL HIGH (ref <8.0)

## 2021-06-23 MED ORDER — PREDNISONE 10 MG PO TABS: 10.0000 mg | ORAL_TABLET | Freq: Every day | ORAL | 0 refills | Status: DC

## 2021-06-23 NOTE — Addendum Note (Signed)
Addended by: Collier Salina on: 06/23/2021 07:49 AM ? ? Modules accepted: Orders ? ?

## 2021-06-23 NOTE — Progress Notes (Signed)
Lab results show increase in her inflammatory labs this is concerning for temporal arteritis activity coming back with the decreased prednisone dose. This might be causing increased headaches and risks damaging vision again. She should increase prednisone dose back to 20 mg daily for a week then 10 mg daily until we follow up. I will send a new Rx. We need to follow up again in 2-4 weeks after going back up on the prednisone to recheck labs and discuss about possible change in treatment. ? ?Which ophthalmology office saw her most recently, can we request their last note/report?

## 2021-06-28 DIAGNOSIS — E1159 Type 2 diabetes mellitus with other circulatory complications: Secondary | ICD-10-CM | POA: Diagnosis not present

## 2021-06-28 DIAGNOSIS — E1169 Type 2 diabetes mellitus with other specified complication: Secondary | ICD-10-CM | POA: Diagnosis not present

## 2021-06-28 DIAGNOSIS — I10 Essential (primary) hypertension: Secondary | ICD-10-CM | POA: Diagnosis not present

## 2021-06-28 DIAGNOSIS — D692 Other nonthrombocytopenic purpura: Secondary | ICD-10-CM | POA: Diagnosis not present

## 2021-06-28 DIAGNOSIS — Z7952 Long term (current) use of systemic steroids: Secondary | ICD-10-CM | POA: Diagnosis not present

## 2021-06-28 DIAGNOSIS — E785 Hyperlipidemia, unspecified: Secondary | ICD-10-CM | POA: Diagnosis not present

## 2021-06-28 DIAGNOSIS — I509 Heart failure, unspecified: Secondary | ICD-10-CM | POA: Diagnosis not present

## 2021-06-28 DIAGNOSIS — Z7984 Long term (current) use of oral hypoglycemic drugs: Secondary | ICD-10-CM | POA: Diagnosis not present

## 2021-06-28 DIAGNOSIS — Z7902 Long term (current) use of antithrombotics/antiplatelets: Secondary | ICD-10-CM | POA: Diagnosis not present

## 2021-06-28 DIAGNOSIS — Z6821 Body mass index (BMI) 21.0-21.9, adult: Secondary | ICD-10-CM | POA: Diagnosis not present

## 2021-06-28 DIAGNOSIS — Z794 Long term (current) use of insulin: Secondary | ICD-10-CM | POA: Diagnosis not present

## 2021-06-28 DIAGNOSIS — M316 Other giant cell arteritis: Secondary | ICD-10-CM | POA: Diagnosis not present

## 2021-07-18 NOTE — Progress Notes (Signed)
Office Visit Note  Patient: Jenna Long             Date of Birth: 03-05-1934           MRN: [id number]             PCP: Sharilyn Sites, MD Referring: Sharilyn Sites, MD Visit Date: 07/26/2021   Subjective:   History of Present Illness: Jenna Long is a 87 y.o. female here for follow up  for temporal arteritis. At our last visit she had return of intermittent left sided headaches and inflammatory markers were elevated again. We increased prednisone again with taper back down to 10 mg daily. She is doing somewhat better, but describes at least one recent pain around left eye. Her blood pressures are remaining high since our last visit.  Previous HPI 06/22/2021 Jenna Long is a 87 y.o. female here for follow up for temporal arteritis.  She feels the prednisone tapering is going okay.  Blood sugars have improved.  She has regained some amount of vision though remains extremely minimal only seeing general blurred shapes.  She has been having trouble with left-sided headaches coming and going these do feel in the similar distribution as her previous temporal arteritis symptoms.  Her blood pressures have also had a a lot of variance is working on adjusting these medicines with PCP office.   Previous HPI 03/08/21 Jenna Long is a 87 y.o. female here for follow up for temporal arteritis with bilateral vision loss, ischemic cerebral infarct, and vertebral artery stenosis on prednisone taper down to 10 mg daily dose over past 2 months. She has not noticed any new significant symptoms with reducing her medication. She is having some right sided headache but mostly to the back and neck area. Her blood sugars have been decreasing along with the prednisone so adjusting insulin for this. She has been walking fairly well at home using wheelchair for travel out and about.   Previous HPI 12/29/[name] is a 87 y.o. female here for temporal arteritis. She was recently diagnosed after  hospitalization originally on 9/19 for new onset of right sided vision loss evaluation was found to have multiple problems including small vessel ischemic infarct in cerebellum, right vertebral artery stenosis, and temporal artery biopsy confirming GCA. She was started on a slow prednisone taper from 60 mg daily dose down stepwise and on ASA and plavix antiplatelet therapy. She was subsequently readmitted on 10/25 with development of multiple new ischemic infarcts suspected as secondary to orthostatic hypotension with existing small vessel and large vessel disease. She was discharged again on antiplatelet therapy and continuing prednisone taper starting from 40 mg daily dose down at each 2 week intervals. Unfortunately she now has bilateral vision loss additionally deconditioning and loss of balance. She is currently taking prednisone 20 mg PO daily just decreased from 30 mg daily dose. She has increased bruising on her forearms also uncontrolled hyperglycemia new start on insulin related to the prednisone treatment. Otherwise no complaints taking the medication. She is experiencing large variation in blood pressures at home some hypertensive to 287G systolic and some orthostatic hypotension down to 81L systolic. Her daughter is concerned due to the recent readmission with strokes after hypotension as preceded with similar symptoms and indigestion.    Labs reviewed 10/2020 ESR 111 CRP 8.6 Right temporal artery biopsy - Active arteritis with features consistent with temporal arteritis.    Review of Systems  Constitutional:  Negative for fatigue.  HENT:  Negative for mouth dryness.   Eyes:  Negative for dryness.  Respiratory:  Negative for shortness of breath.   Cardiovascular:  Positive for swelling in legs/feet.  Gastrointestinal:  Positive for diarrhea.  Endocrine: Negative for excessive thirst.  Genitourinary:  Negative for difficulty urinating.  Musculoskeletal:  Positive for gait problem.   Skin:  Negative for rash.  Allergic/Immunologic: Negative for susceptible to infections.  Neurological:  Negative for numbness.  Hematological:  Negative for bruising/bleeding tendency.  Psychiatric/Behavioral:  Negative for sleep disturbance.    PMFS History:  Patient Active Problem List   Diagnosis Date Noted   High risk medication use 07/26/2021   AMS (altered mental status) 03/24/2021   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 ([place]) 12/13/2020   Mixed diabetic hyperlipidemia associated with type 2 diabetes mellitus ([place]) 12/13/2020   Acute cystitis without hematuria 12/13/2020   Chronic diastolic CHF (congestive heart failure) ([place]) 12/13/2020   Vision loss of right eye    Dyslipidemia    Type 2 diabetes mellitus with hyperglycemia, with long-term current use of insulin (HCC)    Cerebral thrombosis with cerebral infarction 11/09/2020   Temporal arteritis ([place]) 11/08/2020   Diabetes mellitus type 2 in nonobese ([place]) 11/08/2020   DNR (do not resuscitate) 11/08/2020   Left displaced femoral neck fracture ([place]) 02/18/2020   Essential hypertension 02/18/2020   Mixed hyperlipidemia 02/18/2020   Fall     Past Medical History:  Diagnosis Date   Body mass index (BMI) 21.0-21.9, adult    Congestive heart failure (CHF) ([place])    Diabetes mellitus type II    DM (diabetes mellitus) ([place])    DNR (do not resuscitate) 11/08/2020   Hypercholesteremia    Hyperlipidemia    Hypertension    Osteoarthritis    Overweight    Polymyalgia rheumatica ([place])    Sequela of cerebrovascular accident    Temporal arteritis ([place])     Family History  Problem Relation Age of Onset   Stroke Mother    Heart disease Father    Cancer Sister    Anesthesia problems Neg Hx    Past Surgical History:  Procedure Laterality Date   ABDOMINAL HYSTERECTOMY  1991   [facility], [place]   left, [place]   ARTERY BIOPSY Right 11/09/2020   Procedure: BIOPSY TEMPORAL ARTERY;  Surgeon:  Cherre Robins, MD;  Location: [facility];  Service: Vascular;  Laterality: Right;   CATARACT EXTRACTION W/ INTRAOCULAR LENS IMPLANT  06/10/06    Right, [facility], [place]   CATARACT EXTRACTION W/PHACO  09/18/2010   Procedure: CATARACT EXTRACTION PHACO AND INTRAOCULAR LENS PLACEMENT ([place]);  Surgeon: Williams Che;  Location[facility];  Service: Ophthalmology;  Laterality: Left;   COLONOSCOPY W/ POLYPECTOMY  06/20/10   [facility], [place]   HIP ARTHROPLASTY Left 02/18/2020   Procedure: ARTHROPLASTY BIPOLAR HIP (HEMIARTHROPLASTY);  Surgeon: Mordecai Rasmussen, MD;  Location[facility];  Service: Orthopedics;  Laterality: Left;   Social History   Social History Narrative   Not on file   Immunization History  Administered Date(s) Administered   Fluad Quad(high Dose 65+) 11/19/2020   Moderna Sars-Covid-2 Vaccination 02/21/2020     Objective: Vital Signs: BP (!) 179/75 (BP Location: Left Arm, Patient Position: Sitting, Cuff Size: Normal)   Pulse (!) 58   Resp 12   Ht _0  (1.702 m)   Wt 137 lb 6.4 oz (62.3 kg)   BMI 21.52 kg/m    Physical  Exam Constitutional:      Comments: In wheelchair  Cardiovascular:     Rate and Rhythm: Normal rate and regular rhythm.  Pulmonary:     Effort: Pulmonary effort is normal.     Breath sounds: Normal breath sounds.  Musculoskeletal:     Right lower leg: No edema.     Left lower leg: No edema.  Skin:    General: Skin is warm and dry.     Findings: Bruising (forearms) present.  Neurological:     Mental Status: She is alert.     Musculoskeletal Exam:  Shoulders full ROM no tenderness or swelling Elbows full ROM no tenderness or swelling Wrists full ROM no tenderness or swelling Fingers full ROM no tenderness or swelling Knees full ROM no tenderness or swelling   Investigation: No additional findings.  Imaging: No results found.  Recent Labs: Lab Results  Component Value Date   WBC 5.4 03/28/2021   HGB 11.3 (L) 03/28/2021   PLT 237 03/28/2021   NA 139  03/28/2021   K 3.3 (L) 03/28/2021   CL 108 03/28/2021   CO2 23 03/28/2021   GLUCOSE 113 (H) 03/28/2021   BUN 23 03/28/2021   CREATININE 0.81 03/28/2021   BILITOT 0.5 03/24/2021   ALKPHOS 73 03/24/2021   AST 16 03/24/2021   ALT 25 03/24/2021   PROT 8.3 (H) 03/24/2021   ALBUMIN 4.2 03/24/2021   CALCIUM 9.0 03/28/2021   GFRAA >60 09/12/2010    Speciality Comments: No specialty comments available.  Procedures:  No procedures performed Allergies: Patient has no known allergies.   Assessment / Plan:     Visit Diagnoses: Temporal arteritis ([place]) - prednisone 46m by mouth daily.  - Plan: Sedimentation rate  Unfortunately still mostly persistent loss of vision. Intermittent left sided headache pain decreased frequency on higher prednisone but still occurring. Rechecking sed rate for monitoring on 10 mg prednisone dose. Plan to start with Actemra 162 mg [place] weekly for better control and steroid sparing medication. Prednisone is currently contributing to bruising and may be raising blood pressure and blood sugars.  Type 2 diabetes mellitus with hyperglycemia, with long-term current use of insulin (HCC)  Doing better with medication adjustment, plan to resume prednisone tapering after Actemra start.  High risk medication use - Plan: CBC with Differential/Platelet, COMPLETE METABOLIC PANEL WITH GFR, QuantiFERON-TB Gold Plus, Hepatitis B core antibody, IgM, Hepatitis B surface antigen, Hepatitis C antibody, HIV Antibody (routine testing w rflx), Lipid panel  Discussed risks of Actemra including infections, injection site reaction, cytopenia, hepatotoxicity, or hyperlipidemia. She has no history of diverticulitis or blood clots. Checking baseline labs or monitoring including CBC, CMP, lipid panel, quantiferon, hepatitis and HIV.  Orders: Orders Placed This Encounter  Procedures   Sedimentation rate   CBC with Differential/Platelet   COMPLETE METABOLIC PANEL WITH GFR   QuantiFERON-TB Gold  Plus   Hepatitis B core antibody, IgM   Hepatitis B surface antigen   Hepatitis C antibody   HIV Antibody (routine testing w rflx)   Lipid panel   No orders of the defined types were placed in this encounter.    Follow-Up Instructions: Return in about 3 months (around 10/26/2021) for GCA actemra start f/u 312mo   ChCollier SalinaMD  Note - This record has been created using [employer] Chart creation errors have been sought, but may not always  have been located. Such creation errors do not reflect on  the standard of medical care.

## 2021-07-26 ENCOUNTER — Ambulatory Visit: Payer: PPO | Admitting: Internal Medicine

## 2021-07-26 ENCOUNTER — Encounter: Payer: Self-pay | Admitting: Internal Medicine

## 2021-07-26 ENCOUNTER — Telehealth: Payer: Self-pay | Admitting: Pharmacist

## 2021-07-26 VITALS — BP 179/75 | HR 58 | Resp 12 | Ht 67.0 in | Wt 137.4 lb

## 2021-07-26 DIAGNOSIS — Z79899 Other long term (current) drug therapy: Secondary | ICD-10-CM | POA: Diagnosis not present

## 2021-07-26 DIAGNOSIS — Z794 Long term (current) use of insulin: Secondary | ICD-10-CM | POA: Diagnosis not present

## 2021-07-26 DIAGNOSIS — M316 Other giant cell arteritis: Secondary | ICD-10-CM

## 2021-07-26 DIAGNOSIS — E1165 Type 2 diabetes mellitus with hyperglycemia: Secondary | ICD-10-CM | POA: Diagnosis not present

## 2021-07-26 NOTE — Telephone Encounter (Signed)
Please start Actemra SQ BIV. Submit as urgent as possible.  Dose: '162mg'$  SQ every 7 days  Dx: temporal arteritis/ GCA (M31.6)  Previously tried therapies: Prednisone - current  [place] pt assistance paperwork completed today in clinic at [facility]. Will place in PAP pending info folder in pharmacy office while we await prior auth approval letter.  Patient will need new start visit once approved.  Knox Saliva, PharmD, MPH, BCPS, CPP Clinical Pharmacist (Rheumatology and Pulmonology)

## 2021-07-26 NOTE — Progress Notes (Signed)
Pharmacy Note Subjective: Patient presents today to the [facility] for follow up office visit. Patient seen by the pharmacist for counseling on Actemra for temporal arteritis.  History of hyperlipidemia: Yes  History of diverticulitis: No  Objective: CBC    Component Value Date/Time   WBC 5.4 03/28/2021 0252   RBC 3.54 (L) 03/28/2021 0252   HGB 11.3 (L) 03/28/2021 0252   HCT 33.9 (L) 03/28/2021 0252   PLT 237 03/28/2021 0252   MCV 95.8 03/28/2021 0252   MCH 31.9 03/28/2021 0252   MCHC 33.3 03/28/2021 0252   RDW 13.9 03/28/2021 0252   LYMPHSABS 1.7 03/28/2021 0252   MONOABS 0.6 03/28/2021 0252   EOSABS 0.0 03/28/2021 0252   BASOSABS 0.0 03/28/2021 0252    CMP     Component Value Date/Time   NA 139 03/28/2021 0252   K 3.3 (L) 03/28/2021 0252   CL 108 03/28/2021 0252   CO2 23 03/28/2021 0252   GLUCOSE 113 (H) 03/28/2021 0252   BUN 23 03/28/2021 0252   CREATININE 0.81 03/28/2021 0252   CALCIUM 9.0 03/28/2021 0252   PROT 8.3 (H) 03/24/2021 1940   ALBUMIN 4.2 03/24/2021 1940   AST 16 03/24/2021 1940   ALT 25 03/24/2021 1940   ALKPHOS 73 03/24/2021 1940   BILITOT 0.5 03/24/2021 1940   GFRNONAA >60 03/28/2021 0252   GFRAA >60 09/12/2010 1040     Baseline Immunosuppressant Therapy Labs        No results found for: HIV        Latest Ref Rng & Units 03/24/2021    7:40 PM  Serum Protein Electrophoresis  Total Protein 6.5 - 8.1 g/dL 8.3      No results found for: G6PDH  No results found for: TPMT   Lipid Panel Lab Results  Component Value Date   CHOL 175 03/25/2021   HDL 48 03/25/2021   LDLCALC 92 03/25/2021   TRIG 174 (H) 03/25/2021   CHOLHDL 3.6 03/25/2021     Chest x-ray: 03/24/2021 No acute cardiopulmonary process.  Assessment/Plan:  Counseled patient that Actemra is an IL-6 blocking agent.  Counseled patient on purpose, proper use, and adverse effects of Actemra.  Reviewed the most common adverse effects including infections,  infusion reactions, bowel injury, and rarely cancer and conditions of the nervous system.  Reviewed risk of lipid abnormalities and elevated liver enzymes. Discussed that there is the possibility of an increased risk of malignancy but it is not well understood if this increased risk is due to the medication or the disease state. Counseled patient that Actemra should be held prior to scheduled surgery for infections or new antibiotic use. Counseled patient that Actemra should be held prior to scheduled surgery. Reviewed the importance of regular labs while on Actemra therapy including the need for routine lipid panel.  Will recheck lipid panel after 4-8 weeks of starting Actemra, then every 6 months routinely. CBC and CMP will be monitored every 3 months. TB gold will be monitored annually. Standing orders placed.  Provided patient with medication education material and answered all questions.  Patient voiced understanding.  Patient consented to Actemra.  Will upload consent into the media tab.  Reviewed storage instructions of Actemra with patient.  Advised patient that initial Actemra injection must be given in the office.  Will apply for Actemra through patient's insurance for self-administration using auto-injector/prefilled syringe.  Patient dose will be 162 mg every 7 days.  Prescription pending lab results and/or insurance approval. Patient assistance  paperwork completed today  Joseph Art, Pharm.D. PGY-1 Pharmacy Resident 07/26/2021 12:00 PM

## 2021-07-27 ENCOUNTER — Other Ambulatory Visit (HOSPITAL_COMMUNITY): Payer: Self-pay

## 2021-07-27 NOTE — Telephone Encounter (Signed)
Submitted a Prior Authorization request to  [employer]  for ACTEMRA SQ via CoverMyMeds. Will update once we receive a response.  Key: [id number]  Knox Saliva, PharmD, MPH, BCPS, CPP Clinical Pharmacist (Rheumatology and Pulmonology)

## 2021-07-27 NOTE — Telephone Encounter (Signed)
Received notification from  [place] for [employer]  regarding a prior authorization for [place]. Authorization has been APPROVED from 07/27/21 to 02/18/22.   Per test claim, copay for 28 days supply is $568.59  Submitted Patient Assistance Application to [place] for [place] along with provider portion, patient portion, med list, insurance card copy, and PA approval letter. Will update patient when we receive a response.  Fax# [phone] Phone# [phone]  Knox Saliva, PharmD, MPH, BCPS, CPP Clinical Pharmacist (Rheumatology and Pulmonology)

## 2021-07-30 LAB — HEPATITIS B CORE ANTIBODY, IGM: Hep B C IgM: NONREACTIVE

## 2021-07-30 LAB — HEPATITIS C ANTIBODY
Hepatitis C Ab: NONREACTIVE
SIGNAL TO CUT-OFF: 0.07 (ref <1.00)

## 2021-07-30 LAB — CBC WITH DIFFERENTIAL/PLATELET
Absolute Monocytes: 451 cells/uL (ref 200–950)
Basophils Absolute: 9 cells/uL (ref 0–200)
Basophils Relative: 0.1 %
Eosinophils Absolute: 9 cells/uL — ABNORMAL LOW (ref 15–500)
Eosinophils Relative: 0.1 %
HCT: 39.4 % (ref 35.0–45.0)
Hemoglobin: 12.9 g/dL (ref 11.7–15.5)
Lymphs Abs: 1040 cells/uL (ref 850–3900)
MCH: 31.1 pg (ref 27.0–33.0)
MCHC: 32.7 g/dL (ref 32.0–36.0)
MCV: 94.9 fL (ref 80.0–100.0)
MPV: 9.5 fL (ref 7.5–12.5)
Monocytes Relative: 4.9 %
Neutro Abs: 7691 cells/uL (ref 1500–7800)
Neutrophils Relative %: 83.6 %
Platelets: 282 10*3/uL (ref 140–400)
RBC: 4.15 10*6/uL (ref 3.80–5.10)
RDW: 13.8 % (ref 11.0–15.0)
Total Lymphocyte: 11.3 %
WBC: 9.2 10*3/uL (ref 3.8–10.8)

## 2021-07-30 LAB — COMPLETE METABOLIC PANEL WITH GFR
AG Ratio: 1.3 (calc) (ref 1.0–2.5)
ALT: 15 U/L (ref 6–29)
AST: 10 U/L (ref 10–35)
Albumin: 4.1 g/dL (ref 3.6–5.1)
Alkaline phosphatase (APISO): 53 U/L (ref 37–153)
BUN: 15 mg/dL (ref 7–25)
CO2: 28 mmol/L (ref 20–32)
Calcium: 9.8 mg/dL (ref 8.6–10.4)
Chloride: 101 mmol/L (ref 98–110)
Creat: 0.61 mg/dL (ref 0.60–0.95)
Globulin: 3.1 g/dL (calc) (ref 1.9–3.7)
Glucose, Bld: 210 mg/dL — ABNORMAL HIGH (ref 65–99)
Potassium: 4 mmol/L (ref 3.5–5.3)
Sodium: 139 mmol/L (ref 135–146)
Total Bilirubin: 0.5 mg/dL (ref 0.2–1.2)
Total Protein: 7.2 g/dL (ref 6.1–8.1)
eGFR: 88 mL/min/{1.73_m2} (ref >=60)

## 2021-07-30 LAB — QUANTIFERON-TB GOLD PLUS
Mitogen-NIL: 3.79 IU/mL
NIL: 0.03 IU/mL
QuantiFERON-TB Gold Plus: NEGATIVE
TB1-NIL: 0 IU/mL
TB2-NIL: <0 IU/mL

## 2021-07-30 LAB — HIV ANTIBODY (ROUTINE TESTING W REFLEX): HIV 1&2 Ab, 4th Generation: NONREACTIVE

## 2021-07-30 LAB — LIPID PANEL
Cholesterol: 180 mg/dL (ref <200)
HDL: 57 mg/dL (ref >=50)
LDL Cholesterol (Calc): 94 mg/dL (calc)
Non-HDL Cholesterol (Calc): 123 mg/dL (calc) (ref <130)
Total CHOL/HDL Ratio: 3.2 (calc) (ref <5.0)
Triglycerides: 194 mg/dL — ABNORMAL HIGH (ref <150)

## 2021-07-30 LAB — HEPATITIS B SURFACE ANTIGEN: Hepatitis B Surface Ag: NONREACTIVE

## 2021-07-30 LAB — SEDIMENTATION RATE: Sed Rate: 22 mm/h (ref 0–30)

## 2021-07-31 NOTE — Telephone Encounter (Signed)
Received a fax from  [place] regarding an approval for [employer] patient assistance from 07/28/21 until therapy is discontinued or she no longer qualified for program  [facility] phone number: [phone] [facility]: [phone]  Spoke with patient's daughter, Butch Penny regarding approval. She states she did complete on-boarding phone call with Celso Amy on Friday, 07/28/21. I advised that we do not have any Actemra samples but should have some on the way. I provided her with phone number for [facility] and advised her to call to schedule shipment. If she receives shipment at home before we get sample, we will get patient started using her supply to prevent delay in initiation.  Knox Saliva, PharmD, MPH, BCPS, CPP Clinical Pharmacist (Rheumatology and Pulmonology)

## 2021-08-07 NOTE — Telephone Encounter (Signed)
Spoke with patients daughter- scheduled new start Actemra appointment for 6/22 at 10AM. Patient is supposed to receive shipment 08/08/21 and will bring 1 box to appointment as we do not have samples at the moment.  Joseph Art, Pharm.D. PGY-1 Pharmacy Resident 08/07/2021 8:11 AM

## 2021-08-08 DIAGNOSIS — M316 Other giant cell arteritis: Secondary | ICD-10-CM | POA: Diagnosis not present

## 2021-08-08 DIAGNOSIS — I10 Essential (primary) hypertension: Secondary | ICD-10-CM | POA: Diagnosis not present

## 2021-08-08 DIAGNOSIS — Z7952 Long term (current) use of systemic steroids: Secondary | ICD-10-CM | POA: Diagnosis not present

## 2021-08-08 DIAGNOSIS — D692 Other nonthrombocytopenic purpura: Secondary | ICD-10-CM | POA: Diagnosis not present

## 2021-08-08 DIAGNOSIS — Z7902 Long term (current) use of antithrombotics/antiplatelets: Secondary | ICD-10-CM | POA: Diagnosis not present

## 2021-08-10 ENCOUNTER — Ambulatory Visit (INDEPENDENT_AMBULATORY_CARE_PROVIDER_SITE_OTHER): Payer: PPO | Admitting: Pharmacist

## 2021-08-10 DIAGNOSIS — M316 Other giant cell arteritis: Secondary | ICD-10-CM

## 2021-08-10 DIAGNOSIS — Z79899 Other long term (current) drug therapy: Secondary | ICD-10-CM

## 2021-08-10 DIAGNOSIS — Z7189 Other specified counseling: Secondary | ICD-10-CM

## 2021-08-10 MED ORDER — ACTEMRA ACTPEN 162 MG/0.9ML ~~LOC~~ SOAJ: 162.0000 mg | SUBCUTANEOUS | 0 refills | Status: DC

## 2021-08-10 NOTE — Progress Notes (Addendum)
Pharmacy Note  Subjective:   Patient presents to clinic today to receive first dose of Actemra.  Patient running a fever or have signs/symptoms of infection? No  Patient currently on antibiotics for the treatment of infection? No  Patient have any upcoming invasive procedures/surgeries? Yes  Objective: CMP     Component Value Date/Time   NA 139 07/26/2021 1140   K 4.0 07/26/2021 1140   CL 101 07/26/2021 1140   CO2 28 07/26/2021 1140   GLUCOSE 210 (H) 07/26/2021 1140   BUN 15 07/26/2021 1140   CREATININE 0.61 07/26/2021 1140   CALCIUM 9.8 07/26/2021 1140   PROT 7.2 07/26/2021 1140   ALBUMIN 4.2 03/24/2021 1940   AST 10 07/26/2021 1140   ALT 15 07/26/2021 1140   ALKPHOS 73 03/24/2021 1940   BILITOT 0.5 07/26/2021 1140   GFRNONAA >60 03/28/2021 0252   GFRAA >60 09/12/2010 1040    CBC    Component Value Date/Time   WBC 9.2 07/26/2021 1140   RBC 4.15 07/26/2021 1140   HGB 12.9 07/26/2021 1140   HCT 39.4 07/26/2021 1140   PLT 282 07/26/2021 1140   MCV 94.9 07/26/2021 1140   MCH 31.1 07/26/2021 1140   MCHC 32.7 07/26/2021 1140   RDW 13.8 07/26/2021 1140   LYMPHSABS 1,040 07/26/2021 1140   MONOABS 0.6 03/28/2021 0252   EOSABS 9 (L) 07/26/2021 1140   BASOSABS 9 07/26/2021 1140    Baseline Immunosuppressant Therapy Labs TB GOLD    Latest Ref Rng & Units 07/26/2021   11:40 AM  Quantiferon TB Gold  Quantiferon TB Gold Plus NEGATIVE NEGATIVE    Hepatitis Panel    Latest Ref Rng & Units 07/26/2021   11:40 AM  Hepatitis  Hep B Surface Ag NON-REACTIVE NON-REACTIVE   Hep B IgM NON-REACTIVE NON-REACTIVE   Hep C Ab NON-REACTIVE NON-REACTIVE    HIV Lab Results  Component Value Date   HIV NON-REACTIVE 07/26/2021   Immunoglobulins   SPEP    Latest Ref Rng & Units 07/26/2021   11:40 AM  Serum Protein Electrophoresis  Total Protein 6.1 - 8.1 g/dL 7.2    G6PD No results found for: "G6PDH" TPMT No results found for: "TPMT"   Chest x-ray: (03/24/21)-  No acute  cardiopulmonary process.  Assessment/Plan:  Demonstrated proper injection technique with Actemra demo device  Patient able to demonstrate proper injection technique using the teach back method.  Patient self injected in the right thigh with:  Sample Medication: Actemra NDC: 82505-397-67 Lot: [id number] Expiration: 09/2022  Patient tolerated well.  Observed for 30 mins in office for adverse reaction and tolerated.   Patient is to return in 1 month for labs and 6-8 weeks for follow-up appointment.  Standing orders placed.   Actemra approved through patient assistance .   Rx sent to: [place] ([facility] for Actemra: [id number].  Patient provided with pharmacy phone number and advised to call later this week to schedule shipment to home.  She will continue Actemra 162 mg SQ every 7 days, discussed with Dr. Benjamine Mola, plan is for patient to continue prednisone 10 mg daily for 1 month then taper to 9 mg daily until follow up.   All questions encouraged and answered.  Instructed patient to call with any further questions or concerns.  Rodolph Bong, PharmD Candidate 08/10/2021 8:22 AM

## 2021-08-10 NOTE — Patient Instructions (Signed)
Your next ACTEMRA dose is due on 6/29, 7/6, and every 7 days thereafter  CONTINUE Prednisone 10 mg daily for one month, then decrease to 9 mg daily until follow up visit.  HOLD ACTEMRA if you have signs or symptoms of an infection. You can resume once you feel better or back to your baseline. HOLD ACTEMRA if you start antibiotics to treat an infection. HOLD ACTEMRA around the time of surgery/procedures. Your surgeon will be able to provide recommendations on when to hold BEFORE and when you are cleared to [place].  Labs are due in 1 month then every 3 months. Lab hours are from Monday to Thursday 1:30-4:30pm and Friday 1:30-4pm. You do not need an appointment if you come for labs during these times.  How to manage an injection site reaction: Remember the 5 C's: COUNTER - leave on the counter at least 30 minutes but up to overnight to bring medication to room temperature. This may help prevent stinging COLD - place something cold (like an ice gel pack or cold water bottle) on the injection site just before cleansing with alcohol. This may help reduce pain CLARITIN - use Claritin (generic name is loratadine) for the first two weeks of treatment or the day of, the day before, and the day after injecting. This will help to minimize injection site reactions CORTISONE CREAM - apply if injection site is irritated and itching CALL ME - if injection site reaction is bigger than the size of your fist, looks infected, blisters, or if you develop hives

## 2021-08-14 ENCOUNTER — Other Ambulatory Visit: Payer: Self-pay | Admitting: Internal Medicine

## 2021-08-14 DIAGNOSIS — M316 Other giant cell arteritis: Secondary | ICD-10-CM

## 2021-09-13 ENCOUNTER — Other Ambulatory Visit: Payer: Self-pay | Admitting: Internal Medicine

## 2021-09-13 DIAGNOSIS — M316 Other giant cell arteritis: Secondary | ICD-10-CM

## 2021-09-13 NOTE — Telephone Encounter (Signed)
Next Visit: 10/02/2021  Last Visit: 07/26/2021  Last Fill: 03/10/2021  Dx: Temporal arteritis   Current Dose per office note on 07/26/2021: prednisone taper starting from 40 mg daily dose down at each 2 week intervals,  She is currently taking prednisone 20 mg PO daily just decreased from 30 mg daily dose. Rechecking sed rate for monitoring on 10 mg prednisone dose. I called patient, patient is currently taking 9 mg daily until f/u appt 09/30/2021?   Okay to refill Prednisone?

## 2021-09-14 MED ORDER — PREDNISONE 1 MG PO TABS: ORAL_TABLET | ORAL | 1 refills | Status: DC

## 2021-09-19 NOTE — Progress Notes (Signed)
Office Visit Note  Patient: Jenna Long             Date of Birth: 1934/06/05           MRN: [id number]             PCP: Sharilyn Sites, MD Referring: Sharilyn Sites, MD Visit Date: 10/02/2021   Subjective:  Follow-up (Ear pain and headaches. )   History of Present Illness: Jenna Long is a 87 y.o. female here for follow up temporal arteritis.  She started the Actemra injections since more than a month ago and remains on prednisone 9 mg daily.  She continues having intermittent left-sided headaches centered around the side of the left eye and tend to be a stinging type of pain.  Does not describe any jaw pain difficulty eating no pain extending into the neck or shoulders.   Previous HPI 07/26/2021 Jenna Long is a 87 y.o. female here for follow up  for temporal arteritis. At our last visit she had return of intermittent left sided headaches and inflammatory markers were elevated again. We increased prednisone again with taper back down to 10 mg daily. She is doing somewhat better, but describes at least one recent pain around left eye. Her blood pressures are remaining high since our last visit.   Previous HPI 06/22/2021 Jenna Long is a 87 y.o. female here for follow up for temporal arteritis.  She feels the prednisone tapering is going okay.  Blood sugars have improved.  She has regained some amount of vision though remains extremely minimal only seeing general blurred shapes.  She has been having trouble with left-sided headaches coming and going these do feel in the similar distribution as her previous temporal arteritis symptoms.  Her blood pressures have also had a a lot of variance is working on adjusting these medicines with PCP office.   Previous HPI 03/08/21 Jenna Long is a 87 y.o. female here for follow up for temporal arteritis with bilateral vision loss, ischemic cerebral infarct, and vertebral artery stenosis on prednisone taper down to 10 mg daily dose over past 2  months. She has not noticed any new significant symptoms with reducing her medication. She is having some right sided headache but mostly to the back and neck area. Her blood sugars have been decreasing along with the prednisone so adjusting insulin for this. She has been walking fairly well at home using wheelchair for travel out and about.   Previous HPI 12/29/[name] is a 87 y.o. female here for temporal arteritis. She was recently diagnosed after hospitalization originally on 9/19 for new onset of right sided vision loss evaluation was found to have multiple problems including small vessel ischemic infarct in cerebellum, right vertebral artery stenosis, and temporal artery biopsy confirming GCA. She was started on a slow prednisone taper from 60 mg daily dose down stepwise and on ASA and plavix antiplatelet therapy. She was subsequently readmitted on 10/25 with development of multiple new ischemic infarcts suspected as secondary to orthostatic hypotension with existing small vessel and large vessel disease. She was discharged again on antiplatelet therapy and continuing prednisone taper starting from 40 mg daily dose down at each 2 week intervals. Unfortunately she now has bilateral vision loss additionally deconditioning and loss of balance. She is currently taking prednisone 20 mg PO daily just decreased from 30 mg daily dose. She has increased bruising on her forearms also uncontrolled hyperglycemia new start on insulin related to  the prednisone treatment. Otherwise no complaints taking the medication. She is experiencing large variation in blood pressures at home some hypertensive to 532D systolic and some orthostatic hypotension down to 92E systolic. Her daughter is concerned due to the recent readmission with strokes after hypotension as preceded with similar symptoms and indigestion.    Labs reviewed 10/2020 ESR 111 CRP 8.6 Right temporal artery biopsy - Active arteritis with features  consistent with temporal arteritis.    Review of Systems  Constitutional:  Positive for fatigue.  HENT:  Negative for mouth sores and mouth dryness.   Eyes:  Negative for dryness.  Respiratory:  Negative for shortness of breath.   Cardiovascular:  Negative for chest pain and palpitations.  Gastrointestinal:  Positive for diarrhea. Negative for blood in stool and constipation.  Endocrine: Negative for increased urination.  Genitourinary:  Positive for involuntary urination.  Musculoskeletal:  Positive for joint pain and joint pain. Negative for joint swelling, myalgias, muscle weakness, morning stiffness, muscle tenderness and myalgias.  Skin:  Positive for hair loss. Negative for color change, rash and sensitivity to sunlight.  Allergic/Immunologic: Negative for susceptible to infections.  Neurological:  Positive for headaches. Negative for dizziness.  Hematological:  Negative for swollen glands.  Psychiatric/Behavioral:  Positive for depressed mood. Negative for sleep disturbance. The patient is nervous/anxious.     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) 11/08/2020   Diabetes mellitus type 2 in nonobese ([place]) 11/08/2020   DNR (do not resuscitate) 11/08/2020   Left displaced femoral neck fracture ([place]) 02/18/2020   Essential hypertension 02/18/2020   Mixed hyperlipidemia 02/18/2020    Fall     Past Medical History:  Diagnosis Date   Body mass index (BMI) 21.0-21.9, adult    Congestive heart failure (CHF) ([place])    Diabetes mellitus type II    DM (diabetes mellitus) ([place])    DNR (do not resuscitate) 11/08/2020   Hypercholesteremia    Hyperlipidemia    Hypertension    Osteoarthritis    Overweight    Polymyalgia rheumatica ([place])    [place] of cerebrovascular accident    Temporal arteritis ([place])     Family History  Problem Relation Age of Onset   Stroke Mother    Heart disease Father    Cancer Sister    Anesthesia problems Neg Hx    Past Surgical History:  Procedure Laterality Date   ABDOMINAL HYSTERECTOMY  1991   [facility], [place]   left, [place]   ARTERY BIOPSY Right 11/09/2020   Procedure: BIOPSY TEMPORAL ARTERY;  Surgeon: Cherre Robins, MD;  Location: [facility];  Service: Vascular;  Laterality: Right;   CATARACT EXTRACTION W/ INTRAOCULAR LENS IMPLANT  06/10/06    Right, [facility], [place]   CATARACT EXTRACTION W/PHACO  09/18/2010   Procedure: CATARACT EXTRACTION PHACO AND INTRAOCULAR LENS PLACEMENT ([place]);  Surgeon: Williams Che;  [facility];  Service: Ophthalmology;  Laterality: Left;   COLONOSCOPY W/ POLYPECTOMY  06/20/10   [facility], [place]   HIP ARTHROPLASTY Left 02/18/2020   Procedure: ARTHROPLASTY BIPOLAR HIP (HEMIARTHROPLASTY);  Surgeon: Mordecai Rasmussen, MD;  Location: [facility];  Service: Orthopedics;  Laterality: Left;   Social History   Social History Narrative   Not on file   Immunization History  Administered Date(s) Administered   Fluad Quad(high Dose 65+) 11/19/2020   Moderna Sars-Covid-2 Vaccination 02/21/2020     Objective: Vital Signs: BP (!) 166/74 (BP Location: Right Arm, Patient Position: Sitting, Cuff Size: Normal)   Pulse 68   Resp 13   Ht 5' 7" (1.702 m)   Wt 134 lb 9.6 oz (61.1 kg)   BMI 21.08 kg/m    Physical Exam Cardiovascular:     Rate and Rhythm: Normal rate and regular rhythm.  Pulmonary:      Effort: Pulmonary effort is normal.     Breath sounds: Normal breath sounds.  Musculoskeletal:     Right lower leg: No edema.     Left lower leg: No edema.  Skin:    General: Skin is warm and dry.     Findings: No rash.  Neurological:     Mental Status: She is alert.  Psychiatric:        Mood and Affect: Mood normal.      Musculoskeletal Exam:  Neck full ROM no tenderness Shoulders full ROM no tenderness or swelling Elbows full ROM no tenderness or swelling Wrists full ROM no tenderness or swelling Fingers full ROM no tenderness or swelling Knees full ROM no tenderness or swelling Ankles full ROM no tenderness or swelling   Investigation: No additional findings.  Imaging: No results found.  Recent Labs: Lab Results  Component Value Date   WBC 9.2 07/26/2021   HGB 12.9 07/26/2021   PLT 282 07/26/2021   NA 139 07/26/2021   K 4.0 07/26/2021   CL 101 07/26/2021   CO2 28 07/26/2021   GLUCOSE 210 (H) 07/26/2021   BUN 15 07/26/2021   CREATININE 0.61 07/26/2021   BILITOT 0.5 07/26/2021   ALKPHOS 73 03/24/2021   AST 10 07/26/2021   ALT 15 07/26/2021   PROT 7.2 07/26/2021   ALBUMIN 4.2 03/24/2021   CALCIUM 9.8 07/26/2021   GFRAA >60 09/12/2010   QFTBGOLDPLUS NEGATIVE 07/26/2021    Speciality Comments: No specialty comments available.  Procedures:  No procedures performed Allergies: Patient has no known allergies.   Assessment / Plan:     Visit Diagnoses: Temporal arteritis ([place]) - Plan: Sedimentation rate, predniSONE (DELTASONE) 1 MG tablet, predniSONE (DELTASONE) 5 MG tablet  Symptoms appear stable today still having some left-sided headache are not sure whether this is associated with inflammation though.  Otherwise stable no new or progressing symptoms.  We will recheck sedimentation rate.  Plan is to continue Actemra injections weekly and will start tapering prednisone dose down by 1 mg daily dose per week.  High risk medication use - Prednisone 5 mg and 1  mg take in combination with 1 MG tablets FOR daily DOSE: 9 mg daily. - Plan: CBC with Differential/Platelet, COMPLETE METABOLIC PANEL WITH GFR, Lipid panel  Appears to be tolerating Actemra without any new issues.  We will recheck CBC CMP and lipid panel today for drug toxicity monitoring.  Not sure whether medications are contributing to her hypertension diastolic pressure is not too high so do not recommend any aggressive intervention may improved with prednisone tapering  as well.  Vision loss of both eyes  No significant change, following up with ophthalmology scheduled December.  Type 2 diabetes mellitus with hyperglycemia, with long-term current use of insulin (HCC)  Blood sugars improving had some tapering of medications suspect this should continue to improve with reducing steroid dose.  Orders: Orders Placed This Encounter  Procedures   Sedimentation rate   CBC with Differential/Platelet   COMPLETE METABOLIC PANEL WITH GFR   Lipid panel   Meds ordered this encounter  Medications   predniSONE (DELTASONE) 1 MG tablet    Sig: Take in combination with 5 mg tablets for total daily dose as directed    Dispense:  140 tablet    Refill:  0   predniSONE (DELTASONE) 5 MG tablet    Sig: Take in combination with 1 mg tablets for total daily dose as directed    Dispense:  35 tablet    Refill:  0     Follow-Up Instructions: Return in about 3 months (around 01/02/2022) for GCA on Actemra/GC f/u 59mo.   CCollier Salina MD  Note - This record has been created using [employer]  Chart creation errors have been sought, but may not always  have been located. Such creation errors do not reflect on  the standard of medical care.

## 2021-09-20 DIAGNOSIS — M353 Polymyalgia rheumatica: Secondary | ICD-10-CM | POA: Diagnosis not present

## 2021-09-20 DIAGNOSIS — E119 Type 2 diabetes mellitus without complications: Secondary | ICD-10-CM | POA: Diagnosis not present

## 2021-09-20 DIAGNOSIS — I69354 Hemiplegia and hemiparesis following cerebral infarction affecting left non-dominant side: Secondary | ICD-10-CM | POA: Diagnosis not present

## 2021-09-20 DIAGNOSIS — I110 Hypertensive heart disease with heart failure: Secondary | ICD-10-CM | POA: Diagnosis not present

## 2021-09-20 DIAGNOSIS — I5032 Chronic diastolic (congestive) heart failure: Secondary | ICD-10-CM | POA: Diagnosis not present

## 2021-09-20 DIAGNOSIS — I69998 Other sequelae following unspecified cerebrovascular disease: Secondary | ICD-10-CM | POA: Diagnosis not present

## 2021-09-20 DIAGNOSIS — E782 Mixed hyperlipidemia: Secondary | ICD-10-CM | POA: Diagnosis not present

## 2021-09-20 DIAGNOSIS — M1991 Primary osteoarthritis, unspecified site: Secondary | ICD-10-CM | POA: Diagnosis not present

## 2021-09-20 DIAGNOSIS — M316 Other giant cell arteritis: Secondary | ICD-10-CM | POA: Diagnosis not present

## 2021-09-20 DIAGNOSIS — Z6822 Body mass index (BMI) 22.0-22.9, adult: Secondary | ICD-10-CM | POA: Diagnosis not present

## 2021-09-20 DIAGNOSIS — I10 Essential (primary) hypertension: Secondary | ICD-10-CM | POA: Diagnosis not present

## 2021-09-25 DIAGNOSIS — Z7960 Long term (current) use of unspecified immunomodulators and immunosuppressants: Secondary | ICD-10-CM | POA: Diagnosis not present

## 2021-09-25 DIAGNOSIS — M316 Other giant cell arteritis: Secondary | ICD-10-CM | POA: Diagnosis not present

## 2021-10-02 ENCOUNTER — Encounter: Payer: Self-pay | Admitting: Internal Medicine

## 2021-10-02 ENCOUNTER — Ambulatory Visit: Payer: PPO | Attending: Internal Medicine | Admitting: Internal Medicine

## 2021-10-02 VITALS — BP 166/74 | HR 68 | Resp 13 | Ht 67.0 in | Wt 134.6 lb

## 2021-10-02 DIAGNOSIS — E1165 Type 2 diabetes mellitus with hyperglycemia: Secondary | ICD-10-CM | POA: Diagnosis not present

## 2021-10-02 DIAGNOSIS — M316 Other giant cell arteritis: Secondary | ICD-10-CM

## 2021-10-02 DIAGNOSIS — H5461 Unqualified visual loss, right eye, normal vision left eye: Secondary | ICD-10-CM | POA: Diagnosis not present

## 2021-10-02 DIAGNOSIS — Z794 Long term (current) use of insulin: Secondary | ICD-10-CM

## 2021-10-02 DIAGNOSIS — Z79899 Other long term (current) drug therapy: Secondary | ICD-10-CM | POA: Diagnosis not present

## 2021-10-02 MED ORDER — PREDNISONE 1 MG PO TABS: ORAL_TABLET | ORAL | 0 refills | Status: AC

## 2021-10-02 MED ORDER — PREDNISONE 5 MG PO TABS: ORAL_TABLET | ORAL | 0 refills | Status: DC

## 2021-10-02 NOTE — Patient Instructions (Signed)
I recommend tapering down the prednisone dose by 1 mg per week: 9, 8, 7, 6, 5, 4, 3, 2, then 1 mg daily for total of 9 weeks duration. Sent '5mg'$  and 1 mg tablets to combine for target daily dose. Then we can follow up on just the Actemra shots.

## 2021-10-03 LAB — CBC WITH DIFFERENTIAL/PLATELET
Absolute Monocytes: 409 cells/uL (ref 200–950)
Basophils Absolute: 22 cells/uL (ref 0–200)
Basophils Relative: 0.5 %
Eosinophils Absolute: 30 cells/uL (ref 15–500)
Eosinophils Relative: 0.7 %
HCT: 40.8 % (ref 35.0–45.0)
Hemoglobin: 13.8 g/dL (ref 11.7–15.5)
Lymphs Abs: 1393 cells/uL (ref 850–3900)
MCH: 33 pg (ref 27.0–33.0)
MCHC: 33.8 g/dL (ref 32.0–36.0)
MCV: 97.6 fL (ref 80.0–100.0)
MPV: 10 fL (ref 7.5–12.5)
Monocytes Relative: 9.5 %
Neutro Abs: 2447 cells/uL (ref 1500–7800)
Neutrophils Relative %: 56.9 %
Platelets: 173 10*3/uL (ref 140–400)
RBC: 4.18 10*6/uL (ref 3.80–5.10)
RDW: 15.4 % — ABNORMAL HIGH (ref 11.0–15.0)
Total Lymphocyte: 32.4 %
WBC: 4.3 10*3/uL (ref 3.8–10.8)

## 2021-10-03 LAB — COMPLETE METABOLIC PANEL WITH GFR
AG Ratio: 1.8 (calc) (ref 1.0–2.5)
ALT: 21 U/L (ref 6–29)
AST: 12 U/L (ref 10–35)
Albumin: 4.2 g/dL (ref 3.6–5.1)
Alkaline phosphatase (APISO): 39 U/L (ref 37–153)
BUN: 20 mg/dL (ref 7–25)
CO2: 25 mmol/L (ref 20–32)
Calcium: 9.8 mg/dL (ref 8.6–10.4)
Chloride: 106 mmol/L (ref 98–110)
Creat: 0.78 mg/dL (ref 0.60–0.95)
Globulin: 2.3 g/dL (calc) (ref 1.9–3.7)
Glucose, Bld: 181 mg/dL — ABNORMAL HIGH (ref 65–99)
Potassium: 3.7 mmol/L (ref 3.5–5.3)
Sodium: 144 mmol/L (ref 135–146)
Total Bilirubin: 0.6 mg/dL (ref 0.2–1.2)
Total Protein: 6.5 g/dL (ref 6.1–8.1)
eGFR: 74 mL/min/{1.73_m2} (ref >=60)

## 2021-10-03 LAB — SEDIMENTATION RATE: Sed Rate: 6 mm/h (ref 0–30)

## 2021-10-03 LAB — LIPID PANEL
Cholesterol: 183 mg/dL (ref <200)
HDL: 46 mg/dL — ABNORMAL LOW (ref >=50)
LDL Cholesterol (Calc): 101 mg/dL (calc) — ABNORMAL HIGH
Non-HDL Cholesterol (Calc): 137 mg/dL (calc) — ABNORMAL HIGH (ref <130)
Total CHOL/HDL Ratio: 4 (calc) (ref <5.0)
Triglycerides: 255 mg/dL — ABNORMAL HIGH (ref <150)

## 2021-10-03 NOTE — Progress Notes (Signed)
Sedimentation rate is normal so no problems continuing prednisone taper as planned. Her blood count and liver function tests are normal. Cholesterol level went up by a small amount. The non-HDL (bad cholesterol) increased from 123 to 137. I don't think this is a problem for continuing Actemra as planned.

## 2021-10-31 DIAGNOSIS — Z515 Encounter for palliative care: Secondary | ICD-10-CM | POA: Diagnosis not present

## 2021-10-31 DIAGNOSIS — E785 Hyperlipidemia, unspecified: Secondary | ICD-10-CM | POA: Diagnosis not present

## 2021-10-31 DIAGNOSIS — M316 Other giant cell arteritis: Secondary | ICD-10-CM | POA: Diagnosis not present

## 2021-10-31 DIAGNOSIS — Z6821 Body mass index (BMI) 21.0-21.9, adult: Secondary | ICD-10-CM | POA: Diagnosis not present

## 2021-10-31 DIAGNOSIS — D692 Other nonthrombocytopenic purpura: Secondary | ICD-10-CM | POA: Diagnosis not present

## 2021-10-31 DIAGNOSIS — Z7902 Long term (current) use of antithrombotics/antiplatelets: Secondary | ICD-10-CM | POA: Diagnosis not present

## 2021-10-31 DIAGNOSIS — I10 Essential (primary) hypertension: Secondary | ICD-10-CM | POA: Diagnosis not present

## 2021-11-13 ENCOUNTER — Other Ambulatory Visit: Payer: Self-pay | Admitting: *Deleted

## 2021-11-13 DIAGNOSIS — Z79899 Other long term (current) drug therapy: Secondary | ICD-10-CM

## 2021-11-13 DIAGNOSIS — M316 Other giant cell arteritis: Secondary | ICD-10-CM

## 2021-11-13 MED ORDER — ACTEMRA ACTPEN 162 MG/0.9ML ~~LOC~~ SOAJ: 162.0000 mg | SUBCUTANEOUS | 0 refills | Status: DC

## 2021-11-13 NOTE — Telephone Encounter (Signed)
Next Visit: 01/02/2022  Last Visit: 10/02/2021  Last Fill: 08/10/2021  DX: Temporal arteritis   Current Dose per office note 10/02/2021: Actemra 162 mg SQ every 7 days 08/10/2021 note  Labs: 10/02/2021  Sedimentation rate is normal so no problems continuing prednisone taper as planned. Her blood count and liver function tests are normal. Cholesterol level went up by a small amount. The non-HDL (bad cholesterol) increased from 123 to 137. I don't think this is a problem for continuing Actemra as planned.  TB Gold: 07/26/2021   NEGATIVE   Okay to refill Actemra?

## 2021-11-15 ENCOUNTER — Other Ambulatory Visit: Payer: Self-pay

## 2021-11-15 ENCOUNTER — Emergency Department (HOSPITAL_COMMUNITY): Payer: PPO

## 2021-11-15 ENCOUNTER — Encounter (HOSPITAL_COMMUNITY): Payer: Self-pay | Admitting: *Deleted

## 2021-11-15 ENCOUNTER — Observation Stay (HOSPITAL_COMMUNITY)
Admission: EM | Admit: 2021-11-15 | Discharge: 2021-11-17 | Disposition: A | Discharge status: Home / Self Care | Payer: PPO | Attending: Internal Medicine | Admitting: Internal Medicine

## 2021-11-15 ENCOUNTER — Telehealth: Payer: Self-pay

## 2021-11-15 DIAGNOSIS — Z7902 Long term (current) use of antithrombotics/antiplatelets: Secondary | ICD-10-CM | POA: Insufficient documentation

## 2021-11-15 DIAGNOSIS — Z96642 Presence of left artificial hip joint: Secondary | ICD-10-CM | POA: Diagnosis not present

## 2021-11-15 DIAGNOSIS — M316 Other giant cell arteritis: Secondary | ICD-10-CM | POA: Diagnosis present

## 2021-11-15 DIAGNOSIS — I110 Hypertensive heart disease with heart failure: Secondary | ICD-10-CM | POA: Insufficient documentation

## 2021-11-15 DIAGNOSIS — E1165 Type 2 diabetes mellitus with hyperglycemia: Secondary | ICD-10-CM | POA: Diagnosis not present

## 2021-11-15 DIAGNOSIS — Z7984 Long term (current) use of oral hypoglycemic drugs: Secondary | ICD-10-CM | POA: Diagnosis not present

## 2021-11-15 DIAGNOSIS — S2241XA Multiple fractures of ribs, right side, initial encounter for closed fracture: Secondary | ICD-10-CM | POA: Diagnosis not present

## 2021-11-15 DIAGNOSIS — Z79899 Other long term (current) drug therapy: Secondary | ICD-10-CM | POA: Insufficient documentation

## 2021-11-15 DIAGNOSIS — Y92009 Unspecified place in unspecified non-institutional (private) residence as the place of occurrence of the external cause: Secondary | ICD-10-CM | POA: Insufficient documentation

## 2021-11-15 DIAGNOSIS — Z794 Long term (current) use of insulin: Secondary | ICD-10-CM

## 2021-11-15 DIAGNOSIS — S0990XA Unspecified injury of head, initial encounter: Secondary | ICD-10-CM | POA: Insufficient documentation

## 2021-11-15 DIAGNOSIS — S2249XA Multiple fractures of ribs, unspecified side, initial encounter for closed fracture: Secondary | ICD-10-CM | POA: Diagnosis present

## 2021-11-15 DIAGNOSIS — S299XXA Unspecified injury of thorax, initial encounter: Secondary | ICD-10-CM | POA: Diagnosis not present

## 2021-11-15 DIAGNOSIS — I10 Essential (primary) hypertension: Secondary | ICD-10-CM | POA: Diagnosis not present

## 2021-11-15 DIAGNOSIS — Z8673 Personal history of transient ischemic attack (TIA), and cerebral infarction without residual deficits: Secondary | ICD-10-CM | POA: Diagnosis not present

## 2021-11-15 DIAGNOSIS — S199XXA Unspecified injury of neck, initial encounter: Secondary | ICD-10-CM | POA: Diagnosis not present

## 2021-11-15 DIAGNOSIS — I63522 Cerebral infarction due to unspecified occlusion or stenosis of left anterior cerebral artery: Secondary | ICD-10-CM | POA: Diagnosis present

## 2021-11-15 DIAGNOSIS — R0781 Pleurodynia: Secondary | ICD-10-CM | POA: Diagnosis present

## 2021-11-15 DIAGNOSIS — I700 Atherosclerosis of aorta: Secondary | ICD-10-CM | POA: Diagnosis not present

## 2021-11-15 DIAGNOSIS — I5032 Chronic diastolic (congestive) heart failure: Secondary | ICD-10-CM | POA: Diagnosis not present

## 2021-11-15 DIAGNOSIS — W19XXXA Unspecified fall, initial encounter: Secondary | ICD-10-CM | POA: Diagnosis not present

## 2021-11-15 DIAGNOSIS — I517 Cardiomegaly: Secondary | ICD-10-CM | POA: Diagnosis not present

## 2021-11-15 LAB — URINALYSIS, ROUTINE W REFLEX MICROSCOPIC
Bilirubin Urine: NEGATIVE
Glucose, UA: NEGATIVE mg/dL
Hgb urine dipstick: NEGATIVE
Ketones, ur: 5 mg/dL — AB
Leukocytes,Ua: NEGATIVE
Nitrite: POSITIVE — AB
Protein, ur: NEGATIVE mg/dL
Specific Gravity, Urine: 1.011 (ref 1.005–1.030)
pH: 7 (ref 5.0–8.0)

## 2021-11-15 LAB — BASIC METABOLIC PANEL
Anion gap: 11 (ref 5–15)
BUN: 19 mg/dL (ref 8–23)
CO2: 24 mmol/L (ref 22–32)
Calcium: 9.3 mg/dL (ref 8.9–10.3)
Chloride: 104 mmol/L (ref 98–111)
Creatinine, Ser: 0.85 mg/dL (ref 0.44–1.00)
GFR, Estimated: >60 mL/min (ref >60)
Glucose, Bld: 223 mg/dL — ABNORMAL HIGH (ref 70–99)
Potassium: 4.7 mmol/L (ref 3.5–5.1)
Sodium: 139 mmol/L (ref 135–145)

## 2021-11-15 LAB — GLUCOSE, CAPILLARY
Glucose-Capillary: 255 mg/dL — ABNORMAL HIGH (ref 70–99)
Glucose-Capillary: 266 mg/dL — ABNORMAL HIGH (ref 70–99)

## 2021-11-15 LAB — CBC WITH DIFFERENTIAL/PLATELET
Abs Immature Granulocytes: 0 10*3/uL (ref 0.00–0.07)
Band Neutrophils: 18 %
Basophils Absolute: 0 10*3/uL (ref 0.0–0.1)
Basophils Relative: 0 %
Eosinophils Absolute: 0 10*3/uL (ref 0.0–0.5)
Eosinophils Relative: 0 %
HCT: 37.3 % (ref 36.0–46.0)
Hemoglobin: 13.1 g/dL (ref 12.0–15.0)
Lymphocytes Relative: 7 %
Lymphs Abs: 0.4 10*3/uL — ABNORMAL LOW (ref 0.7–4.0)
MCH: 34.6 pg — ABNORMAL HIGH (ref 26.0–34.0)
MCHC: 35.1 g/dL (ref 30.0–36.0)
MCV: 98.4 fL (ref 80.0–100.0)
Monocytes Absolute: 0.3 10*3/uL (ref 0.1–1.0)
Monocytes Relative: 6 %
Neutro Abs: 4.7 10*3/uL (ref 1.7–7.7)
Neutrophils Relative %: 69 %
Platelets: 155 10*3/uL (ref 150–400)
RBC: 3.79 MIL/uL — ABNORMAL LOW (ref 3.87–5.11)
RDW: 13.4 % (ref 11.5–15.5)
WBC: 5.4 10*3/uL (ref 4.0–10.5)
nRBC: 0 % (ref 0.0–0.2)

## 2021-11-15 LAB — HEMOGLOBIN A1C
Hgb A1c MFr Bld: 6.3 % — ABNORMAL HIGH (ref 4.8–5.6)
Mean Plasma Glucose: 134.11 mg/dL

## 2021-11-15 MED ORDER — POLYETHYLENE GLYCOL 3350 17 G PO PACK: 17.0000 g | PACK | Freq: Every day | ORAL | Status: DC | PRN

## 2021-11-15 MED ORDER — INSULIN ASPART 100 UNIT/ML IJ SOLN
0.0000 [IU] | Freq: Every day | INTRAMUSCULAR | Status: DC
  Administered 2021-11-15: 3 [IU] via SUBCUTANEOUS

## 2021-11-15 MED ORDER — SODIUM CHLORIDE 0.9 % IV SOLN: 1.0000 g | INTRAVENOUS | Status: DC

## 2021-11-15 MED ORDER — DORZOLAMIDE HCL 2 % OP SOLN
1.0000 [drp] | Freq: Two times a day (BID) | OPHTHALMIC | Status: DC
  Administered 2021-11-15 – 2021-11-17 (×4): 1 [drp] via OPHTHALMIC
  Filled 2021-11-15: qty 10

## 2021-11-15 MED ORDER — ONDANSETRON HCL 4 MG/2ML IJ SOLN: 4.0000 mg | Freq: Four times a day (QID) | INTRAMUSCULAR | Status: DC | PRN

## 2021-11-15 MED ORDER — ONDANSETRON HCL 4 MG/2ML IJ SOLN
4.0000 mg | Freq: Once | INTRAMUSCULAR | Status: AC
  Administered 2021-11-15: 4 mg via INTRAVENOUS
  Filled 2021-11-15: qty 2

## 2021-11-15 MED ORDER — INSULIN ASPART 100 UNIT/ML IJ SOLN
0.0000 [IU] | Freq: Three times a day (TID) | INTRAMUSCULAR | Status: DC
  Administered 2021-11-16: 3 [IU] via SUBCUTANEOUS
  Administered 2021-11-16: 7 [IU] via SUBCUTANEOUS
  Administered 2021-11-16 – 2021-11-17 (×2): 2 [IU] via SUBCUTANEOUS

## 2021-11-15 MED ORDER — DONEPEZIL HCL 5 MG PO TABS
5.0000 mg | ORAL_TABLET | Freq: Every day | ORAL | Status: DC
  Administered 2021-11-15 – 2021-11-16 (×2): 5 mg via ORAL
  Filled 2021-11-15 (×2): qty 1

## 2021-11-15 MED ORDER — ESCITALOPRAM OXALATE 10 MG PO TABS
10.0000 mg | ORAL_TABLET | Freq: Every day | ORAL | Status: DC
  Administered 2021-11-16 – 2021-11-17 (×2): 10 mg via ORAL
  Filled 2021-11-15 (×2): qty 1

## 2021-11-15 MED ORDER — HYDROCODONE-ACETAMINOPHEN 5-325 MG PO TABS: 1.0000 | ORAL_TABLET | Freq: Four times a day (QID) | ORAL | Status: DC | PRN

## 2021-11-15 MED ORDER — ATORVASTATIN CALCIUM 40 MG PO TABS
40.0000 mg | ORAL_TABLET | Freq: Every day | ORAL | Status: DC
  Administered 2021-11-15 – 2021-11-17 (×3): 40 mg via ORAL
  Filled 2021-11-15 (×3): qty 1

## 2021-11-15 MED ORDER — ACETAMINOPHEN 325 MG PO TABS: 650.0000 mg | ORAL_TABLET | Freq: Four times a day (QID) | ORAL | Status: DC | PRN

## 2021-11-15 MED ORDER — ONDANSETRON HCL 4 MG PO TABS: 4.0000 mg | ORAL_TABLET | Freq: Four times a day (QID) | ORAL | Status: DC | PRN

## 2021-11-15 MED ORDER — ACTEMRA ACTPEN 162 MG/0.9ML ~~LOC~~ SOAJ: 162.0000 mg | SUBCUTANEOUS | 0 refills | Status: AC

## 2021-11-15 MED ORDER — AMLODIPINE BESYLATE 5 MG PO TABS
5.0000 mg | ORAL_TABLET | Freq: Every day | ORAL | Status: DC
  Administered 2021-11-16: 5 mg via ORAL
  Filled 2021-11-15 (×2): qty 1

## 2021-11-15 MED ORDER — PREDNISONE 10 MG PO TABS
5.0000 mg | ORAL_TABLET | Freq: Every day | ORAL | Status: DC
  Administered 2021-11-16 – 2021-11-17 (×2): 5 mg via ORAL
  Filled 2021-11-15 (×2): qty 1

## 2021-11-15 MED ORDER — IOHEXOL 300 MG/ML  SOLN
100.0000 mL | Freq: Once | INTRAMUSCULAR | Status: AC | PRN
  Administered 2021-11-15: 75 mL via INTRAVENOUS

## 2021-11-15 MED ORDER — METOPROLOL SUCCINATE ER 25 MG PO TB24
25.0000 mg | ORAL_TABLET | Freq: Every day | ORAL | Status: DC
  Administered 2021-11-16 – 2021-11-17 (×2): 25 mg via ORAL
  Filled 2021-11-15 (×2): qty 1

## 2021-11-15 MED ORDER — MELATONIN 3 MG PO TABS
5.0000 mg | ORAL_TABLET | Freq: Every day | ORAL | Status: DC
  Administered 2021-11-15 – 2021-11-16 (×2): 4.5 mg via ORAL
  Filled 2021-11-15 (×2): qty 2

## 2021-11-15 MED ORDER — ACETAMINOPHEN 650 MG RE SUPP: 650.0000 mg | Freq: Four times a day (QID) | RECTAL | Status: DC | PRN

## 2021-11-15 MED ORDER — HYDROMORPHONE HCL 1 MG/ML IJ SOLN
0.5000 mg | Freq: Once | INTRAMUSCULAR | Status: AC
  Administered 2021-11-15: 0.5 mg via INTRAVENOUS
  Filled 2021-11-15: qty 0.5

## 2021-11-15 NOTE — ED Notes (Signed)
Patient transported to CT 

## 2021-11-15 NOTE — Assessment & Plan Note (Signed)
Systolic 122E to 497. -Resume Norvasc -Hold losartan for contrast exposure

## 2021-11-15 NOTE — ED Provider Notes (Signed)
[facility] EMERGENCY DEPARTMENT Provider Note   CSN: [id number] Arrival date & time: 11/15/21  1039     History  Chief Complaint  Patient presents with   Lytle Michaels    Jenna Long is a 87 y.o. female.  HPI   Patient with medical history including diabetes, hypertension, CVA, giant cell temporal arthritis, legally blind presents to the emergency room after a fall.  Patient states that she was attempting to go to the bathroom and fell onto her face.  She states that she has not sure why she fell she states that she woke up with her daughter by her side home for up, she is endorsing mainly right-sided rib pain with no other complaints, she is not endorsing any headaches neck pain back pain shortness of breath pain in her upper or lower extremities no pain in her hips or legs.  Patient states prior to the incident she was not having lightheaded or dizziness no chest pain or shortness of breath, states that she had not begun to have a bowel movement or urinates.  Patient has not on any anticoag's,  Patient's daughter is at bedside states that she helped her up to go to the bathroom and as she turned away to grab her close she heard her fall to the ground, she states that the patient has difficulty with her gait, this has been going on for a while, she states she walks with a walker, she states that likely patient lost her balance causing her fall forward as she generally leans heavily forward.    Home Medications Prior to Admission medications   Medication Sig Start Date End Date Taking? Authorizing Provider  acetaminophen (TYLENOL) 325 MG tablet Take 325-650 mg by mouth every 6 (six) hours as needed for moderate pain or headache.   Yes [provider]  amLODipine (NORVASC) 10 MG tablet Take 1 tablet (10 mg total) by mouth daily. Patient taking differently: Take 5 mg by mouth daily. 11/25/20  Yes Angiulli, Lavon Paganini, PA-C  atorvastatin (LIPITOR) 40 MG tablet Take 1 tablet (40 mg total)  by mouth daily. 11/24/20  Yes Angiulli, Lavon Paganini, PA-C  clopidogrel (PLAVIX) 75 MG tablet To be restarted in 1 month when course of Aspirin and Brillinta has completed 01/16/21  Yes Domenic Polite, MD  donepezil (ARICEPT) 5 MG tablet Take 5 mg by mouth at bedtime.   Yes [provider]  dorzolamide (TRUSOPT) 2 % ophthalmic solution Place 1 drop into both eyes 2 (two) times daily. 11/24/20  Yes Angiulli, Lavon Paganini, PA-C  escitalopram (LEXAPRO) 10 MG tablet Take 10 mg by mouth daily.   Yes [provider]  losartan (COZAAR) 25 MG tablet Take 25 mg by mouth daily. 02/23/21  Yes [provider]  melatonin 5 MG TABS Take 5 mg by mouth at bedtime.   Yes [provider]  metFORMIN (GLUCOPHAGE) 1000 MG tablet Take 500 mg by mouth 2 (two) times daily with a meal.   Yes [provider]  metoprolol succinate (TOPROL-XL) 25 MG 24 hr tablet Take 1 tablet (25 mg total) by mouth daily. 12/16/20  Yes Domenic Polite, MD  Multiple Vitamin (MULTIVITAMIN) tablet Take 1 tablet by mouth daily.   Yes [provider]  predniSONE (DELTASONE) 1 MG tablet Take in combination with 5 mg tablets for total daily dose as directed Patient taking differently: Take 5 mg by mouth daily with breakfast. 10/02/21  Yes Rice, Resa Miner, MD  escitalopram (LEXAPRO) 5 MG tablet  Take 5 mg by mouth daily. Patient not taking: Reported on 06/22/2021 01/30/21   [provider]  insulin glargine (LANTUS) 100 UNIT/ML Solostar Pen Inject 12 Units into the skin daily. Patient not taking: Reported on 06/22/2021 12/16/20   Domenic Polite, MD  Insulin Pen Needle (PEN NEEDLES 3/16") 31G X 5 MM MISC 12 Units by Does not apply route daily. Patient not taking: Reported on 06/22/2021 12/16/20   Domenic Polite, MD  predniSONE (DELTASONE) 5 MG tablet Take in combination with 1 mg tablets for total daily dose as directed Patient not taking: Reported on 11/15/2021 10/02/21   Collier Salina, MD   sitaGLIPtin (JANUVIA) 100 MG tablet Take 100 mg by mouth daily. Patient not taking: Reported on 11/15/2021    [provider]  Tocilizumab (ACTEMRA ACTPEN) 162 MG/0.9ML SOAJ Inject 162 mg into the skin once a week. 11/15/21   Rice, Resa Miner, MD      Allergies    Patient has no known allergies.    Review of Systems   Review of Systems  Constitutional:  Negative for chills and fever.  Respiratory:  Negative for shortness of breath.   Cardiovascular:  Negative for chest pain.  Gastrointestinal:  Negative for abdominal pain.  Musculoskeletal:        Right-sided rib pain  Neurological:  Negative for headaches.    Physical Exam Updated Vital Signs BP (!) 175/78   Pulse 69   Temp 98 F (36.7 C) (Oral)   Resp (!) 22   Ht '5\' 7"'$  (1.702 m)   Wt 60.8 kg   SpO2 98%   BMI 20.99 kg/m  Physical Exam Vitals and nursing note reviewed.  Constitutional:      General: She is not in acute distress.    Appearance: She is not ill-appearing.  HENT:     Head: Normocephalic and atraumatic.     Comments: There is no deformity of the head present no raccoon eyes or battle sign noted.    Nose: No congestion.     Mouth/Throat:     Mouth: Mucous membranes are moist.     Pharynx: Oropharynx is clear. No oropharyngeal exudate or posterior oropharyngeal erythema.     Comments: No trismus no torticollis no oral trauma Eyes:     Conjunctiva/sclera: Conjunctivae normal.  Cardiovascular:     Rate and Rhythm: Normal rate and regular rhythm.     Pulses: Normal pulses.     Heart sounds: No murmur heard.    No friction rub. No gallop.  Pulmonary:     Effort: No respiratory distress.     Breath sounds: No wheezing, rhonchi or rales.     Comments: Chest was palpated she had notable tenderness along the mid axillary right side along ribs 5 and 6, no crepitus or deformities noted. Abdominal:     Palpations: Abdomen is soft.     Tenderness: There is no abdominal tenderness. There is no right  CVA tenderness or left CVA tenderness.  Musculoskeletal:     Comments: Spine was palpated was nontender to palpation no step-off or deformities noted, no pelvis instability no leg shortening, moving her upper and lower extremities without difficulty.  Skin:    General: Skin is warm and dry.  Neurological:     Mental Status: She is alert.     Comments: No facial asymmetry no difficulty with word finding following two-step commands no unilateral weakness present.  Psychiatric:        Mood and Affect: Mood  normal.     ED Results / Procedures / Treatments   Labs (all labs ordered are listed, but only abnormal results are displayed) Labs Reviewed  BASIC METABOLIC PANEL - Abnormal; Notable for the following components:      Result Value   Glucose, Bld 223 (*)    All other components within normal limits  CBC WITH DIFFERENTIAL/PLATELET - Abnormal; Notable for the following components:   RBC 3.79 (*)    MCH 34.6 (*)    Lymphs Abs 0.4 (*)    All other components within normal limits  CBC WITH DIFFERENTIAL/PLATELET  URINALYSIS, ROUTINE W REFLEX MICROSCOPIC    EKG None  Radiology CT Chest W Contrast  Result Date: 11/15/2021 CLINICAL DATA:  Chest trauma EXAM: CT CHEST WITH CONTRAST TECHNIQUE: Multidetector CT imaging of the chest was performed during intravenous contrast administration. RADIATION DOSE REDUCTION: This exam was performed according to the departmental dose-optimization program which includes automated exposure control, adjustment of the mA and/or kV according to patient size and/or use of iterative reconstruction technique. CONTRAST:  15m OMNIPAQUE IOHEXOL 300 MG/ML  SOLN COMPARISON:  Same day rib radiograph FINDINGS: Cardiovascular: Normal heart size. No pericardial effusion. Aortic valve calcifications. Normal caliber thoracic aorta with moderate atherosclerotic disease. Suspicious filling defects of the central pulmonary arteries. Moderate three-vessel coronary artery  calcifications. Mediastinum/Nodes: Esophagus is unremarkable. Exophytic nodule of the right lobe of the thyroid measuring 2.0 cm. No pathologically enlarged lymph nodes seen in the chest. Lungs/Pleura: Central airways are patent. No consolidation or pneumothorax. Trace right pleural effusion. Upper Abdomen: No acute abnormality. Musculoskeletal: Mildly displaced fractures of the posterior right 10th and 11th ribs and nondisplaced fractures of the right lateral 7th-9th ribs. IMPRESSION: 1. No evidence of pneumothorax. 2. Trace right pleural effusion, suspicious for hemothorax given associated rib fractures. 3. Mildly displaced fractures of the posterior right 10th and 11th ribs and nondisplaced fractures of the right lateral 7th-9th ribs. 4. Right thyroid nodule measuring 2.0 cm. Recommend nonemergent thyroid [place] clinically warranted given patient age. Reference: J Am Coll Radiol. 2015 Feb;12(2): 143-50 5. Aortic valve calcifications, which can be seen in the setting of aortic stenosis or sclerosis. Correlate with echocardiography. 6. Moderate three-vessel coronary artery calcifications. 7.  Aortic Atherosclerosis (ICD10-I70.0). Electronically Signed   By: LYetta GlassmanM.D.   On: 11/15/2021 14:56   DG Ribs Unilateral W/Chest Right  Result Date: 11/15/2021 CLINICAL DATA:  Fall.  Right rib pain EXAM: RIGHT RIBS AND CHEST - 3+ VIEW COMPARISON:  Chest 03/24/2021 FINDINGS: Mild cardiac enlargement. Vascularity normal. Atherosclerotic calcification aortic arch Lungs clear without infiltrate or effusion.  No pneumothorax Nondisplaced fractures right seventh, eighth, ninth ribs IMPRESSION: Nondisplaced fractures right seventh, eighth, ninth ribs No acute cardiopulmonary abnormality. Electronically Signed   By: CFranchot GalloM.D.   On: 11/15/2021 12:59   CT Head Wo Contrast  Result Date: 11/15/2021 CLINICAL DATA:  Fall.  Head trauma. EXAM: CT HEAD WITHOUT CONTRAST CT CERVICAL SPINE WITHOUT CONTRAST TECHNIQUE:  Multidetector CT imaging of the head and cervical spine was performed following the standard protocol without intravenous contrast. Multiplanar CT image reconstructions of the cervical spine were also generated. RADIATION DOSE REDUCTION: This exam was performed according to the departmental dose-optimization program which includes automated exposure control, adjustment of the mA and/or kV according to patient size and/or use of iterative reconstruction technique. COMPARISON:  None Available. FINDINGS: CT HEAD FINDINGS Brain: No evidence of acute infarction, hemorrhage, hydrocephalus, extra-axial collection or mass lesion/mass effect. There is mild  diffuse low-attenuation within the subcortical and periventricular white matter compatible with chronic microvascular disease. Prominence of the sulci and ventricles compatible with atrophy. Vascular: No hyperdense vessel or unexpected calcification. Skull: Normal. Negative for fracture or focal lesion. Sinuses/Orbits: Paranasal sinuses and mastoid air cells are clear. Other: None. CT CERVICAL SPINE FINDINGS Alignment: Normal alignment. Skull base and vertebrae: No acute fracture. No primary bone lesion or focal pathologic process. Soft tissues and spinal canal: No prevertebral fluid or swelling. No visible canal hematoma. Disc levels:  Multilevel degenerative disc disease. Upper chest: Negative. Other: None IMPRESSION: 1. No acute intracranial abnormality. 2. Chronic microvascular disease and atrophy. 3. No evidence for cervical spine fracture or subluxation. 4. Multilevel degenerative disc disease. Electronically Signed   By: Kerby Moors M.D.   On: 11/15/2021 12:57   CT Cervical Spine Wo Contrast  Result Date: 11/15/2021 CLINICAL DATA:  Fall.  Head trauma. EXAM: CT HEAD WITHOUT CONTRAST CT CERVICAL SPINE WITHOUT CONTRAST TECHNIQUE: Multidetector CT imaging of the head and cervical spine was performed following the standard protocol without intravenous contrast.  Multiplanar CT image reconstructions of the cervical spine were also generated. RADIATION DOSE REDUCTION: This exam was performed according to the departmental dose-optimization program which includes automated exposure control, adjustment of the mA and/or kV according to patient size and/or use of iterative reconstruction technique. COMPARISON:  None Available. FINDINGS: CT HEAD FINDINGS Brain: No evidence of acute infarction, hemorrhage, hydrocephalus, extra-axial collection or mass lesion/mass effect. There is mild diffuse low-attenuation within the subcortical and periventricular white matter compatible with chronic microvascular disease. Prominence of the sulci and ventricles compatible with atrophy. Vascular: No hyperdense vessel or unexpected calcification. Skull: Normal. Negative for fracture or focal lesion. Sinuses/Orbits: Paranasal sinuses and mastoid air cells are clear. Other: None. CT CERVICAL SPINE FINDINGS Alignment: Normal alignment. Skull base and vertebrae: No acute fracture. No primary bone lesion or focal pathologic process. Soft tissues and spinal canal: No prevertebral fluid or swelling. No visible canal hematoma. Disc levels:  Multilevel degenerative disc disease. Upper chest: Negative. Other: None IMPRESSION: 1. No acute intracranial abnormality. 2. Chronic microvascular disease and atrophy. 3. No evidence for cervical spine fracture or subluxation. 4. Multilevel degenerative disc disease. Electronically Signed   By: Kerby Moors M.D.   On: 11/15/2021 12:57    Procedures Procedures    Medications Ordered in ED Medications  iohexol (OMNIPAQUE) 300 MG/ML solution 100 mL (75 mLs Intravenous Contrast Given 11/15/21 1440)  HYDROmorphone (DILAUDID) injection 0.5 mg (0.5 mg Intravenous Given 11/15/21 1451)  ondansetron (ZOFRAN) injection 4 mg (4 mg Intravenous Given 11/15/21 1450)    ED Course/ Medical Decision Making/ A&P                           Medical Decision Making Amount  and/or Complexity of Data Reviewed Labs: ordered. Radiology: ordered.  Risk Prescription drug management. Decision regarding hospitalization.   This patient presents to the ED for concern of fall, this involves an extensive number of treatment options, and is a complaint that carries with it a high risk of complications and morbidity.  The differential diagnosis includes intracranial head bleed, internal trauma, infection, metabolic derailments    Additional history obtained:  Additional history obtained from daughter at bedside External records from outside source obtained and reviewed including rheumatoid arthritis notes   Co morbidities that complicate the patient evaluation  Hypertension, diabetes, legally blind  Social Determinants of Health:  N/A    Lab  Tests:  I Ordered, and personally interpreted labs.  The pertinent results include: BMP shows glucose of 223, CBC is unremarkable, UA is pending   Imaging Studies ordered:  I ordered imaging studies including x-ray of ribs, CT head, C-spine, CT chest with contrast I independently visualized and interpreted imaging which showed chest x-ray reveals multiple right-sided rib fractures, CT head C-spine both negative acute findings, CT chest reveals trace right pleural effusion possible hemothorax, mildly displaced fracture of the posterior right rib 10 and 11, nondisplaced ribs 7,8,9. I agree with the radiologist interpretation   Cardiac Monitoring:  The patient was maintained on a cardiac monitor.  I personally viewed and interpreted the cardiac monitored which showed an underlying rhythm of: EKG without signs of ischemia   Medicines ordered and prescription drug management:  I ordered medication including N/A I have reviewed the patients home medicines and have made adjustments as needed  Critical Interventions:  N/A   Reevaluation:  Presents after a fall, tenderness along her right ribs, will obtain  imaging of her head neck as well as her ribs, obtain basic lab work-up for further evaluation  Chest x-ray reveals 3 nondisplaced rib fractures, due to the extensive we will follow-up with CT chest with contrast for further rule out of intrathoracic trauma.  CT scan reveals 5 rib fractures, would recommend admission for further observation primarily for pain management patient agreed this plan will consult hospitalist team.   Consultations Obtained:  I requested consultation with the trauma Dr. Bobbye Morton,  and discussed lab and imaging findings as well as pertinent plan - they recommend: Patient can remain at [facility]. Spoke with Dr. Su Ley will admit the patient    Test Considered:  CT abdomen pelvis deferred as my suspicion for intra-abdominal trauma is low at this time abdomen soft nontender no evidence of trauma present on exam.    Rule out low suspicion for CVA or intracranial head bleed as patient denies change in vision, paresthesias or weakness to upper lower extremities, no neuro deficits noted on exam, CT head did not reveal any acute findings.  Low suspicion for ACS or arrhythmias as patient denies chest pain, shortness of breath, no hypoperfusion or fluid overload on exam, EKG sinus without signs of ischemia.  Low suspicion for seizures presentation is atypical no postictal state no general body shaking no urinary incontinence Or tongue biting.  Low suspicion for spinal cord abnormality or spinal fracture spine was palpated was nontender to palpation, patient has full range of motion in the upper and lower extremities. low Suspicion for hypertensive emergency  no evidence of organ damage noted within lab work or on exam, suspect slightly elevated due to pain from 5 rib fractures.  Will need continued monitoring.     Dispostion and problem list  After consideration of the diagnostic results and the patients response to treatment, I feel that the patent would benefit from  admission.  Rib fractures-patient will likely need continued monitoring for pain management as well as possible hypoxia due to pain.            Final Clinical Impression(s) / ED Diagnoses Final diagnoses:  Fall, initial encounter  Closed fracture of multiple ribs of right side, initial encounter    Rx / DC Orders ED Discharge Orders     None         Marcello Fennel, PA-C 11/15/21 1653    Audley Hose, MD 11/21/21 [phone]

## 2021-11-15 NOTE — Assessment & Plan Note (Addendum)
Status post mechanical fall.  Patient is blind, attempting to change her clothing, and was not using her walker when she fell.  Head and cervical CT without acute abnormality.  Chest CT shows nondisplaced fracture of seventh or ninth rib, nondisplaced fracture of the 10th and 11th rib, also trace right pleural effusion suspicious for hemothorax. -O2 sats greater than 96% on room air. -Incentive spirometry -Hydrocodone/acetaminophen 5/325 every 6 hours as needed for pain - PT eval - Hold Plavix

## 2021-11-15 NOTE — Assessment & Plan Note (Addendum)
-   HgbA1c - SSI- S -Hold sitagliptin and metformin

## 2021-11-15 NOTE — Addendum Note (Signed)
Addended by: Carole Binning on: 11/15/2021 02:14 PM   Modules accepted: Orders

## 2021-11-15 NOTE — H&P (Addendum)
History and Physical    Jenna Long KPV:[id number] DOB: 28-Feb-1934 DOA: 11/15/2021  PCP: Sharilyn Sites, MD   Patient coming from: Home  I have personally briefly reviewed patient's old medical records in [place]  Chief Complaint: Fall  HPI: Jenna Long is a 87 y.o. female with medical history significant for diabetes mellitus, stroke, hypertension, diastolic CHF, blindness, temporal arteritis and PMR. Patient was brought to the ED with reports of a fall.  At the time of my evaluation, family is not at bedside, patient is awake alert able to answer most questions.  Patient is blind.  She does not know exactly why she fell, but she feels she lost her balance.  She reports she is asleep walker at baseline.  At the time she felt she was not using her walker, she was trying to get her clothes on.  Daughter was present when patient fell.  Patient's daughter reported to ED provider that she helped the patient to go to the bathroom, she turned away to grab her clothes and she had patient fall.  She reports patient has ongoing gait abnormality, and leans heavily forward when walking and feels patient lost her balance. Patient denies chest pain, no difficulty breathing.  ED Course: Blood pressure systolic ranging from 1 67-5 99.  O2 sats 96 to 100% on room air.    CT chest shows nondisplaced fracture of the seventh through ninth ribs, and displaced fracture of the 10th-11th rib.  Also trace right pleural effusion concerning for hemothorax. Head and cervical CT without acute abnormality,. UA with positive nitrites. EDP trauma surgery, okay to admit here.  Review of Systems: As per HPI all other systems reviewed and negative.  Past Medical History:  Diagnosis Date   Body mass index (BMI) 21.0-21.9, adult    Congestive heart failure (CHF) (HCC)    Diabetes mellitus type II    DM (diabetes mellitus) ([place])    DNR (do not resuscitate) 11/08/2020   Hypercholesteremia    Hyperlipidemia     Hypertension    Osteoarthritis    Overweight    Polymyalgia rheumatica ([place])    [place]ela of cerebrovascular accident    Temporal arteritis ([place])     Past Surgical History:  Procedure Laterality Date   ABDOMINAL HYSTERECTOMY  1991   [facility], [place]   left, [place]   ARTERY BIOPSY Right 11/09/2020   Procedure: BIOPSY TEMPORAL ARTERY;  Surgeon: Cherre Robins, MD;  Location: [facility] OR;  Service: Vascular;  Laterality: Right;   CATARACT EXTRACTION W/ INTRAOCULAR LENS IMPLANT  06/10/06    Right, [facility], [place]   CATARACT EXTRACTION W/PHACO  09/18/2010   Procedure: CATARACT EXTRACTION PHACO AND INTRAOCULAR LENS PLACEMENT (Show Low);  Surgeon: Williams Che;  Location: [facility];  Service: Ophthalmology;  Laterality: Left;   COLONOSCOPY W/ POLYPECTOMY  06/20/10   [facility], [place]   HIP ARTHROPLASTY Left 02/18/2020   Procedure: ARTHROPLASTY BIPOLAR HIP (HEMIARTHROPLASTY);  Surgeon: Mordecai Rasmussen, MD;  Location: [facility] ORS;  Service: Orthopedics;  Laterality: Left;     reports that she has never smoked. She has never been exposed to tobacco smoke. She has never used smokeless tobacco. She reports that she does not drink alcohol and does not use drugs.  No Known Allergies  Family History  Problem Relation Age of Onset   Stroke Mother    Heart disease Father    Cancer Sister    Anesthesia problems Neg Hx  Prior to Admission medications   Medication Sig Start Date End Date Taking? Authorizing Provider  acetaminophen (TYLENOL) 325 MG tablet Take 325-650 mg by mouth every 6 (six) hours as needed for moderate pain or headache.   Yes [provider]  amLODipine (NORVASC) 10 MG tablet Take 1 tablet (10 mg total) by mouth daily. Patient taking differently: Take 5 mg by mouth daily. 11/25/20  Yes Angiulli, Lavon Paganini, PA-C  atorvastatin (LIPITOR) 40 MG tablet Take 1 tablet (40 mg total) by mouth daily. 11/24/20  Yes Angiulli, Lavon Paganini, PA-C  clopidogrel (PLAVIX) 75 MG tablet To be  restarted in 1 month when course of Aspirin and Brillinta has completed 01/16/21  Yes Domenic Polite, MD  donepezil (ARICEPT) 5 MG tablet Take 5 mg by mouth at bedtime.   Yes [provider]  dorzolamide (TRUSOPT) 2 % ophthalmic solution Place 1 drop into both eyes 2 (two) times daily. 11/24/20  Yes Angiulli, Lavon Paganini, PA-C  escitalopram (LEXAPRO) 10 MG tablet Take 10 mg by mouth daily.   Yes [provider]  losartan (COZAAR) 25 MG tablet Take 25 mg by mouth daily. 02/23/21  Yes [provider]  melatonin 5 MG TABS Take 5 mg by mouth at bedtime.   Yes [provider]  metFORMIN (GLUCOPHAGE) 1000 MG tablet Take 500 mg by mouth 2 (two) times daily with a meal.   Yes [provider]  metoprolol succinate (TOPROL-XL) 25 MG 24 hr tablet Take 1 tablet (25 mg total) by mouth daily. 12/16/20  Yes Domenic Polite, MD  Multiple Vitamin (MULTIVITAMIN) tablet Take 1 tablet by mouth daily.   Yes [provider]  predniSONE (DELTASONE) 1 MG tablet Take in combination with 5 mg tablets for total daily dose as directed Patient taking differently: Take 5 mg by mouth daily with breakfast. 10/02/21  Yes Rice, Resa Miner, MD  escitalopram (LEXAPRO) 5 MG tablet Take 5 mg by mouth daily. Patient not taking: Reported on 06/22/2021 01/30/21   [provider]  insulin glargine (LANTUS) 100 UNIT/ML Solostar Pen Inject 12 Units into the skin daily. Patient not taking: Reported on 06/22/2021 12/16/20   Domenic Polite, MD  Insulin Pen Needle (PEN NEEDLES 3/16") 31G X 5 MM MISC 12 Units by Does not apply route daily. Patient not taking: Reported on 06/22/2021 12/16/20   Domenic Polite, MD  predniSONE (DELTASONE) 5 MG tablet Take in combination with 1 mg tablets for total daily dose as directed Patient not taking: Reported on 11/15/2021 10/02/21   Collier Salina, MD  sitaGLIPtin (JANUVIA) 100 MG tablet Take 100 mg by mouth daily. Patient not taking: Reported on  11/15/2021    [provider]  Tocilizumab (ACTEMRA ACTPEN) 162 MG/0.9ML SOAJ Inject 162 mg into the skin once a week. 11/15/21   Collier Salina, MD    Physical Exam: Vitals:   11/15/21 1430 11/15/21 1500 11/15/21 1530 11/15/21 1600  BP: (!) 177/152 128/73 (!) 137/59 (!) 175/78  Pulse:  72  69  Resp: '19 18 13 '$ (!) 22  Temp:      TempSrc:      SpO2: 100% 100%  98%  Weight:      Height:        Constitutional: NAD, calm, comfortable Vitals:   11/15/21 1430 11/15/21 1500 11/15/21 1530 11/15/21 1600  BP: (!) 177/152 128/73 (!) 137/59 (!) 175/78  Pulse:  72  69  Resp: '19 18 13 '$ (!) 22  Temp:  TempSrc:      SpO2: 100% 100%  98%  Weight:      Height:       Eyes: blind, lids and conjunctivae normal ENMT: Mucous membranes are moist.  Neck: normal, supple, no masses, no thyromegaly Respiratory:  Normal respiratory effort. No accessory muscle use.  Cardiovascular: Regular rate and rhythm, no murmurs / rubs / gallops. No extremity edema.  Abdomen: no tenderness, no masses palpated. No hepatosplenomegaly. Bowel sounds positive.  Musculoskeletal: no clubbing / cyanosis. No joint deformity upper and lower extremities.  Skin: no rashes, lesions, ulcers. No induration Neurologic: No facial asymmetry, speech clear, moving extremities spontaneously, able to move bilateral lower extremities against gravity Psychiatric: Normal judgment and insight. Alert and oriented x 3. Normal mood.   Labs on Admission: I have personally reviewed following labs and imaging studies  CBC: Recent Labs  Lab 11/15/21 1335  WBC 5.4  NEUTROABS 4.7  HGB 13.1  HCT 37.3  MCV 98.4  PLT 938   Basic Metabolic Panel: Recent Labs  Lab 11/15/21 1150  NA 139  K 4.7  CL 104  CO2 24  GLUCOSE 223*  BUN 19  CREATININE 0.85  CALCIUM 9.3   Urine analysis:    Component Value Date/Time   COLORURINE YELLOW 11/15/2021 [place] 11/15/2021 1540   LABSPEC 1.011 11/15/2021 1540    PHURINE 7.0 11/15/2021 1540   GLUCOSEU NEGATIVE 11/15/2021 1540   HGBUR NEGATIVE 11/15/2021 1540   BILIRUBINUR NEGATIVE 11/15/2021 1540   KETONESUR 5 (A) 11/15/2021 1540   PROTEINUR NEGATIVE 11/15/2021 1540   NITRITE POSITIVE (A) 11/15/2021 1540   LEUKOCYTESUR NEGATIVE 11/15/2021 1540    Radiological Exams on Admission: CT Chest W Contrast  Result Date: 11/15/2021 CLINICAL DATA:  Chest trauma EXAM: CT CHEST WITH CONTRAST TECHNIQUE: Multidetector CT imaging of the chest was performed during intravenous contrast administration. RADIATION DOSE REDUCTION: This exam was performed according to the departmental dose-optimization program which includes automated exposure control, adjustment of the mA and/or kV according to patient size and/or use of iterative reconstruction technique. CONTRAST:  70m OMNIPAQUE IOHEXOL 300 MG/ML  SOLN COMPARISON:  Same day rib radiograph FINDINGS: Cardiovascular: Normal heart size. No pericardial effusion. Aortic valve calcifications. Normal caliber thoracic aorta with moderate atherosclerotic disease. Suspicious filling defects of the central pulmonary arteries. Moderate three-vessel coronary artery calcifications. Mediastinum/Nodes: Esophagus is unremarkable. Exophytic nodule of the right lobe of the thyroid measuring 2.0 cm. No pathologically enlarged lymph nodes seen in the chest. Lungs/Pleura: Central airways are patent. No consolidation or pneumothorax. Trace right pleural effusion. Upper Abdomen: No acute abnormality. Musculoskeletal: Mildly displaced fractures of the posterior right 10th and 11th ribs and nondisplaced fractures of the right lateral 7th-9th ribs. IMPRESSION: 1. No evidence of pneumothorax. 2. Trace right pleural effusion, suspicious for hemothorax given associated rib fractures. 3. Mildly displaced fractures of the posterior right 10th and 11th ribs and nondisplaced fractures of the right lateral 7th-9th ribs. 4. Right thyroid nodule measuring 2.0 cm.  Recommend nonemergent thyroid [place] clinically warranted given patient age. Reference: J Am Coll Radiol. 2015 Feb;12(2): 143-50 5. Aortic valve calcifications, which can be seen in the setting of aortic stenosis or sclerosis. Correlate with echocardiography. 6. Moderate three-vessel coronary artery calcifications. 7.  Aortic Atherosclerosis (ICD10-I70.0). Electronically Signed   By: LYetta GlassmanM.D.   On: 11/15/2021 14:56   DG Ribs Unilateral W/Chest Right  Result Date: 11/15/2021 CLINICAL DATA:  Fall.  Right rib pain EXAM: RIGHT RIBS AND CHEST -  3+ VIEW COMPARISON:  Chest 03/24/2021 FINDINGS: Mild cardiac enlargement. Vascularity normal. Atherosclerotic calcification aortic arch Lungs clear without infiltrate or effusion.  No pneumothorax Nondisplaced fractures right seventh, eighth, ninth ribs IMPRESSION: Nondisplaced fractures right seventh, eighth, ninth ribs No acute cardiopulmonary abnormality. Electronically Signed   By: Franchot Gallo M.D.   On: 11/15/2021 12:59   CT Head Wo Contrast  Result Date: 11/15/2021 CLINICAL DATA:  Fall.  Head trauma. EXAM: CT HEAD WITHOUT CONTRAST CT CERVICAL SPINE WITHOUT CONTRAST TECHNIQUE: Multidetector CT imaging of the head and cervical spine was performed following the standard protocol without intravenous contrast. Multiplanar CT image reconstructions of the cervical spine were also generated. RADIATION DOSE REDUCTION: This exam was performed according to the departmental dose-optimization program which includes automated exposure control, adjustment of the mA and/or kV according to patient size and/or use of iterative reconstruction technique. COMPARISON:  None Available. FINDINGS: CT HEAD FINDINGS Brain: No evidence of acute infarction, hemorrhage, hydrocephalus, extra-axial collection or mass lesion/mass effect. There is mild diffuse low-attenuation within the subcortical and periventricular white matter compatible with chronic microvascular disease.  Prominence of the sulci and ventricles compatible with atrophy. Vascular: No hyperdense vessel or unexpected calcification. Skull: Normal. Negative for fracture or focal lesion. Sinuses/Orbits: Paranasal sinuses and mastoid air cells are clear. Other: None. CT CERVICAL SPINE FINDINGS Alignment: Normal alignment. Skull base and vertebrae: No acute fracture. No primary bone lesion or focal pathologic process. Soft tissues and spinal canal: No prevertebral fluid or swelling. No visible canal hematoma. Disc levels:  Multilevel degenerative disc disease. Upper chest: Negative. Other: None IMPRESSION: 1. No acute intracranial abnormality. 2. Chronic microvascular disease and atrophy. 3. No evidence for cervical spine fracture or subluxation. 4. Multilevel degenerative disc disease. Electronically Signed   By: Kerby Moors M.D.   On: 11/15/2021 12:57   CT Cervical Spine Wo Contrast  Result Date: 11/15/2021 CLINICAL DATA:  Fall.  Head trauma. EXAM: CT HEAD WITHOUT CONTRAST CT CERVICAL SPINE WITHOUT CONTRAST TECHNIQUE: Multidetector CT imaging of the head and cervical spine was performed following the standard protocol without intravenous contrast. Multiplanar CT image reconstructions of the cervical spine were also generated. RADIATION DOSE REDUCTION: This exam was performed according to the departmental dose-optimization program which includes automated exposure control, adjustment of the mA and/or kV according to patient size and/or use of iterative reconstruction technique. COMPARISON:  None Available. FINDINGS: CT HEAD FINDINGS Brain: No evidence of acute infarction, hemorrhage, hydrocephalus, extra-axial collection or mass lesion/mass effect. There is mild diffuse low-attenuation within the subcortical and periventricular white matter compatible with chronic microvascular disease. Prominence of the sulci and ventricles compatible with atrophy. Vascular: No hyperdense vessel or unexpected calcification. Skull:  Normal. Negative for fracture or focal lesion. Sinuses/Orbits: Paranasal sinuses and mastoid air cells are clear. Other: None. CT CERVICAL SPINE FINDINGS Alignment: Normal alignment. Skull base and vertebrae: No acute fracture. No primary bone lesion or focal pathologic process. Soft tissues and spinal canal: No prevertebral fluid or swelling. No visible canal hematoma. Disc levels:  Multilevel degenerative disc disease. Upper chest: Negative. Other: None IMPRESSION: 1. No acute intracranial abnormality. 2. Chronic microvascular disease and atrophy. 3. No evidence for cervical spine fracture or subluxation. 4. Multilevel degenerative disc disease. Electronically Signed   By: Kerby Moors M.D.   On: 11/15/2021 12:57    EKG: Independently reviewed.  Sinus rhythm rate 60, QTc 439.  No significant change from prior.  Assessment/Plan Principal Problem:   Multiple rib fractures Active Problems:   Chronic  diastolic CHF (congestive heart failure) (HCC)   Essential hypertension   Fall   Temporal arteritis (HCC)   Type 2 diabetes mellitus with hyperglycemia, with long-term current use of insulin (HCC)   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 (HCC)   Assessment and Plan: * Multiple rib fractures Status post mechanical fall.  Patient is blind, attempting to change her clothing, and was not using her walker when she fell.  Head and cervical CT without acute abnormality.  Chest CT shows nondisplaced fracture of seventh or ninth rib, nondisplaced fracture of the 10th and 11th rib, also trace right pleural effusion suspicious for hemothorax. -O2 sats greater than 96% on room air. -Incentive spirometry -Hydrocodone/acetaminophen 5/325 every 6 hours as needed for pain - PT eval - Hold Plavix   Chronic diastolic CHF (congestive heart failure) (HCC) Stable and compensated.  Not on diuretics.  Last echo 11/2020, EF of 65 to 70% with grade 1 DD.  Stroke due to occlusion of left anterior  cerebral artery [facility]) Patient is blind.  -Hold Plavix for now with possible hemothorax, -Resume statin,  Type 2 diabetes mellitus with hyperglycemia, with long-term current use of insulin (HCC) - HgbA1c - SSI- S -Hold sitagliptin and metformin  Temporal arteritis (HCC) History of temporal arteritis and PMR.  She is on Actemra injections once a week. -Resume steroids  Essential hypertension Systolic 782N to 562. -Resume Norvasc -Hold losartan for contrast exposure   DVT prophylaxis: SCDS Code Status: FULL-prior documentation states otherwise, patient is unable to confirm to me her CODE STATUS.  Family Communication: None at bedside Disposition Plan: ~ 1 - 2 days Consults called: None Admission status:  Obs Med surg   Author: Bethena Roys, MD 11/15/2021 7:33 PM  For on call review [email or web address].

## 2021-11-15 NOTE — Progress Notes (Signed)
Patient attempted Chiropodist.  Patient gave a fairly good effort.  Patient was able to achieve almost 500 ml but was only able to do X7.  IS left in patient's bed.

## 2021-11-15 NOTE — Assessment & Plan Note (Signed)
Patient is blind.  -Hold Plavix for now with possible hemothorax, -Resume statin,

## 2021-11-15 NOTE — Assessment & Plan Note (Signed)
History of temporal arteritis and PMR.  She is on Actemra injections once a week. -Resume steroids

## 2021-11-15 NOTE — Assessment & Plan Note (Signed)
Stable and compensated.  Not on diuretics.  Last echo 11/2020, EF of 65 to 70% with grade 1 DD.

## 2021-11-15 NOTE — ED Triage Notes (Signed)
Pt fell forward into the bathtub while pulling pants off to use the toilet, reported that pt lost her balance.  Pt is blind. Pt with right ribs are painful.  Pt denies hitting her head.  Pt is currently on Actemra injections

## 2021-11-15 NOTE — Telephone Encounter (Signed)
Patient's daughter called stating that her mother's Actemra had not been available. Seth Bake resent Rx and patient's daughter was advised that the Actemra should ready in 24 hours.

## 2021-11-16 DIAGNOSIS — S2241XA Multiple fractures of ribs, right side, initial encounter for closed fracture: Secondary | ICD-10-CM | POA: Diagnosis not present

## 2021-11-16 LAB — CBC
HCT: 37.6 % (ref 36.0–46.0)
Hemoglobin: 12.9 g/dL (ref 12.0–15.0)
MCH: 34.1 pg — ABNORMAL HIGH (ref 26.0–34.0)
MCHC: 34.3 g/dL (ref 30.0–36.0)
MCV: 99.5 fL (ref 80.0–100.0)
Platelets: 163 10*3/uL (ref 150–400)
RBC: 3.78 MIL/uL — ABNORMAL LOW (ref 3.87–5.11)
RDW: 13.5 % (ref 11.5–15.5)
WBC: 3.9 10*3/uL — ABNORMAL LOW (ref 4.0–10.5)
nRBC: 0 % (ref 0.0–0.2)

## 2021-11-16 LAB — GLUCOSE, CAPILLARY
Glucose-Capillary: 152 mg/dL — ABNORMAL HIGH (ref 70–99)
Glucose-Capillary: 230 mg/dL — ABNORMAL HIGH (ref 70–99)
Glucose-Capillary: 322 mg/dL — ABNORMAL HIGH (ref 70–99)
Glucose-Capillary: 119 mg/dL — ABNORMAL HIGH (ref 70–99)
Glucose-Capillary: 111 mg/dL — ABNORMAL HIGH (ref 70–99)

## 2021-11-16 MED ORDER — LABETALOL HCL 5 MG/ML IV SOLN
10.0000 mg | INTRAVENOUS | Status: DC | PRN
  Administered 2021-11-16: 10 mg via INTRAVENOUS
  Filled 2021-11-16: qty 4

## 2021-11-16 MED ORDER — LOSARTAN POTASSIUM 50 MG PO TABS
25.0000 mg | ORAL_TABLET | Freq: Every day | ORAL | Status: DC
  Administered 2021-11-17: 25 mg via ORAL
  Filled 2021-11-16: qty 1

## 2021-11-16 NOTE — Plan of Care (Signed)
  Problem: Acute Rehab OT Goals (only OT should resolve) Goal: Pt. Will Perform Grooming Flowsheets (Taken 11/16/2021 1610) Pt Will Perform Grooming: with set-up Goal: Pt. Will Perform Lower Body Dressing Flowsheets (Taken 11/16/2021 1610) Pt Will Perform Lower Body Dressing:  with min assist  with min guard assist  sitting/lateral leans Goal: Pt. Will Transfer To Toilet Flowsheets (Taken 11/16/2021 1610) Pt Will Transfer to Toilet:  with supervision  stand pivot transfer Goal: Pt/Caregiver Will Perform Home Exercise Program Flowsheets (Taken 11/16/2021 1610) Pt/caregiver will Perform Home Exercise Program:  Increased ROM  Increased strength  Both right and left upper extremity  With Supervision  Avish Torry OT, MOT

## 2021-11-16 NOTE — Progress Notes (Signed)
Patient requires frequent re-positioning of the body in ways that cannot be achieved with an ordinary bed or wedge pillow to eliminate pain and the head of the bed needs to be evaluated more than 30 degrees most of the time 

## 2021-11-16 NOTE — Progress Notes (Signed)
PROGRESS NOTE  Jenna Long  SEG:[id number] DOB: 03/27/1934 DOA: 11/15/2021 PCP: Sharilyn Sites, MD   Brief Narrative: Patient is a 87 year old female with history of diabetes type 2, stroke, hypertension, diastolic CHF, blindness, temporal arteritis who was brought to the emergency department after a fall at home. .  Patient is blind.  Also report that she is a sleep walker at baseline.  She fell while she was going to the bathroom.  On presentation,she was hypertensive, saturating fine on room air.  Imagings showed nondisplaced fracture of the right seventh through ninth ribs, mildy displaced fracture of the 10-11th rib, trace right pleural effusion suspicious for hemothorax.  Patient admitted for further management.  PT/OT consulted.  Assessment & Plan:  Principal Problem:   Multiple rib fractures Active Problems:   Chronic diastolic CHF (congestive heart failure) (HCC)   Essential hypertension   Fall   Temporal arteritis (HCC)   Type 2 diabetes mellitus with hyperglycemia, with long-term current use of insulin (HCC)   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 ([place])   Fall/rib fractures: Patient is blind.  [place] while going to the bathroom.  Lives with daughter.  Head, cervical CT without any acute findings.magings showed nondisplaced fracture of the right seventh through ninth ribs, mildy displaced fracture of the 10-11th rib, trace right pleural effusion suspicious for hemothorax. Continue incentive spirometer.  Currently on room air.  Continue pain medications.  PT/OT consulted.  Daughter requesting for hospital bed. Denies any pain, cough during my evaluation today.  Chronic diastolic CHF: Currently following.  Not on diuretics.  Last echo on 10/22 showed EF of 65 to 73%, grade 1 diastolic dysfunction  History of stroke/occlusion of left anterior cerebral artery: Patient is blind.  On Plavix, statin at home.  Currently Plavix on hold due to finding of  hemothorax  Right thyroid nodule: Right thyroid nodule measuring 2.0 cm. Nonemergent thyroid US can be done as an outpatient  History of temporal arteritis/polymyalgia rheumatica: On Actemra injections once a week.  On home steroids which are continued  Hypertension: Remains hypertensive.  Likely secondary to pain.  Continue Norvasc, losartan, metoprolol.      DVT prophylaxis:SCDs Start: 11/15/21 1932     Code Status: Full Code  Family Communication: Discussed with daughter on phone on 9/28  Patient status:Inpatient  Patient is from :Home  Anticipated discharge XT:GGYI vs SnF,pending PT evaluation  Estimated DC date:1-2 days   Consultants: None  Procedures:None  Antimicrobials:  Anti-infectives (From admission, onward)    Start     Dose/Rate Route Frequency Ordered Stop   11/15/21 2015  cefTRIAXone (ROCEPHIN) 1 g in sodium chloride 0.9 % 100 mL IVPB  Status:  Discontinued        1 g 200 mL/hr over 30 Minutes Intravenous Every 24 hours 11/15/21 1918 11/15/21 1922       Subjective: Patient seen and examined the bedside today.  Hemodynamically stable.  Very comfortable, sitting on the chair.  On room air.  Denies any worsening shortness of breath or cough.  Using incentive spirometer  Objective: Vitals:   11/15/21 1856 11/15/21 2136 11/16/21 0522 11/16/21 0528  BP: (!) 138/112 (!) 176/66 (!) 223/76 (!) 168/72  Pulse: (!) 53 (!) 56 (!) 57   Resp: '18 20 17   '$ Temp: 97.9 F (36.6 C) (!) 97.4 F (36.3 C) 98 F (36.7 C)   TempSrc:  Oral    SpO2: 98% 98% 97%   Weight: 60.2 kg  Height: '5\' 7"'$  (1.702 m)       Intake/Output Summary (Last 24 hours) at 11/16/2021 1101 Last data filed at 11/16/2021 0900 Gross per 24 hour  Intake 480 ml  Output 1350 ml  Net -870 ml   Filed Weights   11/15/21 1107 11/15/21 1856  Weight: 60.8 kg 60.2 kg    Examination:  General exam: Overall comfortable, not in distress, pleasant elderly female HEENT: Blind Respiratory system:   no wheezes or crackles , diminished sounds on the right base Cardiovascular system: S1 & S2 heard, RRR.  Gastrointestinal system: Abdomen is nondistended, soft and nontender. Central nervous system: Alert and oriented Extremities: No edema, no clubbing ,no cyanosis Skin: Bruise on the right lower chest, scattered bruises around the body   Data Reviewed: I have personally reviewed following labs and imaging studies  CBC: Recent Labs  Lab 11/15/21 1335 11/16/21 0454  WBC 5.4 3.9*  NEUTROABS 4.7  --   HGB 13.1 12.9  HCT 37.3 37.6  MCV 98.4 99.5  PLT 155 814   Basic Metabolic Panel: Recent Labs  Lab 11/15/21 1150  NA 139  K 4.7  CL 104  CO2 24  GLUCOSE 223*  BUN 19  CREATININE 0.85  CALCIUM 9.3     No results found for this or any previous visit (from the past 240 hour(s)).   Radiology Studies: CT Chest W Contrast  Result Date: 11/15/2021 CLINICAL DATA:  Chest trauma EXAM: CT CHEST WITH CONTRAST TECHNIQUE: Multidetector CT imaging of the chest was performed during intravenous contrast administration. RADIATION DOSE REDUCTION: This exam was performed according to the departmental dose-optimization program which includes automated exposure control, adjustment of the mA and/or kV according to patient size and/or use of iterative reconstruction technique. CONTRAST:  17m OMNIPAQUE IOHEXOL 300 MG/ML  SOLN COMPARISON:  Same day rib radiograph FINDINGS: Cardiovascular: Normal heart size. No pericardial effusion. Aortic valve calcifications. Normal caliber thoracic aorta with moderate atherosclerotic disease. Suspicious filling defects of the central pulmonary arteries. Moderate three-vessel coronary artery calcifications. Mediastinum/Nodes: Esophagus is unremarkable. Exophytic nodule of the right lobe of the thyroid measuring 2.0 cm. No pathologically enlarged lymph nodes seen in the chest. Lungs/Pleura: Central airways are patent. No consolidation or pneumothorax. Trace right pleural  effusion. Upper Abdomen: No acute abnormality. Musculoskeletal: Mildly displaced fractures of the posterior right 10th and 11th ribs and nondisplaced fractures of the right lateral 7th-9th ribs. IMPRESSION: 1. No evidence of pneumothorax. 2. Trace right pleural effusion, suspicious for hemothorax given associated rib fractures. 3. Mildly displaced fractures of the posterior right 10th and 11th ribs and nondisplaced fractures of the right lateral 7th-9th ribs. 4. Right thyroid nodule measuring 2.0 cm. Recommend nonemergent thyroid [place] clinically warranted given patient age. Reference: J Am Coll Radiol. 2015 Feb;12(2): 143-50 5. Aortic valve calcifications, which can be seen in the setting of aortic stenosis or sclerosis. Correlate with echocardiography. 6. Moderate three-vessel coronary artery calcifications. 7.  Aortic Atherosclerosis (ICD10-I70.0). Electronically Signed   By: LYetta GlassmanM.D.   On: 11/15/2021 14:56   DG Ribs Unilateral W/Chest Right  Result Date: 11/15/2021 CLINICAL DATA:  Fall.  Right rib pain EXAM: RIGHT RIBS AND CHEST - 3+ VIEW COMPARISON:  Chest 03/24/2021 FINDINGS: Mild cardiac enlargement. Vascularity normal. Atherosclerotic calcification aortic arch Lungs clear without infiltrate or effusion.  No pneumothorax Nondisplaced fractures right seventh, eighth, ninth ribs IMPRESSION: Nondisplaced fractures right seventh, eighth, ninth ribs No acute cardiopulmonary abnormality. Electronically Signed   By: CFranchot GalloM.D.  On: 11/15/2021 12:59   CT Head Wo Contrast  Result Date: 11/15/2021 CLINICAL DATA:  Fall.  Head trauma. EXAM: CT HEAD WITHOUT CONTRAST CT CERVICAL SPINE WITHOUT CONTRAST TECHNIQUE: Multidetector CT imaging of the head and cervical spine was performed following the standard protocol without intravenous contrast. Multiplanar CT image reconstructions of the cervical spine were also generated. RADIATION DOSE REDUCTION: This exam was performed according to the  departmental dose-optimization program which includes automated exposure control, adjustment of the mA and/or kV according to patient size and/or use of iterative reconstruction technique. COMPARISON:  None Available. FINDINGS: CT HEAD FINDINGS Brain: No evidence of acute infarction, hemorrhage, hydrocephalus, extra-axial collection or mass lesion/mass effect. There is mild diffuse low-attenuation within the subcortical and periventricular white matter compatible with chronic microvascular disease. Prominence of the sulci and ventricles compatible with atrophy. Vascular: No hyperdense vessel or unexpected calcification. Skull: Normal. Negative for fracture or focal lesion. Sinuses/Orbits: Paranasal sinuses and mastoid air cells are clear. Other: None. CT CERVICAL SPINE FINDINGS Alignment: Normal alignment. Skull base and vertebrae: No acute fracture. No primary bone lesion or focal pathologic process. Soft tissues and spinal canal: No prevertebral fluid or swelling. No visible canal hematoma. Disc levels:  Multilevel degenerative disc disease. Upper chest: Negative. Other: None IMPRESSION: 1. No acute intracranial abnormality. 2. Chronic microvascular disease and atrophy. 3. No evidence for cervical spine fracture or subluxation. 4. Multilevel degenerative disc disease. Electronically Signed   By: Kerby Moors M.D.   On: 11/15/2021 12:57   CT Cervical Spine Wo Contrast  Result Date: 11/15/2021 CLINICAL DATA:  Fall.  Head trauma. EXAM: CT HEAD WITHOUT CONTRAST CT CERVICAL SPINE WITHOUT CONTRAST TECHNIQUE: Multidetector CT imaging of the head and cervical spine was performed following the standard protocol without intravenous contrast. Multiplanar CT image reconstructions of the cervical spine were also generated. RADIATION DOSE REDUCTION: This exam was performed according to the departmental dose-optimization program which includes automated exposure control, adjustment of the mA and/or kV according to patient  size and/or use of iterative reconstruction technique. COMPARISON:  None Available. FINDINGS: CT HEAD FINDINGS Brain: No evidence of acute infarction, hemorrhage, hydrocephalus, extra-axial collection or mass lesion/mass effect. There is mild diffuse low-attenuation within the subcortical and periventricular white matter compatible with chronic microvascular disease. Prominence of the sulci and ventricles compatible with atrophy. Vascular: No hyperdense vessel or unexpected calcification. Skull: Normal. Negative for fracture or focal lesion. Sinuses/Orbits: Paranasal sinuses and mastoid air cells are clear. Other: None. CT CERVICAL SPINE FINDINGS Alignment: Normal alignment. Skull base and vertebrae: No acute fracture. No primary bone lesion or focal pathologic process. Soft tissues and spinal canal: No prevertebral fluid or swelling. No visible canal hematoma. Disc levels:  Multilevel degenerative disc disease. Upper chest: Negative. Other: None IMPRESSION: 1. No acute intracranial abnormality. 2. Chronic microvascular disease and atrophy. 3. No evidence for cervical spine fracture or subluxation. 4. Multilevel degenerative disc disease. Electronically Signed   By: Kerby Moors M.D.   On: 11/15/2021 12:57    Scheduled Meds:  amLODipine  5 mg Oral Daily   atorvastatin  40 mg Oral Daily   donepezil  5 mg Oral QHS   dorzolamide  1 drop Both Eyes BID   escitalopram  10 mg Oral Daily   insulin aspart  0-5 Units Subcutaneous QHS   insulin aspart  0-9 Units Subcutaneous TID WC   losartan  25 mg Oral Daily   melatonin  4.5 mg Oral QHS   metoprolol succinate  25 mg Oral  Daily   predniSONE  5 mg Oral Q breakfast   Continuous Infusions:   LOS: 0 days   Shelly Coss, MD [facility] P9/28/2023, 11:01 AM

## 2021-11-16 NOTE — Care Management Obs Status (Signed)
[place] NOTIFICATION   Patient Details  Name: Jenna Long MRN: [id number] Date of Birth: Dec 04, 1934   Medicare Observation Status Notification Given:  Yes (spoke with daughter, expressed understanding, asked for copy to be left in room)    Tommy Medal 11/16/2021, 2:36 PM

## 2021-11-16 NOTE — Evaluation (Signed)
Physical Therapy Evaluation Patient Details Name: Jenna Long MRN: [id number] DOB: Jul 18, 1934 Today's Date: 11/16/2021  History of Present Illness  Jenna Long is a 87 y.o. female with medical history significant for diabetes mellitus, stroke, hypertension, diastolic CHF, blindness, temporal arteritis and PMR.  Patient was brought to the ED with reports of a fall.  At the time of my evaluation, family is not at bedside, patient is awake alert able to answer most questions.  Patient is blind.  She does not know exactly why she fell, but she feels she lost her balance.  She reports she is asleep walker at baseline.  At the time she felt she was not using her walker, she was trying to get her clothes on.  Daughter was present when patient fell.   Patient's daughter reported to ED provider that she helped the patient to go to the bathroom, she turned away to grab her clothes and she had patient fall.  She reports patient has ongoing gait abnormality, and leans heavily forward when walking and feels patient lost her balance.  Patient denies chest pain, no difficulty breathing.   Clinical Impression  Patient demonstrates slow labored movement for sitting up at bedside with c/o of mild right side rib cage pain, unsteady on feet, required frequent verbal/tactile cueing, Min/mod assist to walk in room mostly due to blindness and generalized weakness.  Patient able to transfer to commode in bathroom using side rail and Min/mod assist and unable to walk outside of room due to c/o fatigue and right sided rib cage pain.  Patient tolerated sitting up in chair after therapy.  Patient will benefit from continued skilled physical therapy in hospital and recommended venue below to increase strength, balance, endurance for safe ADLs and gait.          Recommendations for follow up therapy are one component of a multi-disciplinary discharge planning process, led by the attending physician.  Recommendations may be  updated based on patient status, additional functional criteria and insurance authorization.  Follow Up Recommendations Skilled nursing-short term rehab (<3 hours/day) Can patient physically be transported by private vehicle: Yes    Assistance Recommended at Discharge Set up Supervision/Assistance  Patient can return home with the following  A lot of help with bathing/dressing/bathroom;A lot of help with walking and/or transfers;Help with stairs or ramp for entrance;Assistance with cooking/housework    Equipment Recommendations None recommended by PT  Recommendations for Other Services       Functional Status Assessment Patient has had a recent decline in their functional status and demonstrates the ability to make significant improvements in function in a reasonable and predictable amount of time.     Precautions / Restrictions Precautions Precautions: Fall Restrictions Weight Bearing Restrictions: No      Mobility  Bed Mobility Overal bed mobility: Needs Assistance Bed Mobility: Supine to Sit     Supine to sit: Min assist, Mod assist     General bed mobility comments: increased time, labored movement with c/o pain right side of rib cage    Transfers Overall transfer level: Needs assistance Equipment used: Rolling walker (2 wheels) Transfers: Sit to/from Stand, Bed to chair/wheelchair/BSC Sit to Stand: Min assist   Step pivot transfers: Min assist, Mod assist       General transfer comment: labored movement, frequent verbal/tacitle cueing mostly due to blindness    Ambulation/Gait Ambulation/Gait assistance: Min assist, Mod assist Gait Distance (Feet): 18 Feet Assistive device: Rolling walker (2 wheels) Gait Pattern/deviations: Decreased  step length - right, Decreased step length - left, Decreased stride length Gait velocity: decreased     General Gait Details: slow slightly labored cadence without loss of balance, but requires frequent verbal/tactile cueing  mostly due to blindness, limited due to c/o fatigue and right rib cage pain  Stairs            Wheelchair Mobility    Modified Rankin (Stroke Patients Only)       Balance Overall balance assessment: Needs assistance Sitting-balance support: Feet supported, No upper extremity supported Sitting balance-Leahy Scale: Good Sitting balance - Comments: seated at EOB   Standing balance support: During functional activity, Bilateral upper extremity supported Standing balance-Leahy Scale: Fair Standing balance comment: using RW                             Pertinent Vitals/Pain Pain Assessment Pain Assessment: Faces Faces Pain Scale: Hurts little more Pain Location: right side of rib cage Pain Descriptors / Indicators: Sore, Grimacing, Guarding Pain Intervention(s): Limited activity within patient's tolerance, Monitored during session, Repositioned    Home Living Family/patient expects to be discharged to:: Private residence Living Arrangements: Children Available Help at Discharge: Family;Available 24 hours/day Type of Home: House Home Access: Stairs to enter Entrance Stairs-Rails: Right;Left;Can reach both Entrance Stairs-Number of Steps: 3   Home Layout: One level Home Equipment: Conservation officer, nature (2 wheels);Cane - single point;BSC/3in1;Shower seat;Wheelchair - manual;Grab bars - tub/shower;Toilet riser Additional Comments: information per chart, patient is poor historian    Prior Function Prior Level of Function : Needs assist       Physical Assist : Mobility (physical);ADLs (physical) Mobility (physical): Bed mobility;Transfers;Gait;Stairs   Mobility Comments: household ambulator using RW with assist due to blindness ADLs Comments: assisted by family     Hand Dominance   Dominant Hand: Right    Extremity/Trunk Assessment   Upper Extremity Assessment Upper Extremity Assessment: Generalized weakness    Lower Extremity Assessment Lower Extremity  Assessment: Generalized weakness    Cervical / Trunk Assessment Cervical / Trunk Assessment: Normal  Communication   Communication: HOH  Cognition Arousal/Alertness: Awake/alert Behavior During Therapy: WFL for tasks assessed/performed Overall Cognitive Status: Within Functional Limits for tasks assessed                                          General Comments      Exercises     Assessment/Plan    PT Assessment Patient needs continued PT services  PT Problem List Decreased strength;Decreased activity tolerance;Decreased balance;Decreased mobility       PT Treatment Interventions DME instruction;Gait training;Stair training;Functional mobility training;Therapeutic activities;Therapeutic exercise;Patient/family education;Balance training    PT Goals (Current goals can be found in the Care Plan section)  Acute Rehab PT Goals Patient Stated Goal: return home with family to assist PT Goal Formulation: With patient Time For Goal Achievement: 11/30/21 Potential to Achieve Goals: Good    Frequency Min 3X/week     Co-evaluation               AM-PAC PT "6 Clicks" Mobility  Outcome Measure Help needed turning from your back to your side while in a flat bed without using bedrails?: A Little Help needed moving from lying on your back to sitting on the side of a flat bed without using bedrails?: A Little Help needed moving  to and from a bed to a chair (including a wheelchair)?: A Little Help needed standing up from a chair using your arms (e.g., wheelchair or bedside chair)?: A Little Help needed to walk in hospital room?: A Lot Help needed climbing 3-5 steps with a railing? : A Lot 6 Click Score: 16    End of Session   Activity Tolerance: Patient tolerated treatment well;Patient limited by fatigue Patient left: in chair;with call bell/phone within reach;with chair alarm set Nurse Communication: Mobility status PT Visit Diagnosis: Unsteadiness on feet  (R26.81);Other abnormalities of gait and mobility (R26.89);Muscle weakness (generalized) (M62.81)    Time: 7517-0017 PT Time Calculation (min) (ACUTE ONLY): 20 min   Charges:   PT Evaluation $PT Eval Moderate Complexity: 1 Mod PT Treatments $Therapeutic Activity: 8-22 mins        11:54 AM, 11/16/21 Lonell Grandchild, MPT Physical Therapist with [facility] [phone] office [phone] mobile phone

## 2021-11-16 NOTE — TOC Initial Note (Signed)
Transition of Care [facility]) - Initial/Assessment Note    Patient Details  Name: Jenna Long MRN: [id number] Date of Birth: Jun 27, 1934  Transition of Care [facility]) CM/SW Contact:    Ihor Gully, LCSW Phone Number: 11/16/2021, 11:58 AM  Clinical Narrative:                 Patient from home with daughter, Butch Penny. Admitted after fall with multiple rib fractures. Has walker, wheelchair, bsc. Will start using wc instead of walker. PT recommends SNF. Butch Penny declines SNF and HH. Indicates that patient has had HH three times and she is at her baseline. Requests hospital bed. Attending notified of hospital bed request.   Expected Discharge Plan: Home/Self Care Barriers to Discharge: No Barriers Identified   Patient Goals and CMS Choice Patient states their goals for this hospitalization and ongoing recovery are:: home   Choice offered to / list presented to : Adult Children  Expected Discharge Plan and Services Expected Discharge Plan: Home/Self Care     Post Acute Care Choice: Durable Medical Equipment Living arrangements for the past 2 months: Single Family Home                 DME Arranged: Hospital bed   Date DME Agency Contacted: 11/16/21 Time DME Agency Contacted: 25 Representative spoke with at [place]: Thedore Mins            Prior Living Arrangements/Services Living arrangements for the past 2 months: [place] with:: Adult Children Patient language and need for interpreter reviewed:: Yes        Need for Family Participation in Patient Care: Yes (Comment) Care giver support system in place?: Yes (comment) Current home services: DME Criminal Activity/Legal Involvement Pertinent to Current Situation/Hospitalization: No - Comment as needed  Activities of Daily Living Home Assistive Devices/Equipment: Walker (specify type), Bedside commode/3-in-1 ADL Screening (condition at time of admission) Patient's cognitive ability adequate to safely complete daily  activities?: No Is the patient deaf or have difficulty hearing?: Yes Does the patient have difficulty seeing, even when wearing glasses/contacts?: Yes (legally blind) Does the patient have difficulty concentrating, remembering, or making decisions?: No Patient able to express need for assistance with ADLs?: Yes Does the patient have difficulty dressing or bathing?: Yes (states daughter helps her with shower) Independently performs ADLs?: Yes (appropriate for developmental age) Does the patient have difficulty walking or climbing stairs?: Yes Weakness of Legs: Both Weakness of Arms/Hands: None  Permission Sought/Granted Permission sought to share information with : Family Supports    Share Information with NAME: Butch Penny, daughter           Emotional Assessment              Admission diagnosis:  Multiple rib fractures [S22.49XA] Fall, initial encounter [W19.XXXA] Closed fracture of multiple ribs of right side, initial encounter [S22.41XA] Patient Active Problem List   Diagnosis Date Noted   Multiple rib fractures 11/15/2021   High risk medication use 07/26/2021   AMS (altered mental status) 03/24/2021   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 ([place]) 12/13/2020   Mixed diabetic hyperlipidemia associated with type 2 diabetes mellitus ([place]) 12/13/2020   Acute cystitis without hematuria 12/13/2020   Chronic diastolic CHF (congestive heart failure) ([place]) 12/13/2020   Vision loss of right eye    Dyslipidemia    Type 2 diabetes mellitus with hyperglycemia, with long-term current use of insulin (HCC)    Cerebral thrombosis with cerebral infarction 11/09/2020  Temporal arteritis ([place]) 11/08/2020   Diabetes mellitus type 2 in nonobese [facility]) 11/08/2020   DNR (do not resuscitate) 11/08/2020   Left displaced femoral neck fracture ([place]) 02/18/2020   Essential hypertension 02/18/2020   Mixed hyperlipidemia 02/18/2020   Fall    PCP:  Sharilyn Sites, MD Pharmacy:    [facility] [place] [address] [place] [address] Phone: [phone] Fax: [facility], [place] [address] [place] [address] Phone: [phone] Fax: [phone]     Social Determinants of Health (SDOH) Interventions    Readmission Risk Interventions     No data to display

## 2021-11-16 NOTE — Plan of Care (Signed)
  Problem: Acute Rehab PT Goals(only PT should resolve) Goal: Pt Will Go Supine/Side To Sit Outcome: Progressing Flowsheets (Taken 11/16/2021 1157) Pt will go Supine/Side to Sit:  with min guard assist  with minimal assist Goal: Patient Will Transfer Sit To/From Stand Outcome: Progressing Flowsheets (Taken 11/16/2021 1157) Patient will transfer sit to/from stand:  with min guard assist  with minimal assist Goal: Pt Will Transfer Bed To Chair/Chair To Bed Outcome: Progressing Flowsheets (Taken 11/16/2021 1157) Pt will Transfer Bed to Chair/Chair to Bed:  min guard assist  with min assist Goal: Pt Will Ambulate Outcome: Progressing Flowsheets (Taken 11/16/2021 1157) Pt will Ambulate:  25 feet  with minimal assist  with rolling walker  with moderate assist   12:00 PM, 11/16/21 Lonell Grandchild, MPT Physical Therapist with [facility] [phone] office [phone] mobile phone

## 2021-11-16 NOTE — Evaluation (Addendum)
Occupational Therapy Evaluation Patient Details Name: Jenna Long MRN: [id number] DOB: [birth year] Today's Date: 11/16/2021   History of Present Illness Jenna Long is a 87 y.o. female with medical history significant for diabetes mellitus, stroke, hypertension, diastolic CHF, blindness, temporal arteritis and PMR.  Patient was brought to the ED with reports of a fall.  At the time of my evaluation, family is not at bedside, patient is awake alert able to answer most questions.  Patient is blind.  She does not know exactly why she fell, but she feels she lost her balance.  She reports she is asleep walker at baseline.  At the time she felt she was not using her walker, she was trying to get her clothes on.  Daughter was present when patient fell.   Patient's daughter reported to ED provider that she helped the patient to go to the bathroom, she turned away to grab her clothes and she had patient fall.  She reports patient has ongoing gait abnormality, and leans heavily forward when walking and feels patient lost her balance.  Patient denies chest pain, no difficulty breathing.   Clinical Impression   Pt agreeable to OT evaluation. Pt hard of hearing and seeming confused at times by stating she lived with her mother. Pt shows increased pain during movement but able to tolerate mobility with min to mod A overall. Pt stood ~3 times today mostly from chair needing min A to boost to stand. Pt will benefit from continued OT in the hospital and recommended venue below to increase strength, balance, and endurance for safe ADL's.         Recommendations for follow up therapy are one component of a multi-disciplinary discharge planning process, led by the attending physician.  Recommendations may be updated based on patient status, additional functional criteria and insurance authorization.   Follow Up Recommendations  Skilled nursing-short term rehab (<3 hours/day)    Assistance Recommended at  Discharge Intermittent Supervision/Assistance  Patient can return home with the following A little help with walking and/or transfers;A little help with bathing/dressing/bathroom;Assistance with cooking/housework;Assist for transportation;Help with stairs or ramp for entrance;Direct supervision/assist for medications management    Functional Status Assessment  Patient has had a recent decline in their functional status and demonstrates the ability to make significant improvements in function in a reasonable and predictable amount of time.  Equipment Recommendations  None recommended by OT    Recommendations for Other Services       Precautions / Restrictions Precautions Precautions: Fall Restrictions Weight Bearing Restrictions: No      Mobility Bed Mobility Overal bed mobility: Needs Assistance Bed Mobility: Supine to Sit     Supine to sit: Min assist, Mod assist     General bed mobility comments: increased time, labored movement with c/o pain right side of rib cage    Transfers Overall transfer level: Needs assistance Equipment used: Rolling walker (2 wheels) Transfers: Sit to/from Stand, Bed to chair/wheelchair/BSC Sit to Stand: Min assist     Step pivot transfers: Min assist, Mod assist     General transfer comment: Needs verbal and tactile curing. Unsteady on feet.      Balance Overall balance assessment: Needs assistance Sitting-balance support: Feet supported, No upper extremity supported Sitting balance-Leahy Scale: Good Sitting balance - Comments: seated at EOB   Standing balance support: During functional activity, Bilateral upper extremity supported Standing balance-Leahy Scale: Fair Standing balance comment: using RW  ADL either performed or assessed with clinical judgement   ADL Overall ADL's : Needs assistance/impaired Eating/Feeding: Set up;Sitting   Grooming: Set up;Min guard;Sitting   Upper Body  Bathing: Minimal assistance;Sitting   Lower Body Bathing: Minimal assistance;Moderate assistance;Sitting/lateral leans   Upper Body Dressing : Minimal assistance;Sitting   Lower Body Dressing: Sitting/lateral leans;Moderate assistance Lower Body Dressing Details (indicate cue type and reason): Pt able to doff socks seated at EOB with extended time. Assited to don socks. Toilet Transfer: Minimal assistance;Moderate assistance;Stand-pivot;Rolling walker (2 wheels) Toilet Transfer Details (indicate cue type and reason): Simulated via EOB to chair transfer with RW.         Functional mobility during ADLs: Minimal assistance;Moderate assistance;Rolling walker (2 wheels) General ADL Comments: Labored movement with min to mod A with RW. Takeing one or two steps forward and backward today.     Vision Baseline Vision/History: 2 Legally blind Ability to See in Adequate Light: 4 Severely impaired                  Pertinent Vitals/Pain Pain Assessment Pain Assessment: Faces Faces Pain Scale: Hurts little more Pain Location: right side of rib cage Pain Descriptors / Indicators: Sore, Grimacing, Guarding Pain Intervention(s): Limited activity within patient's tolerance, Monitored during session, Repositioned     Hand Dominance Right   Extremity/Trunk Assessment Upper Extremity Assessment Upper Extremity Assessment: Generalized weakness (~75% of available shoulder flexion A/ROM bilaterally. Possibly baseline or limitation due to pain in R ribs area.)   Lower Extremity Assessment Lower Extremity Assessment: Defer to PT evaluation   Cervical / Trunk Assessment Cervical / Trunk Assessment: Normal   Communication Communication Communication: HOH   Cognition Arousal/Alertness: Awake/alert Behavior During Therapy: WFL for tasks assessed/performed Overall Cognitive Status: Within Functional Limits for tasks assessed                                                         Home Living Family/patient expects to be discharged to:: Private residence Living Arrangements: Children Available Help at Discharge: Family;Available 24 hours/day Type of Home: House Home Access: Stairs to enter [employer] of Steps: 3 Entrance Stairs-Rails: Right;Left;Can reach both Home Layout: One level     Bathroom Shower/Tub: Tub/shower unit;Door   [employer] Toilet: Handicapped height Bathroom Accessibility: Yes   Home Equipment: Conservation officer, nature (2 wheels);Cane - single point;BSC/3in1;Shower seat;Wheelchair - manual;Grab bars - tub/shower;Toilet riser   Additional Comments: information per chart, patient is poor historian      Prior Functioning/Environment Prior Level of Function : Needs assist       Physical Assist : Mobility (physical);ADLs (physical) Mobility (physical): Bed mobility;Transfers;Gait;Stairs ADLs (physical): Dressing;Bathing;Toileting;IADLs Mobility Comments: household ambulator using RW with assist due to blindness ADLs Comments: assisted by family for bathing, dressing, and toileting. Reports indepndent grooming and feeding.        OT Problem List: Decreased strength;Decreased range of motion;Decreased activity tolerance;Impaired balance (sitting and/or standing);Pain      OT Treatment/Interventions: Self-care/ADL training;Therapeutic exercise;Therapeutic activities;Patient/family education;Balance training    OT Goals(Current goals can be found in the care plan section) Acute Rehab OT Goals Patient Stated Goal: Get back to bed. Pt likely wishing to return home as well. OT Goal Formulation: With patient Time For Goal Achievement: 11/30/21 Potential to Achieve Goals: Good  OT Frequency: Min 2X/week  11/16/21 2620  AM-PAC OT "6 Clicks" Daily Activity Outcome Measure (Version 2)  Help from another person eating meals? 3  Help from another person taking care of personal grooming? 3  Help from another person toileting,  which includes using toliet, bedpan, or urinal? 3  Help from another person bathing (including washing, rinsing, drying)? 2  Help from another person to put on and taking off regular upper body clothing? 3  Help from another person to put on and taking off regular lower body clothing? 2  6 Click Score 16                                 End of Session Equipment Utilized During Treatment: Rolling walker (2 wheels) Nurse Communication: Other (comment) (notified pt in chair)  Activity Tolerance: Patient tolerated treatment well Patient left: in chair;with call bell/phone within reach;with chair alarm set  OT Visit Diagnosis: Unsteadiness on feet (R26.81);Other abnormalities of gait and mobility (R26.89);Muscle weakness (generalized) (M62.81);Pain;History of falling (Z91.81) Pain - Right/Left: Right Pain - part of body:  (ribs)                Time: 3559-7416 OT Time Calculation (min): 15 min Charges:  OT General Charges $OT Visit: 1 Visit OT Evaluation $OT Eval Low Complexity: 1 Low  Kemp Gomes OT, MOT  Larey Seat 11/16/2021, 4:08 PM

## 2021-11-17 DIAGNOSIS — S2241XA Multiple fractures of ribs, right side, initial encounter for closed fracture: Secondary | ICD-10-CM | POA: Diagnosis not present

## 2021-11-17 DIAGNOSIS — Z79899 Other long term (current) drug therapy: Secondary | ICD-10-CM | POA: Diagnosis not present

## 2021-11-17 DIAGNOSIS — R4182 Altered mental status, unspecified: Secondary | ICD-10-CM | POA: Diagnosis not present

## 2021-11-17 DIAGNOSIS — S2249XA Multiple fractures of ribs, unspecified side, initial encounter for closed fracture: Secondary | ICD-10-CM | POA: Diagnosis not present

## 2021-11-17 DIAGNOSIS — I63522 Cerebral infarction due to unspecified occlusion or stenosis of left anterior cerebral artery: Secondary | ICD-10-CM | POA: Diagnosis not present

## 2021-11-17 LAB — CBC
HCT: 36.1 % (ref 36.0–46.0)
Hemoglobin: 12.5 g/dL (ref 12.0–15.0)
MCH: 34.3 pg — ABNORMAL HIGH (ref 26.0–34.0)
MCHC: 34.6 g/dL (ref 30.0–36.0)
MCV: 99.2 fL (ref 80.0–100.0)
Platelets: 162 10*3/uL (ref 150–400)
RBC: 3.64 MIL/uL — ABNORMAL LOW (ref 3.87–5.11)
RDW: 13.8 % (ref 11.5–15.5)
WBC: 4.3 10*3/uL (ref 4.0–10.5)
nRBC: 0 % (ref 0.0–0.2)

## 2021-11-17 LAB — BASIC METABOLIC PANEL
Anion gap: 7 (ref 5–15)
BUN: 28 mg/dL — ABNORMAL HIGH (ref 8–23)
CO2: 28 mmol/L (ref 22–32)
Calcium: 9.2 mg/dL (ref 8.9–10.3)
Chloride: 107 mmol/L (ref 98–111)
Creatinine, Ser: 1.01 mg/dL — ABNORMAL HIGH (ref 0.44–1.00)
GFR, Estimated: 55 mL/min — ABNORMAL LOW (ref >60)
Glucose, Bld: 164 mg/dL — ABNORMAL HIGH (ref 70–99)
Potassium: 3.6 mmol/L (ref 3.5–5.1)
Sodium: 142 mmol/L (ref 135–145)

## 2021-11-17 LAB — GLUCOSE, CAPILLARY: Glucose-Capillary: 164 mg/dL — ABNORMAL HIGH (ref 70–99)

## 2021-11-17 MED ORDER — AMLODIPINE BESYLATE 10 MG PO TABS: 10.0000 mg | ORAL_TABLET | Freq: Every day | ORAL | 0 refills | Status: AC

## 2021-11-17 MED ORDER — HYDROCODONE-ACETAMINOPHEN 5-325 MG PO TABS: 1.0000 | ORAL_TABLET | Freq: Four times a day (QID) | ORAL | 0 refills | Status: AC | PRN

## 2021-11-17 MED ORDER — LIDOCAINE 5 % EX PTCH: 1.0000 | MEDICATED_PATCH | CUTANEOUS | 0 refills | Status: AC

## 2021-11-17 MED ORDER — AMLODIPINE BESYLATE 5 MG PO TABS
10.0000 mg | ORAL_TABLET | Freq: Every day | ORAL | Status: DC
  Administered 2021-11-17: 10 mg via ORAL

## 2021-11-17 NOTE — Inpatient Diabetes Management (Signed)
Inpatient Diabetes Program Recommendations  AACE/ADA: New Consensus Statement on Inpatient Glycemic Control   Target Ranges:  Prepandial:   less than 140 mg/dL      Peak postprandial:   less than 180 mg/dL (1-2 hours)      Critically ill patients:  140 - 180 mg/dL    Latest Reference Range & Units 11/16/21 07:33 11/16/21 11:29 11/16/21 16:31 11/16/21 20:57 11/16/21 21:46 11/17/21 07:47  Glucose-Capillary 70 - 99 mg/dL 152 (H) 230 (H) 322 (H) 119 (H) 111 (H) 164 (H)   Review of Glycemic Control  Diabetes history: DM2 Outpatient Diabetes medications: Lantus 12 units daily, Metformin 500 mg BID, Januvia 100 mg daily Current orders for Inpatient glycemic control: Novolog 0-9 units TID with meals, Novolog 0-5 units QHS; Prednisone 5 mg QAM  Inpatient Diabetes Program Recommendations:    Insulin: If steroids are continued, please consider ordering Novolog 3 units TID with meals for meal coverage if patient eats at least 50% of meals.  Thanks, Barnie Alderman, RN, MSN, [place] [facility] Coordinator Inpatient Diabetes Program [phone] (Team Pager from 8am to [place])

## 2021-11-17 NOTE — Progress Notes (Signed)
Nsg Discharge Note  Admit Date:  11/15/2021 Discharge date: 11/17/2021   MILIKA VENTRESS to be D/C'd Home per MD order.  AVS completed.   Patient/caregiver able to verbalize understanding.  Discharge Medication: Allergies as of 11/17/2021   No Known Allergies      Medication List     STOP taking these medications    insulin glargine 100 UNIT/ML Solostar Pen Commonly known as: LANTUS   Pen Needles 3/16" 31G X 5 MM Misc   sitaGLIPtin 100 MG tablet Commonly known as: JANUVIA       TAKE these medications    acetaminophen 325 MG tablet Commonly known as: TYLENOL Take 325-650 mg by mouth every 6 (six) hours as needed for moderate pain or headache.   Actemra ACTPen 162 MG/0.9ML Soaj Generic drug: Tocilizumab Inject 162 mg into the skin once a week.   amLODipine 10 MG tablet Commonly known as: NORVASC Take 1 tablet (10 mg total) by mouth daily. What changed: how much to take   atorvastatin 40 MG tablet Commonly known as: LIPITOR Take 1 tablet (40 mg total) by mouth daily.   clopidogrel 75 MG tablet Commonly known as: PLAVIX To be restarted in 1 month when course of Aspirin and Brillinta has completed   donepezil 5 MG tablet Commonly known as: ARICEPT Take 5 mg by mouth at bedtime.   dorzolamide 2 % ophthalmic solution Commonly known as: TRUSOPT Place 1 drop into both eyes 2 (two) times daily.   escitalopram 10 MG tablet Commonly known as: LEXAPRO Take 10 mg by mouth daily. What changed: Another medication with the same name was removed. Continue taking this medication, and follow the directions you see here.   HYDROcodone-acetaminophen 5-325 MG tablet Commonly known as: NORCO/VICODIN Take 1 tablet by mouth every 6 (six) hours as needed for moderate pain or severe pain.   lidocaine 5 % Commonly known as: Lidoderm Place 1 patch onto the skin daily. Remove & Discard patch within 12 hours or as directed by MD   losartan 25 MG tablet Commonly known as:  COZAAR Take 25 mg by mouth daily.   melatonin 5 MG Tabs Take 5 mg by mouth at bedtime.   metFORMIN 1000 MG tablet Commonly known as: GLUCOPHAGE Take 500 mg by mouth 2 (two) times daily with a meal.   metoprolol succinate 25 MG 24 hr tablet Commonly known as: TOPROL-XL Take 1 tablet (25 mg total) by mouth daily.   multivitamin tablet Take 1 tablet by mouth daily.   predniSONE 1 MG tablet Commonly known as: DELTASONE Take in combination with 5 mg tablets for total daily dose as directed What changed:  how much to take how to take this when to take this additional instructions Another medication with the same name was removed. Continue taking this medication, and follow the directions you see here.               Durable Medical Equipment  (From admission, onward)           Start     Ordered   11/16/21 1556  For home use only DME Hospital bed  Once       Question Answer Comment  Length of Need Lifetime   Bed type Semi-electric      11/16/21 1555            Discharge Assessment: Vitals:   11/16/21 2205 11/17/21 0512  BP: (!) 165/78 (!) 164/69  Pulse:  60  Resp:  18  Temp:  97.9 F (36.6 C)  SpO2:  96%   Skin clean, dry and intact without evidence of skin break down, no evidence of skin tears noted. IV catheter discontinued intact. Site without signs and symptoms of complications - no redness or edema noted at insertion site, patient denies c/o pain - only slight tenderness at site.  Dressing with slight pressure applied.  D/c Instructions-Education: Discharge instructions given to patient/family with verbalized understanding. D/c education completed with patient/family including follow up instructions, medication list, d/c activities limitations if indicated, with other d/c instructions as indicated by MD - patient able to verbalize understanding, all questions fully answered. Patient instructed to return to ED, call 911, or call MD for any changes in  condition.  Patient escorted via [place], and D/C home via private auto.  Kathie Rhodes, RN 11/17/2021 10:28 AM

## 2021-11-17 NOTE — Discharge Summary (Signed)
Physician Discharge Summary  Jenna Long YTK:[id number] DOB: 08-20-1934 DOA: 11/15/2021  PCP: Sharilyn Sites, MD  Admit date: 11/15/2021 Discharge date: 11/17/2021  Admitted From: Home Disposition:  Home  Discharge Condition:Stable CODE STATUS:FULL Diet recommendation: Heart Healthy  Brief/Interim Summary:  Patient is a 87 year old female with history of diabetes type 2, stroke, hypertension, diastolic CHF, blindness, temporal arteritis who was brought to the emergency department after a fall at home. .  Patient is blind.  Also report that she is a sleep walker at baseline.  She fell while she was going to the bathroom.  On presentation,she was hypertensive, saturating fine on room air.  Imagings showed nondisplaced fracture of the right seventh through ninth ribs, mildy displaced fracture of the 10-11th rib, trace right pleural effusion suspicious for hemothorax.  Patient admitted for further management. Hospital course remained stable.  Her pain is well controlled.  Her respiratory status remained stable, she denies any shortness of breath or cough, seen remains on room air.  PT/OT recommending skilled nursing facility but daughter declines and wants to take her home.  Arranged hospital bed.  Medically stable for discharge today.  Following problems were addressed during her hospitalization:  Fall/rib fractures: Patient is blind.  [place] while going to the bathroom.  Lives with daughter.  Head, cervical CT without any acute findings.magings showed nondisplaced fracture of the right seventh through ninth ribs, mildy displaced fracture of the 10-11th rib, trace right pleural effusion suspicious for hemothorax.  Currently on room air.  Continue pain medications.  PT/OT consulted, recommended SNF but daughter declines.  Daughter requesting for hospital bed. Denies any pain, cough during my evaluation today.   Chronic diastolic CHF: Currently following.  Not on diuretics.  Last echo on 10/22 showed  EF of 65 to 75%, grade 1 diastolic dysfunction   History of stroke/occlusion of left anterior cerebral artery: Patient is blind.  On Plavix, statin at home.     Right thyroid nodule: Right thyroid nodule measuring 2.0 cm. Nonemergent thyroid US can be done as an outpatient   History of temporal arteritis/polymyalgia rheumatica: On Actemra injections once a week.  On prednisone 5 mg   Hypertension: Remains hypertensive.   Continue Norvasc, losartan, metoprolol.  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 [facility])    Discharge Instructions  Discharge Instructions     Diet - low sodium heart healthy   Complete by: As directed    Discharge instructions   Complete by: As directed    1)Please take prescribed medications as instructed 2)Follow up with your PCP in a week   Increase activity slowly   Complete by: As directed       Allergies as of 11/17/2021   No Known Allergies      Medication List     STOP taking these medications    insulin glargine 100 UNIT/ML Solostar Pen Commonly known as: LANTUS   Pen Needles 3/16" 31G X 5 MM Misc   sitaGLIPtin 100 MG tablet Commonly known as: JANUVIA       TAKE these medications    acetaminophen 325 MG tablet Commonly known as: TYLENOL Take 325-650 mg by mouth  every 6 (six) hours as needed for moderate pain or headache.   Actemra ACTPen 162 MG/0.9ML Soaj Generic drug: Tocilizumab Inject 162 mg into the skin once a week.   amLODipine 10 MG tablet Commonly known as: NORVASC Take 1 tablet (10 mg total) by mouth daily. What changed: how much to take   atorvastatin 40 MG tablet Commonly  known as: LIPITOR Take 1 tablet (40 mg total) by mouth daily.   clopidogrel 75 MG tablet Commonly known as: PLAVIX To be restarted in 1 month when course of Aspirin and Brillinta has completed   donepezil 5 MG tablet Commonly known as: ARICEPT Take 5 mg by mouth at bedtime.   dorzolamide 2 % ophthalmic solution Commonly known as: TRUSOPT Place 1 drop into both eyes 2 (two) times daily.   escitalopram 10 MG tablet Commonly known as: LEXAPRO Take 10 mg by mouth daily. What changed: Another medication with the same name was removed. Continue taking this medication, and follow the directions you see here.   HYDROcodone-acetaminophen 5-325 MG tablet Commonly known as: NORCO/VICODIN Take 1 tablet by mouth every 6 (six) hours as needed for moderate pain or severe pain.   lidocaine 5 % Commonly known as: Lidoderm Place 1 patch onto the skin daily. Remove & Discard patch within 12 hours or as directed by MD   losartan 25 MG tablet Commonly known as: COZAAR Take 25 mg by mouth daily.   melatonin 5 MG Tabs Take 5 mg by mouth at bedtime.   metFORMIN 1000 MG tablet Commonly known as: GLUCOPHAGE Take 500 mg by mouth 2 (two) times daily with a meal.   metoprolol succinate 25 MG 24 hr tablet Commonly known as: TOPROL-XL Take 1 tablet (25 mg total) by mouth daily.   multivitamin tablet Take 1 tablet by mouth daily.   predniSONE 1 MG tablet Commonly known as: DELTASONE Take in combination with 5 mg tablets for total daily dose as directed What changed:  how much to take how to take this when to take this additional instructions Another medication with the same name was removed. Continue taking this medication, and follow the directions you see here.               Durable Medical Equipment  (From admission, onward)           Start     Ordered   11/16/21 1556  For home use only DME Hospital bed  Once       Question Answer Comment  Length of Need Lifetime   Bed  type Semi-electric      11/16/21 1555            Follow-up Information     Sharilyn Sites, MD. Schedule an appointment as soon as possible for a visit in 1 week(s).   Specialty: Family Medicine Contact information: [address] [place] [address] [phone]                No Known Allergies  Consultations: None   Procedures/Studies: CT Chest W Contrast  Result Date: 11/15/2021 CLINICAL DATA:  Chest trauma EXAM: CT CHEST WITH CONTRAST TECHNIQUE: Multidetector CT imaging of the chest was performed during intravenous contrast administration. RADIATION DOSE REDUCTION: This exam was performed according to the departmental dose-optimization program which includes automated exposure control, adjustment of the mA and/or kV according to patient size and/or use of iterative reconstruction technique. CONTRAST:  64m OMNIPAQUE IOHEXOL 300 MG/ML  SOLN COMPARISON:  Same day rib radiograph  FINDINGS: Cardiovascular: Normal heart size. No pericardial effusion. Aortic valve calcifications. Normal caliber thoracic aorta with moderate atherosclerotic disease. Suspicious filling defects of the central pulmonary arteries. Moderate three-vessel coronary artery calcifications. Mediastinum/Nodes: Esophagus is unremarkable. Exophytic nodule of the right lobe of the thyroid measuring 2.0 cm. No pathologically enlarged lymph nodes seen in the chest. Lungs/Pleura: Central airways are patent. No consolidation or pneumothorax. Trace right pleural effusion. Upper Abdomen: No acute abnormality. Musculoskeletal: Mildly displaced fractures of the posterior right 10th and 11th ribs and nondisplaced fractures of the right lateral 7th-9th ribs. IMPRESSION: 1. No evidence of pneumothorax. 2. Trace right pleural effusion, suspicious for hemothorax given associated rib fractures. 3. Mildly displaced fractures of the posterior right 10th and 11th ribs and nondisplaced fractures of the right lateral 7th-9th  ribs. 4. Right thyroid nodule measuring 2.0 cm. Recommend nonemergent thyroid US if clinically warranted given patient age. Reference: J Am Coll Radiol. 2015 Feb;12(2): 143-50 5. Aortic valve calcifications, which can be seen in the setting of aortic stenosis or sclerosis. Correlate with echocardiography. 6. Moderate three-vessel coronary artery calcifications. 7.  Aortic Atherosclerosis (ICD10-I70.0). Electronically Signed   By: Yetta Glassman M.D.   On: 11/15/2021 14:56   DG Ribs Unilateral W/Chest Right  Result Date: 11/15/2021 CLINICAL DATA:  Fall.  Right rib pain EXAM: RIGHT RIBS AND CHEST - 3+ VIEW COMPARISON:  Chest 03/24/2021 FINDINGS: Mild cardiac enlargement. Vascularity normal. Atherosclerotic calcification aortic arch Lungs clear without infiltrate or effusion.  No pneumothorax Nondisplaced fractures right seventh, eighth, ninth ribs IMPRESSION: Nondisplaced fractures right seventh, eighth, ninth ribs No acute cardiopulmonary abnormality. Electronically Signed   By: Franchot Gallo M.D.   On: 11/15/2021 12:59   CT Head Wo Contrast  Result Date: 11/15/2021 CLINICAL DATA:  Fall.  Head trauma. EXAM: CT HEAD WITHOUT CONTRAST CT CERVICAL SPINE WITHOUT CONTRAST TECHNIQUE: Multidetector CT imaging of the head and cervical spine was performed following the standard protocol without intravenous contrast. Multiplanar CT image reconstructions of the cervical spine were also generated. RADIATION DOSE REDUCTION: This exam was performed according to the departmental dose-optimization program which includes automated exposure control, adjustment of the mA and/or kV according to patient size and/or use of iterative reconstruction technique. COMPARISON:  None Available. FINDINGS: CT HEAD FINDINGS Brain: No evidence of acute infarction, hemorrhage, hydrocephalus, extra-axial collection or mass lesion/mass effect. There is mild diffuse low-attenuation within the subcortical and periventricular white matter  compatible with chronic microvascular disease. Prominence of the sulci and ventricles compatible with atrophy. Vascular: No hyperdense vessel or unexpected calcification. Skull: Normal. Negative for fracture or focal lesion. Sinuses/Orbits: Paranasal sinuses and mastoid air cells are clear. Other: None. CT CERVICAL SPINE FINDINGS Alignment: Normal alignment. Skull base and vertebrae: No acute fracture. No primary bone lesion or focal pathologic process. Soft tissues and spinal canal: No prevertebral fluid or swelling. No visible canal hematoma. Disc levels:  Multilevel degenerative disc disease. Upper chest: Negative. Other: None IMPRESSION: 1. No acute intracranial abnormality. 2. Chronic microvascular disease and atrophy. 3. No evidence for cervical spine fracture or subluxation. 4. Multilevel degenerative disc disease. Electronically Signed   By: Kerby Moors M.D.   On: 11/15/2021 12:57   CT Cervical Spine Wo Contrast  Result Date: 11/15/2021 CLINICAL DATA:  Fall.  Head trauma. EXAM: CT HEAD WITHOUT CONTRAST CT CERVICAL SPINE WITHOUT CONTRAST TECHNIQUE: Multidetector CT imaging of the head and cervical spine was performed following the standard protocol without intravenous contrast. Multiplanar CT image reconstructions of the cervical spine were also generated.  RADIATION DOSE REDUCTION: This exam was performed according to the departmental dose-optimization program which includes automated exposure control, adjustment of the mA and/or kV according to patient size and/or use of iterative reconstruction technique. COMPARISON:  None Available. FINDINGS: CT HEAD FINDINGS Brain: No evidence of acute infarction, hemorrhage, hydrocephalus, extra-axial collection or mass lesion/mass effect. There is mild diffuse low-attenuation within the subcortical and periventricular white matter compatible with chronic microvascular disease. Prominence of the sulci and ventricles compatible with atrophy. Vascular: No hyperdense  vessel or unexpected calcification. Skull: Normal. Negative for fracture or focal lesion. Sinuses/Orbits: Paranasal sinuses and mastoid air cells are clear. Other: None. CT CERVICAL SPINE FINDINGS Alignment: Normal alignment. Skull base and vertebrae: No acute fracture. No primary bone lesion or focal pathologic process. Soft tissues and spinal canal: No prevertebral fluid or swelling. No visible canal hematoma. Disc levels:  Multilevel degenerative disc disease. Upper chest: Negative. Other: None IMPRESSION: 1. No acute intracranial abnormality. 2. Chronic microvascular disease and atrophy. 3. No evidence for cervical spine fracture or subluxation. 4. Multilevel degenerative disc disease. Electronically Signed   By: Kerby Moors M.D.   On: 11/15/2021 12:57      Subjective: Patient seen and examined at bedside today.  Hemodynamically stable for discharge.I called the daughter and discussed about discharge planning  Discharge Exam: Vitals:   11/16/21 2205 11/17/21 0512  BP: (!) 165/78 (!) 164/69  Pulse:  60  Resp:  18  Temp:  97.9 F (36.6 C)  SpO2:  96%   Vitals:   11/16/21 1321 11/16/21 2055 11/16/21 2205 11/17/21 0512  BP: (!) 147/64 (!) 198/80 (!) 165/78 (!) 164/69  Pulse: 67 (!) 59  60  Resp: '15 18  18  '$ Temp: 98.6 F (37 C) 98.3 F (36.8 C)  97.9 F (36.6 C)  TempSrc: Oral   Oral  SpO2: 95% 99%  96%  Weight:      Height:        General: Pt is alert, awake, not in acute distress Cardiovascular: RRR, S1/S2 +, no rubs, no gallops Respiratory: CTA bilaterally, no wheezing, no rhonchi Abdominal: Soft, NT, ND, bowel sounds + Extremities: no edema, no cyanosis    The results of significant diagnostics from this hospitalization (including imaging, microbiology, ancillary and laboratory) are listed below for reference.     Microbiology: No results found for this or any previous visit (from the past 240 hour(s)).   Labs: BNP (last 3 results) No results for input(s): "BNP"  in the last 8760 hours. Basic Metabolic Panel: Recent Labs  Lab 11/15/21 1150 11/17/21 0612  NA 139 142  K 4.7 3.6  CL 104 107  CO2 24 28  GLUCOSE 223* 164*  BUN 19 28*  CREATININE 0.85 1.01*  CALCIUM 9.3 9.2   Liver Function Tests: No results for input(s): "AST", "ALT", "ALKPHOS", "BILITOT", "PROT", "ALBUMIN" in the last 168 hours. No results for input(s): "LIPASE", "AMYLASE" in the last 168 hours. No results for input(s): "AMMONIA" in the last 168 hours. CBC: Recent Labs  Lab 11/15/21 1335 11/16/21 0454 11/17/21 0612  WBC 5.4 3.9* 4.3  NEUTROABS 4.7  --   --   HGB 13.1 12.9 12.5  HCT 37.3 37.6 36.1  MCV 98.4 99.5 99.2  PLT 155 163 162   Cardiac Enzymes: No results for input(s): "CKTOTAL", "CKMB", "CKMBINDEX", "TROPONINI" in the last 168 hours. BNP: Invalid input(s): "POCBNP" CBG: Recent Labs  Lab 11/16/21 1129 11/16/21 1631 11/16/21 2057 11/16/21 2146 11/17/21 0747  GLUCAP 230* 322*  119* 111* 164*   D-Dimer No results for input(s): "DDIMER" in the last 72 hours. Hgb A1c Recent Labs    11/15/21 1335  HGBA1C 6.3*   Lipid Profile No results for input(s): "CHOL", "HDL", "LDLCALC", "TRIG", "CHOLHDL", "LDLDIRECT" in the last 72 hours. Thyroid function studies No results for input(s): "TSH", "T4TOTAL", "T3FREE", "THYROIDAB" in the last 72 hours.  Invalid input(s): "FREET3" Anemia work up No results for input(s): "VITAMINB12", "FOLATE", "FERRITIN", "TIBC", "IRON", "RETICCTPCT" in the last 72 hours. Urinalysis    Component Value Date/Time   COLORURINE YELLOW 11/15/2021 1540   APPEARANCEUR CLEAR 11/15/2021 1540   LABSPEC 1.011 11/15/2021 1540   PHURINE 7.0 11/15/2021 1540   GLUCOSEU NEGATIVE 11/15/2021 1540   HGBUR NEGATIVE 11/15/2021 1540   BILIRUBINUR NEGATIVE 11/15/2021 1540   KETONESUR 5 (A) 11/15/2021 1540   PROTEINUR NEGATIVE 11/15/2021 1540   NITRITE POSITIVE (A) 11/15/2021 1540   LEUKOCYTESUR NEGATIVE 11/15/2021 1540   Sepsis Labs Recent  Labs  Lab 11/15/21 1335 11/16/21 0454 11/17/21 0612  WBC 5.4 3.9* 4.3   Microbiology No results found for this or any previous visit (from the past 240 hour(s)).  Please note: You were cared for by a hospitalist during your hospital stay. Once you are discharged, your primary care physician will handle any further medical issues. Please note that NO REFILLS for any discharge medications will be authorized once you are discharged, as it is imperative that you return to your primary care physician (or establish a relationship with a primary care physician if you do not have one) for your post hospital discharge needs so that they can reassess your need for medications and monitor your lab values.    Time coordinating discharge: 40 minutes  SIGNED:   Shelly Coss, MD  [facility] 11/17/2021, 10:21 AM Pager [phone]  If 7PM-7AM, please contact night-coverage [email or web address] Password [email or web address]

## 2021-11-21 DIAGNOSIS — N390 Urinary tract infection, site not specified: Secondary | ICD-10-CM | POA: Diagnosis not present

## 2021-11-21 DIAGNOSIS — M316 Other giant cell arteritis: Secondary | ICD-10-CM | POA: Diagnosis not present

## 2021-11-21 DIAGNOSIS — Z6822 Body mass index (BMI) 22.0-22.9, adult: Secondary | ICD-10-CM | POA: Diagnosis not present

## 2021-11-21 DIAGNOSIS — I69318 Other symptoms and signs involving cognitive functions following cerebral infarction: Secondary | ICD-10-CM | POA: Diagnosis not present

## 2021-11-21 DIAGNOSIS — W19XXXA Unspecified fall, initial encounter: Secondary | ICD-10-CM | POA: Diagnosis not present

## 2021-11-21 DIAGNOSIS — I69354 Hemiplegia and hemiparesis following cerebral infarction affecting left non-dominant side: Secondary | ICD-10-CM | POA: Diagnosis not present

## 2021-11-21 DIAGNOSIS — Z23 Encounter for immunization: Secondary | ICD-10-CM | POA: Diagnosis not present

## 2021-11-21 DIAGNOSIS — S2249XA Multiple fractures of ribs, unspecified side, initial encounter for closed fracture: Secondary | ICD-10-CM | POA: Diagnosis not present

## 2021-11-21 DIAGNOSIS — I6930 Unspecified sequelae of cerebral infarction: Secondary | ICD-10-CM | POA: Diagnosis not present

## 2021-11-21 DIAGNOSIS — M353 Polymyalgia rheumatica: Secondary | ICD-10-CM | POA: Diagnosis not present

## 2021-11-28 DIAGNOSIS — M316 Other giant cell arteritis: Secondary | ICD-10-CM | POA: Diagnosis not present

## 2021-11-28 DIAGNOSIS — I10 Essential (primary) hypertension: Secondary | ICD-10-CM | POA: Diagnosis not present

## 2021-12-04 ENCOUNTER — Telehealth: Payer: Self-pay | Admitting: *Deleted

## 2021-12-04 NOTE — Telephone Encounter (Signed)
Patient's daughter contacted the office stating they have not received the Actemra yet. She states she was on the phone 2 hours and hung up. Patient's daughter advised that she would need to reach out to them to set up shipment. She states that [place] fell and broke 4 ribs and has declined since the fall. Butch Penny states she is going to stop all of her medications we prescribe as they are putting her on palliative care. She cancelled the patient's follow up appointment.

## 2021-12-17 DIAGNOSIS — R4182 Altered mental status, unspecified: Secondary | ICD-10-CM | POA: Diagnosis not present

## 2021-12-17 DIAGNOSIS — Z79899 Other long term (current) drug therapy: Secondary | ICD-10-CM | POA: Diagnosis not present

## 2021-12-17 DIAGNOSIS — S2249XA Multiple fractures of ribs, unspecified side, initial encounter for closed fracture: Secondary | ICD-10-CM | POA: Diagnosis not present

## 2021-12-17 DIAGNOSIS — I63522 Cerebral infarction due to unspecified occlusion or stenosis of left anterior cerebral artery: Secondary | ICD-10-CM | POA: Diagnosis not present

## 2021-12-19 DIAGNOSIS — I509 Heart failure, unspecified: Secondary | ICD-10-CM | POA: Diagnosis not present

## 2022-01-02 ENCOUNTER — Ambulatory Visit: Payer: PPO | Admitting: Internal Medicine

## 2022-03-22 DEATH — deceased
